# Patient Record
Sex: Male | Born: 1945
Health system: Southern US, Community
[De-identification: ages and names within clinical notes are randomized; demographics above are authoritative.]

## PROBLEM LIST (undated history)

## (undated) DIAGNOSIS — I4821 Permanent atrial fibrillation: Secondary | ICD-10-CM

## (undated) DIAGNOSIS — E785 Hyperlipidemia, unspecified: Secondary | ICD-10-CM

## (undated) DIAGNOSIS — I2584 Coronary atherosclerosis due to calcified coronary lesion: Secondary | ICD-10-CM

## (undated) DIAGNOSIS — I10 Essential (primary) hypertension: Secondary | ICD-10-CM

## (undated) DIAGNOSIS — I700 Atherosclerosis of aorta: Secondary | ICD-10-CM

## (undated) DIAGNOSIS — G40909 Epilepsy, unspecified, not intractable, without status epilepticus: Secondary | ICD-10-CM

## (undated) DIAGNOSIS — I639 Cerebral infarction, unspecified: Secondary | ICD-10-CM

## (undated) DIAGNOSIS — N183 Chronic kidney disease, stage 3 unspecified: Secondary | ICD-10-CM

## (undated) DIAGNOSIS — I2510 Atherosclerotic heart disease of native coronary artery without angina pectoris: Secondary | ICD-10-CM

## (undated) DIAGNOSIS — M1990 Unspecified osteoarthritis, unspecified site: Secondary | ICD-10-CM

## (undated) HISTORY — PX: TOTAL KNEE ARTHROPLASTY: SHX125

## (undated) HISTORY — DX: Cerebral infarction, unspecified: I63.9

## (undated) HISTORY — DX: Unspecified osteoarthritis, unspecified site: M19.90

## (undated) HISTORY — DX: Permanent atrial fibrillation: I48.21

## (undated) HISTORY — DX: Atherosclerotic heart disease of native coronary artery without angina pectoris: I25.10

## (undated) HISTORY — DX: Chronic kidney disease, stage 3 (moderate): N18.3

## (undated) HISTORY — DX: Chronic kidney disease, stage 3 unspecified: N18.30

## (undated) HISTORY — DX: Hyperlipidemia, unspecified: E78.5

## (undated) HISTORY — DX: Epilepsy, unspecified, not intractable, without status epilepticus: G40.909

## (undated) HISTORY — DX: Atherosclerosis of aorta: I70.0

## (undated) HISTORY — DX: Essential (primary) hypertension: I10

## (undated) HISTORY — DX: Coronary atherosclerosis due to calcified coronary lesion: I25.84

---

## 2000-08-06 ENCOUNTER — Other Ambulatory Visit: Admission: RE | Admit: 2000-08-06 | Discharge: 2000-08-06 | Payer: Self-pay | Admitting: General Surgery

## 2000-08-07 ENCOUNTER — Ambulatory Visit (HOSPITAL_COMMUNITY): Admission: RE | Admit: 2000-08-07 | Discharge: 2000-08-07 | Payer: Self-pay | Admitting: Internal Medicine

## 2000-08-07 ENCOUNTER — Encounter: Payer: Self-pay | Admitting: Internal Medicine

## 2000-08-28 ENCOUNTER — Other Ambulatory Visit: Admission: RE | Admit: 2000-08-28 | Discharge: 2000-08-28 | Payer: Self-pay | Admitting: General Surgery

## 2002-05-05 ENCOUNTER — Encounter: Payer: Self-pay | Admitting: Orthopaedic Surgery

## 2002-05-05 ENCOUNTER — Ambulatory Visit (HOSPITAL_COMMUNITY): Admission: RE | Admit: 2002-05-05 | Discharge: 2002-05-05 | Payer: Self-pay | Admitting: Orthopaedic Surgery

## 2002-07-10 ENCOUNTER — Ambulatory Visit (HOSPITAL_COMMUNITY): Admission: RE | Admit: 2002-07-10 | Discharge: 2002-07-10 | Payer: Self-pay | Admitting: Orthopaedic Surgery

## 2003-10-18 ENCOUNTER — Emergency Department (HOSPITAL_COMMUNITY): Admission: EM | Admit: 2003-10-18 | Discharge: 2003-10-18 | Payer: Self-pay

## 2004-04-05 ENCOUNTER — Ambulatory Visit (HOSPITAL_COMMUNITY): Admission: RE | Admit: 2004-04-05 | Discharge: 2004-04-05 | Payer: Self-pay | Admitting: Orthopaedic Surgery

## 2005-01-31 ENCOUNTER — Ambulatory Visit (HOSPITAL_COMMUNITY): Admission: RE | Admit: 2005-01-31 | Discharge: 2005-01-31 | Payer: Self-pay | Admitting: Family Medicine

## 2005-02-08 ENCOUNTER — Ambulatory Visit (HOSPITAL_COMMUNITY): Admission: RE | Admit: 2005-02-08 | Discharge: 2005-02-08 | Payer: Self-pay | Admitting: Family Medicine

## 2005-05-05 ENCOUNTER — Ambulatory Visit (HOSPITAL_COMMUNITY): Admission: RE | Admit: 2005-05-05 | Discharge: 2005-05-05 | Payer: Self-pay | Admitting: Orthopaedic Surgery

## 2005-05-18 ENCOUNTER — Encounter: Admission: RE | Admit: 2005-05-18 | Discharge: 2005-05-18 | Payer: Self-pay | Admitting: Orthopaedic Surgery

## 2005-05-29 ENCOUNTER — Encounter (HOSPITAL_COMMUNITY): Admission: RE | Admit: 2005-05-29 | Discharge: 2005-06-28 | Payer: Self-pay | Admitting: Orthopaedic Surgery

## 2005-06-09 ENCOUNTER — Encounter: Admission: RE | Admit: 2005-06-09 | Discharge: 2005-06-09 | Payer: Self-pay | Admitting: Orthopaedic Surgery

## 2006-01-01 ENCOUNTER — Ambulatory Visit: Payer: Self-pay | Admitting: Orthopedic Surgery

## 2006-01-18 ENCOUNTER — Ambulatory Visit: Payer: Self-pay | Admitting: Orthopedic Surgery

## 2006-04-02 ENCOUNTER — Ambulatory Visit: Payer: Self-pay | Admitting: Orthopedic Surgery

## 2006-07-04 ENCOUNTER — Ambulatory Visit: Payer: Self-pay | Admitting: Orthopedic Surgery

## 2006-10-31 DIAGNOSIS — E119 Type 2 diabetes mellitus without complications: Secondary | ICD-10-CM | POA: Insufficient documentation

## 2006-11-01 ENCOUNTER — Ambulatory Visit: Payer: Self-pay | Admitting: Orthopedic Surgery

## 2006-12-05 ENCOUNTER — Ambulatory Visit: Payer: Self-pay | Admitting: Orthopedic Surgery

## 2006-12-05 DIAGNOSIS — M5430 Sciatica, unspecified side: Secondary | ICD-10-CM | POA: Insufficient documentation

## 2006-12-05 DIAGNOSIS — M1710 Unilateral primary osteoarthritis, unspecified knee: Secondary | ICD-10-CM | POA: Insufficient documentation

## 2006-12-05 DIAGNOSIS — M25569 Pain in unspecified knee: Secondary | ICD-10-CM | POA: Insufficient documentation

## 2007-06-17 ENCOUNTER — Ambulatory Visit: Payer: Self-pay | Admitting: Orthopedic Surgery

## 2007-06-17 DIAGNOSIS — M25469 Effusion, unspecified knee: Secondary | ICD-10-CM | POA: Insufficient documentation

## 2007-07-22 ENCOUNTER — Ambulatory Visit: Payer: Self-pay | Admitting: Orthopedic Surgery

## 2007-07-24 ENCOUNTER — Encounter: Payer: Self-pay | Admitting: Orthopedic Surgery

## 2007-07-25 ENCOUNTER — Encounter: Payer: Self-pay | Admitting: Orthopedic Surgery

## 2007-07-29 ENCOUNTER — Encounter: Payer: Self-pay | Admitting: Orthopedic Surgery

## 2007-07-30 ENCOUNTER — Ambulatory Visit: Payer: Self-pay | Admitting: Orthopedic Surgery

## 2007-07-30 ENCOUNTER — Inpatient Hospital Stay (HOSPITAL_COMMUNITY): Admission: RE | Admit: 2007-07-30 | Discharge: 2007-08-02 | Payer: Self-pay | Admitting: Orthopedic Surgery

## 2007-07-30 ENCOUNTER — Telehealth: Payer: Self-pay | Admitting: Orthopedic Surgery

## 2007-08-02 ENCOUNTER — Encounter: Payer: Self-pay | Admitting: Orthopedic Surgery

## 2007-08-05 ENCOUNTER — Encounter: Payer: Self-pay | Admitting: Orthopedic Surgery

## 2007-08-05 ENCOUNTER — Telehealth: Payer: Self-pay | Admitting: Orthopedic Surgery

## 2007-08-06 ENCOUNTER — Encounter: Payer: Self-pay | Admitting: Orthopedic Surgery

## 2007-08-07 ENCOUNTER — Telehealth: Payer: Self-pay | Admitting: Orthopedic Surgery

## 2007-08-08 ENCOUNTER — Encounter: Payer: Self-pay | Admitting: Orthopedic Surgery

## 2007-08-12 ENCOUNTER — Encounter: Payer: Self-pay | Admitting: Orthopedic Surgery

## 2007-08-13 ENCOUNTER — Ambulatory Visit: Payer: Self-pay | Admitting: Orthopedic Surgery

## 2007-08-13 DIAGNOSIS — Z96659 Presence of unspecified artificial knee joint: Secondary | ICD-10-CM | POA: Insufficient documentation

## 2007-08-14 ENCOUNTER — Telehealth: Payer: Self-pay | Admitting: Orthopedic Surgery

## 2007-08-15 ENCOUNTER — Encounter: Payer: Self-pay | Admitting: Orthopedic Surgery

## 2007-09-12 ENCOUNTER — Ambulatory Visit: Payer: Self-pay | Admitting: Orthopedic Surgery

## 2007-10-01 ENCOUNTER — Encounter: Payer: Self-pay | Admitting: Orthopedic Surgery

## 2007-10-01 ENCOUNTER — Telehealth: Payer: Self-pay | Admitting: Orthopedic Surgery

## 2007-10-24 ENCOUNTER — Ambulatory Visit: Payer: Self-pay | Admitting: Orthopedic Surgery

## 2007-11-13 ENCOUNTER — Encounter: Payer: Self-pay | Admitting: Orthopedic Surgery

## 2007-11-15 ENCOUNTER — Telehealth: Payer: Self-pay | Admitting: Orthopedic Surgery

## 2008-01-22 ENCOUNTER — Ambulatory Visit: Payer: Self-pay | Admitting: Orthopedic Surgery

## 2008-10-14 ENCOUNTER — Ambulatory Visit: Payer: Self-pay | Admitting: Orthopedic Surgery

## 2008-10-22 ENCOUNTER — Encounter: Payer: Self-pay | Admitting: Orthopedic Surgery

## 2009-03-10 ENCOUNTER — Ambulatory Visit: Payer: Self-pay | Admitting: Orthopedic Surgery

## 2009-07-12 ENCOUNTER — Ambulatory Visit: Payer: Self-pay | Admitting: Orthopedic Surgery

## 2009-07-12 ENCOUNTER — Encounter (INDEPENDENT_AMBULATORY_CARE_PROVIDER_SITE_OTHER): Payer: Self-pay | Admitting: *Deleted

## 2009-07-12 DIAGNOSIS — M1710 Unilateral primary osteoarthritis, unspecified knee: Secondary | ICD-10-CM

## 2009-07-12 DIAGNOSIS — IMO0002 Reserved for concepts with insufficient information to code with codable children: Secondary | ICD-10-CM | POA: Insufficient documentation

## 2009-07-15 ENCOUNTER — Encounter (INDEPENDENT_AMBULATORY_CARE_PROVIDER_SITE_OTHER): Payer: Self-pay | Admitting: *Deleted

## 2009-07-20 ENCOUNTER — Encounter (INDEPENDENT_AMBULATORY_CARE_PROVIDER_SITE_OTHER): Payer: Self-pay | Admitting: *Deleted

## 2009-07-23 ENCOUNTER — Encounter: Payer: Self-pay | Admitting: Orthopedic Surgery

## 2009-07-23 ENCOUNTER — Telehealth: Payer: Self-pay | Admitting: Orthopedic Surgery

## 2009-07-23 ENCOUNTER — Encounter (INDEPENDENT_AMBULATORY_CARE_PROVIDER_SITE_OTHER): Payer: Self-pay | Admitting: *Deleted

## 2009-08-05 ENCOUNTER — Encounter: Payer: Self-pay | Admitting: Orthopedic Surgery

## 2009-08-09 ENCOUNTER — Encounter: Payer: Self-pay | Admitting: Orthopedic Surgery

## 2009-08-10 ENCOUNTER — Inpatient Hospital Stay (HOSPITAL_COMMUNITY): Admission: RE | Admit: 2009-08-10 | Discharge: 2009-08-13 | Payer: Self-pay | Admitting: Orthopedic Surgery

## 2009-08-10 ENCOUNTER — Ambulatory Visit: Payer: Self-pay | Admitting: Orthopedic Surgery

## 2009-08-11 ENCOUNTER — Encounter: Payer: Self-pay | Admitting: Orthopedic Surgery

## 2009-08-16 ENCOUNTER — Telehealth: Payer: Self-pay | Admitting: Orthopedic Surgery

## 2009-08-19 ENCOUNTER — Telehealth: Payer: Self-pay | Admitting: Orthopedic Surgery

## 2009-08-24 ENCOUNTER — Ambulatory Visit: Payer: Self-pay | Admitting: Orthopedic Surgery

## 2009-08-30 ENCOUNTER — Encounter: Payer: Self-pay | Admitting: Orthopedic Surgery

## 2009-09-01 ENCOUNTER — Ambulatory Visit: Payer: Self-pay | Admitting: Orthopedic Surgery

## 2009-09-06 ENCOUNTER — Telehealth: Payer: Self-pay | Admitting: Orthopedic Surgery

## 2009-09-20 ENCOUNTER — Ambulatory Visit: Payer: Self-pay | Admitting: Orthopedic Surgery

## 2009-09-28 ENCOUNTER — Telehealth: Payer: Self-pay | Admitting: Orthopedic Surgery

## 2009-10-05 ENCOUNTER — Encounter: Payer: Self-pay | Admitting: Orthopedic Surgery

## 2009-10-11 ENCOUNTER — Ambulatory Visit: Payer: Self-pay | Admitting: Orthopedic Surgery

## 2009-10-12 ENCOUNTER — Encounter (INDEPENDENT_AMBULATORY_CARE_PROVIDER_SITE_OTHER): Payer: Self-pay | Admitting: *Deleted

## 2009-10-13 ENCOUNTER — Encounter: Payer: Self-pay | Admitting: Orthopedic Surgery

## 2009-10-18 ENCOUNTER — Encounter: Payer: Self-pay | Admitting: Orthopedic Surgery

## 2009-11-04 ENCOUNTER — Ambulatory Visit: Payer: Self-pay | Admitting: Orthopedic Surgery

## 2009-11-26 ENCOUNTER — Encounter (INDEPENDENT_AMBULATORY_CARE_PROVIDER_SITE_OTHER): Payer: Self-pay | Admitting: *Deleted

## 2009-11-29 ENCOUNTER — Telehealth: Payer: Self-pay | Admitting: Orthopedic Surgery

## 2009-12-01 ENCOUNTER — Encounter: Payer: Self-pay | Admitting: Orthopedic Surgery

## 2010-02-03 ENCOUNTER — Ambulatory Visit
Admission: RE | Admit: 2010-02-03 | Discharge: 2010-02-03 | Payer: Self-pay | Source: Home / Self Care | Attending: Orthopedic Surgery | Admitting: Orthopedic Surgery

## 2010-02-20 ENCOUNTER — Encounter: Payer: Self-pay | Admitting: Orthopaedic Surgery

## 2010-03-01 NOTE — Miscellaneous (Signed)
Summary: voltaren gel requires ins. authorization  Clinical Lists Changes   I called insurance, case number is [id number], they will be faxing info for dr to fill out for Voltaren gel  Rx was in fax today 11/29/09.

## 2010-03-01 NOTE — Miscellaneous (Signed)
Summary: pre-cert information in-patient surgery   Clinical Lists Changes  Contact to insurer [employer] re: in-patient surgery scheduled 08/10/09, total knee arthroplasty CPT 27447, ICD9 dx 715.16, 715.96.  Spoke w/Teresa B - transferred to [facility] - reviewed; Pre-approved with Auth # [id number].  Clinicals would be needed only if in-patient for >7 days.

## 2010-03-01 NOTE — Progress Notes (Signed)
Summary: call from therapist / Home physical therapy frequency note  Phone Note Call from Patient   Summary of Call: Brendan Scott from [place] called to relay that she does home therapy for [name] on the weekends.  States saw Mr. Reesor and set his frequency up to 5X per week X2 wks, then 3X per week X2 wks, due to" knee being really tight."  If any questions or additional information, her direct ph# is [phone]. Initial call taken by: Cammie Sickle,  August 16, 2009 2:29 PM

## 2010-03-01 NOTE — Assessment & Plan Note (Signed)
Summary: 3 WK RE-CK/XRAY/LT TKA 08/10/09/BCBS/CAF   Visit Type:  post op  Referring Provider:  self  CC:  left total knee replacement.  History of Present Illness: I saw Brendan Scott in the office today for a 3 week  followup visit.  He is a 65 years old man with the complaint of:  left knee replacement  Xrays today.  DOS 08-10-09. Left total knee replacement.  Medications:  Oxycodone 5  1 every 4 hours , Robaxin 500mg . Needs refills.  He states he has some soreness and some shooting pains. he is fine during the day he has increased pain at night an occasional sharp pain run through the front of the knee  His surgery was contacted by posterior cortical fracture the tibia and tilting of his tibial tray.  He is currently about 92 of flexion with a 8 flexion contracture on the LEFT whereas his RIGHT total knee replacement has 108 of flexion and full extension  I think this cortical fracture and tilting is preventing his motion and giving him soreness and pain.  I will like to give it another month to see where he settles out before recommending any further intervention such as a long stem revision of the tibial tray with appropriate tilt.  Followup in one month no x-ray needed   Allergies: No Known Drug Allergies   Impression & Recommendations:  Problem # 1:  TOTAL KNEE FOLLOW-UP (ICD-V43.65) Assessment Improved  AP lateral and patellofemoral view LEFT knee  The tibial tray is tilted with posterior cortical breakthrough otherwise alignment is normal  Cement extravasation noted in the posterior soft tissues of the gastroc area.  Orders: Post-Op Check (69629) Knee x-ray,  3 views (52841)  Problem # 2:  OSTEOARTHRITIS, KNEE, LEFT (ICD-715.96) Assessment: Improved  His updated medication list for this problem includes:    Vicodin 5-500 Mg Tabs (Hydrocodone-acetaminophen) .Marland Kitchen... 1 by mouth q 6 prn    Diclofenac Sodium 50 Mg Tbec (Diclofenac sodium) .Marland Kitchen... 1 by mouth two  times a day    Oxycodone-acetaminophen 5-325 Mg Tabs (Oxycodone-acetaminophen) .Marland Kitchen... 1 q 4 as needed pain    Robaxin 500 Mg Tabs (Methocarbamol) .Marland Kitchen... 1 by mouth tid    Celebrex 200 Mg Caps (Celecoxib) .Marland Kitchen... 1 by mouth qd  Orders: Post-Op Check (32440) Knee x-ray,  3 views (10272)  Patient Instructions: 1)  Please schedule a follow-up appointment in 1 month. Prescriptions: CELEBREX 200 MG CAPS (CELECOXIB) 1 by mouth qd  #30 x 2   Entered and Authorized by:   Fuller Canada MD   Signed by:   Fuller Canada MD on 10/11/2009   Method used:   Print then Give to Patient   RxID:   [id number] OXYCODONE-ACETAMINOPHEN 5-325 MG TABS (OXYCODONE-ACETAMINOPHEN) 1 q 4 as needed pain  #90 x 0   Entered and Authorized by:   Fuller Canada MD   Signed by:   Fuller Canada MD on 10/11/2009   Method used:   Print then Give to Patient   RxID:   [id number]

## 2010-03-01 NOTE — Miscellaneous (Signed)
Visit Type:  Follow-up Referring Provider:  self  CC:  left knee pain .  History of Present Illness: I saw Brendan Scott in the office today for a followup visit.  He is a 65 years old man with the complaint of:  DX: Left knee pain, OA fluid.  Treatment: Aspirations injections 10/14/08 last aspiration and injection.  MEDS:  Vicodin 5 and Diclofenac, needs refills on both.  Complaints: fluid left knee.  Today, I am ready for you to do this one   This is a 65 year old male had a RIGHT total knee arthroplasty did well always knew he would have to have the LEFT one done as well.  I have aspirated and injected his knee on several occasions over the last few months.  He is also on pain medicine and anti-inflammatories.  If he wishes to have surgery on the LEFT knee now for persistent severe pain on the medial aspect of his knee.       Current Medications (verified): 1)  Diclofenac 2)  Glimepride 3)  Vicodin 5-500 Mg Tabs (Hydrocodone-Acetaminophen) .Marland Kitchen.. 1 By Mouth Q 6 Prn 4)  Diclofenac Sodium 50 Mg Tbec (Diclofenac Sodium) .Marland Kitchen.. 1 By Mouth Two Times A Day  Allergies (verified): No Known Drug Allergies  Past History:  Past Medical History: Last updated: 10/31/2006 Diabetes arthritis joint pain/knees breathing problems sometime  Past Surgical History: Last updated: 10/31/2006 knee surgery (not done by Dr. Romeo Apple ear surgery  Risk Factors: Smoking Status: never (07/22/2007)  Review of Systems Musculoskeletal:  Complains of joint pain, swelling, and stiffness.  The review of systems is negative for Constitutional, Cardiovascular, Respiratory, Gastrointestinal, Genitourinary, Neurologic, Endocrine, Psychiatric, Skin, HEENT, Immunology, and Hemoatologic.  Physical Exam  GEN : Normal appearance medium build  Patient very alert awake oriented happy pleasant  Normal sensation in his lower extremities  Normal pulse and effusion both limbs  Skin warm dry and intact  well-healed RIGHT knee incision  Lymph nodes negative both legs   gait pattern favors the LEFT leg total knee gait on the RIGHT  LEFT knee inspection reveals large joint effusion flexion of about 100  although the knee is stable he has medial joint line tenderness.  Alignment is varus strength is normal  RIGHT knee range of motion is 115 knee is stable strength is normal there is no effusion or tenderness.   The upper extremities have normal appearance, ROM, strength and stability.    Impression & Recommendations:  Problem # 1:  KNEE PAIN, LEFT (ICD-719.46) Assessment Deteriorated  His updated medication list for this problem includes:    Vicodin 5-500 Mg Tabs (Hydrocodone-acetaminophen) .Marland Kitchen... 1 by mouth q 6 prn    Diclofenac Sodium 50 Mg Tbec (Diclofenac sodium) .Marland Kitchen... 1 by mouth two times a day     Problem # 3:  OSTEOARTHRITIS, KNEE, LEFT (ICD-715.96) Assessment: Deteriorated the most recent x-ray, x-ray from about 6 months ago shows varus arthritis with near-complete loss of medial joint space.  he will have a LEFT total knee arthroplasty     Patient Instructions: 1)  Informed consent process: I have discussed the procedure with the patient. I have answered their questions. The risks of bleeding, infection, nerve and vascualr injury have been discussed. The diagnosis and reason for surgery have been explained. The patient demonstrates understanding of this discussion. Specific to this procedure risks include:  2)  Stiffness 3)  Pain 4)  Infection-can lead to further surgery and amputation 5)  blood clot and embolus 6)  DOS 08/10/09 7)  I will call you with your preop appt, [facility] is where you will go, take packet with you. 8)  Post op 1 08/24/09 9)  OOW 3 MOS

## 2010-03-01 NOTE — Letter (Signed)
Summary: [employer] disab form  [employer] disab form   Imported By: Cammie Sickle 08/06/2009 08:49:55  _____________________________________________________________________  External Attachment:    Type:   Image     Comment:   External Document

## 2010-03-01 NOTE — Letter (Signed)
Summary: [employer] disab form [facility]  [employer] dis[employer] form [facility]   Imported By: Cammie Sickle 08/11/2009 19:10:25  _____________________________________________________________________  External Attachment:    Type:   Image     Comment:   External Document

## 2010-03-01 NOTE — Progress Notes (Signed)
Summary: call from patient ?continue CPM  Phone Note Call from Patient   Caller: Patient Summary of Call: Patient called w/ questionre: CPM machine - still to continue? If so how long?  States his insurance no longer covering and self pay charge would be >$100 p/day.  His post op #1 appointment is Tues, 08/24/09.   Initial call taken by: Cammie Sickle,  August 19, 2009 9:08 AM  Follow-up for Phone Call        postop CPM unless he wants to pay for it Follow-up by: Fuller Canada MD,  August 19, 2009 9:33 AM  Additional Follow-up for Phone Call Additional follow up Details #1::        advised patient.  states he has already spoken w/[employer] and also checked w/therapist about returning CPM - they will be picking it up. Additional Follow-up by: Cammie Sickle,  August 19, 2009 12:32 PM

## 2010-03-01 NOTE — Medication Information (Signed)
Summary: Therapist, occupational for [employer] approval for Voltaren   Imported By: Jacklynn Ganong 12/01/2009 14:45:27  _____________________________________________________________________  External Attachment:    Type:   Image     Comment:   External Document

## 2010-03-01 NOTE — Letter (Signed)
Summary: Insurance authorization for [employer] authorization for [employer]   Imported By: Jacklynn Ganong 08/10/2009 15:33:38  _____________________________________________________________________  External Attachment:    Type:   Image     Comment:   External Document

## 2010-03-01 NOTE — Letter (Signed)
Summary: surgery order LT TKA  surgery order LT TKA   Imported By: Cammie Sickle 07/24/2009 13:35:58  _____________________________________________________________________  External Attachment:    Type:   Image     Comment:   External Document

## 2010-03-01 NOTE — Assessment & Plan Note (Signed)
Summary: POST OP 1/TKA LT KNEE/SURG 08/10/09/BCBS/CAF   Visit Type:  post op Referring Provider:  self  CC:  left knee.  History of Present Illness: 65 year old male postop visit #1 status post LEFT total knee replacement on July 12  Intraoperative complication of tibial perforation  Patient comes in today with range of motion and therapy 0-95 but therapist saying he is tight  Knee is slightly swollen  I was only able to get his flexion to 75  I do not see signs of infection  DOS 08-10-09. Left total knee replacement.  Medications:  Oxycodone 5  1 every 4 hours , Robaxin 500mg , Oxycodone IR 5mg . 1-2 q 4 as needed for breakthru   Allergies: No Known Drug Allergies   Impression & Recommendations:  Problem # 1:  OSTEOARTHRITIS, KNEE, LEFT (ICD-715.96)  His updated medication list for this problem includes:    Vicodin 5-500 Mg Tabs (Hydrocodone-acetaminophen) .Marland Kitchen... 1 by mouth q 6 prn    Diclofenac Sodium 50 Mg Tbec (Diclofenac sodium) .Marland Kitchen... 1 by mouth two times a day  Orders: Post-Op Check (01027)  Problem # 2:  TOTAL KNEE FOLLOW-UP (ICD-V43.65) Assessment: Comment Only  I want to see him in a week x-rays flexion is improving  Orders: Post-Op Check (25366)  Patient Instructions: 1)  Please schedule a follow-up appointment in 1 week.

## 2010-03-01 NOTE — Miscellaneous (Signed)
Summary: Brendan Scott Patient progress report  Brendan Scott Patient progress report   Imported By: Jacklynn Ganong 08/31/2009 15:10:22  _____________________________________________________________________  External Attachment:    Type:   Image     Comment:   External Document

## 2010-03-01 NOTE — Medication Information (Signed)
Summary: Prior authorization request   Prior authorization request   Imported By: Jacklynn Ganong 12/01/2009 14:47:00  _____________________________________________________________________  External Attachment:    Type:   Image     Comment:   External Document

## 2010-03-01 NOTE — Assessment & Plan Note (Signed)
Summary: 1 WK RE-CK/POST OP TKA 08/10/09/BCBS/CAF   Visit Type:  Follow-up Referring Provider:  self  CC:  post op left knee TKA.  History of Present Illness: 65 year old male postop visit 2 status post LEFT total knee replacement on July 12  Intraoperative complication of tibial perforation  Patient comes in today with range of motion and therapy 0-95 but therapist saying he is tight  DOS 08-10-09. Left total knee replacement.  Medications:  Oxycodone 5  1 every 4 hours , Robaxin 500mg , Oxycodone IR 5mg . 1-2 q 4 as needed for breakthru  Today is a one week recheck after PT, in home PT with [place].  the patient is made some improvement his flexion is now 102 he still tight.  He still has some tightness in extension but when he relaxes he has only a 3 loss of extension  He has some mild knee swelling which should improve and that will allow his motion to improve he is walking 3-400 feet.  X-ray next visit continue passive extension exercises in the seated position as well as in the prone position and exercises were explained to the patient  Allergies: No Known Drug Allergies   Impression & Recommendations:  Problem # 1:  TOTAL KNEE FOLLOW-UP (ICD-V43.65) Assessment Comment Only  Orders: Post-Op Check (01601)  Problem # 2:  OSTEOARTHRITIS, KNEE, LEFT (ICD-715.96) Assessment: Comment Only  His updated medication list for this problem includes:    Vicodin 5-500 Mg Tabs (Hydrocodone-acetaminophen) .Marland Kitchen... 1 by mouth q 6 prn    Diclofenac Sodium 50 Mg Tbec (Diclofenac sodium) .Marland Kitchen... 1 by mouth two times a day  Orders: Post-Op Check (09323)  Patient Instructions: 1)  3 weeks  2)  xrays TKA

## 2010-03-01 NOTE — Progress Notes (Signed)
Summary: wants prescription for Celebrex  Phone Note Call from Patient   Summary of Call: Brendan Scott (05/08/1945) says his left knee hurts real bad at night, like a toothache.  Asked if you will give him a prescription for Celebrex. Uses [facility] in [place] His # [phone] Initial call taken by: Jacklynn Ganong,  September 28, 2009 9:59 AM  Follow-up for Phone Call        celebrex 200 mg q h s   # 30  Follow-up by: Fuller Canada MD,  September 29, 2009 8:46 AM    New/Updated Medications: CELEBREX 200 MG CAPS (CELECOXIB) 1 by mouth qd Prescriptions: CELEBREX 200 MG CAPS (CELECOXIB) 1 by mouth qd  #30 x 2   Entered by:   Ether Griffins   Authorized by:   Fuller Canada MD   Signed by:   Ether Griffins on 09/29/2009   Method used:   Faxed to ...       [facility] [address] [facility] (retail)       [address]       [place], [place]  [address]       Ph: [phone]       Fax: [phone]   RxID:   [id number]

## 2010-03-01 NOTE — Progress Notes (Signed)
Summary: PT/INR  Phone Note Other Incoming   Summary of Call: Daryel November called about Brendan Scott (10/05/1945) PT 12.8    INR 1.3    Taking 2mg  Coumadin daily  [facility] # [phone] of [facility] at [place] [phone]  Initial call taken by: Jacklynn Ganong,  August 16, 2009 3:07 PM

## 2010-03-01 NOTE — Miscellaneous (Signed)
Summary: [facility] Plan of care  [facility] of care   Imported By: Jacklynn Ganong 10/12/2009 13:28:48  _____________________________________________________________________  External Attachment:    Type:   Image     Comment:   External Document

## 2010-03-01 NOTE — Assessment & Plan Note (Signed)
Summary: 1 M RE-CK/TKA LT/SURG 08/10/09/BCBS/CAF   Visit Type:  Follow-up Referring Provider:  self  CC:  left knee replacement.  History of Present Illness:  DOS 08-10-09.   Left total knee replacement.  Medications:  Oxycodone 5  1 every 4 hours , Robaxin 500mg .   He complains of pain in the front of his knee with an occasional sharp sensation in the front of the patella treated down to the end of the incision.  He is ready to go back to work.  Examination reveals range of motion of 3-100 , knee, stable to anterior, posterior, drawer testing collateral ligament testing.  No tenderness to palpation. He does get pain at terminal flexion in the front of the knee and there is some crepitance from scarring under the patella    Allergies: No Known Drug Allergies   Impression & Recommendations:  Problem # 1:  TOTAL KNEE FOLLOW-UP (ICD-V43.65)  Orders: Post-Op Check (16109)  Problem # 2:  KNEE PAIN, LEFT (ICD-719.46) Assessment: Deteriorated  The following medications were removed from the medication list:    Diclofenac Sodium 50 Mg Tbec (Diclofenac sodium) .Marland Kitchen... 1 by mouth two times a day    Oxycodone-acetaminophen 5-325 Mg Tabs (Oxycodone-acetaminophen) .Marland Kitchen... 1 q 4 as needed pain    Robaxin 500 Mg Tabs (Methocarbamol) .Marland Kitchen... 1 by mouth tid His updated medication list for this problem includes:    Vicodin 5-500 Mg Tabs (Hydrocodone-acetaminophen) .Marland Kitchen... 1 by mouth q 6 prn    Celebrex 200 Mg Caps (Celecoxib) .Marland Kitchen... 1 by mouth qd  Orders: Post-Op Check (60454)  Medications Added to Medication List This Visit: 1)  Voltaren 1 % Gel (Diclofenac sodium) .... 5 pak   4gms 4 times a day  Patient Instructions: 1)  Apply voltaren gel 4 times a day  2)  3 month f/u  Prescriptions: VOLTAREN 1 % GEL (DICLOFENAC SODIUM) 5 pak   4gms 4 times a day  #5 x 5   Entered and Authorized by:   Fuller Canada MD   Signed by:   Fuller Canada MD on 11/04/2009   Method used:    Handwritten   RxID:   [id number]

## 2010-03-01 NOTE — Assessment & Plan Note (Signed)
Summary: wants fluid drawn from left knee/bcbs/bsf   Visit Type:  Follow-up Referring Provider:  self  CC:  fluid left knee.  History of Present Illness: I saw Brendan Scott in the office today for a followup visit.  He is a 65 years old man with the complaint of:  DX: Left knee pain, OA fluid.  Treatment: Aspirations injections 10/14/08 last aspiration and injection.  MEDS:  Vicodin 5 and Diclofenac, needs refills on both.  Complaints: fluid left knee.  Today, scheduled for: fluid removal on left knee.  Fluid has been back for a week, no injury left knee.  ROS: Still has soreness right knee anterior, not ready for left knee surgery TKA.  LEFT knee, large joint effusion.  Aspiration and injection LEFT knee large amount of clear fluid.  Patient will like to have knee replacement later this year.     Allergies: No Known Drug Allergies   Impression & Recommendations:  Problem # 1:  KNEE PAIN, LEFT (ICD-719.46) Assessment Deteriorated  aspirate and inject left knee   200cc  Verbal consent was obtained. The knee was prepped with alcohol and ethyl chloride. 1 cc of depomedrol 40mg /cc and 4 cc of lidocaine 1% was injected. there were no complications.  His updated medication list for this problem includes:    Vicodin 5-500 Mg Tabs (Hydrocodone-acetaminophen) .Marland Kitchen... 1 by mouth q 6 prn    Diclofenac Sodium 50 Mg Tbec (Diclofenac sodium) .Marland Kitchen... 1 by mouth two times a day  Orders: Joint Aspirate / Injection, Large (20610) Depo- Medrol 40mg  (J1030)  Problem # 2:  OSTEOARTHRITIS, LOWER LEG (ICD-715.16) Assessment: Deteriorated  Medications Added to Medication List This Visit: 1)  Diclofenac Sodium 50 Mg Tbec (Diclofenac sodium) .Marland Kitchen.. 1 by mouth two times a day  Patient Instructions: 1)  Please schedule a follow-up appointment as needed. Prescriptions: DICLOFENAC SODIUM 50 MG TBEC (DICLOFENAC SODIUM) 1 by mouth two times a day  #60 x 5   Entered and Authorized by:    Fuller Canada MD   Signed by:   Fuller Canada MD on 03/10/2009   Method used:   Print then Give to Patient   RxID:   [id number] VICODIN 5-500 MG TABS (HYDROCODONE-ACETAMINOPHEN) 1 by mouth q 6 prn  #56 x 2   Entered and Authorized by:   Fuller Canada MD   Signed by:   Fuller Canada MD on 03/10/2009   Method used:   Print then Give to Patient   RxID:   [id number]

## 2010-03-01 NOTE — Letter (Signed)
Summary: Work [facility]  [address]  [place], [place] [address]   Phone: [phone]  Fax: [phone]    Today's Date: July 12, 2009  Name of Patient: Brendan Scott  The above named patient had a medical visit in our office today.  Please take this into consideration when reviewing the time away from work.    Special Instructions:   Arly.Keller ] To be off the remainder of today, returning to the normal work / school schedule on Monday,  July 19, 2009   Patient/Employee will be out of work as follows, secondary to surgery:   Start:        August 09, 2009   End/Estimated Return to work:  October 11, 2009   Sincerely,   Terrance Mass, MD

## 2010-03-01 NOTE — Letter (Signed)
Summary: Out of Work  [facility]  [address]  [place], [place] [address]   Phone: [phone]  Fax: [phone]    November 04, 2009   Employee:  HECTOR TAFT    To Whom It May Concern:   OK to return to work Oct 7 th   Sincerely,    Fuller Canada MD

## 2010-03-01 NOTE — Miscellaneous (Signed)
Summary: precert required for Celebrex  Clinical Lists Changes  value> called [phone] to get auth for Celebrex, they are faxing over some information to be filled out, the case number is [id number], ID number for pt is [id number]

## 2010-03-01 NOTE — Miscellaneous (Signed)
Summary: fxd info to [facility] and [facility] for surgery   Clinical Lists Changes

## 2010-03-01 NOTE — Progress Notes (Signed)
Summary: PATIENT Ph# change  Phone Note Other Incoming   Caller: [facility] Summary of Call: Selena Batten from [place] called (ph [phone]) - states unable to reach patient at 2 ph#'s listed.  Pulled new ph# from recent patient appt and forms, and called # & verified. **UPDATE** PATIENT PH# (Home) is [phone].  Updated in IDX system. Also notified Kim at [facility] surgery. Initial call taken by: Cammie Sickle,  July 23, 2009 11:48 AM

## 2010-03-01 NOTE — Medication Information (Signed)
Summary: [employer] Rx approval  [employer] Rx approval   Imported By: Cammie Sickle 10/22/2009 09:58:50  _____________________________________________________________________  External Attachment:    Type:   Image     Comment:   External Document

## 2010-03-01 NOTE — Assessment & Plan Note (Signed)
Summary: ? INFECTION  LEFT KNEE,SURG 08/10/09/BCBS/BSF   Visit Type:  Follow-up Referring Justyce Yeater:  self  CC:  left knee pain.  History of Present Illness: 65 year old male postop visit 2 status post LEFT total knee replacement on July 12  Intraoperative complication of tibial perforation  DOS 08-10-09. Left total knee replacement.  Medications:  Oxycodone 5  1 every 4 hours , Robaxin 500mg  ran out.   Today pt coming in complaining of lateral pain and swelling  Says he has swelling and soreness since last week, pain is lateral, no injury.  Uses ice everyday.  Basically he's been on his bicycle religiously several times during the day and afterwards she will get some lateral pain and swelling and he still has stiffness in his LEFT knee.  His knee looks fine in terms of any infection he does have some tenderness in the lateral joint line and lateral peripatellar region.  His range of motion is approximately 5-95 active and 0-95 passive.  His wound looks good there is no joint effusion  Recommend decreasing his time on the bike twice a day, use ibuprofen as he has been doing since his controlling the swelling.  He was told to come in if he is having any difficulties such as redness increased pain or increased swelling as he did this time.  Allergies: No Known Drug Allergies   Impression & Recommendations:  Problem # 1:  OSTEOARTHRITIS, KNEE, LEFT (ICD-715.96)  His updated medication list for this problem includes:    Vicodin 5-500 Mg Tabs (Hydrocodone-acetaminophen) .Marland Kitchen... 1 by mouth q 6 prn    Diclofenac Sodium 50 Mg Tbec (Diclofenac sodium) .Marland Kitchen... 1 by mouth two times a day    Oxycodone-acetaminophen 5-325 Mg Tabs (Oxycodone-acetaminophen) .Marland Kitchen... 1 q 4 as needed pain    Robaxin 500 Mg Tabs (Methocarbamol) .Marland Kitchen... 1 by mouth tid  Orders: Post-Op Check (11914)  Problem # 2:  TOTAL KNEE FOLLOW-UP (ICD-V43.65) Assessment: Comment Only  I do not see any signs of infection or  swelling or tenderness just stiffness in the knee.  This may be secondary to the tibial perforation and anterior tilt to the tibial component.  Like to give him a full course of rehabilitation to see where he winds up.  If he is not happy with his range of motion or he is having pain then a revision could be done if needed on the tibial component.  Orders: Post-Op Check [phone]  Medications Added to Medication List This Visit: 1)  Robaxin 500 Mg Tabs (Methocarbamol) .Marland Kitchen.. 1 by mouth tid  Patient Instructions: 1)  Please schedule a follow-up appointment in 3 weeks. 2)  Decrease time on the bike to 2 x a day  Prescriptions: ROBAXIN 500 MG TABS (METHOCARBAMOL) 1 by mouth tid  #90 x 0   Entered and Authorized by:   Fuller Canada MD   Signed by:   Fuller Canada MD on 09/20/2009   Method used:   Faxed to ...       [facility] [facility] (retail)       [address]       [place], [place]  [address]       Ph: [phone]       Fax: [phone]   RxID:   [id number]

## 2010-03-01 NOTE — Letter (Signed)
Summary: Work Excuse Updated  [facility]  [address]  [place], [place] [address]   Phone: [phone]  Fax: [phone]    Today's Date: July 15, 2009  Name of Patient: Brendan Scott  * UPDATED WORK STATUS NOTE *  The above named patient had a medical visit in our office on July 12, 2009.  Please take this into consideration when reviewing the time away from work/school.    Special Instructions:    [ X ] To be off secondary to surgery as follows:  Start:  August 09, 2009  End:   November 08, 2009 * or until further notice  ________________________________________________________________________   Sincerely,   Cammie Sickle

## 2010-03-01 NOTE — Assessment & Plan Note (Signed)
Summary: RE-CK LT KNEE/DISCUSS SURG/?NEW XRAY/BCBS/CAF   Visit Type:  Follow-up Referring Provider:  self  CC:  left knee pain .  History of Present Illness: I saw Brendan Scott in the office today for a followup visit.  He is a 65 years old man with the complaint of:  DX: Left knee pain, OA fluid.  Treatment: Aspirations injections 10/14/08 last aspiration and injection.  MEDS:  Vicodin 5 and Diclofenac, needs refills on both.  Complaints: fluid left knee.  Today, I am ready for you to do this one   This is a 65 year old male had a RIGHT total knee arthroplasty did well always newly would have to have the LEFT one done as well.  I have aspirated and injected his knee on several occasions over the last few months.  He is also on pain medicine and anti-inflammatories.  If he wishes to have surgery on the LEFT knee now for persistent severe pain on the medial aspect of his knee.       Current Medications (verified): 1)  Diclofenac 2)  Glimepride 3)  Vicodin 5-500 Mg Tabs (Hydrocodone-Acetaminophen) .Marland Kitchen.. 1 By Mouth Q 6 Prn 4)  Diclofenac Sodium 50 Mg Tbec (Diclofenac Sodium) .Marland Kitchen.. 1 By Mouth Two Times A Day  Allergies (verified): No Known Drug Allergies  Past History:  Past Medical History: Last updated: 10/31/2006 Diabetes arthritis joint pain/knees breathing problems sometime  Past Surgical History: Last updated: 10/31/2006 knee surgery (not done by Dr. Romeo Apple ear surgery  Risk Factors: Smoking Status: never (07/22/2007)  Review of Systems Musculoskeletal:  Complains of joint pain, swelling, and stiffness.  The review of systems is negative for Constitutional, Cardiovascular, Respiratory, Gastrointestinal, Genitourinary, Neurologic, Endocrine, Psychiatric, Skin, HEENT, Immunology, and Hemoatologic.  Physical Exam  Additional Exam:  Normal appearance medium build  Patient very alert awake oriented happy pleasant  Normal sensation in his lower  extremities  Normal pulse and effusion both limbs  Skin warm dry and intact well-healed RIGHT knee incision  Lymph nodes negative both legs   gait pattern favors the LEFT leg total knee gait on the RIGHT  LEFT knee inspection reveals large joint effusion flexion of about 100  although the knee is stable he has medial joint line tenderness.  Alignment is varus strength is normal  RIGHT knee range of motion is 115 knee is stable strength is normal there is no effusion or tenderness.     Impression & Recommendations:  Problem # 1:  KNEE PAIN, LEFT (ICD-719.46) Assessment Deteriorated  His updated medication list for this problem includes:    Vicodin 5-500 Mg Tabs (Hydrocodone-acetaminophen) .Marland Kitchen... 1 by mouth q 6 prn    Diclofenac Sodium 50 Mg Tbec (Diclofenac sodium) .Marland Kitchen... 1 by mouth two times a day  Problem # 2:  JOINT EFFUSION, KNEE (ICD-719.06) Assessment: Deteriorated  aspiration 50 cc LEFT knee joint no injection because of surgery outcome  Orders: Est. Patient Level IV (61607) Joint Aspirate / Injection, Large (20610)  Problem # 3:  OSTEOARTHRITIS, KNEE, LEFT (ICD-715.96) Assessment: Deteriorated the most recent x-ray, x-ray from about 6 months ago shows varus arthritis with near-complete loss of medial joint space. he will have a LEFT total knee arthroplasty  Orders: Est. Patient Level IV (37106)  Patient Instructions: 1)  Informed consent process: I have discussed the procedure with the patient. I have answered their questions. The risks of bleeding, infection, nerve and vascualr injury have been discussed. The diagnosis and reason for surgery have been explained. The patient demonstrates  understanding of this discussion. Specific to this procedure risks include:  2)  Stiffness 3)  Pain 4)  Infection-can lead to further surgery and amputation 5)  blood clot and embolus  6)  DOS 08/10/09 7)  I will call you with your preop appt, [facility] is  where you will go, take packet with you. 8)  Post op 1 08/24/09 9)  OOW 3 MOS

## 2010-03-01 NOTE — Miscellaneous (Signed)
Summary: Brendan Scott Patient progress report  Brendan Scott Patient progress report   Imported By: Jacklynn Ganong 09/06/2009 12:07:09  _____________________________________________________________________  External Attachment:    Type:   Image     Comment:   External Document

## 2010-03-01 NOTE — Letter (Signed)
Summary: [facility] op notes faxed  [facility] op notes faxed   Imported By: Cammie Sickle 08/16/2009 12:39:53  _____________________________________________________________________  External Attachment:    Type:   Image     Comment:   External Document

## 2010-03-01 NOTE — Medication Information (Signed)
Summary: RX Prior authorization request  RX Prior authorization request   Imported By: Jacklynn Ganong 10/13/2009 10:11:35  _____________________________________________________________________  External Attachment:    Type:   Image     Comment:   External Document

## 2010-03-01 NOTE — Letter (Signed)
Summary: BCBS Authorization notification inpatient surgery  BCBS Authorization notification inpatient surgery   Imported By: Cammie Sickle 08/10/2009 15:06:45  _____________________________________________________________________  External Attachment:    Type:   Image     Comment:   External Document

## 2010-03-01 NOTE — Progress Notes (Signed)
Summary: wants Oxycodone prescription  Phone Note Call from Patient   Summary of Call: Brendan Scott (09/26/1945) wants new prescriptin for Oxycodone His # [phone] Initial call taken by: Jacklynn Ganong,  November 29, 2009 11:22 AM  Follow-up for Phone Call        he is on hydrocodone if he needs a stronger med like that i need to see him Follow-up by: Fuller Canada MD,  November 29, 2009 11:33 AM  Additional Follow-up for Phone Call Additional follow up Details #1::        Carey said he was looking at the wrong  medicine, he will call the pharmacy for refill on Hydrocodone Additional Follow-up by: Jacklynn Ganong,  November 29, 2009 12:19 PM

## 2010-03-01 NOTE — Progress Notes (Signed)
Summary: call from patient req refill pain medication  Phone Note Call from Patient   Caller: Patient Summary of Call: Patient called requesting refill on pain medication. States 1pill  left of Oxycodone 5/ACL.  if need to change medication, his pharmacy is [facility] in [place].  Next scheduled appt 09/22/09. Initial call taken by: Cammie Sickle,  September 06, 2009 12:45 PM    New/Updated Medications: OXYCODONE-ACETAMINOPHEN 5-325 MG TABS (OXYCODONE-ACETAMINOPHEN) 1 q 4 as needed pain Prescriptions: OXYCODONE-ACETAMINOPHEN 5-325 MG TABS (OXYCODONE-ACETAMINOPHEN) 1 q 4 as needed pain  #90 x 0   Entered and Authorized by:   Fuller Canada MD   Signed by:   Fuller Canada MD on 09/07/2009   Method used:   Print then Give to Patient   RxID:   [id number]

## 2010-03-01 NOTE — Letter (Signed)
Summary: CPM correspondence /Medical Modalities  CPM correspondence /Medical Modalities   Imported By: Jacklynn Ganong 09/15/2009 08:15:01  _____________________________________________________________________  External Attachment:    Type:   Image     Comment:   External Document

## 2010-03-03 NOTE — Assessment & Plan Note (Signed)
Summary: 3 MO FOL-UP LEFT KNEE/BCBS/BSF   Visit Type:  Follow-up Referring Provider:  self  CC:  left knee replacement.  History of Present Illness: I saw Brendan Scott in the office today for a 3 month followup visit.  He is a 65 years old man with the complaint of:  left knee  DOS 08-10-09.   Left total knee replacement.  Medications:  Vicodin 5-500 Mg Tabs (Hydrocodone-acetaminophen) .Marland Kitchen... 1 by mouth q 6 as needed          Celebrex 200 Mg Caps (Celecoxib) .Marland Kitchen... 1 by mouth qd Voltaren 1 % Gel (Diclofenac sodium) .... 5 pak   4gms 4 times a day.  Complaints: Sometimeshe has a sharp pain and some pain at night. 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  He also had RIGHT knee replacement before the LEFT knee replacement  He would like his pain medication refilled  Examination reveals a range of motion of 5-105.  Knee is stable incision is healed.  He does have some pain RIGHT over the front of the incision at the inferior patella tendon.    Allergies: No Known Drug Allergies   Impression & Recommendations:  Problem # 1:  OSTEOARTHRITIS, KNEE, LEFT (ICD-715.96) Assessment Comment Only  His updated medication list for this problem includes:    Vicodin 5-500 Mg Tabs (Hydrocodone-acetaminophen) .Marland Kitchen... 1 by mouth q 6 prn    Celebrex 200 Mg Caps (Celecoxib) .Marland Kitchen... 1 by mouth qd  Orders: Est. Patient Level II ([address])  Patient Instructions: 1)  Please schedule a follow-up appointment in 6 months. 2)  xrays both TKA's Prescriptions: VICODIN 5-500 MG TABS (HYDROCODONE-ACETAMINOPHEN) 1 by mouth q 6 prn  #56 x 5   Entered and Authorized by:   Fuller Canada MD   Signed by:   Fuller Canada MD on 02/03/2010   Method used:   Print then Give to Patient   RxID:   [id number]    Orders Added: 1)  Est. Patient Level II [95621]

## 2010-04-16 LAB — CBC
HCT: 33 % — ABNORMAL LOW (ref 39.0–52.0)
MCH: 27.5 pg (ref 26.0–34.0)
RBC: 3.99 MIL/uL — ABNORMAL LOW (ref 4.22–5.81)

## 2010-04-16 LAB — PROTIME-INR
INR: 1.45 (ref 0.00–1.49)
Prothrombin Time: 17.5 seconds — ABNORMAL HIGH (ref 11.6–15.2)

## 2010-04-16 LAB — DIFFERENTIAL
Basophils Absolute: 0 10*3/uL (ref 0.0–0.1)
Basophils Relative: 0 % (ref 0–1)
Eosinophils Absolute: 0.2 10*3/uL (ref 0.0–0.7)
Eosinophils Relative: 2 % (ref 0–5)
Neutro Abs: 8.3 10*3/uL — ABNORMAL HIGH (ref 1.7–7.7)

## 2010-04-16 LAB — BASIC METABOLIC PANEL
Calcium: 9.3 mg/dL (ref 8.4–10.5)
Creatinine, Ser: 1.12 mg/dL (ref 0.4–1.5)
GFR calc non Af Amer: >60 mL/min (ref >60)
Potassium: 4.5 mEq/L (ref 3.5–5.1)
Sodium: 136 mEq/L (ref 135–145)

## 2010-04-17 LAB — CBC
HCT: 35.5 % — ABNORMAL LOW (ref 39.0–52.0)
Hemoglobin: 11.8 g/dL — ABNORMAL LOW (ref 13.0–17.0)
MCH: 27.3 pg (ref 26.0–34.0)
MCH: 27.5 pg (ref 26.0–34.0)
MCHC: 32.8 g/dL (ref 30.0–36.0)
MCHC: 32.8 g/dL (ref 30.0–36.0)
MCV: 83.3 fL (ref 78.0–100.0)
Platelets: 212 10*3/uL (ref 150–400)
RDW: 15.4 % (ref 11.5–15.5)
WBC: 12.6 10*3/uL — ABNORMAL HIGH (ref 4.0–10.5)

## 2010-04-17 LAB — DIFFERENTIAL
Basophils Absolute: 0 10*3/uL (ref 0.0–0.1)
Basophils Absolute: 0.1 10*3/uL (ref 0.0–0.1)
Basophils Relative: 1 % (ref 0–1)
Eosinophils Absolute: 0 10*3/uL (ref 0.0–0.7)
Eosinophils Absolute: 0.4 10*3/uL (ref 0.0–0.7)
Eosinophils Relative: 0 % (ref 0–5)
Monocytes Absolute: 0.8 10*3/uL (ref 0.1–1.0)
Monocytes Absolute: 1.6 10*3/uL — ABNORMAL HIGH (ref 0.1–1.0)
Monocytes Absolute: 1.9 10*3/uL — ABNORMAL HIGH (ref 0.1–1.0)
Monocytes Relative: 14 % — ABNORMAL HIGH (ref 3–12)
Monocytes Relative: 15 % — ABNORMAL HIGH (ref 3–12)
Neutro Abs: 10 10*3/uL — ABNORMAL HIGH (ref 1.7–7.7)
Neutro Abs: 5.1 10*3/uL (ref 1.7–7.7)
Neutro Abs: 8.6 10*3/uL — ABNORMAL HIGH (ref 1.7–7.7)
Neutrophils Relative %: 66 % (ref 43–77)
Neutrophils Relative %: 75 % (ref 43–77)

## 2010-04-17 LAB — CROSSMATCH: Antibody Screen: NEGATIVE

## 2010-04-17 LAB — PROTIME-INR
Prothrombin Time: 13.2 seconds (ref 11.6–15.2)
Prothrombin Time: 13.6 seconds (ref 11.6–15.2)

## 2010-04-17 LAB — BASIC METABOLIC PANEL
BUN: 8 mg/dL (ref 6–23)
CO2: 25 mEq/L (ref 19–32)
Calcium: 8.9 mg/dL (ref 8.4–10.5)
Chloride: 102 mEq/L (ref 96–112)
Chloride: 104 mEq/L (ref 96–112)
Creatinine, Ser: 1.12 mg/dL (ref 0.4–1.5)
Creatinine, Ser: 1.16 mg/dL (ref 0.4–1.5)
GFR calc Af Amer: >60 mL/min (ref >60)
GFR calc Af Amer: >60 mL/min (ref >60)
Glucose, Bld: 151 mg/dL — ABNORMAL HIGH (ref 70–99)
Potassium: 3.8 mEq/L (ref 3.5–5.1)
Sodium: 135 mEq/L (ref 135–145)
Sodium: 136 mEq/L (ref 135–145)

## 2010-04-17 LAB — GLUCOSE, CAPILLARY: Glucose-Capillary: 106 mg/dL — ABNORMAL HIGH (ref 70–99)

## 2010-04-17 LAB — SURGICAL PCR SCREEN
MRSA, PCR: NEGATIVE
Staphylococcus aureus: NEGATIVE

## 2010-04-17 LAB — APTT: aPTT: 25 seconds (ref 24–37)

## 2010-06-14 NOTE — Op Note (Signed)
NAME:  Brendan Scott, Brendan Scott NO.:  [id number]   MEDICAL RECORD NO.:  [id number]          PATIENT TYPE:  INP   LOCATION:  A336                          FACILITY:  [facility]   PHYSICIAN:  Vickki Hearing, M.D.DATE OF BIRTH:  1945-11-03   DATE OF PROCEDURE:  07/30/2007  DATE OF DISCHARGE:                               OPERATIVE REPORT   PREOPERATIVE DIAGNOSIS:  Right knee osteoarthritis.   POSTOPERATIVE DIAGNOSIS:  Right knee osteoarthritis.   PROCEDURE:  Right total knee arthroplasty.   IMPLANTS:  Smith & Nephew size 5 right tibial baseplate, 9 mm posterior  stabilized insert, size 6 posterior stabilized legion femoral component,  size 35 mm Genesis II resurfacing patellar component use DePuy SmartSet  bone cement, 40 grams x2.   OPERATIVE FINDINGS:  Severe wear medial compartment, varus alignment,  mild flexion contracture.   HISTORY OF PRESENT ILLNESS:  A 65 year old male with long history of  right knee pain status post knee arthroscopy several years ago did well  until several months ago presented with right greater than left knee  pain.  He was treated with nonoperative measures including injection,  analgesics, activity modification.  He succumbed to pain of knee  osteoarthritis.  Informed for consent was done in the office.  He  consented for total knee replacement.   DETAILS OF PROCEDURE:  This patient was identified with proper site  marking and proper identification.  He had right knee marked for  surgery.  Surgeon countersigned, and he was taken to surgery, given antibiotics,  given a gram of Ancef.  He had a spinal anesthetic.  He was  placed  supine.  Foley catheter was placed sterilely, right leg was placed in a  tourniquet and then prepped with DuraPrep, draped sterilely.  Time-out  procedure was completed. Brendan Scott was to have a right total knee  arthroplasty.  Instruments were present.  Radiographs were up.  Antibiotics were started within an  hour of skin incision.   A midline incision was made with the knee flexed.  Subcutaneous tissue  was divided.  Medial arthrotomy was performed.  Patella was everted.  Patellofemoral ligament was released.  Soft tissue release was made on  the medial side of the knee to the mid coronal plane.  The ACL, PCL were  resected.  The remnant of the medial meniscus was removed.  Lateral  meniscus was removed.   Drill was placed in the center of the femoral canal which was irrigated  and suctioned until it was clean.  Five degrees valgus 11 mm distal  femoral cut was made with an oscillating saw via distal cutting block.  Femur was sized to a size 6,  3 degrees of external rotation  corresponding to the epicondyles was set and the 2 drill holes were  made.  Size six cutting block was then placed, four cuts were made.   Tibia was then prepared with neutral cut.  We referenced 9 mm from the  lateral condyle with a 0 degree cutting block in neutral alignment AP  and lateral.   The tibia then  measured to a size 5.  We did a spacer block check.  The  gaps were balanced.  Ligaments were stable.  We then cut the box cut  with the reamer, then did a trial reduction.  Soft tissue release was  done to further balance the tight medial side.   We then cut the patella down from a 24-1/2 to a 15-1/2 for a 9-mm  patellar button.  We drilled 3 lug holes for 35 mm patella.   Trial components were placed and tibila rotation was set. The tibial  base plate was placed and the tibia was reamed and punced.   We then irrigated the knee, dried the bone, mixed the cement, cemented  the components in place, removed the excess cement, and allowed the  cement to cure.  Once the cement was cured, did another trial range of  motion.  I was happy with the extension which was at 0, the flexion  passive test was 115 degrees.   The knee was stable in flexion and in extension.   The trial component was removed.  The real  polyethylene tray was  inserted.  The knee was irrigated.  Any fragments of bone and cement  were cleared and the knee was closed.   We injected 30 mL of Marcaine with epinephrine.  We closed with #1  Bralon for the extensor mechanism and then 2-0 Monocryl for the skin and  subcu over a pain pump catheter.  We then closed with staples for the  rest of the skin.  We took a radiograph of the knee which showed the  prosthesis to be in good position.   We wrapped the leg from toes to groin and the patient was taken to  recovery room in stable condition.      Vickki Hearing, M.D.  Electronically Signed     SEH/MEDQ  D:  07/30/2007  T:  07/31/2007  Job:  [id number]

## 2010-06-14 NOTE — H&P (Signed)
NAME:  Brendan Scott, Brendan Scott                 ACCOUNT NO.:  [id number]   MEDICAL RECORD NO.:  [id number]          PATIENT TYPE:  AMB   LOCATION:  DAY                           FACILITY:  [facility]   PHYSICIAN:  Vickki Hearing, M.D.DATE OF BIRTH:  [birth year]   DATE OF ADMISSION:  DATE OF DISCHARGE:  LH                              HISTORY & PHYSICAL   CHIEF COMPLAINT:  Right knee pain.   HISTORY:  A 65 year old male with a long history of right knee pain,  status post knee arthroscopy by Dr. Hilda Lias several years ago has been  followed for several months now with right greater than left knee pain,  medial tenderness without swelling.  He complains of difficulty walking,  but climbs the steps okay.  He denies any giving-way.  He has been  treated with several nonoperative measures, including injection,  analgesics, modification of activity and has succumbed to the pain of  knee arthritis.   In the office, informed consent was given and he knows about the  complications of this procedure, including deep vein thrombosis,  pulmonary embolus, postop stiffness and pain if he does not do rehab  well.  He wishes to proceed.   PRIMARY CARE PHYSICIAN:  Dr. Regino Schultze.   MEDICATIONS:  1. Hydrocodone 7.5.  2. Levaquin now stopped.  3. Glimepiride.  4. Simvastatin.  5. Cyclobenzaprine.  6. Vospire ER.   CURRENT ALLERGIES:  NONE KNOWN.   MEDICAL PROBLEMS:  Diabetes.   SURGICAL HISTORY:  1. Ear surgery.  2. Right knee arthroscopy.   RISK FACTORS:  Social habits includes no tobacco or alcohol.   REVIEW OF SYSTEMS:  Denied all problems under all systems.   PHYSICAL EXAMINATION:  VITAL SIGNS:  Stable.  APPEARANCE:  Normal development, nutrition, grooming, hygiene, small  body frame.  CARDIOVASCULAR:  Observation and palpation were normal.  CERVICAL LYMPH:  Normal.  SKIN:  Showed no abnormalities.  NEUROLOGIC:  Shows normal coordination and reflexes, normal sensation.  PSYCHIATRIC:  Shows he is  awake, alert and oriented x3.  Mood and affect  are normal.  GAIT EXAMINATION:  Shows an antalgic gait favoring the right lower  extremity.  He has varus malalignment of both knees.  His right knee  exam reveals small flexion contracture, approximately 10 degrees with  flexion up to 110 degrees.  He has medial joint line tenderness with  negative meniscal signs and stable ligaments.  His upper extremities are  well-aligned without contracture, subluxation, atrophy or tremor.   IMPRESSION:  Osteoarthritis right knee, 715.16, worsening   PLAN:  Smith & Nephew right total knee arthroplasty.  Radiographs were  done in the office on July 22, 2007, which showed varus malalignment  with joint compromise on the medial side, severe osteoarthritis noted.  Postop follow-up 2 weeks after surgery.      Vickki Hearing, M.D.  Electronically Signed     SEH/MEDQ  D:  07/29/2007  T:  07/29/2007  Job:  [id number]

## 2010-06-14 NOTE — Discharge Summary (Signed)
Brendan Scott, Brendan Scott NO.:  [id number]   MEDICAL RECORD NO.:  [id number]          PATIENT TYPE:  INP   LOCATION:  A336                          FACILITY:  [facility]   PHYSICIAN:  Vickki Hearing, M.D.DATE OF BIRTH:  12/10/1945   DATE OF ADMISSION:  07/30/2007  DATE OF DISCHARGE:  07/03/2009LH                               DISCHARGE SUMMARY   ADMITTING DIAGNOSIS:  Osteoarthritis of the right knee.   DISCHARGE DIAGNOSIS:  Osteoarthritis of the right knee.   PROCEDURE:  Right total knee arthroplasty.   IMPLANTS USED:  Katrinka Blazing and Nephew Legion/Genesis II total knee system,  sizes; size 5 right tibial base plate 9 mm posterior stabilized insert,  size 6 posterior stabilized femur, size 35 genesis II resurfacing  patella, DePuy, Smartset bone cement.   OPERATIVE FINDINGS:  Severe wear of the medial compartment; varus  alignment mild and flexion contracture mild.   HISTORY:  A 65 year old male with a long history of right knee pain,  status post right knee arthroscopy several years ago.  Did well until  several months ago.  He had nonoperative treatment including injection  analgesics activity modification, but succumbed to pain of  osteoarthritis of the knee.  After discussing his risks, benefits, and  options he opted for total knee replacement and his radiographs and  symptoms warranted the same.   HOSPITAL COURSE:  After admission on the July 30, 2007, he had a total  knee arthroplasty of the right knee on the same day, did well.  It was  done under spinal anesthetic.  He went into our standard total knee  protocol which includes immediate CPM, Coumadin for DVT, prevention  along with foot pumps and early mobilization.  He had physical therapy  daily.  He ambulated 30 feet on the August 01, 2007, with a standby assist.  His range of motion was 7-65, active assisted.  I performed range of  motion exercises with him and was able to get full extension passively,  although it was uncomfortable for him.   He had 3 blisters on the lateral portion of the knee.  I decompressed  them and applied Xeroform and coverderm dressing.   LABORATORY STUDIES:  Creatinine 1.17, hemoglobin 9.9, and INR 1.3.   She will have home health physical therapy 3 times a week.  He will get  a PT/INR twice a week.  He will be on Coumadin for a total of 14 days.   DISCHARGE MEDICATIONS:  1. Percocet 5/325 mg q.4 p.r.n. for pain, #70.  2. Celebrex 200 mg b.i.d. as part of a pain protocol, will be on that      for 10 days.  3. Would be on Coumadin.  We will start with 3.0 mg daily for a period      of 10 more days.  4. We will continue his glimepiride 2 mg daily.  5. Fish oil 500 mg daily.  6. Albuterol inhaler and nebulizer as needed.   FOLLOWUP:  His followup is scheduled for the August 13, 2007.  His overall  condition is improved.  His condition is stable.  Disposition is to  home.      Vickki Hearing, M.D.  Electronically Signed     SEH/MEDQ  D:  08/02/2007  T:  08/02/2007  Job:  [id number]

## 2010-06-17 NOTE — Procedures (Signed)
   NAME:  Brendan Scott, Brendan Scott                           ACCOUNT NO.:  [id number]   MEDICAL RECORD NO.:  0                          PATIENT TYPE:  AMB   LOCATION:                                       FACILITY:  [facility]   PHYSICIAN:  Vida Roller, M.D.                DATE OF BIRTH:  [birth year]   DATE OF PROCEDURE:  07/08/2002  DATE OF DISCHARGE:                                EKG INTERPRETATION   TIME:  0625.   Normal sinus rhythm with a short PR interval.  There is no ventricular  preexcitation and the PR interval is 135 msec, but actually appears shorter  than that and is likely less than 120.  QRS duration and a QT interval are  normal.  The axes are all normal.  There is no ST-T wave changes concerning  for ischemia.  There is significant artifact seen which interferes with the  interpretation.                                               Vida Roller, M.D.    JH/MEDQ  D:  07/08/2002  T:  07/08/2002  Job:  [id number]

## 2010-06-17 NOTE — H&P (Signed)
NAME:  Brendan Scott, Brendan Scott                           ACCOUNT NO.:  [id number]   MEDICAL RECORD NO.:  [id number]                   PATIENT TYPE:  OUT   LOCATION:  RAD                                  FACILITY:  [facility]   PHYSICIAN:  J. Darreld Mclean, M.D.              DATE OF BIRTH:  [birth year]   DATE OF ADMISSION:  05/05/2002  DATE OF DISCHARGE:  05/05/2002                                HISTORY & PHYSICAL   CHIEF COMPLAINT:  Right knee pain.   HISTORY OF PRESENT ILLNESS:  The patient is a 65 year old male with pain and  tenderness in his right knee for some time.  I saw him in the office in  April and obtained an MRI of his knee.  The MRI was done of the right knee  and showed focal osteonecrosis versus osteochondral injury of the medial  femoral condyle with a partial ACL tear and a partial tear of the medial  collateral ligament.  He had a tear of the meniscal femoral ligament.  Note,  however, was an extensive tear of the medial meniscus in the posterior horn  with questionable tear of the popliteus tendon.  He had significant  degenerative joint disease present with focal tenderness noted of the  patella.  Surgery was recommended, however, the patient wanted to hold off  on surgery.  He was treated with Vioxx and 50 mg of Vicodin which he did  well with.  However, the knee pain has continued and he has started limping  and the knee is giving way and he wants to ahead and proceed with the  surgery.   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  The patient has had trouble with his left knee with  a meniscal injury there, but he says the right knee bothers him more than  the left.   PAST MEDICAL HISTORY:  1. Diabetes, non-insulin dependent.  2. He denies any other significant medical problems.   ALLERGIES:  He denies any allergies.   MEDICATIONS:  1. Amaryl 4 mg a day.   SOCIAL HISTORY:  He does not smoke and does not  drink.  He attended school  through the tenth grade.   Dr. Sherwood Gambler is his family doctor.   PAST SURGICAL HISTORY:  1. Ear surgery in 1990.  2. Lipoma on right hip by Dr. Malvin Johns in 2002.   FAMILY HISTORY:  His mother had Alzheimer's and his father is deceased  secondary to diabetes and other medical problems.   PHYSICAL EXAMINATION:  GENERAL:  He is alert, cooperative and oriented.  VITAL SIGNS:  The patient's vital signs are normal.  HEENT:  Negative.  The neck is supple.  LUNGS:  Clear to percussion and auscultation.  HEART:  Regular rate and rhythm without murmur heard.  ABDOMEN:  Soft and nontender without masses.  EXTREMITIES:  He has pain and tenderness in his right knee with a slight  effusion.  Neurovascular is intact.  Other extremities are within normal  limits.  CNS:  Intact.  SKIN:  Intact.   IMPRESSION:  Tear of medial meniscus of the right knee with partial medial  collateral ligament strain and degenerative joint disease.   PLAN:  Operative arthroscopy and medial meniscectomy, right knee.  The  patient understands the procedure, risks and possible complications.  Laboratories are pending.                                               Teola Bradley, M.D.    JWK/MEDQ  D:  07/02/2002  T:  07/02/2002  Job:  [id number]

## 2010-06-17 NOTE — Op Note (Signed)
NAME:  Brendan Scott, Brendan Scott                           ACCOUNT NO.:  [id number]   MEDICAL RECORD NO.:  [id number]                   PATIENT TYPE:  AMB   LOCATION:  DAY                                  FACILITY:  [facility]   PHYSICIAN:  J. Darreld Mclean, M.D.              DATE OF BIRTH:  1945-03-06   DATE OF PROCEDURE:  DATE OF DISCHARGE:                                 OPERATIVE REPORT   PREOPERATIVE DIAGNOSIS:  Tear of medial meniscus, right knee.   POSTOPERATIVE DIAGNOSIS:  Tear of medial meniscus, right knee, with anterior  cruciate ligament partial tear.   PROCEDURE:  Right knee arthroscopy, partial right medial meniscectomy.   ANESTHESIA:  General.   TOURNIQUET TIME:  15 minutes.   DRAINS:  None.   SURGEON:  J. Darreld Mclean, M.D.   ASSISTANT:  Candace Cruise, P.A.   INDICATIONS FOR PROCEDURE:  The patient is a 65 year old male with pain and  tenderness in his right knee with some giving way.  MRI shows tear of the  medial meniscus of the knee.  It has not improved with conservative  treatment.  Surgery is recommended.   OPERATIVE FINDINGS:  The suprapatellar pouch had mild synovitis and mild  DJD.  The patellofemoral joint with grade 2.  Medially, there was a tear in  the posterior horn, a large incomplete bucket handle type tear.  There was  DJD of the knee and grade 2 to 3 changes.  The ACL had a partial tear.  Laterally, there was grade 2 to 3 changes but no tear of the meniscus.   No loose bodies were present.  Pertinent pictures were taken throughout the  case.   DESCRIPTION OF PROCEDURE:  The patient was given general anesthesia while  supine on the operating room table.  A tourniquet was placed deflated on the  right upper thigh with the leg holder attached.  The patient was prepped and  draped in the usual manner.  We ascertained we were doing Mr. Coolman, doing  his right knee.  Esmarch bandage was used and wrapped circumferentially.  The tourniquet was inflated to 300  mmHg.  The Esmarch bandage was removed.  The inflow cannula was inserted medially.  Lactated Ringer's was instilled  into the knee via an infusion pump.   The arthroscope was inserted laterally and the knee systematically examined.  Please see findings above.  Pertinent pictures were taken.  Attention was  directed to the medial side.  Using the meniscal knife, a large section of  the incomplete bucket handle tear was removed with the grasping forceps.  The remaining portion was then smoothed using a meniscal shaver and a  meniscal punch.  The knee was reexamined, and no new pathology was found.  Lactated Ringer's was instilled into the knee via an infusion pump.  The  wound was reapproximated with a 3-0 nylon in an interrupted vertical  mattress manner.  The patient tolerated the procedure well.  The tourniquet  was deflated after 15 minutes.  No drains were used.  He went to recovery in  good condition after application of a sterile dressing, bulky dressing, knee  immobilizer.  A prescription for Vicodin ES was given for pain.  He will be  seen in the office in approximately 10 days to 2 weeks.  If any difficulty,  he is to contact me through the office or hospital beeper system.  Numbers  have been provided to call with any problem.                                               Teola Bradley, M.D.    JWK/MEDQ  D:  07/10/2002  T:  07/10/2002  Job:  [id number]

## 2010-10-18 ENCOUNTER — Encounter: Payer: Self-pay | Admitting: Orthopedic Surgery

## 2010-10-18 ENCOUNTER — Ambulatory Visit (INDEPENDENT_AMBULATORY_CARE_PROVIDER_SITE_OTHER): Payer: Medicare Other | Admitting: Orthopedic Surgery

## 2010-10-18 VITALS — Ht 67.0 in | Wt 213.0 lb

## 2010-10-18 DIAGNOSIS — M25569 Pain in unspecified knee: Secondary | ICD-10-CM | POA: Insufficient documentation

## 2010-10-18 DIAGNOSIS — E119 Type 2 diabetes mellitus without complications: Secondary | ICD-10-CM | POA: Insufficient documentation

## 2010-10-18 DIAGNOSIS — Z96659 Presence of unspecified artificial knee joint: Secondary | ICD-10-CM

## 2010-10-18 MED ORDER — HYDROCODONE-ACETAMINOPHEN 5-325 MG PO TABS: 1.0000 | ORAL_TABLET | Freq: Four times a day (QID) | ORAL | Status: DC | PRN

## 2010-10-18 NOTE — Progress Notes (Signed)
Brendan Scott had bilateral knee replacements. LEFT knee was most recent. About 3 weeks ago he fell onto his LEFT knee complains of some pain bilaterally, actually. He is on Voltaren cream.  Here today for reevaluation annual LEFT knee film.  Knee surgery was on August 10, 2009  Review of systems no catching, locking, or giving way. Pain is bilateral. LEFT knee pain is described as dull, 4/10, associated with some swelling. Past Medical History  Diagnosis Date  . Arthritis   . Diabetes mellitus     Physical Exam(6) GENERAL: normal development   CDV: pulses are normal   Skin: normal  Psychiatric: awake, alert and oriented  Neuro: normal sensation  MSK LEFT knee.  RIGHT knee. 1 RIGHT and LEFT knee anterior knee swelling. 2 RIGHT knee. Range of motion 0-115, LEFT knee 0-95 3 Both knees are stable, strength is normal. 4 No tenderness.  Imaging: LEFT knee x-ray stable postop prosthetic knee   Assessment: Anterior knee pain bilaterally    Plan: Continue Voltaren gel.norco x 2 weeks   Separate x-ray report AP, lateral, and patellofemoral view, LEFT knee.  There is tilt to the tibial prosthesis with posterior extrusion of cement, which is stable compared to previous films. Overall alignment in the frontal and coronal planes, otherwise, normal.  Impression stable knee prosthesis with anterior tibial tilt

## 2010-10-27 LAB — CBC
HCT: 38.7 — ABNORMAL LOW
HCT: 29.5 — ABNORMAL LOW
HCT: 33.3 — ABNORMAL LOW
HCT: 36.4 — ABNORMAL LOW
Hemoglobin: 10.9 — ABNORMAL LOW
Hemoglobin: 11.9 — ABNORMAL LOW
Hemoglobin: 9.9 — ABNORMAL LOW
MCHC: 33.6
MCHC: 33.4
MCV: 82.1
MCV: 83
Platelets: 178
RDW: 14.8
RDW: 14.8
WBC: 6.6
WBC: 10.1

## 2010-10-27 LAB — CROSSMATCH
ABO/RH(D): O POS
Antibody Screen: NEGATIVE

## 2010-10-27 LAB — BASIC METABOLIC PANEL
BUN: 9
CO2: 27
CO2: 29
CO2: 33 — ABNORMAL HIGH
Calcium: 9.4
Chloride: 108
Chloride: 101
Chloride: 102
Creatinine, Ser: 1.09
GFR calc non Af Amer: >60
GFR calc non Af Amer: >60
Glucose, Bld: 138 — ABNORMAL HIGH
Glucose, Bld: 156 — ABNORMAL HIGH
Potassium: 4
Potassium: 4
Potassium: 4.1
Potassium: 4.2
Sodium: 136
Sodium: 137
Sodium: 138

## 2010-10-27 LAB — ABO/RH: ABO/RH(D): O POS

## 2010-10-27 LAB — DIFFERENTIAL
Basophils Absolute: 0
Basophils Absolute: 0
Basophils Relative: 0
Eosinophils Absolute: 0
Eosinophils Relative: 0
Eosinophils Relative: 0
Eosinophils Relative: 0
Lymphocytes Relative: 5 — ABNORMAL LOW
Lymphocytes Relative: 8 — ABNORMAL LOW
Lymphs Abs: 0.8
Monocytes Absolute: 1.4 — ABNORMAL HIGH
Monocytes Absolute: 2 — ABNORMAL HIGH
Monocytes Absolute: 2 — ABNORMAL HIGH
Monocytes Relative: 14 — ABNORMAL HIGH

## 2010-10-27 LAB — PROTIME-INR: Prothrombin Time: 14.7

## 2011-05-10 DIAGNOSIS — E1149 Type 2 diabetes mellitus with other diabetic neurological complication: Secondary | ICD-10-CM | POA: Diagnosis not present

## 2011-05-10 DIAGNOSIS — E119 Type 2 diabetes mellitus without complications: Secondary | ICD-10-CM | POA: Diagnosis not present

## 2011-08-29 DIAGNOSIS — E1149 Type 2 diabetes mellitus with other diabetic neurological complication: Secondary | ICD-10-CM | POA: Diagnosis not present

## 2011-08-29 DIAGNOSIS — E119 Type 2 diabetes mellitus without complications: Secondary | ICD-10-CM | POA: Diagnosis not present

## 2011-10-19 ENCOUNTER — Ambulatory Visit: Payer: Medicare Other | Admitting: Orthopedic Surgery

## 2011-10-19 ENCOUNTER — Encounter: Payer: Self-pay | Admitting: Orthopedic Surgery

## 2011-10-31 DIAGNOSIS — Z79899 Other long term (current) drug therapy: Secondary | ICD-10-CM | POA: Diagnosis not present

## 2011-10-31 DIAGNOSIS — E119 Type 2 diabetes mellitus without complications: Secondary | ICD-10-CM | POA: Diagnosis not present

## 2011-10-31 DIAGNOSIS — K089 Disorder of teeth and supporting structures, unspecified: Secondary | ICD-10-CM | POA: Diagnosis not present

## 2011-11-04 DIAGNOSIS — K122 Cellulitis and abscess of mouth: Secondary | ICD-10-CM | POA: Diagnosis not present

## 2011-11-04 DIAGNOSIS — K047 Periapical abscess without sinus: Secondary | ICD-10-CM | POA: Diagnosis not present

## 2011-11-04 DIAGNOSIS — Z79899 Other long term (current) drug therapy: Secondary | ICD-10-CM | POA: Diagnosis not present

## 2011-11-04 DIAGNOSIS — E119 Type 2 diabetes mellitus without complications: Secondary | ICD-10-CM | POA: Diagnosis not present

## 2011-11-05 DIAGNOSIS — K122 Cellulitis and abscess of mouth: Secondary | ICD-10-CM | POA: Diagnosis not present

## 2011-12-13 DIAGNOSIS — E119 Type 2 diabetes mellitus without complications: Secondary | ICD-10-CM | POA: Diagnosis not present

## 2011-12-13 DIAGNOSIS — E1149 Type 2 diabetes mellitus with other diabetic neurological complication: Secondary | ICD-10-CM | POA: Diagnosis not present

## 2011-12-15 ENCOUNTER — Inpatient Hospital Stay (HOSPITAL_COMMUNITY): Payer: BC Managed Care – PPO

## 2011-12-15 ENCOUNTER — Encounter (HOSPITAL_COMMUNITY): Payer: Self-pay | Admitting: Nurse Practitioner

## 2011-12-15 ENCOUNTER — Inpatient Hospital Stay (HOSPITAL_COMMUNITY)
Admission: EM | Admit: 2011-12-15 | Discharge: 2011-12-27 | DRG: 014 | Disposition: A | Discharge status: Rehabilitation Facility | Payer: BC Managed Care – PPO | Attending: Neurology | Admitting: Neurology

## 2011-12-15 ENCOUNTER — Emergency Department (HOSPITAL_COMMUNITY): Payer: BC Managed Care – PPO

## 2011-12-15 DIAGNOSIS — R0603 Acute respiratory distress: Secondary | ICD-10-CM

## 2011-12-15 DIAGNOSIS — M129 Arthropathy, unspecified: Secondary | ICD-10-CM | POA: Diagnosis present

## 2011-12-15 DIAGNOSIS — J989 Respiratory disorder, unspecified: Secondary | ICD-10-CM

## 2011-12-15 DIAGNOSIS — E119 Type 2 diabetes mellitus without complications: Secondary | ICD-10-CM

## 2011-12-15 DIAGNOSIS — G8190 Hemiplegia, unspecified affecting unspecified side: Secondary | ICD-10-CM | POA: Diagnosis present

## 2011-12-15 DIAGNOSIS — I4891 Unspecified atrial fibrillation: Secondary | ICD-10-CM | POA: Diagnosis present

## 2011-12-15 DIAGNOSIS — E669 Obesity, unspecified: Secondary | ICD-10-CM | POA: Diagnosis present

## 2011-12-15 DIAGNOSIS — H53469 Homonymous bilateral field defects, unspecified side: Secondary | ICD-10-CM | POA: Diagnosis present

## 2011-12-15 DIAGNOSIS — R0609 Other forms of dyspnea: Secondary | ICD-10-CM | POA: Diagnosis not present

## 2011-12-15 DIAGNOSIS — I672 Cerebral atherosclerosis: Secondary | ICD-10-CM | POA: Diagnosis not present

## 2011-12-15 DIAGNOSIS — I6340 Cerebral infarction due to embolism of unspecified cerebral artery: Principal | ICD-10-CM | POA: Diagnosis present

## 2011-12-15 DIAGNOSIS — R1310 Dysphagia, unspecified: Secondary | ICD-10-CM | POA: Diagnosis present

## 2011-12-15 DIAGNOSIS — G40109 Localization-related (focal) (partial) symptomatic epilepsy and epileptic syndromes with simple partial seizures, not intractable, without status epilepticus: Secondary | ICD-10-CM | POA: Diagnosis not present

## 2011-12-15 DIAGNOSIS — D72829 Elevated white blood cell count, unspecified: Secondary | ICD-10-CM | POA: Diagnosis present

## 2011-12-15 DIAGNOSIS — B964 Proteus (mirabilis) (morganii) as the cause of diseases classified elsewhere: Secondary | ICD-10-CM | POA: Diagnosis present

## 2011-12-15 DIAGNOSIS — E785 Hyperlipidemia, unspecified: Secondary | ICD-10-CM | POA: Diagnosis present

## 2011-12-15 DIAGNOSIS — R29810 Facial weakness: Secondary | ICD-10-CM | POA: Diagnosis present

## 2011-12-15 DIAGNOSIS — N390 Urinary tract infection, site not specified: Secondary | ICD-10-CM | POA: Diagnosis not present

## 2011-12-15 DIAGNOSIS — I6330 Cerebral infarction due to thrombosis of unspecified cerebral artery: Secondary | ICD-10-CM | POA: Diagnosis not present

## 2011-12-15 DIAGNOSIS — G936 Cerebral edema: Secondary | ICD-10-CM | POA: Diagnosis present

## 2011-12-15 DIAGNOSIS — I10 Essential (primary) hypertension: Secondary | ICD-10-CM | POA: Diagnosis present

## 2011-12-15 DIAGNOSIS — R471 Dysarthria and anarthria: Secondary | ICD-10-CM | POA: Diagnosis present

## 2011-12-15 DIAGNOSIS — I6350 Cerebral infarction due to unspecified occlusion or stenosis of unspecified cerebral artery: Secondary | ICD-10-CM | POA: Diagnosis not present

## 2011-12-15 DIAGNOSIS — T17908A Unspecified foreign body in respiratory tract, part unspecified causing other injury, initial encounter: Secondary | ICD-10-CM

## 2011-12-15 DIAGNOSIS — I63419 Cerebral infarction due to embolism of unspecified middle cerebral artery: Secondary | ICD-10-CM | POA: Diagnosis present

## 2011-12-15 DIAGNOSIS — Z4682 Encounter for fitting and adjustment of non-vascular catheter: Secondary | ICD-10-CM | POA: Diagnosis not present

## 2011-12-15 DIAGNOSIS — Z87891 Personal history of nicotine dependence: Secondary | ICD-10-CM

## 2011-12-15 DIAGNOSIS — Z96659 Presence of unspecified artificial knee joint: Secondary | ICD-10-CM | POA: Diagnosis not present

## 2011-12-15 DIAGNOSIS — D329 Benign neoplasm of meninges, unspecified: Secondary | ICD-10-CM

## 2011-12-15 DIAGNOSIS — IMO0001 Reserved for inherently not codable concepts without codable children: Secondary | ICD-10-CM | POA: Diagnosis not present

## 2011-12-15 DIAGNOSIS — I639 Cerebral infarction, unspecified: Secondary | ICD-10-CM

## 2011-12-15 DIAGNOSIS — D320 Benign neoplasm of cerebral meninges: Secondary | ICD-10-CM

## 2011-12-15 LAB — CBC
HCT: 44.6 % (ref 39.0–52.0)
MCH: 26.4 pg (ref 26.0–34.0)
MCV: 81.1 fL (ref 78.0–100.0)
Platelets: 209 10*3/uL (ref 150–400)
RBC: 5.5 MIL/uL (ref 4.22–5.81)
RDW: 15.8 % — ABNORMAL HIGH (ref 11.5–15.5)

## 2011-12-15 LAB — POCT I-STAT, CHEM 8
Chloride: 104 mEq/L (ref 96–112)
Glucose, Bld: 229 mg/dL — ABNORMAL HIGH (ref 70–99)
HCT: 48 % (ref 39.0–52.0)
Hemoglobin: 16.3 g/dL (ref 13.0–17.0)
Potassium: 4 mEq/L (ref 3.5–5.1)
Sodium: 139 mEq/L (ref 135–145)

## 2011-12-15 LAB — COMPREHENSIVE METABOLIC PANEL
ALT: 36 U/L (ref 0–53)
Calcium: 9.6 mg/dL (ref 8.4–10.5)
Creatinine, Ser: 1.09 mg/dL (ref 0.50–1.35)
GFR calc Af Amer: 80 mL/min — ABNORMAL LOW (ref >90)
GFR calc non Af Amer: 69 mL/min — ABNORMAL LOW (ref >90)
Glucose, Bld: 237 mg/dL — ABNORMAL HIGH (ref 70–99)
Sodium: 138 mEq/L (ref 135–145)
Total Protein: 7.5 g/dL (ref 6.0–8.3)

## 2011-12-15 LAB — DIFFERENTIAL
Eosinophils Absolute: 0.1 10*3/uL (ref 0.0–0.7)
Eosinophils Relative: 1 % (ref 0–5)
Lymphs Abs: 1.5 10*3/uL (ref 0.7–4.0)
Monocytes Absolute: 1 10*3/uL (ref 0.1–1.0)

## 2011-12-15 LAB — PROTIME-INR: Prothrombin Time: 12.9 seconds (ref 11.6–15.2)

## 2011-12-15 LAB — TROPONIN I: Troponin I: <0.3 ng/mL (ref <0.30)

## 2011-12-15 MED ORDER — SODIUM CHLORIDE 0.9 % IV SOLN
1000.0000 mg | Freq: Two times a day (BID) | INTRAVENOUS | Status: DC
  Filled 2011-12-15 (×2): qty 10

## 2011-12-15 MED ORDER — FENTANYL CITRATE 0.05 MG/ML IJ SOLN
INTRAMUSCULAR | Status: DC | PRN
  Administered 2011-12-15: 25 ug via INTRAVENOUS

## 2011-12-15 MED ORDER — MIDAZOLAM HCL 2 MG/2ML IJ SOLN
INTRAMUSCULAR | Status: DC | PRN
  Administered 2011-12-15: 1 mg via INTRAVENOUS

## 2011-12-15 MED ORDER — LORAZEPAM 2 MG/ML IJ SOLN
INTRAMUSCULAR | Status: AC
  Filled 2011-12-15: qty 1

## 2011-12-15 MED ORDER — SODIUM CHLORIDE 0.9 % IV SOLN
1500.0000 mg | INTRAVENOUS | Status: AC
  Administered 2011-12-15: 1500 mg via INTRAVENOUS
  Filled 2011-12-15: qty 15

## 2011-12-15 MED ORDER — SODIUM CHLORIDE 0.9 % IV SOLN
INTRAVENOUS | Status: AC
  Administered 2011-12-16: 1000 mL via INTRAVENOUS

## 2011-12-15 MED ORDER — SENNOSIDES-DOCUSATE SODIUM 8.6-50 MG PO TABS
1.0000 | ORAL_TABLET | Freq: Every day | ORAL | Status: DC
  Administered 2011-12-18 – 2011-12-26 (×8): 1 via ORAL
  Filled 2011-12-15 (×12): qty 1

## 2011-12-15 MED ORDER — LORAZEPAM 2 MG/ML IJ SOLN
2.0000 mg | Freq: Once | INTRAMUSCULAR | Status: AC
  Administered 2011-12-15: 2 mg via INTRAVENOUS

## 2011-12-15 MED ORDER — IOHEXOL 300 MG/ML  SOLN
300.0000 mL | Freq: Once | INTRAMUSCULAR | Status: AC | PRN
  Administered 2011-12-15: 80 mL via INTRA_ARTERIAL

## 2011-12-15 NOTE — H&P (Signed)
Admission H&P    Chief Complaint: stroke HPI: Brendan Scott is an 66 y.o. male who was last seen normal at 14:30 go to work. Patient never showed up to work. Family then found him in driveway unresponsive and called EMS.  EMS arrived to find patient standing in driveway slightly confused, left facial droop and dysarthria. Code stroke was called and patient was brought to [facility] hospital.  Patient remained with left facial droop, right gaze preference, unable to cross midline, dysarthria and left hemianopsia. CT scan of the head without contrast showed evidence of a fairly large mass lesion involving the left frontotemporal region as well as possible increased density of the right middle cerebral artery proximally, and possibly early MCA territory infarction. Mass lesion on the left was thought to be incidental and likely a meningioma. As such, he was not considered a candidate for intravenous thrombolytic therapy with TPA. Patient was subsequently taken to Interventional Radiology for cerebral arteriogram was performed which showed no signs of occlusion of the right middle cerebral artery nor right internal carotid artery. There was mass effect involving the left middle cerebral artery with reduced diameter and straightening. Chronic permanent tumor blush was also seen. NIH stroke score was 8, including visual field defect and neglect of his left side as well as facial weakness and extremity weakness, and slurred speech. Modified Rankin score was 3. Patient was taking aspirin 81 mg per day prior to admission.  Past Medical History  Diagnosis Date  . Arthritis   . Diabetes mellitus     History reviewed. No pertinent past surgical history.  Family History  Problem Relation Age of Onset  . Arthritis    . Diabetes     Social History:  reports that he has quit smoking. He does not have any smokeless tobacco history on file. He reports that he does not drink alcohol or use illicit drugs.  Allergies: No  Known Allergies  Home Meds: No current facility-administered medications for this encounter.   Current Outpatient Prescriptions  Medication Sig Dispense Refill  . ACCU-CHEK AVIVA PLUS test strip       . ACCU-CHEK FASTCLIX LANCETS MISC       . aspirin 81 MG tablet Take 81 mg by mouth daily.        . fish oil-omega-3 fatty acids 1000 MG capsule Take 2 g by mouth daily.        . metFORMIN (GLUCOPHAGE) 500 MG tablet         ROS: History obtained from the patient  General ROS: negative for - chills, fatigue, fever, night sweats, weight gain or weight loss Psychological ROS: negative for - behavioral disorder, hallucinations, memory difficulties, mood swings or suicidal ideation Ophthalmic ROS: negative for - blurry vision, double vision, eye pain or loss of vision ENT ROS: negative for - epistaxis, nasal discharge, oral lesions, sore throat, tinnitus or vertigo Allergy and Immunology ROS: negative for - hives or itchy/watery eyes Hematological and Lymphatic ROS: negative for - bleeding problems, bruising or swollen lymph nodes Endocrine ROS: negative for - galactorrhea, hair pattern changes, polydipsia/polyuria or temperature intolerance Respiratory ROS: negative for - cough, hemoptysis, shortness of breath or wheezing Cardiovascular ROS: negative for - chest pain, dyspnea on exertion, edema or irregular heartbeat Gastrointestinal ROS: negative for - abdominal pain, diarrhea, hematemesis, nausea/vomiting or stool incontinence Genito-Urinary ROS: negative for - dysuria, hematuria, incontinence or urinary frequency/urgency Musculoskeletal ROS: negative for - joint swelling or muscular weakness Neurological ROS: as noted in HPI  Dermatological ROS: negative for rash and skin lesion changes   Physical Examination: Mental Status: Alert, oriented, thought content appropriate.  Speech dysarthric.  Able to follow simple step commands without difficulty. Cranial Nerves: II: Discs flat  bilaterally; Visual fields shows a left hemianopsia, gaze deviated to the right and cannot cross midline, pupils equal, round, reactive to light and accommodation III,IV, VI: ptosis present on the left,  V,VII: smile asymmetric with left facial droop, facial light touch sensation decreased on left V2-V3 VIII: hearing normal bilaterally IX,X: gag reflex present XI: bilateral shoulder shrug BJY:NWGNFA extension deviates to the left but he can overcome and move left and right.  Motor: Right : Upper extremity   5/5    Left:     Upper extremity   3/5 with drift and weak grip  Lower extremity   5/5     Lower extremity   4/5 Tone and bulk:normal tone throughout; no atrophy noted Sensory: Pinprick and light touch intact throughout, bilaterally Deep Tendon Reflexes: 2+ and symmetric throughout Plantars: Right: downgoing   Left: downgoing Cerebellar: normal finger-to-nose intact on the right -unable to participate on left,  normal heel-to-shin test CV: pulses palpable throughout     HEENT-  Normocephalic, no lesions, without obvious abnormality.  Normal external eye and conjunctiva.  Normal TM's bilaterally.  Normal auditory canals and external ears. Normal external nose, mucus membranes and septum.  Normal pharynx. Neck supple with no masses, nodes, nodules or enlargement. Cardiovascular - S1, S2 normal Lungs - chest clear, no wheezing, rales, normal symmetric air entry, Abdomen - soft, non-tender; bowel sounds normal; no masses,  no organomegaly and protrubrant Extremities - no edema, no clubbing and no cyanosis    Results for orders placed during the hospital encounter of 12/15/11 (from the past 48 hour(s))  CBC     Status: Abnormal   Collection Time   12/15/11  3:50 PM      Component Value Range Comment   WBC 11.8 (*) 4.0 - 10.5 K/uL    RBC 5.50  4.22 - 5.81 MIL/uL    Hemoglobin 14.5  13.0 - 17.0 g/dL    HCT 21.3  08.6 - 57.8 %    MCV 81.1  78.0 - 100.0 fL    MCH 26.4  26.0 - 34.0 pg     MCHC 32.5  30.0 - 36.0 g/dL    RDW 46.9 (*) 62.9 - 15.5 %    Platelets 209  150 - 400 K/uL   DIFFERENTIAL     Status: Abnormal   Collection Time   12/15/11  3:50 PM      Component Value Range Comment   Neutrophils Relative 79 (*) 43 - 77 %    Neutro Abs 9.2 (*) 1.7 - 7.7 K/uL    Lymphocytes Relative 12  12 - 46 %    Lymphs Abs 1.5  0.7 - 4.0 K/uL    Monocytes Relative 8  3 - 12 %    Monocytes Absolute 1.0  0.1 - 1.0 K/uL    Eosinophils Relative 1  0 - 5 %    Eosinophils Absolute 0.1  0.0 - 0.7 K/uL    Basophils Relative 0  0 - 1 %    Basophils Absolute 0.0  0.0 - 0.1 K/uL   POCT I-STAT, CHEM 8     Status: Abnormal   Collection Time   12/15/11  4:04 PM      Component Value Range Comment   Sodium 139  135 -  145 mEq/L    Potassium 4.0  3.5 - 5.1 mEq/L    Chloride 104  96 - 112 mEq/L    BUN 15  6 - 23 mg/dL    Creatinine, Ser 4.54  0.50 - 1.35 mg/dL    Glucose, Bld 098 (*) 70 - 99 mg/dL    Calcium, Ion 1.19  1.47 - 1.30 mmol/L    TCO2 24  0 - 100 mmol/L    Hemoglobin 16.3  13.0 - 17.0 g/dL    HCT 82.9  56.2 - 13.0 %    Ct Head (brain) Wo Contrast  12/15/2011  *RADIOLOGY REPORT*  Clinical Data: Left-sided facial droop.  Diabetic.  CT HEAD WITHOUT CONTRAST  Technique:  Contiguous axial images were obtained from the base of the skull through the vertex without contrast.  Comparison: None.  Findings: Right middle cerebral artery slightly dense.  There is minimal hypodensity right opercular region. This may reflect changes of early acute infarct.  Large hyperdense mass centered at the left clinoid with extension into the left middle cranial fossa, left cavernous sinus, left subfrontal region and orbital apex region having appearance suggestive of a meningioma with bony remodeling.  This can be assessed with contrast enhanced MR.   Hypodensity adjacent left frontal lobe may reflect vasogenic edema.  No intracranial hemorrhage.  No hydrocephalus.  Exophthalmos.  Prior right mastoidectomy.   Mild thickening along the peripheral aspect of the mastoidectomy site.  Partial opacification of residual right mastoid air cells.  IMPRESSION: Right middle cerebral artery slightly dense.  There is minimal hypodensity right opercular region. This may reflect changes of early acute infarct.  Large hyperdense mass centered at the left clinoid with extension into the left middle cranial fossa, left cavernous sinus, left subfrontal region and orbital apex region having appearance suggestive of a meningioma with bony remodeling.  This can be assessed with contrast enhanced MR.   Hypodensity adjacent left frontal lobe may reflect vasogenic edema.  No intracranial hemorrhage.  Prior right mastoidectomy.  Mild thickening along the peripheral aspect of the mastoidectomy site.  Partial opacification of residual right mastoid air cells.  This is a code stroke with results called to Dr. Roseanne Reno 12/15/2011 4:00 p.m.   Original Report Authenticated By: Lacy Duverney, M.D.     Assessment/Plan 1. Right MCA territory acute cerebral infarction with gaze preference to the right, left visual field defect and left hemiparesis. No evidence of right ICA or MCA thrombosis was seen on cerebral angiogram. 2. Left frontotemporal mass lesion, likely a meningioma, with mass effect demonstrated on the left middle cerebral artery in addition to frontal and temporal lobes.  Stroke risk factors: Diabetes mellitus, hyperlipidemia.  Plan: 1. Hemoglobin A1c and fasting lipid panel 2. 2-D echocardiogram 3. MRI of the brain without and with contrast 4. Physical therapy, occupational therapy and speech therapy consults 5. Antiplatelet therapy with Plavix 75 mg per day 6. Neurosurgery consultation for evaluation and recommendations regarding patient's intracranial mass lesion, when the patient is clinically stable.  Patient's evaluation and management required complex decision-making as well as time spent with family counseling. Total  critical care time was 90 minutes.  Venetia Maxon M.D. [facility] [phone]

## 2011-12-15 NOTE — ED Notes (Addendum)
EDP and Neurology at bedside.

## 2011-12-15 NOTE — ED Notes (Signed)
Pt awake and talking-  Knows his name, and  answering questions

## 2011-12-15 NOTE — ED Notes (Signed)
O2 d/c'd 

## 2011-12-15 NOTE — ED Provider Notes (Signed)
History  This chart was scribed for Glynn Octave, MD by Bennett Scrape, ED Scribe. This patient was seen in room A09C/A09C and the patient's care was started at 3:57 PM.    CSN: [id number]  Arrival date & time 12/15/11  1543   First MD Initiated Contact with Patient 12/15/11 1557      Chief Complaint  Patient presents with  . Code Stroke    Level 5 Caveat- AMS-Pt is unable to communicate  The history is provided by the patient. The history is limited by the condition of the patient. No language interpreter was used.    Brendan Scott is a 66 y.o. male brought in by ambulance, who presents to the Emergency Department for a possible CVA. Per EMS, mother-in- law found the pt outside his home around 3:44 PM (approximately 30 minutes ago). The last time he was seen normal was 2:30 PM when he left to go to work. He currently has garbled speech and is able to answer yes/no questions. He reports a HA but denies having CP and abdominal pain currently.  Family and EMS deny seizure-like activity and the family denies that the pt has a h/o seizures. He has a h/o DM and arthritis. He is a former smoker but denies alcohol use.  Past Medical History  Diagnosis Date  . Arthritis   . Diabetes mellitus     History reviewed. No pertinent past surgical history.  Family History  Problem Relation Age of Onset  . Arthritis    . Diabetes      History  Substance Use Topics  . Smoking status: Former Games developer  . Smokeless tobacco: Not on file     Comment: quit 30 years ago  . Alcohol Use: No      Review of Systems  Unable to perform ROS: Mental status change  Unable to communicate past yes/no questions  Allergies  Review of patient's allergies indicates no known allergies.  Home Medications   Current Outpatient Rx  Name  Route  Sig  Dispense  Refill  . ACCU-CHEK AVIVA PLUS VI STRP               . ACCU-CHEK FASTCLIX LANCETS MISC               . ASPIRIN 81 MG PO TABS  Oral   Take 81 mg by mouth daily.           . OMEGA-3 FATTY ACIDS 1000 MG PO CAPS   Oral   Take 2 g by mouth daily.           Marland Kitchen METFORMIN HCL 500 MG PO TABS                 Triage Vitals: BP 177/71  Pulse 83  Resp 24  SpO2 99%  Physical Exam  Nursing note and vitals reviewed. Constitutional: He is oriented to person, place, and time. He appears well-developed and well-nourished.  HENT:  Head: Normocephalic and atraumatic.  Eyes: Conjunctivae normal are normal. Pupils are equal, round, and reactive to light.       Right sided gaze preference, visual fields appear intact, gaze unable to cross the midline  Neck: Neck supple. No tracheal deviation present.  Cardiovascular: Normal rate, regular rhythm and normal heart sounds.   Pulmonary/Chest: Effort normal and breath sounds normal. No respiratory distress.  Musculoskeletal: Normal range of motion.  Neurological: He is alert and oriented to person, place, and time.  Left sided facial droop, left sided tongue deviation, left sided neglect with LUE weakness, no focal or motor deficits in the lower extremities   Skin: Skin is warm and dry.    ED Course  Procedures (including critical care time)  DIAGNOSTIC STUDIES: Oxygen Saturation is 100% on O2, normal by my interpretation.    COORDINATION OF CARE: 4:07 PM-Consult with neurology onsite in pt's room and they recommend a MRI. Will order MRI.   Labs Reviewed  CBC - Abnormal; Notable for the following:    WBC 11.8 (*)     RDW 15.8 (*)     All other components within normal limits  DIFFERENTIAL - Abnormal; Notable for the following:    Neutrophils Relative 79 (*)     Neutro Abs 9.2 (*)     All other components within normal limits  COMPREHENSIVE METABOLIC PANEL - Abnormal; Notable for the following:    Glucose, Bld 237 (*)     GFR calc non Af Amer 69 (*)     GFR calc Af Amer 80 (*)     All other components within normal limits  POCT I-STAT, CHEM 8 -  Abnormal; Notable for the following:    Glucose, Bld 229 (*)     All other components within normal limits  PROTIME-INR  APTT  TROPONIN I   Ct Head (brain) Wo Contrast  12/15/2011  *RADIOLOGY REPORT*  Clinical Data: Left-sided facial droop.  Diabetic.  CT HEAD WITHOUT CONTRAST  Technique:  Contiguous axial images were obtained from the base of the skull through the vertex without contrast.  Comparison: None.  Findings: Right middle cerebral artery slightly dense.  There is minimal hypodensity right opercular region. This may reflect changes of early acute infarct.  Large hyperdense mass centered at the left clinoid with extension into the left middle cranial fossa, left cavernous sinus, left subfrontal region and orbital apex region having appearance suggestive of a meningioma with bony remodeling.  This can be assessed with contrast enhanced MR.   Hypodensity adjacent left frontal lobe may reflect vasogenic edema.  No intracranial hemorrhage.  No hydrocephalus.  Exophthalmos.  Prior right mastoidectomy.  Mild thickening along the peripheral aspect of the mastoidectomy site.  Partial opacification of residual right mastoid air cells.  IMPRESSION: Right middle cerebral artery slightly dense.  There is minimal hypodensity right opercular region. This may reflect changes of early acute infarct.  Large hyperdense mass centered at the left clinoid with extension into the left middle cranial fossa, left cavernous sinus, left subfrontal region and orbital apex region having appearance suggestive of a meningioma with bony remodeling.  This can be assessed with contrast enhanced MR.   Hypodensity adjacent left frontal lobe may reflect vasogenic edema.  No intracranial hemorrhage.  Prior right mastoidectomy.  Mild thickening along the peripheral aspect of the mastoidectomy site.  Partial opacification of residual right mastoid air cells.  This is a code stroke with results called to Dr. Roseanne Reno 12/15/2011 4:00 p.m.    Original Report Authenticated By: Lacy Duverney, M.D.      1. CVA (cerebral infarction)   2. Meningioma       MDM  Code stroke from home last seen normal at 2:30 PM. Found to have left-sided facial droop, right gaze preference to person and weakness. Awake and alert protecting airway. Neurology team at bedside on arrival.  Called by radiology regarding CT findings.  Neurology planning to take patient to IR for angiogram and possible intervention.  Not tPA  candidate 2/2 intracranial mass.   Date: 12/15/2011  Rate: 77  Rhythm: atrial fibrillation  QRS Axis: normal  Intervals: normal  ST/T Wave abnormalities: normal  Conduction Disutrbances:none  Narrative Interpretation:   Old EKG Reviewed: none available  CRITICAL CARE Performed by: Glynn Octave   Total critical care time: 30  Critical care time was exclusive of separately billable procedures and treating other patients.  Critical care was necessary to treat or prevent imminent or life-threatening deterioration.  Critical care was time spent personally by me on the following activities: development of treatment plan with patient and/or surrogate as well as nursing, discussions with consultants, evaluation of patient's response to treatment, examination of patient, obtaining history from patient or surrogate, ordering and performing treatments and interventions, ordering and review of laboratory studies, ordering and review of radiographic studies, pulse oximetry and re-evaluation of patient's condition.    I personally performed the services described in this documentation, which was scribed in my presence. The recorded information has been reviewed and is accurate.     Glynn Octave, MD 12/15/11 1736

## 2011-12-15 NOTE — ED Notes (Signed)
Pt taken to Interventional radiology, report given at bedside.

## 2011-12-15 NOTE — ED Notes (Signed)
Bed request made

## 2011-12-15 NOTE — Code Documentation (Addendum)
Brendan Scott is a 66 yo male whose grandson saw him leave for work at 1430 in his usual state of health.  At 1500, the grandson himself left for work and found pt down in driveway, unresponsive.  9-1-1 was called. On EMS arrival pt was standing, but drooling, with L facial droop, slurred speech and LUE weakness.  BP=240/120, CBG 270s.  They activated code stroke at 1525. Stroke team arrived at 1530; Pt arrived at [facility] at 1543, cleared by EDP at 1544; to CT which showed slightly dense RMCA with suggestion of early acute infarct and also a large hyperdense mass centered at L clinoid with ext into L middle cranial fossa, L cavernous sinus, L subfrontal region suggestive of meningioma.  Pt is alert, follows commands, can speak, but very dysarthric; NIHSS is 8, with L neglect, R gaze preference and L visual field cut. His LUE drifts but does not hit bed. (see doc flowsheet).  He was deemed not to be a tPA candidate d/t prob. Meningioma.  With NIHSS=8, decision made to go to IR for angio. Wife present; she and pt updated, and agreeable with the plan. Handoff given to IR nurse.

## 2011-12-15 NOTE — ED Notes (Addendum)
Per ems pt. Last seen normal- 1430. Pt. Mother-in-law found pt outside home @ 1444. Pt had garbled speech, able to answer y/n questions. Left facial droop, left weak hand grip and dragging left leg. Pt. Oriented to self. Initial BP 208/109 then 181/95 then 190/102. Pt has hx. Of diabetes CBG 273

## 2011-12-15 NOTE — Procedures (Signed)
S/P 4 vessel cerebral arteriogram. RT CFA approach. Preliminary findings. 1. No occlusions ,stenosis dissections or intraluminal filling defects seen . 2. Venous outflow WNLs.

## 2011-12-16 ENCOUNTER — Inpatient Hospital Stay (HOSPITAL_COMMUNITY): Payer: BC Managed Care – PPO

## 2011-12-16 ENCOUNTER — Encounter (HOSPITAL_COMMUNITY): Payer: Self-pay | Admitting: Neurology

## 2011-12-16 DIAGNOSIS — IMO0001 Reserved for inherently not codable concepts without codable children: Secondary | ICD-10-CM | POA: Diagnosis not present

## 2011-12-16 DIAGNOSIS — G40109 Localization-related (focal) (partial) symptomatic epilepsy and epileptic syndromes with simple partial seizures, not intractable, without status epilepticus: Secondary | ICD-10-CM | POA: Diagnosis present

## 2011-12-16 LAB — HEMOGLOBIN A1C
Hgb A1c MFr Bld: 6.9 % — ABNORMAL HIGH (ref <5.7)
Mean Plasma Glucose: 151 mg/dL — ABNORMAL HIGH (ref <117)

## 2011-12-16 LAB — LIPID PANEL
Cholesterol: 184 mg/dL (ref 0–200)
HDL: 60 mg/dL (ref >39)
Total CHOL/HDL Ratio: 3.1 RATIO

## 2011-12-16 LAB — GLUCOSE, CAPILLARY: Glucose-Capillary: 100 mg/dL — ABNORMAL HIGH (ref 70–99)

## 2011-12-16 MED ORDER — GADOBENATE DIMEGLUMINE 529 MG/ML IV SOLN
20.0000 mL | Freq: Once | INTRAVENOUS | Status: AC | PRN
  Administered 2011-12-16: 20 mL via INTRAVENOUS

## 2011-12-16 MED ORDER — SODIUM CHLORIDE 0.9 % IV SOLN
500.0000 mg | Freq: Once | INTRAVENOUS | Status: AC
  Administered 2011-12-16: 500 mg via INTRAVENOUS
  Filled 2011-12-16: qty 10

## 2011-12-16 MED ORDER — SODIUM CHLORIDE 0.9 % IV SOLN
1000.0000 mg | Freq: Three times a day (TID) | INTRAVENOUS | Status: DC
  Administered 2011-12-16 – 2011-12-18 (×6): 1000 mg via INTRAVENOUS
  Filled 2011-12-16 (×8): qty 10

## 2011-12-16 MED ORDER — INFLUENZA VIRUS VACC SPLIT PF IM SUSP
0.5000 mL | INTRAMUSCULAR | Status: AC
  Administered 2011-12-17: 0.5 mL via INTRAMUSCULAR
  Filled 2011-12-16: qty 0.5

## 2011-12-16 MED ORDER — LORAZEPAM 2 MG/ML IJ SOLN
2.0000 mg | Freq: Once | INTRAMUSCULAR | Status: DC
  Filled 2011-12-16 (×3): qty 1

## 2011-12-16 MED ORDER — SODIUM CHLORIDE 0.9 % IV SOLN
INTRAVENOUS | Status: DC
  Administered 2011-12-16: 1000 mL via INTRAVENOUS
  Administered 2011-12-17: 75 mL/h via INTRAVENOUS
  Administered 2011-12-18: 1000 mL via INTRAVENOUS
  Administered 2011-12-19 – 2011-12-26 (×9): via INTRAVENOUS
  Administered 2011-12-27: 75 mL/h via INTRAVENOUS

## 2011-12-16 MED ORDER — ENOXAPARIN SODIUM 40 MG/0.4ML ~~LOC~~ SOLN
40.0000 mg | SUBCUTANEOUS | Status: DC
  Administered 2011-12-16 – 2011-12-27 (×12): 40 mg via SUBCUTANEOUS
  Filled 2011-12-16 (×12): qty 0.4

## 2011-12-16 MED ORDER — ASPIRIN 300 MG RE SUPP
300.0000 mg | Freq: Every day | RECTAL | Status: DC
  Administered 2011-12-16 – 2011-12-19 (×4): 300 mg via RECTAL
  Filled 2011-12-16 (×5): qty 1

## 2011-12-16 MED ORDER — SODIUM CHLORIDE 0.9 % IV SOLN
100.0000 mg | Freq: Once | INTRAVENOUS | Status: AC
  Administered 2011-12-16: 100 mg via INTRAVENOUS
  Filled 2011-12-16: qty 10

## 2011-12-16 MED ORDER — LORAZEPAM 2 MG/ML IJ SOLN
INTRAMUSCULAR | Status: AC
  Administered 2011-12-16: 2 mg
  Filled 2011-12-16: qty 1

## 2011-12-16 MED ORDER — PHENYTOIN SODIUM 50 MG/ML IJ SOLN
100.0000 mg | Freq: Three times a day (TID) | INTRAMUSCULAR | Status: DC
  Administered 2011-12-16 – 2011-12-26 (×31): 100 mg via INTRAVENOUS
  Filled 2011-12-16 (×33): qty 2

## 2011-12-16 NOTE — Progress Notes (Signed)
Stroke Team Progress Note  HISTORY  Brendan Scott is an 66 y.o. male who was last seen normal at 14:30 go to work. Patient never showed up to work. Family then found him in driveway unresponsive and called EMS. EMS arrived to find patient standing in driveway slightly confused, left facial droop and dysarthria. Code stroke was called and patient was brought to [facility] hospital. Patient remained with left facial droop, right gaze preference, unable to cross midline, dysarthria and left hemianopsia. CT scan of the head without contrast showed evidence of a fairly large mass lesion involving the left frontotemporal region as well as possible increased density of the right middle cerebral artery proximally, and possibly early MCA territory infarction. Mass lesion on the left was thought to be incidental and likely a meningioma. As such, he was not considered a candidate for intravenous thrombolytic therapy with TPA. Patient was subsequently taken to Interventional Radiology for cerebral arteriogram was performed which showed no signs of occlusion of the right middle cerebral artery nor right internal carotid artery. There was mass effect involving the left middle cerebral artery with reduced diameter and straightening. Chronic permanent tumor blush was also seen. NIH stroke score was 8, including visual field defect and neglect of his left side as well as facial weakness and extremity weakness, and slurred speech. Modified Rankin score was 3. Patient was taking aspirin 81 mg per day prior to admission.   SUBJECTIVE His wife is at the bedside. Overall he feels his condition is unchanged, but now he is having frequent left facial twitching.  OBJECTIVE Most recent Vital Signs: Temp: 98.3 F (36.8 C) (11/16 0800) Temp src: Oral (11/16 0800) BP: 144/65 mmHg (11/16 0700) Pulse Rate: 70  (11/16 0700) Respiratory Rate: 23 O2 Saturation: 100%  CBG (last 3)  Basename 12/16/11 0310  GLUCAP 100*   Intake/Output  from previous day: 11/15 0701 - 11/16 0700 In: 900 [I.V.:900] Out: 1170 [Urine:1170]  IV Fluid Intake:     . [EXPIRED] sodium chloride 1,000 mL (12/16/11 0350)   Medications    . influenza  inactive virus vaccine  0.5 mL Intramuscular Tomorrow-1000  . [COMPLETED] lacosamide (VIMPAT) IV  100 mg Intravenous Once  . levetiracetam  1,000 mg Intravenous Q12H  . [COMPLETED] levetiracetam  1,500 mg Intravenous STAT  . LORazepam      . [COMPLETED] LORazepam      . [COMPLETED] LORazepam  2 mg Intravenous Once  . LORazepam  2 mg Intravenous Once  . senna-docusate  1 tablet Oral QHS  PRN fentaNYL, [COMPLETED] iohexol, midazolam  Diet:  NPO  Activity:  Bedrest  DVT Prophylaxis:  SCD  Significant Diagnostic Studies: CBC    Component Value Date/Time   WBC 11.8* 12/15/2011 1550   RBC 5.50 12/15/2011 1550   HGB 16.3 12/15/2011 1604   HCT 48.0 12/15/2011 1604   PLT 209 12/15/2011 1550   MCV 81.1 12/15/2011 1550   MCH 26.4 12/15/2011 1550   MCHC 32.5 12/15/2011 1550   RDW 15.8* 12/15/2011 1550   LYMPHSABS 1.5 12/15/2011 1550   MONOABS 1.0 12/15/2011 1550   EOSABS 0.1 12/15/2011 1550   BASOSABS 0.0 12/15/2011 1550   CMP    Component Value Date/Time   NA 139 12/15/2011 1604   K 4.0 12/15/2011 1604   CL 104 12/15/2011 1604   CO2 25 12/15/2011 1550   GLUCOSE 229* 12/15/2011 1604   BUN 15 12/15/2011 1604   CREATININE 1.30 12/15/2011 1604   CALCIUM 9.6 12/15/2011 1550  PROT 7.5 12/15/2011 1550   ALBUMIN 4.1 12/15/2011 1550   AST 26 12/15/2011 1550   ALT 36 12/15/2011 1550   ALKPHOS 76 12/15/2011 1550   BILITOT 0.3 12/15/2011 1550   GFRNONAA 69* 12/15/2011 1550   GFRAA 80* 12/15/2011 1550   COAGS Lab Results  Component Value Date   INR 0.98 12/15/2011   INR 1.45 08/13/2009   INR 1.50* 08/12/2009   Lipid Panel    Component Value Date/Time   CHOL 184 12/16/2011 0430   TRIG 51 12/16/2011 0430   HDL 60 12/16/2011 0430   CHOLHDL 3.1 12/16/2011 0430   VLDL 10 12/16/2011  0430   LDLCALC 114* 12/16/2011 0430   HgbA1C  No results found for this basename: HGBA1C   Urine Drug Screen  No results found for this basename: labopia, cocainscrnur, labbenz, amphetmu, thcu, labbarb    Alcohol Level No results found for this basename: eth     Results for orders placed during the hospital encounter of 12/15/11 (from the past 24 hour(s))  PROTIME-INR     Status: Normal   Collection Time   12/15/11  3:50 PM      Component Value Range   Prothrombin Time 12.9  11.6 - 15.2 seconds   INR 0.98  0.00 - 1.49  APTT     Status: Normal   Collection Time   12/15/11  3:50 PM      Component Value Range   aPTT 27  24 - 37 seconds  CBC     Status: Abnormal   Collection Time   12/15/11  3:50 PM      Component Value Range   WBC 11.8 (*) 4.0 - 10.5 K/uL   RBC 5.50  4.22 - 5.81 MIL/uL   Hemoglobin 14.5  13.0 - 17.0 g/dL   HCT 16.1  09.6 - 04.5 %   MCV 81.1  78.0 - 100.0 fL   MCH 26.4  26.0 - 34.0 pg   MCHC 32.5  30.0 - 36.0 g/dL   RDW 40.9 (*) 81.1 - 91.4 %   Platelets 209  150 - 400 K/uL  DIFFERENTIAL     Status: Abnormal   Collection Time   12/15/11  3:50 PM      Component Value Range   Neutrophils Relative 79 (*) 43 - 77 %   Neutro Abs 9.2 (*) 1.7 - 7.7 K/uL   Lymphocytes Relative 12  12 - 46 %   Lymphs Abs 1.5  0.7 - 4.0 K/uL   Monocytes Relative 8  3 - 12 %   Monocytes Absolute 1.0  0.1 - 1.0 K/uL   Eosinophils Relative 1  0 - 5 %   Eosinophils Absolute 0.1  0.0 - 0.7 K/uL   Basophils Relative 0  0 - 1 %   Basophils Absolute 0.0  0.0 - 0.1 K/uL  COMPREHENSIVE METABOLIC PANEL     Status: Abnormal   Collection Time   12/15/11  3:50 PM      Component Value Range   Sodium 138  135 - 145 mEq/L   Potassium 4.2  3.5 - 5.1 mEq/L   Chloride 102  96 - 112 mEq/L   CO2 25  19 - 32 mEq/L   Glucose, Bld 237 (*) 70 - 99 mg/dL   BUN 14  6 - 23 mg/dL   Creatinine, Ser 7.82  0.50 - 1.35 mg/dL   Calcium 9.6  8.4 - 95.6 mg/dL   Total Protein 7.5  6.0 - 8.3  g/dL    Albumin 4.1  3.5 - 5.2 g/dL   AST 26  0 - 37 U/L   ALT 36  0 - 53 U/L   Alkaline Phosphatase 76  39 - 117 U/L   Total Bilirubin 0.3  0.3 - 1.2 mg/dL   GFR calc non Af Amer 69 (*) >90 mL/min   GFR calc Af Amer 80 (*) >90 mL/min  TROPONIN I     Status: Normal   Collection Time   12/15/11  3:50 PM      Component Value Range   Troponin I <0.30  <0.30 ng/mL  POCT I-STAT, CHEM 8     Status: Abnormal   Collection Time   12/15/11  4:04 PM      Component Value Range   Sodium 139  135 - 145 mEq/L   Potassium 4.0  3.5 - 5.1 mEq/L   Chloride 104  96 - 112 mEq/L   BUN 15  6 - 23 mg/dL   Creatinine, Ser 7.82  0.50 - 1.35 mg/dL   Glucose, Bld 956 (*) 70 - 99 mg/dL   Calcium, Ion 2.13  0.86 - 1.30 mmol/L   TCO2 24  0 - 100 mmol/L   Hemoglobin 16.3  13.0 - 17.0 g/dL   HCT 57.8  46.9 - 62.9 %  MRSA PCR SCREENING     Status: Normal   Collection Time   12/15/11  6:29 PM      Component Value Range   MRSA by PCR NEGATIVE  NEGATIVE  GLUCOSE, CAPILLARY     Status: Abnormal   Collection Time   12/16/11  3:10 AM      Component Value Range   Glucose-Capillary 100 (*) 70 - 99 mg/dL  LIPID PANEL     Status: Abnormal   Collection Time   12/16/11  4:30 AM      Component Value Range   Cholesterol 184  0 - 200 mg/dL   Triglycerides 51  <528 mg/dL   HDL 60  >41 mg/dL   Total CHOL/HDL Ratio 3.1     VLDL 10  0 - 40 mg/dL   LDL Cholesterol 324 (*) 0 - 99 mg/dL    Ct Head Wo Contrast  12/16/2011  *RADIOLOGY REPORT*  Clinical Data: Unable to wake up.  Stroke.  CT HEAD WITHOUT CONTRAST  Technique:  Contiguous axial images were obtained from the base of the skull through the vertex without contrast.  Comparison: 12/15/2011.  Findings: Exam is markedly motion degraded.  Findings raise possibility of progressive right hemispheric infarct. Mild local mass effect.  Limited for detection of intracranial hemorrhage.  Left anterior left temporal lobe/ clinoid/cavernous sinus region hyperdense lesions suggestive of  a meningioma.  This can be evaluated with contrast enhanced MR.  IMPRESSION: Exam is markedly motion degraded.  Findings raise possibility of progressive right hemispheric infarct.  Limited for detection of intracranial hemorrhage.  Left anterior left temporal lobe/ clinoid/cavernous sinus region hyperdense lesions suggestive of a meningioma.  This can be evaluated with contrast enhanced MR.  Critical Value/emergent results were called by telephone at the time of interpretation on 12/16/2011 at 7:20 a.m. to Tammy patients nurse. , who verbally acknowledged these results.   Original Report Authenticated By: Lacy Duverney, M.D.    Ct Head (brain) Wo Contrast  12/15/2011  *RADIOLOGY REPORT*  Clinical Data: Left-sided facial droop.  Diabetic.  CT HEAD WITHOUT CONTRAST  Technique:  Contiguous axial images were obtained from the base of the skull  through the vertex without contrast.  Comparison: None.  Findings: Right middle cerebral artery slightly dense.  There is minimal hypodensity right opercular region. This may reflect changes of early acute infarct.  Large hyperdense mass centered at the left clinoid with extension into the left middle cranial fossa, left cavernous sinus, left subfrontal region and orbital apex region having appearance suggestive of a meningioma with bony remodeling.  This can be assessed with contrast enhanced MR.   Hypodensity adjacent left frontal lobe may reflect vasogenic edema.  No intracranial hemorrhage.  No hydrocephalus.  Exophthalmos.  Prior right mastoidectomy.  Mild thickening along the peripheral aspect of the mastoidectomy site.  Partial opacification of residual right mastoid air cells.  IMPRESSION: Right middle cerebral artery slightly dense.  There is minimal hypodensity right opercular region. This may reflect changes of early acute infarct.  Large hyperdense mass centered at the left clinoid with extension into the left middle cranial fossa, left cavernous sinus, left  subfrontal region and orbital apex region having appearance suggestive of a meningioma with bony remodeling.  This can be assessed with contrast enhanced MR.   Hypodensity adjacent left frontal lobe may reflect vasogenic edema.  No intracranial hemorrhage.  Prior right mastoidectomy.  Mild thickening along the peripheral aspect of the mastoidectomy site.  Partial opacification of residual right mastoid air cells.  This is a code stroke with results called to Dr. Roseanne Reno 12/15/2011 4:00 p.m.   Original Report Authenticated By: Lacy Duverney, M.D.    Dg Chest Port 1 View  12/15/2011  *RADIOLOGY REPORT*  Clinical Data: Recent stroke  PORTABLE CHEST - 1 VIEW  Comparison: None.  Findings: Slight cardiac enlargement.  Clear lung fields.  No active infiltrates or significant effusions.  No pneumothorax. Negative osseous structures.  IMPRESSION: Mild cardiac enlargement.  No active infiltrates or failure.   Original Report Authenticated By: Davonna Belling, M.D.     CT of the brain   IMPRESSION:  Right middle cerebral artery slightly dense. There is minimal  hypodensity right opercular region. This may reflect changes of  early acute infarct.  Large hyperdense mass centered at the left clinoid with extension  into the left middle cranial fossa, left cavernous sinus, left  subfrontal region and orbital apex region having appearance  suggestive of a meningioma with bony remodeling. This can be  assessed with contrast enhanced MR. Hypodensity adjacent left  frontal lobe may reflect vasogenic edema.  No intracranial hemorrhage.  Prior right mastoidectomy. Mild thickening along the peripheral  aspect of the mastoidectomy site. Partial opacification of  residual right mastoid air cells.   CT angio  Not ordered  MRI of the brain  Pending  MRA of the brain  Not ordered-Cerebral angio done  2D Echocardiogram  Pending  Carotid Doppler  Not ordered Cerebral angio done.  CXR   IMPRESSION:  Mild cardiac  enlargement. No active infiltrates or failure.   EKG   ATRIAL FIBRILLATION, V-RATE 68- 92 ~ var'd rate, irreg atrial activity LATERAL INFARCT, OLD ~ Q>79mS, abnormal ST-T, I aVL V5-6 ABNRM R PROG, CONSIDER ASMI OR LEAD PLACEMENT ~ Q >53mS, diminished R, V2  Physical Exam    Patient is alert and cooperative at the time of examination he  Frequent twitching of the left face is noted.  Speech is dysarthric, not aphasic.  Respiratory examination is clear.  Cardiovascular examination reveals an occasional irregular heart rhythm, no obvious murmurs or rubs are noted.  No carotid bruits are noted.  Abdomen is obese,  soft and nontender, positive bowel sounds.  Extremities are without significant edema  The patient has clear left facial droop, some conjugate eye deviation to the right. The patient has a dense left homonymous visual field deficit.  The patient has a left hemiparesis, left arm greater than left leg. Right side appears to have normal strength.  No obvious ataxia seen with the extremities.  The patient could not be ambulated.  Deep tendon reflexes are symmetric.    ASSESSMENT Brendan Scott is a 66 y.o. male with a right brain stroke, secondary to cardioembolic event. On aspirin 81 mg orally every day for secondary stroke prevention.  Stroke risk factors:  atrial fibrillation and diabetes mellitus The patient has a left brain meningioma by CT, MRI is pending. The patient is in atrial fibrillation, and this is the likely source of the right brain stroke. The meningioma is likely an ancillary finding. The patient appears to have focal status epilepticus. The patient will require more aggressive antiepileptic therapy at this time. The patient may require anticoagulation in the future. The patient is currently n.p.o., speech therapy is to evaluate. The focal seizures could potentially have some impact on the ability to swallow. Stroke workup is pending. Cerebral angiogram  did not show large vessel blockages. I   Hospital day # 1  TREATMENT/PLAN  -MRI of the brain -EEG -increase keppra dose, consider adding dilantin -2D echo -ST, PT, OT eval -aspirin, lovenox -NSGY eval for left frontal and anterior temporal meningioma   Lesly Dukes

## 2011-12-16 NOTE — Progress Notes (Signed)
Physical Therapy Treatment Patient Details Name: Brendan Scott MRN: [id number] DOB: Sep 23, 1945 Today's Date: 12/16/2011 Time: 4098-1191 PT Time Calculation (min): 13 min  PT Assessment / Plan / Recommendation Comments on Treatment Session  Pt presenting with L neglect and decreased awareness as well as decreased awareness of deficits. Continue per plan, still recommending CIR. Goals added from evaluation this morning    Follow Up Recommendations  CIR;Supervision/Assistance - 24 hour     Does the patient have the potential to tolerate intense rehabilitation     Barriers to Discharge        Equipment Recommendations  Rolling walker with 5" wheels    Recommendations for Other Services Rehab consult  Frequency Min 4X/week   Plan Frequency remains appropriate;Discharge plan needs to be updated    Precautions / Restrictions Precautions Precautions: Fall Restrictions Weight Bearing Restrictions: No   Pertinent Vitals/Pain Pt with no pain complaints    Mobility  Bed Mobility Bed Mobility: Supine to Sit;Sitting - Scoot to Edge of Bed Rolling Right: 1: +1 Total assist Rolling Left: 3: Mod assist Supine to Sit: 3: Mod assist Sitting - Scoot to Edge of Bed: 3: Mod assist Details for Bed Mobility Assistance: Cues for sequencing. Pt completed first half independently then required increased cueing and assistance for completing into sitting. Assist with LLE and control of LUE secondary to ataxia. Transfers Transfers: Sit to Stand;Stand to Sit;Stand Pivot Transfers Sit to Stand: 1: +2 Total assist;With upper extremity assist;From bed Sit to Stand: Patient Percentage: 40% Stand to Sit: 1: +2 Total assist;With upper extremity assist;To chair/3-in-1 Stand to Sit: Patient Percentage: 30% Stand Pivot Transfers: 1: +2 Total assist Stand Pivot Transfers: Patient Percentage: 30% Details for Transfer Assistance: Assist x 2 for stability and initiation of movement. Assist with control of LLE as  ataxic movements. Pts leg initially stuck in the leg of bed and pt unaware, required assist of therapist to get out of bedrail. Cues for hand placement and sequencing throughout Ambulation/Gait Ambulation/Gait Assistance: Not tested (comment) Modified Rankin (Stroke Patients Only) Pre-Morbid Rankin Score: No symptoms Modified Rankin: Severe disability    Exercises     PT Diagnosis: Difficulty walking;Altered mental status  PT Problem List: Decreased strength;Decreased activity tolerance;Decreased mobility;Decreased knowledge of use of DME;Decreased safety awareness;Decreased knowledge of precautions PT Treatment Interventions: DME instruction;Gait training;Stair training;Functional mobility training;Therapeutic activities;Therapeutic exercise;Balance training;Neuromuscular re-education;Patient/family education   PT Goals Acute Rehab PT Goals PT Goal Formulation: With patient Time For Goal Achievement: 12/30/11 Potential to Achieve Goals: Fair Pt will Roll Supine to Right Side: with modified independence PT Goal: Rolling Supine to Right Side - Progress: Progressing toward goal Pt will Roll Supine to Left Side: with modified independence PT Goal: Rolling Supine to Left Side - Progress: Progressing toward goal Pt will go Supine/Side to Sit: with supervision PT Goal: Supine/Side to Sit - Progress: Progressing toward goal Pt will go Sit to Supine/Side: with supervision PT Goal: Sit to Supine/Side - Progress: Progressing toward goal Pt will go Sit to Stand: with min assist PT Goal: Sit to Stand - Progress: Goal set today Pt will go Stand to Sit: with min assist PT Goal: Stand to Sit - Progress: Goal set today Pt will Transfer Bed to Chair/Chair to Bed: with mod assist PT Transfer Goal: Bed to Chair/Chair to Bed - Progress: Goal set today Pt will Ambulate: 16 - 50 feet;with mod assist;with least restrictive assistive device PT Goal: Ambulate - Progress: Goal set today  Visit Information   Last  PT Received On: 12/16/11 Assistance Needed: +2    Subjective Data  Patient Stated Goal: to get better   Cognition  Overall Cognitive Status: Impaired Area of Impairment: Safety/judgement Arousal/Alertness: Awake/alert Orientation Level: Appears intact for tasks assessed Behavior During Session: [facility] for tasks performed Safety/Judgement: Decreased awareness of need for assistance;Decreased safety judgement for tasks assessed    Balance  Balance Balance Assessed: Yes Static Standing Balance Static Standing - Balance Support: Bilateral upper extremity supported;During functional activity Static Standing - Level of Assistance: 1: +2 Total assist Static Standing - Comment/# of Minutes: Pt unable to maintain standing for >10 seconds while hygiene completed with +2 assistance for control  End of Session PT - End of Session Equipment Utilized During Treatment: Gait belt Activity Tolerance: Patient tolerated treatment well Patient left: in chair;with call bell/phone within reach;with nursing in room;with family/visitor present Nurse Communication: Mobility status   GP     Milana Kidney 12/16/2011, 4:19 PM

## 2011-12-16 NOTE — Progress Notes (Signed)
Portable EEG completed

## 2011-12-16 NOTE — Evaluation (Signed)
Physical Therapy Evaluation Patient Details Name: Brendan Scott MRN: [id number] DOB: Jan 29, 1946 Today's Date: 12/16/2011 Time:  -     PT Assessment / Plan / Recommendation Clinical Impression  Pt admitted with l sided weakness and confusion, found to R MCA CVA. Evaluation limited secondary to pt using bedpan folling rolling. Will continue evaluation later this afternoon and attempt ambulation. Pt with good LEs although still partially with L neglect and awareness of deficits. Will update goals as necessary    PT Assessment  Patient needs continued PT services    Follow Up Recommendations  Home health PT;Supervision/Assistance - 24 hour    Does the patient have the potential to tolerate intense rehabilitation      Barriers to Discharge        Equipment Recommendations  Other (comment) (TBD)    Recommendations for Other Services     Frequency Min 4X/week    Precautions / Restrictions Precautions Precautions: Fall Restrictions Weight Bearing Restrictions: No   Pertinent Vitals/Pain No complaints of pain      Mobility  Bed Mobility Bed Mobility: Rolling Right;Rolling Left Rolling Right: 1: +1 Total assist Rolling Left: 3: Mod assist Details for Bed Mobility Assistance: Pt able to assist with rolling using RUE and trunk, although required total assist with drawsheet rolling right. Assisted RN with patient on bedpan Transfers Transfers: Not assessed Modified Rankin (Stroke Patients Only) Pre-Morbid Rankin Score: No symptoms Modified Rankin: Severe disability    Shoulder Instructions     Exercises     PT Diagnosis: Difficulty walking;Altered mental status  PT Problem List: Decreased strength;Decreased activity tolerance;Decreased mobility;Decreased knowledge of use of DME;Decreased safety awareness;Decreased knowledge of precautions PT Treatment Interventions: DME instruction;Gait training;Stair training;Functional mobility training;Therapeutic activities;Therapeutic  exercise;Balance training;Neuromuscular re-education;Patient/family education   PT Goals Acute Rehab PT Goals PT Goal Formulation: With patient Time For Goal Achievement: 12/30/11 Potential to Achieve Goals: Fair Pt will Roll Supine to Right Side: with modified independence PT Goal: Rolling Supine to Right Side - Progress: Goal set today Pt will Roll Supine to Left Side: with modified independence PT Goal: Rolling Supine to Left Side - Progress: Goal set today Pt will go Supine/Side to Sit: with supervision PT Goal: Supine/Side to Sit - Progress: Goal set today Pt will go Sit to Supine/Side: with supervision PT Goal: Sit to Supine/Side - Progress: Goal set today  Visit Information  Last PT Received On: 12/16/11 Assistance Needed: +2    Subjective Data  Patient Stated Goal: to get better   Prior Functioning  Home Living Lives With: Spouse Available Help at Discharge: Family;Available 24 hours/day Type of Home: Mobile home Home Access: Stairs to enter Entrance Stairs-Number of Steps: 4 Entrance Stairs-Rails: Right;Left;Can reach both Home Layout: One level Bathroom Shower/Tub: Walk-in shower;Door [employer] Toilet: Standard Bathroom Accessibility: Yes How Accessible: Accessible via walker Home Adaptive Equipment: Walker - rolling;Shower chair without back Prior Function Level of Independence: Independent Able to Take Stairs?: Yes Driving: Yes Vocation: Full time employment Comments: custodian Communication Communication: Expressive difficulties Dominant Hand: Right    Cognition  Overall Cognitive Status: Impaired Area of Impairment: Safety/judgement Arousal/Alertness: Awake/alert Orientation Level: Appears intact for tasks assessed Behavior During Session: WFL for tasks performed Safety/Judgement: Decreased awareness of need for assistance;Decreased safety judgement for tasks assessed    Extremity/Trunk Assessment Right Lower Extremity Assessment RLE  ROM/Strength/Tone: Within functional levels (MMT 5/5) RLE Sensation: WFL - Light Touch Left Lower Extremity Assessment LLE ROM/Strength/Tone: Within functional levels (MMT 5/5) LLE Sensation: WFL - Light  Touch   Balance    End of Session PT - End of Session Activity Tolerance: Other (comment) (limited secondary to patient using bathroom) Patient left: in bed;with call bell/phone within reach;Other (comment) (NT in room) Nurse Communication: Mobility status   Milana Kidney 12/16/2011, 1:30 PM  12/16/2011 Milana Kidney DPT PAGER: [phone] OFFICE: [phone]

## 2011-12-16 NOTE — Evaluation (Signed)
Clinical/Bedside Swallow Evaluation Patient Details  Name: Brendan Scott MRN: [id number] Date of Birth: 05-05-1945  Today's Date: 12/16/2011 Time: 1700-1730 SLP Time Calculation (min): 30 min  Past Medical History:  Past Medical History  Diagnosis Date  . Arthritis   . Diabetes mellitus   . Atrial fibrillation    Past Surgical History:  Past Surgical History  Procedure Date  . Total knee arthroplasty     Bilateral   HPI:  66 y/o  found in driveway unresponsive by family and called EMS.  Patient admitted to [facility] ED with left facial droop, right gaze preference, unable to cross midline, dysarthria and left hemianopsia, CT showed evidence of large mass lesion involving the left frontotemporal region as well as possible increased density .  Mass lesion thought to be meningioma. MRI: L Large acute non hemorrhagic right hemispheric infarct involving portions of the right frontal lobe, right temporal lobe, right opercular and sub insular region extending to the border with right parietal lobe. ? tiny infarct left temporal lobe. Patient referred for BSE per stroke protocol.   Assessment / Plan / Recommendation Clinical Impression  Moderate oral dysphagia marked by left sided facial, labial, and lingual weakness. Moderate pharyngeal dysphagia marked by delay in initiation and decreased hyoid laryngeal elevation.  Immediate throat clears noted with ice chips and thin liquids with delayed wet cough. Patient's upper partial  removed prior to evaluation due to loose fit and unable to secure .  Solids not attempted due to noted weakness and decreased LOA.  Recommend to proceed with dysphagia 1 and nectar thick liquids with full supervision with all meals due to noted left sided neglect and cognitive deficit.  ST to follow for diet tolerance and possible diet advancement. Completion of objective evaluation to be determined.      Aspiration Risk  Moderate    Diet Recommendation Dysphagia 1  (Puree);Nectar-thick liquid   Liquid Administration via: Cup Medication Administration: Crushed with puree Supervision: Full supervision/cueing for compensatory strategies;Staff feed patient Compensations: Slow rate;Small sips/bites;Clear throat intermittently Postural Changes and/or Swallow Maneuvers: Seated upright 90 degrees;Upright 30-60 min after meal    Other  Recommendations Oral Care Recommendations: Oral care QID Other Recommendations: Order thickener from pharmacy;Clarify dietary restrictions;Prohibited food (jello, ice cream, thin soups);Remove water pitcher;Have oral suction available   Follow Up Recommendations  Inpatient Rehab    Frequency and Duration min 2x/week  2 weeks       SLP Swallow Goals Patient will consume recommended diet without observed clinical signs of aspiration with: Moderate assistance Patient will utilize recommended strategies during swallow to increase swallowing safety with: Moderate assistance   Swallow Study Prior Functional Status   Lived at home     General Date of Onset: 12/15/11 HPI: 66 y/o  found in driveway unresponsive by family and called EMS.  Patient admitted [facility] ED with left facial droop, right gaze preference, unable to cross midline, dysarthria and left hemianopsia, CT showed evidence of large mass lesion involving the left frontotemporal region as well as possible increased density .  Mass lesion thought to be meningioma. MRI L Large acute non hemorrhagic right hemispheric infarct involving portions of the right frontal lobe, right temporal lobe, right opercular and sub insular region extending to the border with right parietal lobe. ? tiny infarct left temporal lobe. Patient referred  Type of Study: Bedside swallow evaluation Diet Prior to this Study: NPO Temperature Spikes Noted: No Respiratory Status: Room air History of Recent Intubation: No Behavior/Cognition: Cooperative;Confused;Distractible;Requires  cueing;Decreased  sustained attention Oral Cavity - Dentition: Dentures, top Self-Feeding Abilities: Needs assist Patient Positioning: Upright in bed Baseline Vocal Quality: Hoarse Volitional Cough: Strong Volitional Swallow: Able to elicit    Oral/Motor/Sensory Function Overall Oral Motor/Sensory Function: Impaired Labial ROM: Reduced left Labial Symmetry: Abnormal symmetry left Labial Strength: Reduced Labial Sensation: Reduced Lingual ROM: Reduced left Lingual Symmetry: Abnormal symmetry left Lingual Strength: Reduced Lingual Sensation: Reduced Facial ROM: Reduced left Facial Symmetry: Left droop Facial Strength: Reduced Facial Sensation: Reduced Velum: Within Functional Limits Mandible: Within Functional Limits   Ice Chips Ice chips: Impaired Presentation: Spoon Pharyngeal Phase Impairments: Suspected delayed Swallow;Decreased hyoid-laryngeal movement;Throat Clearing - Delayed;Throat Clearing - Immediate   Thin Liquid Thin Liquid: Impaired Presentation: Cup;Spoon Oral Phase Functional Implications: Left anterior spillage;Prolonged oral transit Pharyngeal  Phase Impairments: Suspected delayed Swallow;Decreased hyoid-laryngeal movement;Throat Clearing - Immediate;Cough - Delayed    Nectar Thick Nectar Thick Liquid: Impaired Presentation: Cup;Spoon Oral Phase Impairments: Reduced lingual movement/coordination Oral phase functional implications: Left anterior spillage;Prolonged oral transit Pharyngeal Phase Impairments: Suspected delayed Swallow;Decreased hyoid-laryngeal movement   Honey Thick Honey Thick Liquid: Not tested   Puree Puree: Impaired Presentation: Self Fed Oral Phase Impairments: Reduced lingual movement/coordination Pharyngeal Phase Impairments: Suspected delayed Swallow;Decreased hyoid-laryngeal movement Other Comments: Patient with limited ability to accept spoon. able to put puree trials on tip of tongue   Solid   GO    Solid: Not tested      Moreen Fowler MS,  CCC-SLP [phone] [facility] 12/16/2011,7:10 PM

## 2011-12-16 NOTE — Progress Notes (Signed)
*  PRELIMINARY RESULTS* Echocardiogram 2D Echocardiogram has been performed.  Brendan Scott 12/16/2011, 12:42 PM

## 2011-12-17 LAB — COMPREHENSIVE METABOLIC PANEL
ALT: 22 U/L (ref 0–53)
AST: 20 U/L (ref 0–37)
Calcium: 9.1 mg/dL (ref 8.4–10.5)
Potassium: 3.5 mEq/L (ref 3.5–5.1)
Sodium: 139 mEq/L (ref 135–145)
Total Protein: 6.7 g/dL (ref 6.0–8.3)

## 2011-12-17 LAB — CBC WITH DIFFERENTIAL/PLATELET
Basophils Absolute: 0 10*3/uL (ref 0.0–0.1)
Basophils Relative: 0 % (ref 0–1)
Eosinophils Absolute: 0.1 10*3/uL (ref 0.0–0.7)
Eosinophils Relative: 1 % (ref 0–5)
HCT: 42.9 % (ref 39.0–52.0)
Lymphocytes Relative: 10 % — ABNORMAL LOW (ref 12–46)
MCH: 26.5 pg (ref 26.0–34.0)
MCHC: 32.4 g/dL (ref 30.0–36.0)
MCV: 81.7 fL (ref 78.0–100.0)
Monocytes Absolute: 1.1 10*3/uL — ABNORMAL HIGH (ref 0.1–1.0)
Platelets: 184 10*3/uL (ref 150–400)
RDW: 15.9 % — ABNORMAL HIGH (ref 11.5–15.5)

## 2011-12-17 LAB — PHENYTOIN LEVEL, TOTAL: Phenytoin Lvl: 7 ug/mL — ABNORMAL LOW (ref 10.0–20.0)

## 2011-12-17 MED ORDER — LACOSAMIDE 50 MG PO TABS: 50.0000 mg | ORAL_TABLET | Freq: Two times a day (BID) | ORAL | Status: DC

## 2011-12-17 MED ORDER — SODIUM CHLORIDE 0.9 % IV SOLN
100.0000 mg | Freq: Two times a day (BID) | INTRAVENOUS | Status: AC
  Administered 2011-12-17: 100 mg via INTRAVENOUS
  Filled 2011-12-17: qty 10

## 2011-12-17 MED ORDER — BIOTENE DRY MOUTH MT LIQD
15.0000 mL | Freq: Two times a day (BID) | OROMUCOSAL | Status: DC
Start: 2011-12-17 — End: 2011-12-27
  Administered 2011-12-17 – 2011-12-27 (×19): 15 mL via OROMUCOSAL

## 2011-12-17 MED ORDER — LORAZEPAM 2 MG/ML IJ SOLN
1.0000 mg | Freq: Once | INTRAMUSCULAR | Status: AC
  Administered 2011-12-17: 1 mg via INTRAVENOUS
  Filled 2011-12-17: qty 1

## 2011-12-17 MED ORDER — LORAZEPAM 2 MG/ML IJ SOLN
1.0000 mg | Freq: Once | INTRAMUSCULAR | Status: AC
  Administered 2011-12-17: 1 mg via INTRAVENOUS

## 2011-12-17 MED ORDER — SODIUM CHLORIDE 0.9 % IV SOLN: 10.0000 mg/kg | INTRAVENOUS | Status: DC

## 2011-12-17 MED ORDER — LORAZEPAM 2 MG/ML IJ SOLN
2.0000 mg | Freq: Once | INTRAMUSCULAR | Status: AC
  Administered 2011-12-17: 2 mg via INTRAVENOUS

## 2011-12-17 MED ORDER — CLONAZEPAM 0.5 MG PO TABS
0.2500 mg | ORAL_TABLET | Freq: Two times a day (BID) | ORAL | Status: DC
  Administered 2011-12-17 – 2011-12-19 (×5): 0.25 mg via ORAL
  Filled 2011-12-17 (×6): qty 1

## 2011-12-17 MED ORDER — SODIUM CHLORIDE 0.9 % IV SOLN: 100.0000 mg | INTRAVENOUS | Status: DC

## 2011-12-17 MED ORDER — SODIUM CHLORIDE 0.9 % IV SOLN
100.0000 mg | INTRAVENOUS | Status: AC
  Administered 2011-12-17: 100 mg via INTRAVENOUS
  Filled 2011-12-17: qty 10

## 2011-12-17 MED ORDER — SODIUM CHLORIDE 0.9 % IV SOLN
1000.0000 mg | INTRAVENOUS | Status: AC
  Administered 2011-12-17: 1000 mg via INTRAVENOUS
  Filled 2011-12-17: qty 20

## 2011-12-17 NOTE — Progress Notes (Signed)
Stroke Team Progress Note  HISTORY  Brendan Scott is an 66 y.o. male who was last seen normal at 14:30 go to work. Patient never showed up to work. Family then found him in driveway unresponsive and called EMS. EMS arrived to find patient standing in driveway slightly confused, left facial droop and dysarthria. Code stroke was called and patient was brought to [facility] hospital. Patient remained with left facial droop, right gaze preference, unable to cross midline, dysarthria and left hemianopsia. CT scan of the head without contrast showed evidence of a fairly large mass lesion involving the left frontotemporal region as well as possible increased density of the right middle cerebral artery proximally, and possibly early MCA territory infarction. Mass lesion on the left was thought to be incidental and likely a meningioma. As such, he was not considered a candidate for intravenous thrombolytic therapy with TPA. Patient was subsequently taken to Interventional Radiology for cerebral arteriogram was performed which showed no signs of occlusion of the right middle cerebral artery nor right internal carotid artery. There was mass effect involving the left middle cerebral artery with reduced diameter and straightening. Chronic permanent tumor blush was also seen. NIH stroke score was 8, including visual field defect and neglect of his left side as well as facial weakness and extremity weakness, and slurred speech. Modified Rankin score was 3. Patient was taking aspirin 81 mg per day prior to admission.   SUBJECTIVE His wife is at the bedside. Overall he feels his condition is unchanged, but now he is having frequent left facial twitching.  OBJECTIVE Most recent Vital Signs: Temp: 100 F (37.8 C) (11/17 0800) Temp src: Oral (11/17 0800) BP: 139/80 mmHg (11/17 0800) Pulse Rate: 69  (11/17 0800) Respiratory Rate: 22 O2 Saturation: 97%  CBG (last 3)   Basename 12/16/11 0310  GLUCAP 100*   Intake/Output  from previous day: 11/16 0701 - 11/17 0700 In: 2184.8 [I.V.:1668.8; IV Piggyback:516] Out: 2095 [Urine:2095]  IV Fluid Intake:      . sodium chloride 1,000 mL (12/16/11 2109)   Medications     . aspirin  300 mg Rectal Daily  . enoxaparin (LOVENOX) injection  40 mg Subcutaneous Q24H  . [COMPLETED] fosPHENYtoin (CEREBYX) IV  1,000 mg PE Intravenous STAT  . [COMPLETED] influenza  inactive virus vaccine  0.5 mL Intramuscular Tomorrow-1000  . levetiracetam  1,000 mg Intravenous Q8H  . LORazepam  2 mg Intravenous Once  . [COMPLETED] LORazepam  2 mg Intravenous Once  . [COMPLETED] phenytoin (DILANTIN) IV  500 mg Intravenous Once  . phenytoin (DILANTIN) IV  100 mg Intravenous Q8H  . senna-docusate  1 tablet Oral QHS  . [DISCONTINUED] fosPHENYtoin (CEREBYX) IV  10 mg PE/kg Intravenous STAT  . [DISCONTINUED] lacosamide (VIMPAT) IV  100 mg Intravenous STAT  . [DISCONTINUED] lacosamide  50 mg Oral BID  PRN fentaNYL, [COMPLETED] gadobenate dimeglumine, midazolam  Diet:  Dysphagia  Activity:  Up in chair  DVT Prophylaxis: Lovenox  Significant Diagnostic Studies: CBC    Component Value Date/Time   WBC 9.5 12/17/2011 0448   RBC 5.25 12/17/2011 0448   HGB 13.9 12/17/2011 0448   HCT 42.9 12/17/2011 0448   PLT 184 12/17/2011 0448   MCV 81.7 12/17/2011 0448   MCH 26.5 12/17/2011 0448   MCHC 32.4 12/17/2011 0448   RDW 15.9* 12/17/2011 0448   LYMPHSABS 0.9 12/17/2011 0448   MONOABS 1.1* 12/17/2011 0448   EOSABS 0.1 12/17/2011 0448   BASOSABS 0.0 12/17/2011 0448   CMP  Component Value Date/Time   NA 139 12/17/2011 0448   K 3.5 12/17/2011 0448   CL 103 12/17/2011 0448   CO2 26 12/17/2011 0448   GLUCOSE 141* 12/17/2011 0448   BUN 7 12/17/2011 0448   CREATININE 1.00 12/17/2011 0448   CALCIUM 9.1 12/17/2011 0448   PROT 6.7 12/17/2011 0448   ALBUMIN 3.5 12/17/2011 0448   AST 20 12/17/2011 0448   ALT 22 12/17/2011 0448   ALKPHOS 65 12/17/2011 0448   BILITOT 0.8 12/17/2011 0448     GFRNONAA 76* 12/17/2011 0448   GFRAA 89* 12/17/2011 0448   COAGS Lab Results  Component Value Date   INR 0.98 12/15/2011   INR 1.45 08/13/2009   INR 1.50* 08/12/2009   Lipid Panel    Component Value Date/Time   CHOL 184 12/16/2011 0430   TRIG 51 12/16/2011 0430   HDL 60 12/16/2011 0430   CHOLHDL 3.1 12/16/2011 0430   VLDL 10 12/16/2011 0430   LDLCALC 114* 12/16/2011 0430   HgbA1C  Lab Results  Component Value Date   HGBA1C 6.9* 12/16/2011   Urine Drug Screen  No results found for this basename: labopia,  cocainscrnur,  labbenz,  amphetmu,  thcu,  labbarb    Alcohol Level No results found for this basename: eth     Results for orders placed during the hospital encounter of 12/15/11 (from the past 24 hour(s))  PHENYTOIN LEVEL, TOTAL     Status: Abnormal   Collection Time   12/17/11 12:04 AM      Component Value Range   Phenytoin Lvl 7.0 (*) 10.0 - 20.0 ug/mL  COMPREHENSIVE METABOLIC PANEL     Status: Abnormal   Collection Time   12/17/11  4:48 AM      Component Value Range   Sodium 139  135 - 145 mEq/L   Potassium 3.5  3.5 - 5.1 mEq/L   Chloride 103  96 - 112 mEq/L   CO2 26  19 - 32 mEq/L   Glucose, Bld 141 (*) 70 - 99 mg/dL   BUN 7  6 - 23 mg/dL   Creatinine, Ser 1.61  0.50 - 1.35 mg/dL   Calcium 9.1  8.4 - 09.6 mg/dL   Total Protein 6.7  6.0 - 8.3 g/dL   Albumin 3.5  3.5 - 5.2 g/dL   AST 20  0 - 37 U/L   ALT 22  0 - 53 U/L   Alkaline Phosphatase 65  39 - 117 U/L   Total Bilirubin 0.8  0.3 - 1.2 mg/dL   GFR calc non Af Amer 76 (*) >90 mL/min   GFR calc Af Amer 89 (*) >90 mL/min  CBC WITH DIFFERENTIAL     Status: Abnormal   Collection Time   12/17/11  4:48 AM      Component Value Range   WBC 9.5  4.0 - 10.5 K/uL   RBC 5.25  4.22 - 5.81 MIL/uL   Hemoglobin 13.9  13.0 - 17.0 g/dL   HCT 04.5  40.9 - 81.1 %   MCV 81.7  78.0 - 100.0 fL   MCH 26.5  26.0 - 34.0 pg   MCHC 32.4  30.0 - 36.0 g/dL   RDW 91.4 (*) 78.2 - 95.6 %   Platelets 184  150 - 400 K/uL    Neutrophils Relative 77  43 - 77 %   Neutro Abs 7.3  1.7 - 7.7 K/uL   Lymphocytes Relative 10 (*) 12 - 46 %   Lymphs  Abs 0.9  0.7 - 4.0 K/uL   Monocytes Relative 12  3 - 12 %   Monocytes Absolute 1.1 (*) 0.1 - 1.0 K/uL   Eosinophils Relative 1  0 - 5 %   Eosinophils Absolute 0.1  0.0 - 0.7 K/uL   Basophils Relative 0  0 - 1 %   Basophils Absolute 0.0  0.0 - 0.1 K/uL  PHENYTOIN LEVEL, TOTAL     Status: Normal   Collection Time   12/17/11  4:48 AM      Component Value Range   Phenytoin Lvl 19.1  10.0 - 20.0 ug/mL    Ct Head Wo Contrast  12/16/2011  *RADIOLOGY REPORT*  Clinical Data: Unable to wake up.  Stroke.  CT HEAD WITHOUT CONTRAST  Technique:  Contiguous axial images were obtained from the base of the skull through the vertex without contrast.  Comparison: 12/15/2011.  Findings: Exam is markedly motion degraded.  Findings raise possibility of progressive right hemispheric infarct. Mild local mass effect.  Limited for detection of intracranial hemorrhage.  Left anterior left temporal lobe/ clinoid/cavernous sinus region hyperdense lesions suggestive of a meningioma.  This can be evaluated with contrast enhanced MR.  IMPRESSION: Exam is markedly motion degraded.  Findings raise possibility of progressive right hemispheric infarct.  Limited for detection of intracranial hemorrhage.  Left anterior left temporal lobe/ clinoid/cavernous sinus region hyperdense lesions suggestive of a meningioma.  This can be evaluated with contrast enhanced MR.  Critical Value/emergent results were called by telephone at the time of interpretation on 12/16/2011 at 7:20 a.m. to Tammy patients nurse. , who verbally acknowledged these results.   Original Report Authenticated By: Lacy Duverney, M.D.    Ct Head (brain) Wo Contrast  12/15/2011  *RADIOLOGY REPORT*  Clinical Data: Left-sided facial droop.  Diabetic.  CT HEAD WITHOUT CONTRAST  Technique:  Contiguous axial images were obtained from the base of the skull  through the vertex without contrast.  Comparison: None.  Findings: Right middle cerebral artery slightly dense.  There is minimal hypodensity right opercular region. This may reflect changes of early acute infarct.  Large hyperdense mass centered at the left clinoid with extension into the left middle cranial fossa, left cavernous sinus, left subfrontal region and orbital apex region having appearance suggestive of a meningioma with bony remodeling.  This can be assessed with contrast enhanced MR.   Hypodensity adjacent left frontal lobe may reflect vasogenic edema.  No intracranial hemorrhage.  No hydrocephalus.  Exophthalmos.  Prior right mastoidectomy.  Mild thickening along the peripheral aspect of the mastoidectomy site.  Partial opacification of residual right mastoid air cells.  IMPRESSION: Right middle cerebral artery slightly dense.  There is minimal hypodensity right opercular region. This may reflect changes of early acute infarct.  Large hyperdense mass centered at the left clinoid with extension into the left middle cranial fossa, left cavernous sinus, left subfrontal region and orbital apex region having appearance suggestive of a meningioma with bony remodeling.  This can be assessed with contrast enhanced MR.   Hypodensity adjacent left frontal lobe may reflect vasogenic edema.  No intracranial hemorrhage.  Prior right mastoidectomy.  Mild thickening along the peripheral aspect of the mastoidectomy site.  Partial opacification of residual right mastoid air cells.  This is a code stroke with results called to Dr. Roseanne Reno 12/15/2011 4:00 p.m.   Original Report Authenticated By: Lacy Duverney, M.D.    Mr Laqueta Jean ZO Contrast  12/16/2011  *RADIOLOGY REPORT*  Clinical Data:  Left-sided  facial droop.  MRI BRAIN WITH AND WITHOUT CONTRAST MRA HEAD WITHOUT CONTRAST  Technique: Multiplanar, multiecho pulse sequences of the brain and surrounding structures were obtained according to standard protocol with and  without intravenous contrast.  Angiographic images of the Circle of [place] were obtained using MRA technique without intravenous contrast.  Contrast: 20 ml MultiHance.  Comparison11/15/2013 CT.  MRI HEAD  Findings: Motion degraded exam.  Large acute non hemorrhagic right hemispheric infarct (right middle cerebral artery distribution) involving portions of the right frontal lobe, right temporal lobe, right opercular and sub insular region extending to the border with the right parietal lobe.  Questionable tiny infarct left temporal lobe.  Centered at the left sphenoid wing is a 5 x 4.2 x 4.1 cm mass which has an appearance most consistent with a meningioma invading the left cavernous sinus and pituitary region with extension posterior to the left clivus, into the left orbital apex, anterior left middle cranial fossa and surrounding the left internal carotid artery. Mild vasogenic edema along the superior border involving the anterior left frontal lobe.  Prior to any intervention for this particular lesion, follow-up imaging without motion may prove helpful.  Right mastoid air cell opacification.  No obstructing lesion noted in the region of posterior-superior nasopharynx causing right sided eustachian tube dysfunction.  Major dural sinuses are patent.  IMPRESSION: Large right middle cerebral artery distribution non hemorrhagic infarct as detailed above.  Large left sphenoid wing mass consistent with meningioma with invasion as detailed above.  MRA HEAD  Findings: Circumferential narrowing (caused by a meningioma) of the supraclinoid aspect of the left internal carotid artery, the carotid terminus, the A1 segment of the left anterior cerebral artery and slightly less so the M1 segment of the left middle cerebral artery (which is elevated by the mass).  Decreased caliber/number of visualized left middle cerebral artery branch vessels may indicate flow limiting stenosis by the meningioma.  Right internal carotid  artery/carotid terminus without significant narrowing.  No significant narrowing of the M1 segment or A1 segment of the right middle cerebral artery/anterior cerebral artery respectively.  Right vertebral artery is dominant.  Moderate to marked tandem stenosis left vertebral artery.  High-grade stenosis proximal basilar artery.  Nonvisualization left PICA and both AICAs.  Moderate to marked narrowing right PICA.  Irregularity and narrowing of portions of the superior cerebral artery and posterior cerebral artery distal branches.  Minimal bulbous appearance of the basilar tip without discrete aneurysm.  IMPRESSION:  Circumferential narrowing (caused by a meningioma) of the supraclinoid aspect of the left internal carotid artery, the carotid terminus, the A1 segment of the left anterior cerebral artery and slightly less so the M1 segment of the left middle cerebral artery (which is elevated by the mass).  Decreased caliber/number of visualized left middle cerebral artery branch vessels may indicate flow limiting stenosis by the meningioma.  Right internal carotid artery/carotid terminus without significant narrowing.  No significant narrowing of the M1 segment or A1 segment of the right middle cerebral artery/anterior cerebral artery respectively.  Right vertebral artery is dominant.  Moderate to marked tandem stenosis left vertebral artery.  High-grade stenosis proximal basilar artery.  Critical Value/emergent results were called by telephone at the time of interpretation on 12/16/2011 at 6:30 p.m. to Tammy patients nurse., who verbally acknowledged these results.   Original Report Authenticated By: Lacy Duverney, M.D.    Dg Chest Port 1 View  12/15/2011  *RADIOLOGY REPORT*  Clinical Data: Recent stroke  PORTABLE CHEST - 1 VIEW  Comparison: None.  Findings: Slight cardiac enlargement.  Clear lung fields.  No active infiltrates or significant effusions.  No pneumothorax. Negative osseous structures.  IMPRESSION:  Mild cardiac enlargement.  No active infiltrates or failure.   Original Report Authenticated By: Davonna Belling, M.D.    Mr Mra Head/brain Wo Cm  12/16/2011  *RADIOLOGY REPORT*  Clinical Data:  Left-sided facial droop.  MRI BRAIN WITH AND WITHOUT CONTRAST MRA HEAD WITHOUT CONTRAST  Technique: Multiplanar, multiecho pulse sequences of the brain and surrounding structures were obtained according to standard protocol with and without intravenous contrast.  Angiographic images of the Circle of [place] were obtained using MRA technique without intravenous contrast.  Contrast: 20 ml MultiHance.  Comparison11/15/2013 CT.  MRI HEAD  Findings: Motion degraded exam.  Large acute non hemorrhagic right hemispheric infarct (right middle cerebral artery distribution) involving portions of the right frontal lobe, right temporal lobe, right opercular and sub insular region extending to the border with the right parietal lobe.  Questionable tiny infarct left temporal lobe.  Centered at the left sphenoid wing is a 5 x 4.2 x 4.1 cm mass which has an appearance most consistent with a meningioma invading the left cavernous sinus and pituitary region with extension posterior to the left clivus, into the left orbital apex, anterior left middle cranial fossa and surrounding the left internal carotid artery. Mild vasogenic edema along the superior border involving the anterior left frontal lobe.  Prior to any intervention for this particular lesion, follow-up imaging without motion may prove helpful.  Right mastoid air cell opacification.  No obstructing lesion noted in the region of posterior-superior nasopharynx causing right sided eustachian tube dysfunction.  Major dural sinuses are patent.  IMPRESSION: Large right middle cerebral artery distribution non hemorrhagic infarct as detailed above.  Large left sphenoid wing mass consistent with meningioma with invasion as detailed above.  MRA HEAD  Findings: Circumferential narrowing (caused  by a meningioma) of the supraclinoid aspect of the left internal carotid artery, the carotid terminus, the A1 segment of the left anterior cerebral artery and slightly less so the M1 segment of the left middle cerebral artery (which is elevated by the mass).  Decreased caliber/number of visualized left middle cerebral artery branch vessels may indicate flow limiting stenosis by the meningioma.  Right internal carotid artery/carotid terminus without significant narrowing.  No significant narrowing of the M1 segment or A1 segment of the right middle cerebral artery/anterior cerebral artery respectively.  Right vertebral artery is dominant.  Moderate to marked tandem stenosis left vertebral artery.  High-grade stenosis proximal basilar artery.  Nonvisualization left PICA and both AICAs.  Moderate to marked narrowing right PICA.  Irregularity and narrowing of portions of the superior cerebral artery and posterior cerebral artery distal branches.  Minimal bulbous appearance of the basilar tip without discrete aneurysm.  IMPRESSION:  Circumferential narrowing (caused by a meningioma) of the supraclinoid aspect of the left internal carotid artery, the carotid terminus, the A1 segment of the left anterior cerebral artery and slightly less so the M1 segment of the left middle cerebral artery (which is elevated by the mass).  Decreased caliber/number of visualized left middle cerebral artery branch vessels may indicate flow limiting stenosis by the meningioma.  Right internal carotid artery/carotid terminus without significant narrowing.  No significant narrowing of the M1 segment or A1 segment of the right middle cerebral artery/anterior cerebral artery respectively.  Right vertebral artery is dominant.  Moderate to marked tandem stenosis left vertebral artery.  High-grade stenosis proximal  basilar artery.  Critical Value/emergent results were called by telephone at the time of interpretation on 12/16/2011 at 6:30 p.m. to  Tammy patients nurse., who verbally acknowledged these results.   Original Report Authenticated By: Lacy Duverney, M.D.     CT of the brain   IMPRESSION:  Right middle cerebral artery slightly dense. There is minimal  hypodensity right opercular region. This may reflect changes of  early acute infarct.  Large hyperdense mass centered at the left clinoid with extension  into the left middle cranial fossa, left cavernous sinus, left  subfrontal region and orbital apex region having appearance  suggestive of a meningioma with bony remodeling. This can be  assessed with contrast enhanced MR. Hypodensity adjacent left  frontal lobe may reflect vasogenic edema.  No intracranial hemorrhage.  Prior right mastoidectomy. Mild thickening along the peripheral  aspect of the mastoidectomy site. Partial opacification of  residual right mastoid air cells.   CT angio  Not ordered  MRI of the brain    IMPRESSION:  Large right middle cerebral artery distribution non hemorrhagic  infarct as detailed above.  Large left sphenoid wing mass consistent with meningioma with  invasion as detailed above.  MRA of the brain   IMPRESSION:  Circumferential narrowing (caused by a meningioma) of the  supraclinoid aspect of the left internal carotid artery, the  carotid terminus, the A1 segment of the left anterior cerebral  artery and slightly less so the M1 segment of the left middle  cerebral artery (which is elevated by the mass).  Decreased caliber/number of visualized left middle cerebral artery  branch vessels may indicate flow limiting stenosis by the  meningioma.  Right internal carotid artery/carotid terminus without significant  narrowing. No significant narrowing of the M1 segment or A1  segment of the right middle cerebral artery/anterior cerebral  artery respectively.  Right vertebral artery is dominant. Moderate to marked tandem  stenosis left vertebral artery.  High-grade stenosis proximal  basilar artery.   2D Echocardiogram   Study Conclusions  - Left ventricle: The cavity size was normal. Systolic function was normal. The estimated ejection fraction was in the range of 55% to 60%. Wall motion was normal; there were no regional wall motion abnormalities. - Mitral valve: Mild regurgitation. - Left atrium: The atrium was moderately dilated. - Right ventricle: The cavity size was dilated. Wall thickness was normal. - Right atrium: The atrium was moderately dilated. - Pulmonary arteries: Systolic pressure was moderately increased. PA peak pressure: 52mm Hg (S). Impressions:  - No cardiac source of emboli was indentified. however the patient was in atrial fibrillation on the ECHO EKG that is a potential source. No LV thrombus noted.    Carotid Doppler  Not ordered Cerebral angio done.  CXR   IMPRESSION:  Mild cardiac enlargement. No active infiltrates or failure.   EKG   ATRIAL FIBRILLATION, V-RATE 68- 92 ~ var'd rate, irreg atrial activity LATERAL INFARCT, OLD ~ Q>72mS, abnormal ST-T, I aVL V5-6 ABNRM R PROG, CONSIDER ASMI OR LEAD PLACEMENT ~ Q >72mS, diminished R, V2  Physical Exam    Patient is sleepy, but he is cooperative at the time of examination.  Frequent twitching of the left face is noted.  Speech is dysarthric, not aphasic.  Respiratory examination is clear.  Cardiovascular examination reveals an occasional irregular heart rhythm, no obvious murmurs or rubs are noted.  No carotid bruits are noted.  Abdomen is obese, soft and nontender, positive bowel sounds.  Extremities are without significant  edema  The patient has clear left facial droop, some conjugate eye deviation to the right. The patient has a dense left homonymous visual field deficit.  The patient has a left hemiparesis, left arm greater than left leg. Right side appears to have normal strength.  No obvious ataxia seen with the extremities.  The patient could not be  ambulated.  Deep tendon reflexes are symmetric.    ASSESSMENT Brendan Scott is a 66 y.o. male with a right brain stroke, secondary to cardioembolic event. On aspirin 81 mg orally every day for secondary stroke prevention.  Stroke risk factors:  atrial fibrillation and diabetes mellitus The patient has a left brain meningioma by CT, MRI is pending. The patient is in atrial fibrillation, and this is the likely source of the right brain stroke. The meningioma is likely an ancillary finding. The patient appears to have focal status epilepticus. The patient will require more aggressive antiepileptic therapy at this time. The patient may require anticoagulation in the future. The patient is currently on a oral diet. The focal seizures could potentially have some impact on the ability to swallow. Cerebral angiogram did not show large vessel blockages. The patient is up out of bed.Sleepy following ativan and repeat dilantin dosing. Seizure last PM with left are jerking and decreased responsiveness. MRA shows left MCA disease and proximal basilar disease. EEG did not show definite epileptiform discharges.   Hospital day # 2  TREATMENT/PLAN  -Watch in ICU another day -ST, PT, OT eval -aspirin, lovenox -NSGY eval for left frontal and anterior temporal meningioma -Dilantin levels in AM, seizure precautions  Lesly Dukes

## 2011-12-17 NOTE — Evaluation (Signed)
Occupational Therapy Evaluation Patient Details Name: Brendan Scott MRN: [id number] DOB: 01-25-1946 Today's Date: 12/17/2011 Time: 4098-1191 OT Time Calculation (min): 14 min  OT Assessment / Plan / Recommendation Clinical Impression  Pt admitted with left side weakness. MRI positive for R MCA CVA. Eval limited due to decreased arousal/alertness during session (has been more alert with RN previously this morning). Will recommend CIR at this time to further progress rehab before return home.      OT Assessment  Patient needs continued OT Services    Follow Up Recommendations  CIR;Supervision/Assistance - 24 hour    Barriers to Discharge      Equipment Recommendations  3 in 1 bedside comode    Recommendations for Other Services Rehab consult  Frequency  Min 3X/week    Precautions / Restrictions Precautions Precautions: Fall Restrictions Weight Bearing Restrictions: No   Pertinent Vitals/Pain RN Tammy aware of increased facial twitch.    ADL  Grooming: Performed;Wash/dry face;+1 Total assistance Where Assessed - Grooming: Supine, head of bed up Upper Body Bathing: +1 Total assistance;Simulated Where Assessed - Upper Body Bathing: Supine, head of bed up Lower Body Bathing: Simulated;+1 Total assistance Where Assessed - Lower Body Bathing: Supine, head of bed up Upper Body Dressing: Simulated;+1 Total assistance Where Assessed - Upper Body Dressing: Supine, head of bed up Lower Body Dressing: Simulated;+1 Total assistance Where Assessed - Lower Body Dressing: Supine, head of bed up Equipment Used:  (none) Transfers/Ambulation Related to ADLs: not assessed ADL Comments: Pt with left facial twitch supine in bed and requiring max stimulation (cold washcloth and sternal rub) to open eyes.. (Pt up OOB with RN earlier this morning).  Attempted sitting EOB but pt resistant.    OT Diagnosis: Generalized weakness;Paresis;Cognitive deficits  OT Problem List: Decreased activity  tolerance;Impaired balance (sitting and/or standing);Decreased cognition;Decreased strength;Decreased knowledge of use of DME or AE;Impaired UE functional use OT Treatment Interventions: Self-care/ADL training;Neuromuscular education;DME and/or AE instruction;Therapeutic activities;Cognitive remediation/compensation;Patient/family education;Balance training   OT Goals Acute Rehab OT Goals OT Goal Formulation: Patient unable to participate in goal setting Time For Goal Achievement: 12/31/11 Potential to Achieve Goals: Good ADL Goals Pt Will Perform Grooming: with min assist;Supported;Sitting, edge of bed;Sitting at sink ADL Goal: Grooming - Progress: Goal set today Pt Will Perform Upper Body Bathing: with min assist;Sitting, chair;Sitting, edge of bed;Supported ADL Goal: Upper Body Bathing - Progress: Goal set today Pt Will Perform Lower Body Bathing: with min assist;Sitting, chair;Sitting, edge of bed;Supported ADL Goal: Lower Body Bathing - Progress: Goal set today Pt Will Transfer to Toilet: with mod assist;Ambulation;with DME;Comfort height toilet ADL Goal: Toilet Transfer - Progress: Goal set today Miscellaneous OT Goals Miscellaneous OT Goal #1: Pt will perform bed mobility with mod assist in prep for EOB ADLs. OT Goal: Miscellaneous Goal #1 - Progress: Goal set today Miscellaneous OT Goal #2: Pt will tolerate >15 of therapy in order to increase activity tolerance as precursor for ADLs. OT Goal: Miscellaneous Goal #2 - Progress: Goal set today Miscellaneous OT Goal #3: Pt will set EOB with min assist >5 min as precursor for ADLs. OT Goal: Miscellaneous Goal #3 - Progress: Goal set today  Visit Information  Last OT Received On: 12/17/11 Assistance Needed: +2 PT/OT Co-Evaluation/Treatment: Yes    Subjective Data      Prior Functioning     Home Living Lives With: Spouse Available Help at Discharge: Family;Available 24 hours/day Type of Home: Mobile home Home Access: Stairs  to enter Entrance Stairs-Number of Steps: 4 Entrance  Stairs-Rails: Right;Left;Can reach both Home Layout: One level Bathroom Shower/Tub: Walk-in shower;Door [employer] Toilet: Standard Bathroom Accessibility: Yes How Accessible: Accessible via walker Home Adaptive Equipment: Walker - rolling;Shower chair without back Prior Function Level of Independence: Independent Able to Take Stairs?: Yes Driving: Yes Vocation: Full time employment Communication Communication: Expressive difficulties Dominant Hand: Right         Vision/Perception Vision - Assessment Vision Assessment: Vision not tested Additional Comments: Unable to test due to pt unable.   Cognition  Overall Cognitive Status: Difficult to assess Area of Impairment: Safety/judgement Difficult to assess due to: Level of arousal Arousal/Alertness: Awake/alert Orientation Level: Appears intact for tasks assessed Behavior During Session: [facility] for tasks performed Safety/Judgement: Decreased awareness of need for assistance;Decreased safety judgement for tasks assessed    Extremity/Trunk Assessment Right Upper Extremity Assessment RUE ROM/Strength/Tone: Unable to fully assess;Due to impaired cognition (decreased alertness) Left Upper Extremity Assessment LUE ROM/Strength/Tone: Unable to fully assess;Due to impaired cognition (decreased alertness)     Mobility Bed Mobility Bed Mobility: Supine to Sit;Scooting to HOB Supine to Sit: 1: +2 Total assist Supine to Sit: Patient Percentage: 10% Scooting to HOB: 1: +2 Total assist Scooting to [facility]: Patient Percentage: 0% Details for Bed Mobility Assistance: Pt resisting transfer, unable to complete full sit Transfers Transfers: Not assessed     Shoulder Instructions     Exercise     Balance     End of Session OT - End of Session Activity Tolerance: Other (comment) (limited by decreased alertness) Patient left: in bed;with call bell/phone within reach;with nursing in  room Nurse Communication: Other (comment) (increased facial twitch)  GO    12/17/2011 Cipriano Mile OTR/L Pager [phone] Office [phone]  Cipriano Mile 12/17/2011, 1:11 PM

## 2011-12-17 NOTE — Progress Notes (Signed)
Was called to bedside due to seizure activity. Patient's wife awoke due to abnormal noises and called nurse who witnessed seizure activity. On my arrival, the patient continued to have clonic activity of the left arm and face. This aborted with 2mg  ativan.   Earlier [employer], he had an episode of tongue twitching and a dilantin level was ordered at that time. It was drawn around midnight and is 7, therefore I will load with an extra 10PE/kg of fosphenytoin to get this to a therapeutic level. He had received a total of 700mg  earlier. 300mg  per day may still be an adequate dose, but would monitor levels while patient is continuing to have partial seizures.   Though the EEG read from earlier did not show epileptiform activity, the event tonight was clearly epileptic.   1) 2mg  IV ativan already given with cessation of seizure activity 2) Load with 10 PE/KG fosphenytoin.  3) Continue phenytoin 100mg  TID 4) recheck level in AM, goal level near 20.   Ritta Slot, MD [facility] [phone] if 7am - 7pm  If 7pm- 7am, please page neurology on call at [phone].

## 2011-12-17 NOTE — Progress Notes (Signed)
Speech Language Pathology Dysphagia Treatment Patient Details Name: Brendan Scott MRN: [id number] DOB: 27-Sep-1945 Today's Date: 12/17/2011 Time: 1700-1716 SLP Time Calculation (min): 16 min  Assessment / Plan / Recommendation Clinical Impression  Patient seen for dysphagia treatment for diet tolerance of dysphagia 1 and nectar thick liquids following initial BSE completed on 12/16/11.  Per RN patient now with seizure activity with stimulus.  Patient administered  puree consistency x1.  Difficulty noted to initiate  anterior to posterior transit with anterior spillage on right.  Noted facial twitching on left increasing with increased LOA.  Minimal amount of Puree consistency suctioned out. Slight wet vocal quality s/p puree trial but patient able to clear throat with max verbal cues  Recommend NPO status due to fluctuating  LOA increasing risk for aspiration with exception of medication crushed administered in Puree.  ST to reassess swallow bedside to possibly resume  PO's on 12/18/11.     Diet Recommendation  Initiate / Change Diet: NPO          Swallowing Goals  SLP Swallowing Goals Swallow Study Goal #1 - Progress: Not Met Swallow Study Goal #2 - Progress: Not met  General Temperature Spikes Noted: No Respiratory Status: Room air Behavior/Cognition: Lethargic;Distractible;Requires cueing;Decreased sustained attention Patient Positioning: Upright in bed  Oral Cavity - Oral Hygiene Does patient have any of the following "at risk" factors?: Diet - patient on thickened liquids;Other - dysphagia Patient is HIGH RISK - Oral Care Protocol followed (see row info): Yes Patient is AT RISK - Oral Care Protocol followed (see row info): Yes   Dysphagia Treatment Treatment focused on: Skilled observation of diet tolerance;Facilitation of oral preparatory phase;Facilitation of oral phase;Facilitation of pharyngeal phase Treatment Methods/Modalities: Skilled observation Patient observed directly  with PO's: Yes Type of PO's observed: Dysphagia 1 (puree) Feeding: Total assist Oral Phase Signs & Symptoms: Prolonged bolus formation;Prolonged oral phase Pharyngeal Phase Signs & Symptoms: Suspected delayed swallow initiation;Wet vocal quality Type of cueing: Verbal;Tactile Amount of cueing: Maximal   GO    Moreen Fowler MS, CCC-SLP [phone] [facility] 12/17/2011, 5:39 PM

## 2011-12-17 NOTE — Procedures (Signed)
EEG NUMBER:  [id number].  REFERRING PHYSICIAN:  Dr. Ritta Slot.  INDICATION FOR STUDY:  A 66 year old man who was admitted for acute right MCA territory stroke who also has left frontal meningioma.  The patient has been having episodes of gaze preference to the right as well as lower facial twitching.  Study is being performed to rule out possible new onset focal seizure disorder.  DESCRIPTION:  This is a routine EEG recording performed during wakefulness.  Predominant background activity consisted of 10 Hz symmetrical alpha rhythm, which attenuated well with eye opening. Photic stimulation produced a symmetrical occipital driving response. Hyperventilation was not performed.  There were frequent occurrences of rhythmic type muscle artifact recorded primarily from the temporal regions with frequency of approximately 5 Hz and lasting up to 3 seconds.  There were no changes in brain activity during the episodes of apparent muscle twitching.  There were no areas of abnormal slowing.  INTERPRETATION:  This is a normal EEG recording during wakefulness.  No evidence of an epileptic disorder was demonstrated.  Rhythmic muscle activity consistent with tremor was seen frequently throughout the record, as described above.     Noel Christmas, MD    JX:BJYN D:  12/16/2011 15:47:15  T:  12/17/2011 01:04:09  Job #:  [id number]

## 2011-12-17 NOTE — Progress Notes (Signed)
Physical Therapy Treatment Patient Details Name: Brendan Scott MRN: [id number] DOB: 1945/06/07 Today's Date: 12/17/2011 Time: 6295-2841 PT Time Calculation (min): 9 min  PT Assessment / Plan / Recommendation Comments on Treatment Session  Upon entering room, pt with difficulty opening eyes and with noticeable L focal tick and gurgling at the mouth, RN aware and stated that PT is okay. Attempted bed mobiltiy with pt, pt resistant and nonresponsive therefore did not sit up completely. Will continue per plan tomorrow pending pt medical stability    Follow Up Recommendations  CIR;Supervision/Assistance - 24 hour     Does the patient have the potential to tolerate intense rehabilitation     Barriers to Discharge        Equipment Recommendations  Rolling walker with 5" wheels    Recommendations for Other Services Rehab consult  Frequency Min 4X/week   Plan Frequency remains appropriate;Discharge plan needs to be updated    Precautions / Restrictions Precautions Precautions: Fall Restrictions Weight Bearing Restrictions: No   Pertinent Vitals/Pain Pt unarousable during session.     Mobility  Bed Mobility Bed Mobility: Supine to Sit;Scooting to HOB Supine to Sit: 1: +2 Total assist Supine to Sit: Patient Percentage: 10% Scooting to HOB: 1: +2 Total assist Scooting to [facility]: Patient Percentage: 0% Details for Bed Mobility Assistance: Pt resisting transfer, unable to complete full sit      PT Goals Acute Rehab PT Goals PT Goal: Rolling Supine to Right Side - Progress: Not progressing PT Goal: Rolling Supine to Left Side - Progress: Not progressing  Visit Information  Last PT Received On: 12/17/11 Assistance Needed: +2    Subjective Data      Cognition  Overall Cognitive Status: Impaired Area of Impairment: Safety/judgement Arousal/Alertness: Awake/alert Orientation Level: Appears intact for tasks assessed Behavior During Session: [facility] for tasks  performed Safety/Judgement: Decreased awareness of need for assistance;Decreased safety judgement for tasks assessed    Balance     End of Session PT - End of Session Activity Tolerance: Treatment limited secondary to medical complications (Comment) Patient left: in bed;with call bell/phone within reach;with nursing in room Nurse Communication: Mobility status   GP     Milana Kidney 12/17/2011, 12:48 PM

## 2011-12-18 DIAGNOSIS — I6330 Cerebral infarction due to thrombosis of unspecified cerebral artery: Secondary | ICD-10-CM

## 2011-12-18 LAB — BASIC METABOLIC PANEL
BUN: 10 mg/dL (ref 6–23)
Calcium: 9.2 mg/dL (ref 8.4–10.5)
Chloride: 103 mEq/L (ref 96–112)
Creatinine, Ser: 1.08 mg/dL (ref 0.50–1.35)
GFR calc Af Amer: 81 mL/min — ABNORMAL LOW (ref >90)

## 2011-12-18 LAB — CBC
HCT: 42.6 % (ref 39.0–52.0)
MCH: 27.3 pg (ref 26.0–34.0)
MCV: 80.7 fL (ref 78.0–100.0)
RDW: 15.3 % (ref 11.5–15.5)
WBC: 10.7 10*3/uL — ABNORMAL HIGH (ref 4.0–10.5)

## 2011-12-18 MED ORDER — SODIUM CHLORIDE 0.9 % IV SOLN
200.0000 mg | Freq: Two times a day (BID) | INTRAVENOUS | Status: DC
  Administered 2011-12-18 – 2011-12-19 (×4): 200 mg via INTRAVENOUS
  Filled 2011-12-18 (×9): qty 20

## 2011-12-18 MED ORDER — SODIUM CHLORIDE 0.9 % IV SOLN
1500.0000 mg | Freq: Two times a day (BID) | INTRAVENOUS | Status: DC
  Administered 2011-12-18 – 2011-12-21 (×6): 1500 mg via INTRAVENOUS
  Filled 2011-12-18 (×8): qty 15

## 2011-12-18 MED ORDER — SODIUM CHLORIDE 0.9 % IV SOLN
100.0000 mg | Freq: Two times a day (BID) | INTRAVENOUS | Status: DC
  Filled 2011-12-18 (×3): qty 10

## 2011-12-18 MED FILL — Midazolam HCl Inj 2 MG/2ML (Base Equivalent): INTRAMUSCULAR | Qty: 2 | Status: AC

## 2011-12-18 MED FILL — Fentanyl Citrate Inj 0.05 MG/ML: INTRAMUSCULAR | Qty: 2 | Status: AC

## 2011-12-18 NOTE — Progress Notes (Signed)
Rehab Admissions Coordinator Note:  Patient was screened by Trish Mage for appropriateness for an Inpatient Acute Rehab Consult.  At this time, we are recommending Inpatient Rehab consult.  Trish Mage 12/18/2011, 8:35 AM  I can be reached at [phone].

## 2011-12-18 NOTE — Progress Notes (Signed)
Physical Therapy Treatment Patient Details Name: Brendan Scott MRN: [id number] DOB: 1945/10/26 Today's Date: 12/18/2011 Time: 6213-0865 PT Time Calculation (min): 12 min  PT Assessment / Plan / Recommendation Comments on Treatment Session  RN requested PT/OT to assist pt back to bed per pt slide down in recliner and RN needed (A).  When entering room pt was slide down in the recliner and needs assist to sit upright.  Pt continues to be very lethargic throughout session however able to assist with transfers.    Follow Up Recommendations  CIR;Supervision/Assistance - 24 hour     Does the patient have the potential to tolerate intense rehabilitation     Barriers to Discharge        Equipment Recommendations  3 in 1 bedside comode    Recommendations for Other Services Rehab consult  Frequency Min 4X/week   Plan Frequency remains appropriate;Discharge plan needs to be updated    Precautions / Restrictions Precautions Precautions: Fall   Pertinent Vitals/Pain No c/o pain    Mobility  Bed Mobility Bed Mobility: Sit to Supine Rolling Right: 1: +2 Total assist Rolling Right: Patient Percentage: 40% Rolling Left: 3: Mod assist Left Sidelying to Sit: 1: +2 Total assist Left Sidelying to Sit: Patient Percentage: 50% Sitting - Scoot to Edge of Bed: 1: +1 Total assist Sit to Supine: 1: +2 Total assist;HOB flat Sit to Supine: Patient Percentage: 30% Details for Bed Mobility Assistance: +2 (A) to slowly descend shoulders to bed and elevate LE into bed.  Pt able to activate and initiate LE into bed.  Max cues for technique.  Pt continues to keep eyes closed throughout session. Transfers Transfers: Sit to Stand;Stand to Sit;Stand Pivot Transfers Sit to Stand: 1: +2 Total assist Sit to Stand: Patient Percentage: 60% Stand to Sit: 1: +2 Total assist Stand to Sit: Patient Percentage: 50% Stand Pivot Transfers: 1: +2 Total assist Stand Pivot Transfers: Patient Percentage: 50% Details for  Transfer Assistance: +2 (A) to initiate transfer and slowly descend to recliner with max cues for hand placement.  Pt needed manual cues to advance LE during transfer to recliner.   Ambulation/Gait Ambulation/Gait Assistance: Not tested (comment) Modified Rankin (Stroke Patients Only) Pre-Morbid Rankin Score: No symptoms Modified Rankin: Severe disability    Exercises     PT Diagnosis:    PT Problem List:   PT Treatment Interventions:     PT Goals Acute Rehab PT Goals PT Goal Formulation: With patient Time For Goal Achievement: 12/30/11 Potential to Achieve Goals: Fair Pt will Roll Supine to Right Side: with modified independence PT Goal: Rolling Supine to Right Side - Progress: Not met Pt will Roll Supine to Left Side: with modified independence PT Goal: Rolling Supine to Left Side - Progress: Not met Pt will go Supine/Side to Sit: with supervision PT Goal: Supine/Side to Sit - Progress: Progressing toward goal Pt will go Sit to Supine/Side: with supervision PT Goal: Sit to Supine/Side - Progress: Progressing toward goal Pt will go Sit to Stand: with min assist PT Goal: Sit to Stand - Progress: Progressing toward goal Pt will go Stand to Sit: with min assist PT Goal: Stand to Sit - Progress: Progressing toward goal Pt will Transfer Bed to Chair/Chair to Bed: with mod assist PT Transfer Goal: Bed to Chair/Chair to Bed - Progress: Progressing toward goal  Visit Information  Last PT Received On: 12/18/11 Assistance Needed: +2    Subjective Data  Subjective:  (Pt very fatigue with little verbalization)  Cognition  Overall Cognitive Status: Difficult to assess Difficult to assess due to: Level of arousal Arousal/Alertness: Lethargic Orientation Level: Appears intact for tasks assessed (per RN pt able to recall place, time and situation) Behavior During Session: Lethargic Safety/Judgement: Decreased awareness of need for assistance Cognition - Other Comments: Pt with poor  arousal, but will respond inconsistently to one step commands (~75%).     Balance  Balance Balance Assessed: Yes Static Sitting Balance Static Sitting - Balance Support: Feet supported;Bilateral upper extremity supported Static Sitting - Level of Assistance: 5: Stand by assistance Static Sitting - Comment/# of Minutes: minguard (A) for safety with for proper midline Static Standing Balance Static Standing - Balance Support: Bilateral upper extremity supported;During functional activity Static Standing - Level of Assistance: 1: +2 Total assist Static Standing - Comment/# of Minutes: +2 (A) to maintain balance and promoted midline and upright posture.  End of Session PT - End of Session Equipment Utilized During Treatment: Gait belt Activity Tolerance:  (treatment limited due to overall fatigue) Patient left: in bed;with call bell/phone within reach;with nursing in room Nurse Communication: Mobility status   GP     Haralambos Yeatts 12/18/2011, 1:43 PM  Jake Shark, PT DPT [phone]

## 2011-12-18 NOTE — Progress Notes (Signed)
Occupational Therapy Treatment Patient Details Name: Brendan Scott MRN: [id number] DOB: March 06, 1945 Today's Date: 12/18/2011 Time: 5621-3086 OT Time Calculation (min): 36 min  OT Assessment / Plan / Recommendation Comments on Treatment Session Pt progressing with therapy- able to transfer to chair today with +2 assist. Pt with questionable left eye esophoria? but unable to complete assessment as pt unable to maintain eyes open. Will continue to follow acutely and recommend CIR for d/c.    Follow Up Recommendations  CIR;Supervision/Assistance - 24 hour    Barriers to Discharge       Equipment Recommendations  3 in 1 bedside comode    Recommendations for Other Services Rehab consult  Frequency     Plan      Precautions / Restrictions Precautions Precautions: Fall   Pertinent Vitals/Pain Pt with no c/o pain    ADL  Grooming: Wash/dry face;Minimal assistance Where Assessed - Grooming: Unsupported sitting Toilet Transfer: +2 Total assistance Toilet Transfer: Patient Percentage: 60% Toilet Transfer Method: Stand pivot Toilet Transfer Equipment:  (bed to chair) Toileting - Clothing Manipulation and Hygiene: +1 Total assistance Where Assessed - Toileting Clothing Manipulation and Hygiene: Standing Equipment Used: Gait belt Transfers/Ambulation Related to ADLs: +2total A(pt=60%) for sit to stand and stand pivot from bed to chair ADL Comments: Pt lethargic/decreased arousal but would follow one step commands inconsistently, even with eyes closed. Unable to assess vision as pt unable to maintain eyes open- appears to possibly have left eye esophoria?? Pt with strong left head tilt- requires total assist to bring to and maintain head in midline. Pt tends to keep left eye closed.     OT Diagnosis:    OT Problem List:   OT Treatment Interventions:     OT Goals ADL Goals Pt Will Perform Grooming: with min assist;Supported;Sitting, edge of bed;Sitting at sink ADL Goal: Grooming -  Progress: Progressing toward goals Pt Will Perform Upper Body Bathing: with min assist;Sitting, chair;Sitting, edge of bed;Supported Pt Will Perform Lower Body Bathing: with min assist;Sitting, chair;Sitting, edge of bed;Supported Pt Will Transfer to Toilet: with mod assist;Ambulation;with DME;Comfort height toilet Miscellaneous OT Goals Miscellaneous OT Goal #1: Pt will perform bed mobility with mod assist in prep for EOB ADLs. OT Goal: Miscellaneous Goal #2 - Progress: Progressing toward goals OT Goal: Miscellaneous Goal #3 - Progress: Progressing toward goals  Visit Information  Last OT Received On: 12/18/11 Assistance Needed: +2 PT/OT Co-Evaluation/Treatment: Yes    Subjective Data      Prior Functioning       Cognition  Difficult to assess due to: Level of arousal Arousal/Alertness: Lethargic Orientation Level: Appears intact for tasks assessed Behavior During Session:  (difficult to arouse) Safety/Judgement: Decreased awareness of need for assistance Cognition - Other Comments: Pt with poor arousal, but will respond inconsistently to one step commands (~75%).     Mobility  Shoulder Instructions Bed Mobility Bed Mobility: Left Sidelying to Sit Rolling Left: 3: Mod assist Left Sidelying to Sit: 1: +2 Total assist Left Sidelying to Sit: Patient Percentage: 50% Sitting - Scoot to Edge of Bed: 1: +1 Total assist Details for Bed Mobility Assistance: Requires step-by-step cues and physical assist for sequencing. Assist requires assist to shift weight from left to right during side to sit Transfers Sit to Stand: 1: +2 Total assist Sit to Stand: Patient Percentage: 60% Stand to Sit: 1: +2 Total assist Stand to Sit: Patient Percentage: 50%       Exercises      Balance  End of Session OT - End of Session Equipment Utilized During Treatment: Gait belt Activity Tolerance:  (limited by arousal) Patient left: in chair;with call bell/phone within reach Nurse  Communication: Mobility status  GO     Latina Frank 12/18/2011, 10:24 AM

## 2011-12-18 NOTE — Progress Notes (Signed)
SLP Cancellation Note  Patient Details Name: GOMER FRANCE MRN: [id number] DOB: 1945-08-02   Cancelled treatment:       Reason Eval/Treat Not Completed: Fatigue/lethargy limiting ability to participate. Per RN, patient lethargic this pm. Will f/u in am for further evaluation of swallow.  Ferdinand Lango MA, CCC-SLP [phone]    Evadean Sproule [name] 12/18/2011, 2:09 PM

## 2011-12-18 NOTE — Progress Notes (Addendum)
Physical Therapy Progress Note   12/18/11 1025  PT Visit Information  Last PT Received On 12/18/11  Assistance Needed +2  PT Time Calculation  PT Start Time 0930  PT Stop Time 1003  PT Time Calculation (min) 33 min  Subjective Data  Subjective (Pt very fatigue with little verbalization)  Precautions  Precautions Fall  Cognition  Overall Cognitive Status Difficult to assess  Difficult to assess due to Level of arousal  Arousal/Alertness Lethargic  Orientation Level Appears intact for tasks assessed (per RN pt able to recall place, time and situation)  Behavior During Session (difficult to arouse)  Safety/Judgement Decreased awareness of need for assistance  Cognition - Other Comments Pt with poor arousal, but will respond inconsistently to one step commands (~75%).   Bed Mobility  Bed Mobility Left Sidelying to Sit  Rolling Left 3: Mod assist  Left Sidelying to Sit 1: +2 Total assist  Left Sidelying to Sit: Patient Percentage 50%  Sitting - Scoot to Edge of Bed 1: +1 Total assist  Details for Bed Mobility Assistance Requires step-by-step cues and physical assist for sequencing. Assist requires assist to shift weight from left to right during side to sit  Transfers  Transfers Sit to Stand;Stand to Sit;Stand Pivot Transfers  Sit to Stand 1: +2 Total assist  Sit to Stand: Patient Percentage 60%  Stand to Sit 1: +2 Total assist  Stand to Sit: Patient Percentage 50%  Stand Pivot Transfers 1: +2 Total assist  Stand Pivot Transfers: Patient Percentage 60%  Details for Transfer Assistance +2 (A) to initiate transfer and slowly descend to recliner with max cues for hand placement.  Pt needed manual cues to advance LE during transfer to recliner.    Ambulation/Gait  Ambulation/Gait Assistance Not tested (comment)  Modified Rankin (Stroke Patients Only)  Pre-Morbid Rankin Score 0  Modified Rankin 5  Balance  Balance Assessed Yes  Static Sitting Balance  Static Sitting - Balance  Support Feet supported;Bilateral upper extremity supported  Static Sitting - Level of Assistance 5: Stand by assistance  Static Sitting - Comment/# of Minutes minguard (A) for safety with for proper midline  Static Standing Balance  Static Standing - Balance Support Bilateral upper extremity supported;During functional activity  Static Standing - Level of Assistance 1: +2 Total assist  Static Standing - Comment/# of Minutes +2 (A) to maintain balance and promoted midline and upright posture.  PT - End of Session  Equipment Utilized During Treatment Gait belt  Activity Tolerance (limited due to overall pt fagitue and difficulty to arouse)  Patient left in chair;with call bell/phone within reach  Nurse Communication Mobility status  PT - Assessment/Plan  Comments on Treatment Session Pt very lethargic throughout session however more alert sitting EOB and able to use wash cloth to clean face.  Pt able to sit EOB with minguard assist and able transfer to recliner.  Pt continues to keep downward gaze to right side and unable to pass midline from right to left.  However pt able to turn head to left side and recognizing left side.  Apparent visual deficits however difficult to assess due to lethargy.  Continue to recommend CIR at this time.  PT Plan Frequency remains appropriate;Discharge plan needs to be updated  PT Frequency Min 4X/week  Recommendations for Other Services Rehab consult  Follow Up Recommendations CIR;Supervision/Assistance - 24 hour  Equipment Recommended 3 in 1 bedside comode  Acute Rehab PT Goals  PT Goal Formulation With patient  Time For Goal  Achievement 12/30/11  Potential to Achieve Goals Fair  Pt will Roll Supine to Right Side with modified independence  PT Goal: Rolling Supine to Right Side - Progress Progressing toward goal  Pt will Roll Supine to Left Side with modified independence  PT Goal: Rolling Supine to Left Side - Progress Progressing toward goal  Pt will go  Supine/Side to Sit with supervision  PT Goal: Supine/Side to Sit - Progress Progressing toward goal  Pt will go Sit to Supine/Side with supervision  PT Goal: Sit to Supine/Side - Progress Progressing toward goal  Pt will go Sit to Stand with min assist  PT Goal: Sit to Stand - Progress Progressing toward goal  Pt will go Stand to Sit with min assist  PT Goal: Stand to Sit - Progress Progressing toward goal  Pt will Transfer Bed to Chair/Chair to Bed with mod assist  PT Transfer Goal: Bed to Chair/Chair to Bed - Progress Progressing toward goal  PT General Charges  $$ ACUTE PT VISIT 1 Procedure  PT Treatments  $Therapeutic Activity 8-22 mins  $Neuromuscular Re-education 8-22 mins    [place], [place] DPT [phone]

## 2011-12-18 NOTE — Progress Notes (Signed)
OT Note:  12/18/11 1300  OT Visit Information  Last OT Received On 12/18/11  Assistance Needed +2  OT Time Calculation  OT Start Time 1039  OT Stop Time 1111  OT Time Calculation (min) 32 min  Precautions  Precautions Fall  ADL  Toilet Transfer +2 Total assistance  Toilet Transfer: Patient Percentage 60%  Toilet Transfer Method Stand pivot  Toilet Transfer Equipment (chair to bed)  Equipment Used Gait belt  Transfers/Ambulation Related to ADLs +2total A(pt=60%) for sit to stand and stand pivot from bed to chair. VC and assist weight shift and advance bilateral feet.   ADL Comments Pt continues with lethargy/decreased arousal. Transferred back to bed as pt continues to slip down in the chair and is unsafe to remain there.  Cognition  Overall Cognitive Status Difficult to assess  Difficult to assess due to Level of arousal  Cognition - Other Comments Pt with poor arousal, but will respond inconsistently to one step commands (~75%).   Bed Mobility  Bed Mobility Sit to Supine  Rolling Right 1: +2 Total assist  Rolling Right: Patient Percentage 40%  Details for Bed Mobility Assistance VC for sequencing and assist to control trunk and bring bilateral LE into bed  OT - End of Session  Equipment Utilized During Treatment Gait belt  Activity Tolerance (poor arousal)  Patient left in bed;with call bell/phone within reach  Nurse Communication Mobility status  OT Assessment/Plan  Comments on Treatment Session Pt returned to bed.  ADL Goals  Pt Will Perform Grooming with min assist;Supported;Sitting, edge of bed;Sitting at sink  Pt Will Perform Upper Body Bathing with min assist;Sitting, chair;Sitting, edge of bed;Supported  Pt Will Perform Lower Body Bathing with min assist;Sitting, chair;Sitting, edge of bed;Supported  Pt Will Transfer to Toilet with mod assist;Ambulation;with DME;Comfort height toilet  ADL Goal: Toilet Transfer - Progress Progressing toward goals  Miscellaneous OT  Goals  Miscellaneous OT Goal #1 Pt will perform bed mobility with mod assist in prep for EOB ADLs.  OT Goal: Miscellaneous Goal #1 - Progress Progressing toward goals  Miscellaneous OT Goal #2 Pt will tolerate >15 of therapy in order to increase activity tolerance as precursor for ADLs.  OT Goal: Miscellaneous Goal #2 - Progress Progressing toward goals  Miscellaneous OT Goal #3 Pt will set EOB with min assist >5 min as precursor for ADLs.  OT Treatments  $Self Care/Home Management  23-37 mins

## 2011-12-18 NOTE — Consult Note (Signed)
Physical Medicine and Rehabilitation Consult Reason for Consult: CVA Referring Physician: Dr. Pearlean Brownie   HPI: Brendan Scott is a 66 y.o. right-handed male with history of atrial fibrillation on aspirin therapy prior to admission as well as diabetes mellitus. Admitted 12/15/2011 after being found in his driveway by family unresponsive. EMS arrived and noted patient to be slightly confused with left facial droop and dysarthria. MRI of the brain showed a large right middle cerebral artery distribution nonhemorrhagic infarct as well as incidental findings of large left sphenoid wing mass consistent with meningioma. MRA of the head showed high-grade stenosis proximal basilar artery. Echocardiogram with ejection fraction of 60% and no wall motion abnormalities without emboli. Cerebral angiogram showed no occlusions or stenosis. There was  reported seizure 12/16/2011 and patient has been loaded with Keppra /Dilantin as well as vimpat. EEG did not show definite epileptiform discharges. Neurology services consulted presently maintained on aspirin therapy as well as subcutaneous Lovenox for DVT prophylaxis. Patient is not a TPA candidate. Speech therapy followup for dysphagia presently advised n.p.o. Physical and occupational therapy evaluations completed an ongoing with recommendations for physical medicine rehabilitation consult to consider inpatient rehabilitation services   Review of Systems  Cardiovascular: Positive for palpitations.  Gastrointestinal: Positive for constipation.  Musculoskeletal: Positive for myalgias and joint pain.  All other systems reviewed and are negative.   Past Medical History  Diagnosis Date  . Arthritis   . Diabetes mellitus   . Atrial fibrillation    Past Surgical History  Procedure Date  . Total knee arthroplasty     Bilateral   Family History  Problem Relation Age of Onset  . Arthritis    . Diabetes     Social History:  reports that he has quit smoking. He does  not have any smokeless tobacco history on file. He reports that he does not drink alcohol or use illicit drugs. Allergies: No Known Allergies Medications Prior to Admission  Medication Sig Dispense Refill  . aspirin 81 MG tablet Take 81 mg by mouth daily.        . fish oil-omega-3 fatty acids 1000 MG capsule Take 2 g by mouth daily.        . metFORMIN (GLUCOPHAGE) 500 MG tablet Take 500 mg by mouth daily.         Home: Home Living Lives With: Spouse Available Help at Discharge: Family;Available 24 hours/day Type of Home: Mobile home Home Access: Stairs to enter Entrance Stairs-Number of Steps: 4 Entrance Stairs-Rails: Right;Left;Can reach both Home Layout: One level Bathroom Shower/Tub: Walk-in shower;Door [employer] Toilet: Standard Bathroom Accessibility: Yes How Accessible: Accessible via walker Home Adaptive Equipment: Walker - rolling;Shower chair without back  Functional History: Prior Function Able to Take Stairs?: Yes Driving: Yes Vocation: Full time employment Comments: custodian Functional Status:  Mobility: Bed Mobility Bed Mobility: Left Sidelying to Sit Rolling Right: 1: +1 Total assist Rolling Left: 3: Mod assist Left Sidelying to Sit: 1: +2 Total assist Left Sidelying to Sit: Patient Percentage: 50% Supine to Sit: 1: +2 Total assist Supine to Sit: Patient Percentage: 10% Sitting - Scoot to Edge of Bed: 1: +1 Total assist Scooting to HOB: 1: +2 Total assist Scooting to [facility]: Patient Percentage: 0% Transfers Transfers: Sit to Stand;Stand to Sit;Stand Pivot Transfers Sit to Stand: 1: +2 Total assist Sit to Stand: Patient Percentage: 60% Stand to Sit: 1: +2 Total assist Stand to Sit: Patient Percentage: 50% Stand Pivot Transfers: 1: +2 Total assist Stand Pivot Transfers: Patient Percentage: 30%  Ambulation/Gait Ambulation/Gait Assistance: Not tested (comment)    ADL: ADL Grooming: Wash/dry face;Minimal assistance Where Assessed - Grooming: Unsupported  sitting Upper Body Bathing: +1 Total assistance;Simulated Where Assessed - Upper Body Bathing: Supine, head of bed up Lower Body Bathing: Simulated;+1 Total assistance Where Assessed - Lower Body Bathing: Supine, head of bed up Upper Body Dressing: Simulated;+1 Total assistance Where Assessed - Upper Body Dressing: Supine, head of bed up Lower Body Dressing: Simulated;+1 Total assistance Where Assessed - Lower Body Dressing: Supine, head of bed up Toilet Transfer: +2 Total assistance Toilet Transfer Method: Stand pivot Toilet Transfer Equipment:  (bed to chair) Equipment Used: Gait belt Transfers/Ambulation Related to ADLs: +2total A(pt=60%) for sit to stand and stand pivot from bed to chair ADL Comments: Pt lethargic/decreased arousal but would follow one step commands inconsistently, even with eyes closed. Unable to assess vision as pt unable to maintain eyes open- appears to possibly have left eye esophoria?? Pt with strong left head tilt- requires total assist to bring to and maintain head in midline. Pt tends to keep left eye closed.   Cognition: Cognition Arousal/Alertness: Lethargic Orientation Level: Oriented X4 Cognition Overall Cognitive Status: Difficult to assess Area of Impairment: Safety/judgement Difficult to assess due to: Level of arousal Arousal/Alertness: Lethargic Orientation Level: Appears intact for tasks assessed Behavior During Session:  (difficult to arouse) Safety/Judgement: Decreased awareness of need for assistance Cognition - Other Comments: Pt with poor arousal, but will respond inconsistently to one step commands (~75%).   Blood pressure 158/77, pulse 82, temperature 97.9 F (36.6 C), temperature source Oral, resp. rate 20, height 5\' 6"  (1.676 m), weight 99.4 kg (219 lb 2.2 oz), SpO2 98.00%. Physical Exam  Vitals reviewed. HENT:  Head: Normocephalic.  Eyes:       Pupils sluggish to react to light  Neck: Neck supple. No thyromegaly present.    Cardiovascular:       Cardiac rate controlled  Pulmonary/Chest: Effort normal and breath sounds normal. No respiratory distress.  Abdominal: Soft. Bowel sounds are normal. There is no tenderness.  Neurological:       Patient was lethargic and difficult to keep awake for exam. He did speak some simple words and follow some basic one-step commands. Was able to tell me where he was and why he was here. Able to activate proximal greater than distal muscles more in a flexor pattern on the left. Minimal withdraw to pain. Speech dysarthric. Left facial droop.   Skin: Skin is warm and dry.    Results for orders placed during the hospital encounter of 12/15/11 (from the past 24 hour(s))  PHENYTOIN LEVEL, TOTAL     Status: Normal   Collection Time   12/17/11  7:46 PM      Component Value Range   Phenytoin Lvl 20.0  10.0 - 20.0 ug/mL  PHENYTOIN LEVEL, TOTAL     Status: Normal   Collection Time   12/18/11  5:04 AM      Component Value Range   Phenytoin Lvl 18.7  10.0 - 20.0 ug/mL  CBC     Status: Abnormal   Collection Time   12/18/11  5:04 AM      Component Value Range   WBC 10.7 (*) 4.0 - 10.5 K/uL   RBC 5.28  4.22 - 5.81 MIL/uL   Hemoglobin 14.4  13.0 - 17.0 g/dL   HCT 46.9  62.9 - 52.8 %   MCV 80.7  78.0 - 100.0 fL   MCH 27.3  26.0 -  34.0 pg   MCHC 33.8  30.0 - 36.0 g/dL   RDW 16.1  09.6 - 04.5 %   Platelets 188  150 - 400 K/uL  BASIC METABOLIC PANEL     Status: Abnormal   Collection Time   12/18/11  5:04 AM      Component Value Range   Sodium 138  135 - 145 mEq/L   Potassium 3.6  3.5 - 5.1 mEq/L   Chloride 103  96 - 112 mEq/L   CO2 24  19 - 32 mEq/L   Glucose, Bld 128 (*) 70 - 99 mg/dL   BUN 10  6 - 23 mg/dL   Creatinine, Ser 4.09  0.50 - 1.35 mg/dL   Calcium 9.2  8.4 - 81.1 mg/dL   GFR calc non Af Amer 70 (*) >90 mL/min   GFR calc Af Amer 81 (*) >90 mL/min   Mr Laqueta Jean Wo Contrast  12/16/2011  *RADIOLOGY REPORT*  Clinical Data:  Left-sided facial droop.  MRI BRAIN WITH  AND WITHOUT CONTRAST MRA HEAD WITHOUT CONTRAST  Technique: Multiplanar, multiecho pulse sequences of the brain and surrounding structures were obtained according to standard protocol with and without intravenous contrast.  Angiographic images of the Circle of Willis were obtained using MRA technique without intravenous contrast.  Contrast: 20 ml MultiHance.  Comparison11/15/2013 CT.  MRI HEAD  Findings: Motion degraded exam.  Large acute non hemorrhagic right hemispheric infarct (right middle cerebral artery distribution) involving portions of the right frontal lobe, right temporal lobe, right opercular and sub insular region extending to the border with the right parietal lobe.  Questionable tiny infarct left temporal lobe.  Centered at the left sphenoid wing is a 5 x 4.2 x 4.1 cm mass which has an appearance most consistent with a meningioma invading the left cavernous sinus and pituitary region with extension posterior to the left clivus, into the left orbital apex, anterior left middle cranial fossa and surrounding the left internal carotid artery. Mild vasogenic edema along the superior border involving the anterior left frontal lobe.  Prior to any intervention for this particular lesion, follow-up imaging without motion may prove helpful.  Right mastoid air cell opacification.  No obstructing lesion noted in the region of posterior-superior nasopharynx causing right sided eustachian tube dysfunction.  Major dural sinuses are patent.  IMPRESSION: Large right middle cerebral artery distribution non hemorrhagic infarct as detailed above.  Large left sphenoid wing mass consistent with meningioma with invasion as detailed above.  MRA HEAD  Findings: Circumferential narrowing (caused by a meningioma) of the supraclinoid aspect of the left internal carotid artery, the carotid terminus, the A1 segment of the left anterior cerebral artery and slightly less so the M1 segment of the left middle cerebral artery (which is  elevated by the mass).  Decreased caliber/number of visualized left middle cerebral artery branch vessels may indicate flow limiting stenosis by the meningioma.  Right internal carotid artery/carotid terminus without significant narrowing.  No significant narrowing of the M1 segment or A1 segment of the right middle cerebral artery/anterior cerebral artery respectively.  Right vertebral artery is dominant.  Moderate to marked tandem stenosis left vertebral artery.  High-grade stenosis proximal basilar artery.  Nonvisualization left PICA and both AICAs.  Moderate to marked narrowing right PICA.  Irregularity and narrowing of portions of the superior cerebral artery and posterior cerebral artery distal branches.  Minimal bulbous appearance of the basilar tip without discrete aneurysm.  IMPRESSION:  Circumferential narrowing (caused by a meningioma) of  the supraclinoid aspect of the left internal carotid artery, the carotid terminus, the A1 segment of the left anterior cerebral artery and slightly less so the M1 segment of the left middle cerebral artery (which is elevated by the mass).  Decreased caliber/number of visualized left middle cerebral artery branch vessels may indicate flow limiting stenosis by the meningioma.  Right internal carotid artery/carotid terminus without significant narrowing.  No significant narrowing of the M1 segment or A1 segment of the right middle cerebral artery/anterior cerebral artery respectively.  Right vertebral artery is dominant.  Moderate to marked tandem stenosis left vertebral artery.  High-grade stenosis proximal basilar artery.  Critical Value/emergent results were called by telephone at the time of interpretation on 12/16/2011 at 6:30 p.m. to Tammy patients nurse., who verbally acknowledged these results.   Original Report Authenticated By: Lacy Duverney, M.D.    Mr Mra Head/brain Wo Cm  12/16/2011  *RADIOLOGY REPORT*  Clinical Data:  Left-sided facial droop.  MRI BRAIN  WITH AND WITHOUT CONTRAST MRA HEAD WITHOUT CONTRAST  Technique: Multiplanar, multiecho pulse sequences of the brain and surrounding structures were obtained according to standard protocol with and without intravenous contrast.  Angiographic images of the Circle of Willis were obtained using MRA technique without intravenous contrast.  Contrast: 20 ml MultiHance.  Comparison11/15/2013 CT.  MRI HEAD  Findings: Motion degraded exam.  Large acute non hemorrhagic right hemispheric infarct (right middle cerebral artery distribution) involving portions of the right frontal lobe, right temporal lobe, right opercular and sub insular region extending to the border with the right parietal lobe.  Questionable tiny infarct left temporal lobe.  Centered at the left sphenoid wing is a 5 x 4.2 x 4.1 cm mass which has an appearance most consistent with a meningioma invading the left cavernous sinus and pituitary region with extension posterior to the left clivus, into the left orbital apex, anterior left middle cranial fossa and surrounding the left internal carotid artery. Mild vasogenic edema along the superior border involving the anterior left frontal lobe.  Prior to any intervention for this particular lesion, follow-up imaging without motion may prove helpful.  Right mastoid air cell opacification.  No obstructing lesion noted in the region of posterior-superior nasopharynx causing right sided eustachian tube dysfunction.  Major dural sinuses are patent.  IMPRESSION: Large right middle cerebral artery distribution non hemorrhagic infarct as detailed above.  Large left sphenoid wing mass consistent with meningioma with invasion as detailed above.  MRA HEAD  Findings: Circumferential narrowing (caused by a meningioma) of the supraclinoid aspect of the left internal carotid artery, the carotid terminus, the A1 segment of the left anterior cerebral artery and slightly less so the M1 segment of the left middle cerebral artery  (which is elevated by the mass).  Decreased caliber/number of visualized left middle cerebral artery branch vessels may indicate flow limiting stenosis by the meningioma.  Right internal carotid artery/carotid terminus without significant narrowing.  No significant narrowing of the M1 segment or A1 segment of the right middle cerebral artery/anterior cerebral artery respectively.  Right vertebral artery is dominant.  Moderate to marked tandem stenosis left vertebral artery.  High-grade stenosis proximal basilar artery.  Nonvisualization left PICA and both AICAs.  Moderate to marked narrowing right PICA.  Irregularity and narrowing of portions of the superior cerebral artery and posterior cerebral artery distal branches.  Minimal bulbous appearance of the basilar tip without discrete aneurysm.  IMPRESSION:  Circumferential narrowing (caused by a meningioma) of the supraclinoid aspect of the left  internal carotid artery, the carotid terminus, the A1 segment of the left anterior cerebral artery and slightly less so the M1 segment of the left middle cerebral artery (which is elevated by the mass).  Decreased caliber/number of visualized left middle cerebral artery branch vessels may indicate flow limiting stenosis by the meningioma.  Right internal carotid artery/carotid terminus without significant narrowing.  No significant narrowing of the M1 segment or A1 segment of the right middle cerebral artery/anterior cerebral artery respectively.  Right vertebral artery is dominant.  Moderate to marked tandem stenosis left vertebral artery.  High-grade stenosis proximal basilar artery.  Critical Value/emergent results were called by telephone at the time of interpretation on 12/16/2011 at 6:30 p.m. to Tammy patients nurse., who verbally acknowledged these results.   Original Report Authenticated By: Lacy Duverney, M.D.     Assessment/Plan: Diagnosis: right MCA infarct  1. Does the need for close, 24 hr/day medical  supervision in concert with the patient's rehab needs make it unreasonable for this patient to be served in a less intensive setting? Yes 2. Co-Morbidities requiring supervision/potential complications: dm, seizures, pain syndrome 3. Due to bladder management, bowel management, safety, skin/wound care, disease management, medication administration, pain management and patient education, does the patient require 24 hr/day rehab nursing? Yes 4. Does the patient require coordinated care of a physician, rehab nurse, PT (1-2 hrs/day, 5 days/week), OT (1-2 hrs/day, 5 days/week) and SLP (1-2 hrs/day, 5 days/week) to address physical and functional deficits in the context of the above medical diagnosis(es)? Yes Addressing deficits in the following areas: balance, endurance, locomotion, strength, transferring, bowel/bladder control, bathing, dressing, feeding, grooming, toileting, cognition, speech, language, swallowing and psychosocial support 5. Can the patient actively participate in an intensive therapy program of at least 3 hrs of therapy per day at least 5 days per week? Yes 6. The potential for patient to make measurable gains while on inpatient rehab is excellent 7. Anticipated functional outcomes upon discharge from inpatient rehab are supervision  with PT, supervision to min with OT, supervision  with SLP. 8. Estimated rehab length of stay to reach the above functional goals is: 2-3 weeks 9. Does the patient have adequate social supports to accommodate these discharge functional goals? Yes, potentially 10. Anticipated D/C setting: Home 11. Anticipated post D/C treatments: HH therapy 12. Overall Rehab/Functional Prognosis: excellent  RECOMMENDATIONS: This patient's condition is appropriate for continued rehabilitative care in the following setting: CIR Patient has agreed to participate in recommended program. Yes Note that insurance prior authorization may be required for reimbursement for  recommended care.  Comment: Rehab rn to follow up.   Ivory Broad, MD    12/18/2011

## 2011-12-18 NOTE — Progress Notes (Signed)
Stroke Team Progress Note  HISTORY Brendan Scott is an 66 y.o. male who was last seen normal at 14:30 on 12/14/2101 when he left for work. Patient never showed up to work. Family then found him in driveway unresponsive and called EMS. EMS arrived to find patient standing in driveway slightly confused, left facial droop and dysarthria. Code stroke was called and patient was brought to [facility] hospital. Patient remained with left facial droop, right gaze preference, unable to cross midline, dysarthria and left hemianopsia. CT scan of the head without contrast showed evidence of a fairly large mass lesion involving the left frontotemporal region as well as possible increased density of the right middle cerebral artery proximally, and possibly early MCA territory infarction. Mass lesion on the left was thought to be incidental and likely a meningioma. As such, he was not considered a candidate for intravenous thrombolytic therapy with TPA. Patient was subsequently taken to Interventional Radiology for cerebral arteriogram was performed which showed no signs of occlusion of the right middle cerebral artery nor right internal carotid artery. There was mass effect involving the left middle cerebral artery with reduced diameter and straightening. Chronic permanent tumor blush was also seen. NIH stroke score was 8, including visual field defect and neglect of his left side as well as facial weakness and extremity weakness, and slurred speech. Modified Rankin score was 3. Patient was taking aspirin 81 mg per day prior to admission. He was admitted to the neuro ICU for further evaluation and treatment.  SUBJECTIVE Continues with left facial twitching. Now on DPH, LEV and vimpat. Family at bedside and updated.   OBJECTIVE Most recent Vital Signs: Filed Vitals:   12/18/11 0700 12/18/11 0800 12/18/11 0900 12/18/11 1000  BP: 153/84 151/82 158/77   Pulse: 76 78 75 82  Temp:  97.9 F (36.6 C)    TempSrc:  Oral    Resp: 29  28 27 20   Height:      Weight:      SpO2: 99% 100% 100% 98%   CBG (last 3)   Basename 12/16/11 0310  GLUCAP 100*    IV Fluid Intake:     . sodium chloride 75 mL/hr at 12/18/11 1000    MEDICATIONS    . antiseptic oral rinse  15 mL Mouth Rinse BID  . aspirin  300 mg Rectal Daily  . clonazePAM  0.25 mg Oral BID  . enoxaparin (LOVENOX) injection  40 mg Subcutaneous Q24H  . [COMPLETED] lacosamide (VIMPAT) IV  100 mg Intravenous STAT  . [COMPLETED] lacosamide (VIMPAT) IV  100 mg Intravenous Q12H  . lacosamide (VIMPAT) IV  100 mg Intravenous Q12H  . levetiracetam  1,000 mg Intravenous Q8H  . [COMPLETED] LORazepam  1 mg Intravenous Once  . [COMPLETED] LORazepam  1 mg Intravenous Once  . LORazepam  2 mg Intravenous Once  . phenytoin (DILANTIN) IV  100 mg Intravenous Q8H  . senna-docusate  1 tablet Oral QHS   PRN:  fentaNYL, midazolam  Diet:  NPO  Activity:  Up with assistance to chair DVT Prophylaxis:  Lovenox 40 mg sq daily   CLINICALLY SIGNIFICANT STUDIES Basic Metabolic Panel:  Lab 12/18/11 1610 12/17/11 0448  NA 138 139  K 3.6 3.5  CL 103 103  CO2 24 26  GLUCOSE 128* 141*  BUN 10 7  CREATININE 1.08 1.00  CALCIUM 9.2 9.1  MG -- --  PHOS -- --   Liver Function Tests:  Lab 12/17/11 0448 12/15/11 1550  AST 20  26  ALT 22 36  ALKPHOS 65 76  BILITOT 0.8 0.3  PROT 6.7 7.5  ALBUMIN 3.5 4.1   CBC:  Lab 12/18/11 0504 12/17/11 0448 12/15/11 1550  WBC 10.7* 9.5 --  NEUTROABS -- 7.3 9.2*  HGB 14.4 13.9 --  HCT 42.6 42.9 --  MCV 80.7 81.7 --  PLT 188 184 --   Coagulation:  Lab 12/15/11 1550  LABPROT 12.9  INR 0.98   Cardiac Enzymes:  Lab 12/15/11 1550  CKTOTAL --  CKMB --  CKMBINDEX --  TROPONINI <0.30   Urinalysis: No results found for this basename: COLORURINE:2,APPERANCEUR:2,LABSPEC:2,PHURINE:2,GLUCOSEU:2,HGBUR:2,BILIRUBINUR:2,KETONESUR:2,PROTEINUR:2,UROBILINOGEN:2,NITRITE:2,LEUKOCYTESUR:2 in the last 168 hours Lipid Panel    Component Value  Date/Time   CHOL 184 12/16/2011 0430   TRIG 51 12/16/2011 0430   HDL 60 12/16/2011 0430   CHOLHDL 3.1 12/16/2011 0430   VLDL 10 12/16/2011 0430   LDLCALC 114* 12/16/2011 0430   HgbA1C  Lab Results  Component Value Date   HGBA1C 6.9* 12/16/2011    Urine Drug Screen:   No results found for this basename: labopia, cocainscrnur, labbenz, amphetmu, thcu, labbarb    Alcohol Level: No results found for this basename: ETH:2 in the last 168 hours  CT of the brain   12/16/2011  Exam is markedly motion degraded.  Findings raise possibility of progressive right hemispheric infarct.  Limited for detection of intracranial hemorrhage.  Left anterior left temporal lobe/ clinoid/cavernous sinus region hyperdense lesions suggestive of a meningioma.  This can be evaluated with contrast enhanced MR.  12/15/2011  Right middle cerebral artery slightly dense.  There is minimal hypodensity right opercular region. This may reflect changes of early acute infarct.  Large hyperdense mass centered at the left clinoid with extension into the left middle cranial fossa, left cavernous sinus, left subfrontal region and orbital apex region having appearance suggestive of a meningioma with bony remodeling.  This can be assessed with contrast enhanced MR.   Hypodensity adjacent left frontal lobe may reflect vasogenic edema.  No intracranial hemorrhage.  Prior right mastoidectomy.  Mild thickening along the peripheral aspect of the mastoidectomy site.  Partial opacification of residual right mastoid air cells.   Cerebral Angiogram 12/14/2101 S/P 4 vessel cerebral arteriogram. RT CFA approach. 1. No occlusions ,stenosis dissections or intraluminal filling defects seen . 2. Venous outflow WNLs.  MRI of the brain  12/16/2011   Large right middle cerebral artery distribution non hemorrhagic infarct as detailed above.  Large left sphenoid wing mass consistent with meningioma with invasion    MRA of the brain  12/16/2011     Circumferential narrowing (caused by a meningioma) of the supraclinoid aspect of the left internal carotid artery, the carotid terminus, the A1 segment of the left anterior cerebral artery and slightly less so the M1 segment of the left middle cerebral artery (which is elevated by the mass).  Decreased caliber/number of visualized left middle cerebral artery branch vessels may indicate flow limiting stenosis by the meningioma.  Right internal carotid artery/carotid terminus without significant narrowing.  No significant narrowing of the M1 segment or A1 segment of the right middle cerebral artery/anterior cerebral artery respectively.  Right vertebral artery is dominant.  Moderate to marked tandem stenosis left vertebral artery.  High-grade stenosis proximal basilar artery.   2D Echocardiogram  EF 55-60% with no source of embolus. Patient in atrial fibrillation   Carotid Doppler  See cerebral angio  CXR  12/15/2011   Mild cardiac enlargement.  No active infiltrates or failure.  EKG  ATRIAL FIBRILLATION, V-RATE 68- 92 ~ var'd rate, irreg atrial activity  LATERAL INFARCT, OLD ~ Q>57mS, abnormal ST-T, I aVL V5-6  ABNRM R PROG, CONSIDER ASMI OR LEAD PLACEMENT ~ Q >68mS, diminished R, V2  Therapy Recommendations PT - CIR; OT - CIR  Physical Exam   Patient is sleepy, but he is cooperative at the time of examination.  Frequent twitching of the left face is noted.  Speech is dysarthric, not aphasic.  Respiratory examination is clear.  Cardiovascular examination reveals an occasional irregular heart rhythm, no obvious murmurs or rubs are noted.  No carotid bruits are noted.  Abdomen is obese, soft and nontender, positive bowel sounds.  Extremities are without significant edema  The patient has clear left facial droop, some conjugate eye deviation to the right. The patient has a dense left homonymous visual field deficit.  The patient has a left hemiparesis, left arm greater than left leg. Right side  appears to have normal strength.  No obvious ataxia seen with the extremities.  The patient could not be ambulated.  Deep tendon reflexes are symmetric.   ASSESSMENT Mr. Brendan Scott is a 66 y.o. male presenting with confusion, left facial droop and dysarthria. Suspect right brain embolic event, though patient also has acute seizures in setting of newly identified left frontal and anterior temporal meningioma. MRI imaging pending. ? Embolic infarct vs post-ictal symptoms; leaning toward stroke given ongoing symptoms in awake and alert patient.  Work up underway. On aspirin 81 mg orally every day prior to admission. Now on aspirin 300 mg rectally every day for secondary stroke prevention.   atrial fibrillation Diabetes, HgbA1c 6.9 Hyperlipidemia, LDL 114, not on statin PTA, goal LDL < 100. On fish oil Obesity class 2, Body mass index is 35.37 kg/(m^2). Focal status epilepticus with ongoing simple partial seizures involving left side of his face, EEG without epileptiform discharges, on dilantin (level stable at 18.7 today), vimpat, and keppra Dysphagia secondary to stroke/seizure  Hospital day # 3  TREATMENT/PLAN  Continue aspirin 300 rectally every day for secondary stroke prevention. Will consider anticoagulation for new afib once pt stabilizes  Change Keppra to 1500 mg q 12  Change Vimpat to 200 mg q 12  Rehab consult as recommended by thearpy  Continue ICU monitoring as seizures remain ongoing  Repeat MRI of the brain once seizures controlled x 24 hours (new stroke vs post-ictal sx)  Neurosurgery consult for meningioma recommended by [facility] (? If called, no consult at time of this note)  Consider statin if MRI positive for stroke  Brendan Main, MSN, RN, ANVP-BC, ANP-BC, [facility] Pager: [phone] 12/18/2011 10:32 AM  [facility] - Stroke Team Brendan Schmid, MD 12/18/2011, 7:49 PM   Please refer to [email or web address] for on-call Stroke  MD

## 2011-12-19 ENCOUNTER — Inpatient Hospital Stay (HOSPITAL_COMMUNITY): Payer: BC Managed Care – PPO

## 2011-12-19 MED ORDER — SIMVASTATIN 20 MG PO TABS
20.0000 mg | ORAL_TABLET | Freq: Every day | ORAL | Status: DC
  Administered 2011-12-19 – 2011-12-27 (×9): 20 mg via ORAL
  Filled 2011-12-19 (×9): qty 1

## 2011-12-19 MED ORDER — LISINOPRIL 5 MG PO TABS
5.0000 mg | ORAL_TABLET | Freq: Every day | ORAL | Status: DC
  Administered 2011-12-19 – 2011-12-27 (×9): 5 mg via ORAL
  Filled 2011-12-19 (×9): qty 1

## 2011-12-19 MED ORDER — CLONAZEPAM 0.5 MG PO TABS
0.5000 mg | ORAL_TABLET | Freq: Two times a day (BID) | ORAL | Status: DC
  Administered 2011-12-19 – 2011-12-24 (×10): 0.5 mg via ORAL
  Filled 2011-12-19 (×10): qty 1

## 2011-12-19 MED ORDER — JEVITY 1.2 CAL PO LIQD
1000.0000 mL | ORAL | Status: DC
  Administered 2011-12-19: 1000 mL
  Filled 2011-12-19 (×2): qty 1000

## 2011-12-19 NOTE — Progress Notes (Signed)
During change of shift, pt was found to have NG tube coiled completely inside of the mouth. NG was removed at this time. Will continue to monitor pt.   Cyndie Chime [place]

## 2011-12-19 NOTE — Progress Notes (Signed)
Physical Therapy Treatment Patient Details Name: Brendan Scott MRN: [id number] DOB: 1946-01-29 Today's Date: 12/19/2011 Time: 4742-5956 PT Time Calculation (min): 25 min  PT Assessment / Plan / Recommendation Comments on Treatment Session  Pt more alert this session and ready to get OOB.  Pt able to ambulate this session with max cues.  Pt continues to need cues to turn and look to left side and occsaionally performs left head turn to find person speaking without cues.  Pt unable to track past midline to from right to left.     Follow Up Recommendations  CIR;Supervision/Assistance - 24 hour     Does the patient have the potential to tolerate intense rehabilitation     Barriers to Discharge        Equipment Recommendations  3 in 1 bedside comode    Recommendations for Other Services Rehab consult  Frequency Min 4X/week   Plan Frequency remains appropriate;Discharge plan needs to be updated    Precautions / Restrictions Precautions Precautions: Fall Restrictions Weight Bearing Restrictions: No   Pertinent Vitals/Pain No c/o pain    Mobility  Bed Mobility Bed Mobility: Sit to Supine Supine to Sit: 3: Mod assist Details for Bed Mobility Assistance: (A) to elevate trunk OOB with tactile and verbal cues to advance LE OOB. Transfers Transfers: Sit to Stand;Stand to Sit;Stand Pivot Transfers Sit to Stand: 1: +2 Total assist Sit to Stand: Patient Percentage: 60% Stand to Sit: 1: +2 Total assist Stand to Sit: Patient Percentage: 60% Stand Pivot Transfers: 1: +2 Total assist Stand Pivot Transfers: Patient Percentage: 60% Details for Transfer Assistance: +2 (A) to initiate transfer and slowly descend to recliner with max cues for hand placement.  Pt needed manual cues to advance LE during transfer to recliner.   Ambulation/Gait Ambulation/Gait Assistance: 1: +2 Total assist Ambulation/Gait: Patient Percentage: 60% Ambulation Distance (Feet): 8 Feet Assistive device: 2 person  hand held assist Ambulation/Gait Assistance Details: +2 (A) to maintain balance with max VC, tactile and manual cues for appropriate step sequence and proper weight shifts to advance LE.  Manual cues to prevent occasional left knee buckling. Gait Pattern: Step-to pattern;Decreased stride length;Shuffle;Scissoring;Narrow base of support Modified Rankin (Stroke Patients Only) Pre-Morbid Rankin Score: No symptoms Modified Rankin: Severe disability    Exercises     PT Diagnosis:    PT Problem List:   PT Treatment Interventions:     PT Goals Acute Rehab PT Goals PT Goal Formulation: With patient Time For Goal Achievement: 12/30/11 Potential to Achieve Goals: Fair Pt will go Supine/Side to Sit: with supervision PT Goal: Supine/Side to Sit - Progress: Progressing toward goal Pt will go Sit to Supine/Side: with supervision PT Goal: Sit to Supine/Side - Progress: Progressing toward goal Pt will go Sit to Stand: with min assist PT Goal: Sit to Stand - Progress: Progressing toward goal Pt will go Stand to Sit: with min assist PT Goal: Stand to Sit - Progress: Progressing toward goal Pt will Transfer Bed to Chair/Chair to Bed: with mod assist PT Transfer Goal: Bed to Chair/Chair to Bed - Progress: Progressing toward goal Pt will Ambulate: 16 - 50 feet;with mod assist;with least restrictive assistive device PT Goal: Ambulate - Progress: Progressing toward goal  Visit Information  Last PT Received On: 12/19/11 Assistance Needed: +2    Subjective Data      Cognition  Overall Cognitive Status: Impaired Area of Impairment: Attention;Safety/judgement;Problem solving Difficult to assess due to: Level of arousal Arousal/Alertness:  (more alert this session however  occasional keeps eyes closed) Orientation Level: Appears intact for tasks assessed Behavior During Session:  (Pt occasional keeps eyes closed however question if vision ) Current Attention Level: Sustained Safety/Judgement:  Decreased awareness of need for assistance Safety/Judgement - Other Comments:  (decrease awareness of left sided weakness) Problem Solving: Pt slow to process and needs extra time to complete task. Cognition - Other Comments: Pt more alert this session however intermittently closes eyes however question if vision impairment is the rearson however difficult to assess at this time.    Balance  Balance Balance Assessed: Yes Static Sitting Balance Static Sitting - Balance Support: Feet supported;Bilateral upper extremity supported Static Sitting - Level of Assistance: 5: Stand by assistance Static Standing Balance Static Standing - Balance Support: Bilateral upper extremity supported;During functional activity Static Standing - Level of Assistance: 1: +2 Total assist Static Standing - Comment/# of Minutes: +2 (A) to maintain balance and promoted midline and upright posture.  End of Session PT - End of Session Equipment Utilized During Treatment: Gait belt Activity Tolerance: Patient tolerated treatment well Patient left: in chair;with call bell/phone within reach Nurse Communication: Mobility status   GP     Vandora Jaskulski 12/19/2011, 9:34 AM

## 2011-12-19 NOTE — Progress Notes (Addendum)
Speech Language Pathology Dysphagia Treatment Patient Details Name: Brendan Scott MRN: [id number] DOB: 1945/08/21 Today's Date: 12/19/2011 Time: 0935-1000 SLP Time Calculation (min): 25 min  Assessment / Plan / Recommendation Clinical Impression  Treatment focused on differential diagnosis of swallowing abilitites to determine potential to resume a po diet. Oral motor exam complete. Patient continues to present with right greater than left facial twitching in addition to left sided facial and oral weakness which according to MD is indicative of continued seizure activity. Discussed with MD and agree with decision to hold po trials until seizure activity has ceased due to risk for decreased airway protection. SLP will continue to f/u at bedside. AFter discussion with MD, additional decision made to place NG and provide meds as well via NG until seizures are under control.   Additonally, holding SLE until seizure activity has stopped to get a clear picture of abilities.    Diet Recommendation  Continue with Current Diet: NPO    SLP Plan Goals updated   Pertinent Vitals/Pain n/a   Swallowing Goals  SLP Swallowing Goals Patient will consume recommended diet without observed clinical signs of aspiration with: Moderate assistance Swallow Study Goal #1 - Progress: Discontinued (comment) Patient will utilize recommended strategies during swallow to increase swallowing safety with: Moderate assistance Swallow Study Goal #2 - Progress: Discontinued (comment) (NPO due to seizure activity) Goal #3: Patient will present with absence of facial twitching/seizure activity in order to orally transit clinician provided po trials at bedside with min assist for use of compensatory strategies.  Swallow Study Goal #3 - Progress: Not met  General Temperature Spikes Noted: Yes Respiratory Status: Room air Behavior/Cognition: Cooperative;Pleasant mood;Lethargic (easily aroused, right > left facial  twitching) Oral Cavity - Dentition: Dentures, top Patient Positioning: Upright in chair  Oral Cavity - Oral Hygiene Brush patient's teeth BID with toothbrush (using toothpaste with fluoride): Yes Patient is HIGH RISK - Oral Care Protocol followed (see row info): Yes Patient is AT RISK - Oral Care Protocol followed (see row info): Yes   Dysphagia Treatment Treatment focused on: Facilitation of oral phase;Upgraded PO texture trials Treatment Methods/Modalities: Skilled observation;Differential diagnosis Patient observed directly with PO's: No Reason PO's not observed: Other (comment) (continued right > left facial twitching)   GO    Ferdinand Lango MA, CCC-SLP [phone]  Brendan Scott 12/19/2011, 10:05 AM

## 2011-12-19 NOTE — Progress Notes (Signed)
Stroke Team Progress Note  HISTORY Brendan Scott is an 66 y.o. male who was last seen normal at 14:30 on 12/14/2101 when he left for work. Patient never showed up to work. Family then found him in driveway unresponsive and called EMS. EMS arrived to find patient standing in driveway slightly confused, left facial droop and dysarthria. Code stroke was called and patient was brought to [facility] hospital. Patient remained with left facial droop, right gaze preference, unable to cross midline, dysarthria and left hemianopsia. CT scan of the head without contrast showed evidence of a fairly large mass lesion involving the left frontotemporal region as well as possible increased density of the right middle cerebral artery proximally, and possibly early MCA territory infarction. Mass lesion on the left was thought to be incidental and likely a meningioma. As such, he was not considered a candidate for intravenous thrombolytic therapy with TPA. Patient was subsequently taken to Interventional Radiology for cerebral arteriogram was performed which showed no signs of occlusion of the right middle cerebral artery nor right internal carotid artery. There was mass effect involving the left middle cerebral artery with reduced diameter and straightening. Chronic permanent tumor blush was also seen. NIH stroke score was 8, including visual field defect and neglect of his left side as well as facial weakness and extremity weakness, and slurred speech. Modified Rankin score was 3. Patient was taking aspirin 81 mg per day prior to admission. He was admitted to the neuro ICU for further evaluation and treatment.  SUBJECTIVE Left facial twitching continues, but slightly improved. More awake today.    OBJECTIVE Most recent Vital Signs: Filed Vitals:   12/19/11 1200 12/19/11 1300 12/19/11 1400 12/19/11 1544  BP: 159/73 174/85 164/75   Pulse: 78 80 79   Temp: 98.2 F (36.8 C)   98.7 F (37.1 C)  TempSrc: Oral   Oral  Resp: 26 23  24    Height:      Weight:      SpO2: 98% 98% 98%     IV Fluid Intake:     . sodium chloride 75 mL/hr at 12/19/11 1400   MEDICATIONS    . antiseptic oral rinse  15 mL Mouth Rinse BID  . aspirin  300 mg Rectal Daily  . clonazePAM  0.25 mg Oral BID  . enoxaparin (LOVENOX) injection  40 mg Subcutaneous Q24H  . lacosamide (VIMPAT) IV  200 mg Intravenous Q12H  . levetiracetam  1,500 mg Intravenous Q12H  . LORazepam  2 mg Intravenous Once  . phenytoin (DILANTIN) IV  100 mg Intravenous Q8H  . senna-docusate  1 tablet Oral QHS   PRN:  fentaNYL, midazolam  Diet:  NPO  Activity:  Up with assistance to chair DVT Prophylaxis:  Lovenox 40 mg sq daily   CLINICALLY SIGNIFICANT STUDIES Basic Metabolic Panel:   Lab 12/18/11 0504 12/17/11 0448  NA 138 139  K 3.6 3.5  CL 103 103  CO2 24 26  GLUCOSE 128* 141*  BUN 10 7  CREATININE 1.08 1.00  CALCIUM 9.2 9.1  MG -- --  PHOS -- --   Liver Function Tests:   Lab 12/17/11 0448 12/15/11 1550  AST 20 26  ALT 22 36  ALKPHOS 65 76  BILITOT 0.8 0.3  PROT 6.7 7.5  ALBUMIN 3.5 4.1   CBC:   Lab 12/18/11 0504 12/17/11 0448 12/15/11 1550  WBC 10.7* 9.5 --  NEUTROABS -- 7.3 9.2*  HGB 14.4 13.9 --  HCT 42.6 42.9 --  MCV 80.7 81.7 --  PLT 188 184 --   Coagulation:   Lab 12/15/11 1550  LABPROT 12.9  INR 0.98   Cardiac Enzymes:   Lab 12/15/11 1550  CKTOTAL --  CKMB --  CKMBINDEX --  TROPONINI <0.30   Urinalysis: No results found for this basename: COLORURINE:2,APPERANCEUR:2,LABSPEC:2,PHURINE:2,GLUCOSEU:2,HGBUR:2,BILIRUBINUR:2,KETONESUR:2,PROTEINUR:2,UROBILINOGEN:2,NITRITE:2,LEUKOCYTESUR:2 in the last 168 hours Lipid Panel    Component Value Date/Time   CHOL 184 12/16/2011 0430   TRIG 51 12/16/2011 0430   HDL 60 12/16/2011 0430   CHOLHDL 3.1 12/16/2011 0430   VLDL 10 12/16/2011 0430   LDLCALC 114* 12/16/2011 0430   HgbA1C  Lab Results  Component Value Date   HGBA1C 6.9* 12/16/2011    Urine Drug Screen:   No results  found for this basename: labopia,  cocainscrnur,  labbenz,  amphetmu,  thcu,  labbarb    Alcohol Level: No results found for this basename: ETH:2 in the last 168 hours  CT of the brain   12/16/2011  Exam is markedly motion degraded.  Findings raise possibility of progressive right hemispheric infarct.  Limited for detection of intracranial hemorrhage.  Left anterior left temporal lobe/ clinoid/cavernous sinus region hyperdense lesions suggestive of a meningioma.  This can be evaluated with contrast enhanced MR.  12/15/2011  Right middle cerebral artery slightly dense.  There is minimal hypodensity right opercular region. This may reflect changes of early acute infarct.  Large hyperdense mass centered at the left clinoid with extension into the left middle cranial fossa, left cavernous sinus, left subfrontal region and orbital apex region having appearance suggestive of a meningioma with bony remodeling.  This can be assessed with contrast enhanced MR.   Hypodensity adjacent left frontal lobe may reflect vasogenic edema.  No intracranial hemorrhage.  Prior right mastoidectomy.  Mild thickening along the peripheral aspect of the mastoidectomy site.  Partial opacification of residual right mastoid air cells.   Cerebral Angiogram 12/14/2101 S/P 4 vessel cerebral arteriogram. RT CFA approach. 1. No occlusions ,stenosis dissections or intraluminal filling defects seen . 2. Venous outflow WNLs.  MRI of the brain  12/16/2011   Large right middle cerebral artery distribution non hemorrhagic infarct.  Large left sphenoid wing mass consistent with meningioma with invasion    MRA of the brain  12/16/2011    Circumferential narrowing (caused by a meningioma) of the supraclinoid aspect of the left internal carotid artery, the carotid terminus, the A1 segment of the left anterior cerebral artery and slightly less so the M1 segment of the left middle cerebral artery (which is elevated by the mass).  Decreased  caliber/number of visualized left middle cerebral artery branch vessels may indicate flow limiting stenosis by the meningioma.  Right internal carotid artery/carotid terminus without significant narrowing.  No significant narrowing of the M1 segment or A1 segment of the right middle cerebral artery/anterior cerebral artery respectively.  Right vertebral artery is dominant.  Moderate to marked tandem stenosis left vertebral artery.  High-grade stenosis proximal basilar artery.   2D Echocardiogram  EF 55-60% with no source of embolus. Patient in atrial fibrillation   Carotid Doppler  See cerebral angio  CXR  12/15/2011   Mild cardiac enlargement.  No active infiltrates or failure.   EKG  ATRIAL FIBRILLATION, V-RATE 68- 92 ~ var'd rate, irreg atrial activity  LATERAL INFARCT, OLD ~ Q>25mS, abnormal ST-T, I aVL V5-6  ABNRM R PROG, CONSIDER ASMI OR LEAD PLACEMENT ~ Q >29mS, diminished R, V2  Therapy Recommendations PT - CIR; OT - CIR  Physical Exam   Patient is more awake and is cooperative at the time of examination.  Continuous twitching of the left face and left arm is noted.  Speech is dysarthric. Language is fluent and comprehension intact. Respiratory examination is clear.  Cardiovascular examination reveals an occasional irregular heart rhythm, no obvious murmurs or rubs are noted.  No carotid bruits are noted.  Abdomen is obese, soft and nontender, positive bowel sounds.  Extremities are without significant edema  The patient has clear left facial droop, some conjugate eye deviation to the right. The patient has a dense left homonymous visual field deficit.  The patient has a left hemiparesis, left arm greater than left leg. Right side appears to have normal strength.  No obvious ataxia seen with the extremities.  The patient could not be ambulated.  Deep tendon reflexes are symmetric.   ASSESSMENT Mr. Brendan Scott is a 66 y.o. male presenting with confusion, left facial droop and  dysarthria. MRI confirms a large right middle cerebral artery distribution non hemorrhagic infarct. Patient also has acute seizures in setting of newly identified left frontal and anterior temporal meningioma. Infarct likely embolic infarct given atrial fibrillation. On aspirin 81 mg orally every day prior to admission. Now on aspirin 300 mg rectally every day for secondary stroke prevention.   atrial fibrillation Diabetes, HgbA1c 6.9 Hyperlipidemia, LDL 114, not on statin PTA, goal LDL < 100. On fish oil Obesity class 2, Body mass index is 35.37 kg/(m^2). Focal status epilepticus with ongoing simple partial seizures involving left side of his face, EEG without epileptiform discharges, on dilantin (level stable at 18.7 today), vimpat, keppra, and klonopin Dysphagia secondary to stroke/seizure Hypertension  Hospital day # 4  TREATMENT/PLAN  Continue aspirin 300 rectally every day for secondary stroke prevention. Will consider anticoagulation for new afib once pt stabilizes, is able to swallow  ST following swallow  Increase klonopin to 0.5mg  bid to assist with seizure control  Repeat CT in am  Neurosurgery consult for meningioma recommended by [facility] (? If called, no consult at time of this note)  Add statin   Place NG and start tube feedings. Keep in ICU to ensure respiratory status stable after placement given ongoing seizures  Rehab candidate at time of discharge   Add BP medication (lisinopril 5)  SHARON BIBY, MSN, RN, ANVP-BC, ANP-BC, [facility] Pager: [phone] 12/19/2011 4:55 PM  [facility] - Stroke Team Joycelyn Schmid, MD 12/19/2011, 4:55 PM   Please refer to [email or web address] for on-call Stroke MD

## 2011-12-20 ENCOUNTER — Encounter (HOSPITAL_COMMUNITY): Payer: Self-pay | Admitting: Radiology

## 2011-12-20 ENCOUNTER — Inpatient Hospital Stay (HOSPITAL_COMMUNITY): Payer: BC Managed Care – PPO

## 2011-12-20 MED ORDER — JEVITY 1.2 CAL PO LIQD
1000.0000 mL | ORAL | Status: DC
  Administered 2011-12-20: 1000 mL
  Administered 2011-12-21 – 2011-12-22 (×2)
  Administered 2011-12-22 – 2011-12-25 (×4): 1000 mL
  Filled 2011-12-20 (×12): qty 1000

## 2011-12-20 MED ORDER — ASPIRIN EC 81 MG PO TBEC
81.0000 mg | DELAYED_RELEASE_TABLET | Freq: Every day | ORAL | Status: DC
  Administered 2011-12-21: 81 mg via ORAL
  Filled 2011-12-20 (×2): qty 1

## 2011-12-20 MED ORDER — LABETALOL HCL 5 MG/ML IV SOLN: 10.0000 mg | INTRAVENOUS | Status: DC | PRN

## 2011-12-20 MED ORDER — SODIUM CHLORIDE 0.9 % IV SOLN
300.0000 mg | Freq: Two times a day (BID) | INTRAVENOUS | Status: DC
  Administered 2011-12-20 – 2011-12-27 (×15): 300 mg via INTRAVENOUS
  Filled 2011-12-20 (×29): qty 30

## 2011-12-20 MED ORDER — CLOPIDOGREL BISULFATE 75 MG PO TABS
75.0000 mg | ORAL_TABLET | Freq: Every day | ORAL | Status: DC
  Administered 2011-12-20 – 2011-12-25 (×6): 75 mg via ORAL
  Filled 2011-12-20 (×8): qty 1

## 2011-12-20 NOTE — Progress Notes (Signed)
Stroke Team Progress Note  HISTORY  Brendan Scott is an 66 y.o. male who was last seen normal at 14:30 on 12/14/2101 when he left for work. Patient never showed up to work. Family then found him in driveway unresponsive and called EMS. EMS arrived to find patient standing in driveway slightly confused, left facial droop and dysarthria. Code stroke was called and patient was brought to [facility] hospital. Patient remained with left facial droop, right gaze preference, unable to cross midline, dysarthria and left hemianopsia. CT scan of the head without contrast showed evidence of a fairly large mass lesion involving the left frontotemporal region as well as possible increased density of the right middle cerebral artery proximally, and possibly early MCA territory infarction. Mass lesion on the left was thought to be incidental and likely a meningioma. As such, he was not considered a candidate for intravenous thrombolytic therapy with TPA. Patient was subsequently taken to Interventional Radiology for cerebral arteriogram was performed which showed no signs of occlusion of the right middle cerebral artery nor right internal carotid artery. There was mass effect involving the left middle cerebral artery with reduced diameter and straightening. Chronic permanent tumor blush was also seen. NIH stroke score was 8, including visual field defect and neglect of his left side as well as facial weakness and extremity weakness, and slurred speech. Modified Rankin score was 3. Patient was taking aspirin 81 mg per day prior to admission. He was admitted to the neuro ICU for further evaluation and treatment.  SUBJECTIVE  Patient lying in bed. Awake, slow to respond. No verbal complaints.    OBJECTIVE Most recent Vital Signs: Filed Vitals:   12/20/11 0500 12/20/11 0600 12/20/11 0700 12/20/11 0839  BP: 159/89 153/85 144/77   Pulse: 90 78 92   Temp:    98.5 F (36.9 C)  TempSrc:    Oral  Resp: 23 22 29    Height:        Weight: 96.3 kg (212 lb 4.9 oz)     SpO2: 97% 96% 97%     IV Fluid Intake:      . sodium chloride 75 mL/hr at 12/19/11 2342  . feeding supplement (JEVITY 1.2 CAL) 1,000 mL (12/19/11 2212)   MEDICATIONS     . antiseptic oral rinse  15 mL Mouth Rinse BID  . aspirin  300 mg Rectal Daily  . clonazePAM  0.5 mg Oral BID  . enoxaparin (LOVENOX) injection  40 mg Subcutaneous Q24H  . lacosamide (VIMPAT) IV  200 mg Intravenous Q12H  . levetiracetam  1,500 mg Intravenous Q12H  . lisinopril  5 mg Oral Daily  . LORazepam  2 mg Intravenous Once  . phenytoin (DILANTIN) IV  100 mg Intravenous Q8H  . senna-docusate  1 tablet Oral QHS  . simvastatin  20 mg Oral q1800  . [DISCONTINUED] clonazePAM  0.25 mg Oral BID   PRN:  fentaNYL, midazolam  Diet:  NPO  Activity:  Up with assistance to chair DVT Prophylaxis:  Lovenox 40 mg sq daily   CLINICALLY SIGNIFICANT STUDIES Basic Metabolic Panel:   Lab 12/18/11 0504 12/17/11 0448  NA 138 139  K 3.6 3.5  CL 103 103  CO2 24 26  GLUCOSE 128* 141*  BUN 10 7  CREATININE 1.08 1.00  CALCIUM 9.2 9.1  MG -- --  PHOS -- --   Liver Function Tests:   Lab 12/17/11 0448 12/15/11 1550  AST 20 26  ALT 22 36  ALKPHOS 65 76  BILITOT 0.8 0.3  PROT 6.7 7.5  ALBUMIN 3.5 4.1   CBC:   Lab 12/18/11 0504 12/17/11 0448 12/15/11 1550  WBC 10.7* 9.5 --  NEUTROABS -- 7.3 9.2*  HGB 14.4 13.9 --  HCT 42.6 42.9 --  MCV 80.7 81.7 --  PLT 188 184 --   Coagulation:   Lab 12/15/11 1550  LABPROT 12.9  INR 0.98   Cardiac Enzymes:   Lab 12/15/11 1550  CKTOTAL --  CKMB --  CKMBINDEX --  TROPONINI <0.30   Urinalysis: No results found for this basename: COLORURINE:2,APPERANCEUR:2,LABSPEC:2,PHURINE:2,GLUCOSEU:2,HGBUR:2,BILIRUBINUR:2,KETONESUR:2,PROTEINUR:2,UROBILINOGEN:2,NITRITE:2,LEUKOCYTESUR:2 in the last 168 hours Lipid Panel    Component Value Date/Time   CHOL 184 12/16/2011 0430   TRIG 51 12/16/2011 0430   HDL 60 12/16/2011 0430   CHOLHDL 3.1  12/16/2011 0430   VLDL 10 12/16/2011 0430   LDLCALC 114* 12/16/2011 0430   HgbA1C  Lab Results  Component Value Date   HGBA1C 6.9* 12/16/2011    Urine Drug Screen:   No results found for this basename: labopia,  cocainscrnur,  labbenz,  amphetmu,  thcu,  labbarb    Alcohol Level: No results found for this basename: ETH:2 in the last 168 hours  CT of the brain   12/16/2011  Exam is markedly motion degraded.  Findings raise possibility of progressive right hemispheric infarct.  Limited for detection of intracranial hemorrhage.  Left anterior left temporal lobe/ clinoid/cavernous sinus region hyperdense lesions suggestive of a meningioma.  This can be evaluated with contrast enhanced MR.  12/15/2011  Right middle cerebral artery slightly dense.  There is minimal hypodensity right opercular region. This may reflect changes of early acute infarct.  Large hyperdense mass centered at the left clinoid with extension into the left middle cranial fossa, left cavernous sinus, left subfrontal region and orbital apex region having appearance suggestive of a meningioma with bony remodeling.  This can be assessed with contrast enhanced MR.   Hypodensity adjacent left frontal lobe may reflect vasogenic edema.  No intracranial hemorrhage.  Prior right mastoidectomy.  Mild thickening along the peripheral aspect of the mastoidectomy site.  Partial opacification of residual right mastoid air cells.  12/20/2011  Evolving cytotoxic edema in the right frontotemporal region. Early hemorrhagic transformation right posterior frontal cortex. No significant mass effect. 3 mm right-to-left shift.  Cerebral Angiogram 12/14/2101 S/P 4 vessel cerebral arteriogram. RT CFA approach. 1. No occlusions ,stenosis dissections or intraluminal filling defects seen . 2. Venous outflow WNLs.  MRI of the brain  12/16/2011   Large right middle cerebral artery distribution non hemorrhagic infarct.  Large left sphenoid wing mass consistent  with meningioma with invasion    MRA of the brain  12/16/2011    Circumferential narrowing (caused by a meningioma) of the supraclinoid aspect of the left internal carotid artery, the carotid terminus, the A1 segment of the left anterior cerebral artery and slightly less so the M1 segment of the left middle cerebral artery (which is elevated by the mass).  Decreased caliber/number of visualized left middle cerebral artery branch vessels may indicate flow limiting stenosis by the meningioma.  Right internal carotid artery/carotid terminus without significant narrowing.  No significant narrowing of the M1 segment or A1 segment of the right middle cerebral artery/anterior cerebral artery respectively.  Right vertebral artery is dominant.  Moderate to marked tandem stenosis left vertebral artery.  High-grade stenosis proximal basilar artery.   2D Echocardiogram  EF 55-60% with no source of embolus. Patient in atrial fibrillation   Carotid Doppler  See cerebral angio  CXR  12/15/2011   Mild cardiac enlargement.  No active infiltrates or failure.   EKG  ATRIAL FIBRILLATION, V-RATE 68- 92 ~ var'd rate, irreg atrial activity  LATERAL INFARCT, OLD ~ Q>5mS, abnormal ST-T, I aVL V5-6  ABNRM R PROG, CONSIDER ASMI OR LEAD PLACEMENT ~ Q >56mS, diminished R, V2  Therapy Recommendations PT - CIR; OT - CIR  Physical Exam   Patient is more awake and is cooperative at the time of examination.  Continuous twitching of the left face and left arm is noted.  Speech is dysarthric. Language is fluent and comprehension intact. Respiratory examination is clear.  Cardiovascular examination reveals an occasional irregular heart rhythm, no obvious murmurs or rubs are noted.  No carotid bruits are noted.  Abdomen is obese, soft and nontender, positive bowel sounds.  Extremities are without significant edema  The patient has clear left facial droop, some conjugate eye deviation to the right. The patient has a dense left  homonymous visual field deficit.  The patient has a left hemiparesis 3/5, left arm greater than left leg. Right side appears to have normal strength.  No obvious ataxia seen with the extremities.  The patient could not be ambulated.  Deep tendon reflexes are symmetric.   ASSESSMENT Mr. Brendan Scott is a 66 y.o. male presenting with confusion, left facial droop and dysarthria. MRI confirms a large right middle cerebral artery distribution non hemorrhagic infarct. 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  Newly identified left frontal and anterior temporal meningioma. Infarct likely embolic infarct given atrial fibrillation. On aspirin 81 mg orally every day prior to admission. Now on aspirin 300 mg rectally every day for secondary stroke prevention.   Atrial fibrillation Diabetes, HgbA1c 6.9 Hyperlipidemia, LDL 114, not on statin PTA, goal LDL < 100. On fish oil Obesity class 2, Body mass index is 34.27 kg/(m^2). Focal status epilepticus with ongoing simple partial seizures involving left side of his face, EEG without epileptiform discharges, on dilantin, vimpat, keppra, and klonopin Dysphagia secondary to stroke/seizure Hypertension Cytotoxic edema  Hospital day # 5  TREATMENT/PLAN  Change to plavix 75mg  plus baby aspirin 81mg  daily per NG tube. Once able to take po, change to coumadin due to new afib  ST following swallow  Increase Vimpat assist with seizure control   Place NG and start tube feedings.   Rehab candidate at time of discharge   Discussed with wife and family and answered questions. This patient is critically ill and at significant risk of neurological worsening, death and care requires constant monitoring of vital signs, hemodynamics,respiratory and cardiac monitoring,review of multiple databases, neurological assessment, discussion with family, other specialists and medical  decision making of high complexity. I spent 30 minutes of neurocritical care time  in the care of  this patient.  Guy Franco, PAC,  MBA, MHA [facility] Pager: [phone] 12/20/2011 8:45 AM  Scribe for Dr. Delia Heady, Stroke Center Medical Director. He has personally reviewed chart, pertinent data, examined the patient and developed the plan of care. Pager:  [phone]

## 2011-12-20 NOTE — Progress Notes (Signed)
INITIAL ADULT NUTRITION ASSESSMENT Date: 12/20/2011   Time: 9:13 AM Reason for Assessment: Consult received to initiate and manage enteral nutrition support.  INTERVENTION: 1. Initiate Jevity 1.2 at 20 ml/hr and increase by 10 ml every 4 hours to goal rate of 70 ml/hr.  At goal rate, tube feeding regimen will provide 2016 kcal, 93 grams of protein, and 1360 ml of H2O.  2. Recommend free water: 170 ml H2O every 6 hours   DOCUMENTATION CODES Per approved criteria  -Obesity Unspecified   ASSESSMENT: Male 66 y.o.  Dx: Stroke  Hx:  Past Medical History  Diagnosis Date  . Arthritis   . Diabetes mellitus   . Atrial fibrillation    Past Surgical History  Procedure Date  . Total knee arthroplasty     Bilateral   Related Meds:  Scheduled Meds:   . antiseptic oral rinse  15 mL Mouth Rinse BID  . aspirin  300 mg Rectal Daily  . clonazePAM  0.5 mg Oral BID  . enoxaparin (LOVENOX) injection  40 mg Subcutaneous Q24H  . lacosamide (VIMPAT) IV  300 mg Intravenous Q12H  . levetiracetam  1,500 mg Intravenous Q12H  . lisinopril  5 mg Oral Daily  . LORazepam  2 mg Intravenous Once  . phenytoin (DILANTIN) IV  100 mg Intravenous Q8H  . senna-docusate  1 tablet Oral QHS  . simvastatin  20 mg Oral q1800  . [DISCONTINUED] clonazePAM  0.25 mg Oral BID  . [DISCONTINUED] lacosamide (VIMPAT) IV  200 mg Intravenous Q12H   Continuous Infusions:   . sodium chloride 75 mL/hr at 12/19/11 2342  . feeding supplement (JEVITY 1.2 CAL) 1,000 mL (12/19/11 2212)   PRN Meds:.fentaNYL, midazolam  Ht: 5\' 6"  (167.6 cm)  Wt: 212 lb 4.9 oz (96.3 kg)  Ideal Wt: 64.5 kg % Ideal Wt: 149%  Usual Wt:  Wt Readings from Last 10 Encounters:  12/20/11 212 lb 4.9 oz (96.3 kg)  10/18/10 213 lb (96.616 kg)   % Usual Wt: 100%  Body mass index is 34.27 kg/(m^2). Obesity Class I  Food/Nutrition Related Hx: Per wife pt with no recent weight changes and good appetite PTA.   Labs:  CMP     Component Value  Date/Time   NA 138 12/18/2011 0504   K 3.6 12/18/2011 0504   CL 103 12/18/2011 0504   CO2 24 12/18/2011 0504   GLUCOSE 128* 12/18/2011 0504   BUN 10 12/18/2011 0504   CREATININE 1.08 12/18/2011 0504   CALCIUM 9.2 12/18/2011 0504   PROT 6.7 12/17/2011 0448   ALBUMIN 3.5 12/17/2011 0448   AST 20 12/17/2011 0448   ALT 22 12/17/2011 0448   ALKPHOS 65 12/17/2011 0448   BILITOT 0.8 12/17/2011 0448   GFRNONAA 70* 12/18/2011 0504   GFRAA 81* 12/18/2011 0504  CBG (last 3)  No results found for this basename: GLUCAP:3 in the last 72 hours Lab Results  Component Value Date   HGBA1C 6.9* 12/16/2011   Lipid Panel     Component Value Date/Time   CHOL 184 12/16/2011 0430   TRIG 51 12/16/2011 0430   HDL 60 12/16/2011 0430   CHOLHDL 3.1 12/16/2011 0430   VLDL 10 12/16/2011 0430   LDLCALC 114* 12/16/2011 0430    Intake/Output Summary (Last 24 hours) at 12/20/11 0916 Last data filed at 12/20/11 0700  Gross per 24 hour  Intake   2260 ml  Output   1435 ml  Net    825 ml    Last  BM: 12/16/11  Diet Order: NPO  Supplements/Tube Feeding: Jevity 1.2, held with NGT out  IVF:    sodium chloride Last Rate: 75 mL/hr at 12/19/11 2342  feeding supplement (JEVITY 1.2 CAL) Last Rate: 1,000 mL (12/19/11 2212)   Pt admitted with right MCA infarct. Pt with gaze preference to the right, left visual field defect and left hemiparesis. Pt with a left frontotemporal mass lesion, likely a meningioma per MD.  Pt followed by SLP and failed swallow eval, TF protocol initiated. Per RN pt's NGT came out last night, it was replaced and TF was advanced to goal rate. This morning at about 10 am NGT could not be flushed and TF was stopped. Per RN he will recheck and replace tube if needed and resume TF.    Estimated Nutritional Needs:   Kcal:  1950-2150 Protein:  90-105 grams Fluid:  >1.9 L/day  NUTRITION DIAGNOSIS: Inadequate oral intake r/t inability to eat AEB NPO  status.  MONITORING/EVALUATION(Goals): Goal: Pt to meet >/= 90% of their estimated nutrition needs.  Monitor: TF tolerance, swallow ability  EDUCATION NEEDS: -No education needs identified at this time  Kendell Bane RD, LDN, CNSC [phone] Pager [phone] After Hours Pager  12/20/2011, 9:13 AM

## 2011-12-20 NOTE — Progress Notes (Signed)
UR completed 

## 2011-12-20 NOTE — Progress Notes (Signed)
Pt is possible for CIR once medically stable, &, if insurance approves [note that pt's State [employer] is primary].  Will follow. [phone]

## 2011-12-20 NOTE — Progress Notes (Signed)
Physical Therapy Treatment Patient Details Name: Brendan Scott MRN: [id number] DOB: [birth year] Today's Date: 12/20/2011 Time: 1308-6578 PT Time Calculation (min): 31 min  PT Assessment / Plan / Recommendation Comments on Treatment Session  Pt lethargic today with minimal verbalization throughout session. Pt with increased tone left side but also demonstrates generalized weakness of the right side with mobility.  PT will continue to follow.    Follow Up Recommendations  CIR;Supervision/Assistance - 24 hour     Does the patient have the potential to tolerate intense rehabilitation     Barriers to Discharge        Equipment Recommendations  3 in 1 bedside comode    Recommendations for Other Services Rehab consult  Frequency Min 4X/week   Plan Frequency remains appropriate;Discharge plan needs to be updated    Precautions / Restrictions Precautions Precautions: Fall Restrictions Weight Bearing Restrictions: No   Pertinent Vitals/Pain BP elevated 169/124, RN aware    Mobility  Bed Mobility Bed Mobility: Supine to Sit Supine to Sit: 1: +2 Total assist;With rails Supine to Sit: Patient Percentage: 30% Sitting - Scoot to Edge of Bed: 1: +1 Total assist Details for Bed Mobility Assistance: pt initiated supine to sit but needed +2 tot A to get legs off bed and get to side, assist needed to elevate trunk Transfers Transfers: Sit to Stand;Stand to Sit;Stand Pivot Transfers Sit to Stand: From bed;From elevated surface;1: +2 Total assist Sit to Stand: Patient Percentage: 60% Stand to Sit: To chair/3-in-1;1: +2 Total assist;With upper extremity assist Stand to Sit: Patient Percentage: 60% Stand Pivot Transfers: 1: +2 Total assist Stand Pivot Transfers: Patient Percentage: 50% Details for Transfer Assistance: pt began stepping feet without cues but then stopped wt-shifting to the left to lift right foot, max assist to wt-shift, max assist at hips to move towards recliner, did not  respond to vc's only, tactile cue given at right foot which elicited a step Ambulation/Gait Ambulation/Gait Assistance: Not tested (comment) (increased BP today) Stairs: No Wheelchair Mobility Wheelchair Mobility: No Modified Rankin (Stroke Patients Only) Pre-Morbid Rankin Score: No symptoms Modified Rankin: Severe disability    Exercises     PT Diagnosis:    PT Problem List:   PT Treatment Interventions:     PT Goals Acute Rehab PT Goals PT Goal Formulation: With patient Time For Goal Achievement: 12/30/11 Potential to Achieve Goals: Fair Pt will Roll Supine to Right Side: with modified independence PT Goal: Rolling Supine to Right Side - Progress: Progressing toward goal Pt will Roll Supine to Left Side: with modified independence PT Goal: Rolling Supine to Left Side - Progress: Progressing toward goal Pt will go Supine/Side to Sit: with supervision PT Goal: Supine/Side to Sit - Progress: Progressing toward goal Pt will go Sit to Supine/Side: with supervision PT Goal: Sit to Supine/Side - Progress: Progressing toward goal Pt will go Sit to Stand: with min assist PT Goal: Sit to Stand - Progress: Progressing toward goal Pt will go Stand to Sit: with min assist PT Goal: Stand to Sit - Progress: Progressing toward goal Pt will Transfer Bed to Chair/Chair to Bed: with mod assist PT Transfer Goal: Bed to Chair/Chair to Bed - Progress: Progressing toward goal Pt will Ambulate: 16 - 50 feet;with mod assist;with least restrictive assistive device PT Goal: Ambulate - Progress: Not progressing  Visit Information  Last PT Received On: 12/20/11 Assistance Needed: +2    Subjective Data  Subjective: pt with minimal but appropriate verbalization Patient Stated Goal: to get  better   Cognition  Overall Cognitive Status: Impaired Area of Impairment: Attention;Safety/judgement;Problem solving Difficult to assess due to: Level of arousal Arousal/Alertness: Lethargic Orientation  Level: Time;Disoriented to Behavior During Session: Lethargic Current Attention Level: Sustained Safety/Judgement - Other Comments: decreased awareness of left sided weakness Problem Solving: Pt slow to process and needs extra time to complete task. Cognition - Other Comments: responds to one step commands inconsistently    Balance  Balance Balance Assessed: Yes Static Sitting Balance Static Sitting - Balance Support: Feet supported;Bilateral upper extremity supported Static Sitting - Level of Assistance: 5: Stand by assistance Static Standing Balance Static Standing - Balance Support: Bilateral upper extremity supported;During functional activity Static Standing - Level of Assistance: 1: +2 Total assist Static Standing - Comment/# of Minutes: pt unable to maintain standing today for more than transfer, very lethargic, drooling throughout session, max verbal cues for upright posture with standing  End of Session PT - End of Session Equipment Utilized During Treatment: Gait belt Activity Tolerance: Patient limited by fatigue;Treatment limited secondary to medical complications (Comment) (elevated BP) Patient left: in chair;with call bell/phone within reach Nurse Communication: Mobility status;Other (comment) (elevated BP)   GP   Brendan Scott, PT  Acute Rehab Services  [phone]   Brendan Scott 12/20/2011, 4:28 PM

## 2011-12-20 NOTE — Progress Notes (Signed)
Speech Language Pathology Dysphagia Treatment Patient Details Name: Brendan Scott MRN: [id number] DOB: 08/20/1945 Today's Date: 12/20/2011 Time: 1430-1440 SLP Time Calculation (min): 10 min  Assessment / Plan / Recommendation Clinical Impression  Treatment focused on differential diagnosis of swallowing abilitites to determine potential to resume a po diet. Patient continues to present with facial twitching, however, improved from yesterday. Pt is currently exhibiting difficulty managing secretions effectively, with anterior oral leakage, and wet voice quality. Pt is able to follow most commands, but volitional throat clear/cough is generally weak and poorly effective. NG now replaced after pt accidentally pulled it out yesterday.  SLP to continue to follow at bedside, to assess improvement and po readiness.  Pt Brendan likely benefit from objective study prior to initiation of po intake.      Diet Recommendation  Continue with Current Diet: NPO    SLP Plan Continue with current plan of care   Pertinent Vitals/Pain None reported   Swallowing Goals  SLP Swallowing Goals Goal #3: Patient Brendan present with absence of facial twitching/seizure activity in order to orally transit clinician provided po trials at bedside with min assist for use of compensatory strategies.  Swallow Study Goal #3 - Progress: Progressing toward goal (twitching decreased today.)  General Temperature Spikes Noted: No Respiratory Status: Room air Behavior/Cognition: Cooperative;Lethargic;Decreased sustained attention;Requires cueing (Follows directions fairly well) Patient Positioning:  (Seated at EOB with PT/OT)  Oral Cavity - Oral Hygiene Does patient have any of the following "at risk" factors?: Other - dysphagia Brush patient's teeth BID with toothbrush (using toothpaste with fluoride): Yes Patient is HIGH RISK - Oral Care Protocol followed (see row info): Yes Patient is AT RISK - Oral Care Protocol followed (see  row info): Yes   Dysphagia Treatment Treatment focused on:  (skilled obs of alertness, secretion management, po readiness) Treatment Methods/Modalities: Skilled observation;Differential diagnosis Patient observed directly with PO's: No Reason PO's not observed: Lethargic;Other (comment) (Poor oropharyngeal secretion management)    Aubrionna Istre B. Murvin [facility], CCC-SLP [phone]   Leigh Aurora 12/20/2011, 2:56 PM

## 2011-12-21 ENCOUNTER — Inpatient Hospital Stay (HOSPITAL_COMMUNITY): Payer: BC Managed Care – PPO

## 2011-12-21 DIAGNOSIS — I63419 Cerebral infarction due to embolism of unspecified middle cerebral artery: Secondary | ICD-10-CM | POA: Diagnosis present

## 2011-12-21 LAB — URINALYSIS, ROUTINE W REFLEX MICROSCOPIC
Bilirubin Urine: NEGATIVE
Glucose, UA: NEGATIVE mg/dL
Hgb urine dipstick: NEGATIVE
Ketones, ur: NEGATIVE mg/dL
Protein, ur: NEGATIVE mg/dL
pH: 8 (ref 5.0–8.0)

## 2011-12-21 LAB — URINE MICROSCOPIC-ADD ON

## 2011-12-21 LAB — GLUCOSE, CAPILLARY: Glucose-Capillary: 176 mg/dL — ABNORMAL HIGH (ref 70–99)

## 2011-12-21 MED ORDER — INSULIN ASPART 100 UNIT/ML ~~LOC~~ SOLN: 0.0000 [IU] | Freq: Every day | SUBCUTANEOUS | Status: DC

## 2011-12-21 MED ORDER — ASPIRIN 81 MG PO CHEW
81.0000 mg | CHEWABLE_TABLET | Freq: Every day | ORAL | Status: DC
  Administered 2011-12-22 – 2011-12-25 (×4): 81 mg via ORAL
  Filled 2011-12-21 (×4): qty 1

## 2011-12-21 MED ORDER — INSULIN ASPART 100 UNIT/ML ~~LOC~~ SOLN: 0.0000 [IU] | Freq: Three times a day (TID) | SUBCUTANEOUS | Status: DC

## 2011-12-21 MED ORDER — SODIUM CHLORIDE 0.9 % IV SOLN
2000.0000 mg | Freq: Two times a day (BID) | INTRAVENOUS | Status: DC
  Administered 2011-12-21 – 2011-12-25 (×8): 2000 mg via INTRAVENOUS
  Filled 2011-12-21 (×11): qty 20

## 2011-12-21 NOTE — Progress Notes (Signed)
Repeat eeg completed

## 2011-12-21 NOTE — Progress Notes (Signed)
Pt arrived from 3100, oriented Pt to unit.

## 2011-12-21 NOTE — Progress Notes (Signed)
Stroke Team Progress Note  HISTORY  Brendan Scott is an 66 y.o. male who was last seen normal at 14:30 on 12/14/2101 when he left for work. Patient never showed up to work. Family then found him in driveway unresponsive and called EMS. EMS arrived to find patient standing in driveway slightly confused, left facial droop and dysarthria. Code stroke was called and patient was brought to [facility]. Patient remained with left facial droop, right gaze preference, unable to cross midline, dysarthria and left hemianopsia. CT scan of the head without contrast showed evidence of a fairly large mass lesion involving the left frontotemporal region as well as possible increased density of the right middle cerebral artery proximally, and possibly early MCA territory infarction. Mass lesion on the left was thought to be incidental and likely a meningioma. As such, he was not considered a candidate for intravenous thrombolytic therapy with TPA. Patient was subsequently taken to Interventional Radiology for cerebral arteriogram was performed which showed no signs of occlusion of the right middle cerebral artery nor right internal carotid artery. There was mass effect involving the left middle cerebral artery with reduced diameter and straightening. Chronic permanent tumor blush was also seen. NIH stroke score was 8, including visual field defect and neglect of his left side as well as facial weakness and extremity weakness, and slurred speech. Modified Rankin score was 3. Patient was taking aspirin 81 mg per day prior to admission. He was admitted to the neuro ICU for further evaluation and treatment.  SUBJECTIVE  Family at bedside. Patient unchanged. Lying in bed.  Disability paperwork filled out for patient.    OBJECTIVE Most recent Vital Signs: Filed Vitals:   12/21/11 0300 12/21/11 0400 12/21/11 0500 12/21/11 0600  BP: 138/65 128/70 125/76 139/70  Pulse: 76 82 74 73  Temp:  97.9 F (36.6 C)    TempSrc:   Axillary    Resp: 29 28 26 28   Height:      Weight:   98.5 kg (217 lb 2.5 oz)   SpO2: 96% 96% 99% 99%    IV Fluid Intake:      . sodium chloride 75 mL/hr at 12/20/11 1600  . feeding supplement (JEVITY 1.2 CAL) 1,000 mL (12/20/11 1407)  . [DISCONTINUED] feeding supplement (JEVITY 1.2 CAL) 1,000 mL (12/19/11 2212)   MEDICATIONS     . antiseptic oral rinse  15 mL Mouth Rinse BID  . aspirin EC  81 mg Oral Daily  . clonazePAM  0.5 mg Oral BID  . clopidogrel  75 mg Oral Q breakfast  . enoxaparin (LOVENOX) injection  40 mg Subcutaneous Q24H  . lacosamide (VIMPAT) IV  300 mg Intravenous Q12H  . levetiracetam  1,500 mg Intravenous Q12H  . lisinopril  5 mg Oral Daily  . LORazepam  2 mg Intravenous Once  . phenytoin (DILANTIN) IV  100 mg Intravenous Q8H  . senna-docusate  1 tablet Oral QHS  . simvastatin  20 mg Oral q1800  . [DISCONTINUED] aspirin  300 mg Rectal Daily  . [DISCONTINUED] lacosamide (VIMPAT) IV  200 mg Intravenous Q12H   PRN:  fentaNYL, labetalol, midazolam  Diet:  NPO --failed swallow study Activity:  Up with assistance to chair DVT Prophylaxis:  Lovenox 40 mg sq daily   CLINICALLY SIGNIFICANT STUDIES Basic Metabolic Panel:   Lab 12/18/11 0504 12/17/11 0448  NA 138 139  K 3.6 3.5  CL 103 103  CO2 24 26  GLUCOSE 128* 141*  BUN 10 7  CREATININE  1.08 1.00  CALCIUM 9.2 9.1  MG -- --  PHOS -- --   Liver Function Tests:   Lab 12/17/11 0448 12/15/11 1550  AST 20 26  ALT 22 36  ALKPHOS 65 76  BILITOT 0.8 0.3  PROT 6.7 7.5  ALBUMIN 3.5 4.1   CBC:   Lab 12/18/11 0504 12/17/11 0448 12/15/11 1550  WBC 10.7* 9.5 --  NEUTROABS -- 7.3 9.2*  HGB 14.4 13.9 --  HCT 42.6 42.9 --  MCV 80.7 81.7 --  PLT 188 184 --   Coagulation:   Lab 12/15/11 1550  LABPROT 12.9  INR 0.98   Cardiac Enzymes:   Lab 12/15/11 1550  CKTOTAL --  CKMB --  CKMBINDEX --  TROPONINI <0.30   Urinalysis: No results found for this basename:  COLORURINE:2,APPERANCEUR:2,LABSPEC:2,PHURINE:2,GLUCOSEU:2,HGBUR:2,BILIRUBINUR:2,KETONESUR:2,PROTEINUR:2,UROBILINOGEN:2,NITRITE:2,LEUKOCYTESUR:2 in the last 168 hours Lipid Panel    Component Value Date/Time   CHOL 184 12/16/2011 0430   TRIG 51 12/16/2011 0430   HDL 60 12/16/2011 0430   CHOLHDL 3.1 12/16/2011 0430   VLDL 10 12/16/2011 0430   LDLCALC 114* 12/16/2011 0430   HgbA1C  Lab Results  Component Value Date   HGBA1C 6.9* 12/16/2011    Urine Drug Screen:   No results found for this basename: labopia,  cocainscrnur,  labbenz,  amphetmu,  thcu,  labbarb    Alcohol Level: No results found for this basename: ETH:2 in the last 168 hours  CT of the brain   12/16/2011  Exam is markedly motion degraded.  Findings raise possibility of progressive right hemispheric infarct.  Limited for detection of intracranial hemorrhage.  Left anterior left temporal lobe/ clinoid/cavernous sinus region hyperdense lesions suggestive of a meningioma.  This can be evaluated with contrast enhanced MR.  12/15/2011  Right middle cerebral artery slightly dense.  There is minimal hypodensity right opercular region. This may reflect changes of early acute infarct.  Large hyperdense mass centered at the left clinoid with extension into the left middle cranial fossa, left cavernous sinus, left subfrontal region and orbital apex region having appearance suggestive of a meningioma with bony remodeling.  This can be assessed with contrast enhanced MR.   Hypodensity adjacent left frontal lobe may reflect vasogenic edema.  No intracranial hemorrhage.  Prior right mastoidectomy.  Mild thickening along the peripheral aspect of the mastoidectomy site.  Partial opacification of residual right mastoid air cells.  12/20/2011  Evolving cytotoxic edema in the right frontotemporal region. Early hemorrhagic transformation right posterior frontal cortex. No significant mass effect. 3 mm right-to-left shift.  Cerebral Angiogram  12/14/2101 S/P 4 vessel cerebral arteriogram. RT CFA approach. 1. No occlusions ,stenosis dissections or intraluminal filling defects seen . 2. Venous outflow WNLs.  MRI of the brain  12/16/2011   Large right middle cerebral artery distribution non hemorrhagic infarct.  Large left sphenoid wing mass consistent with meningioma with invasion    MRA of the brain  12/16/2011    Circumferential narrowing (caused by a meningioma) of the supraclinoid aspect of the left internal carotid artery, the carotid terminus, the A1 segment of the left anterior cerebral artery and slightly less so the M1 segment of the left middle cerebral artery (which is elevated by the mass).  Decreased caliber/number of visualized left middle cerebral artery branch vessels may indicate flow limiting stenosis by the meningioma.  Right internal carotid artery/carotid terminus without significant narrowing.  No significant narrowing of the M1 segment or A1 segment of the right middle cerebral artery/anterior cerebral artery respectively.  Right vertebral  artery is dominant.  Moderate to marked tandem stenosis left vertebral artery.  High-grade stenosis proximal basilar artery.   2D Echocardiogram  EF 55-60% with no source of embolus. Patient in atrial fibrillation   Carotid Doppler  See cerebral angio  EEG 11/17/2013This is a normal EEG recording during wakefulness. No evidence of an epileptic disorder was demonstrated. Rhythmic muscle activity consistent with tremor was seen frequently throughout the  record, as described above 12/21/2011-pending   CXR  12/15/2011   Mild cardiac enlargement.  No active infiltrates or failure.   EKG  ATRIAL FIBRILLATION, V-RATE 68- 92 ~ var'd rate, irreg atrial activity  LATERAL INFARCT, OLD ~ Q>45mS, abnormal ST-T, I aVL V5-6  ABNRM R PROG, CONSIDER ASMI OR LEAD PLACEMENT ~ Q >3mS, diminished R, V2  Therapy Recommendations PT - CIR; OT - CIR  Physical Exam   Patient is more awake and is  cooperative at the time of examination.  Continuous twitching of the left face and left arm is noted.  Speech is dysarthric. Language is fluent and comprehension intact. Respiratory examination is clear.  Cardiovascular examination reveals an occasional irregular heart rhythm, no obvious murmurs or rubs are noted.  No carotid bruits are noted.  Abdomen is obese, soft and nontender, positive bowel sounds.  Extremities are without significant edema  The patient has clear left facial droop, some conjugate eye deviation to the right. The patient has a dense left homonymous visual field deficit.  The patient has a left hemiparesis 3/5, left arm greater than left leg. Right side appears to have normal strength.  No obvious ataxia seen with the extremities.  The patient could not be ambulated.  Deep tendon reflexes are symmetric.   ASSESSMENT Mr. IBRAHEM VOLKMAN is a 66 y.o. male presenting with confusion, left facial droop and dysarthria. MRI confirms a large right middle cerebral artery distribution non hemorrhagic infarct. 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  Newly identified left frontal and anterior temporal meningioma. Infarct likely embolic infarct given atrial fibrillation. On aspirin 81 mg orally every day prior to admission. Now on aspirin 300 mg rectally every day for secondary stroke prevention.   Atrial fibrillation Diabetes, HgbA1c 6.9 Hyperlipidemia, LDL 114, not on statin PTA, goal LDL < 70 for diabetics. On fish oil PTA. Obesity class 2, Body mass index is 35.05 kg/(m^2). Focal status epilepticus with ongoing simple partial seizures involving left side of his face, EEG without epileptiform discharges, on dilantin, vimpat, keppra, and klonopin Dysphagia secondary to stroke/seizure Hypertension Cytotoxic edema EEG  Hospital day # 6  TREATMENT/PLAN  Change to plavix 75mg  plus baby  aspirin 81mg  daily per NG tube. Once able to take po, change to coumadin due to new afib  Place NG and start tube feedings. Failed swallow study.  HGB A1C goal < 6.5  Hyperlipidemia, zocor started.  EEG-pending  Urinalysis, cloudy urine.  Rehab candidate at time of discharge   Transfer out of unit  Discussed with wife and family and answered questions.   This patient is critically ill and at significant risk of neurological worsening, death and care requires constant monitoring of vital signs, hemodynamics,respiratory and cardiac monitoring,review of multiple databases, neurological assessment, discussion with family, other specialists and medical decision making of high complexity. I spent 30 minutes of neurocritical care time  in the care of  this patient.   Guy Franco, PAC,  MBA, MHA [facility]  Pager: [phone] 12/21/2011 8:38 AM  Dr. Delia Heady, Stroke Center Medical Director has personally reviewed chart, pertinent data, examined the patient and developed the plan of care. Pager:  [phone]

## 2011-12-21 NOTE — Progress Notes (Signed)
SLP Cancellation Note  Patient Details Name: Brendan Scott MRN: [id number] DOB: Aug 30, 1945   Cancelled treatment:       Reason Eval/Treat Not Completed: Fatigue/lethargy limiting ability to participate (RN aware. )  Ferdinand Lango MA, CCC-SLP [phone]  Ferdinand Lango [name] 12/21/2011, 2:43 PM

## 2011-12-21 NOTE — Progress Notes (Signed)
Occupational Therapy Treatment Patient Details Name: TRAVER MECKES MRN: [id number] DOB: [birth year] Today's Date: 12/21/2011 Time: 7829-5621 OT Time Calculation (min): 53 min  OT Assessment / Plan / Recommendation Comments on Treatment Session Pt progressing with therapy but continues to be limited by decreased arousal. Pt continues to exhibit visual deficits, but unable to assess secondary to pt's arousal    Follow Up Recommendations  CIR;Supervision/Assistance - 24 hour    Barriers to Discharge       Equipment Recommendations  3 in 1 bedside comode    Recommendations for Other Services Rehab consult  Frequency     Plan Discharge plan remains appropriate    Precautions / Restrictions Precautions Precautions: Fall Restrictions Weight Bearing Restrictions: No   Pertinent Vitals/Pain Pt with no indications of pain    ADL  Grooming: Wash/dry face;Moderate assistance Where Assessed - Grooming: Supported sitting Upper Body Dressing: Maximal assistance Where Assessed - Upper Body Dressing: Supported sitting Lower Body Dressing: +1 Total assistance (don socks) Where Assessed - Lower Body Dressing: Supported sitting Toilet Transfer: +2 Total assistance Toilet Transfer: Patient Percentage: 60% Statistician Method: Surveyor, minerals:  (bed to chair) Toileting - Clothing Manipulation and Hygiene: +1 Total assistance Where Assessed - Toileting Clothing Manipulation and Hygiene: Standing Equipment Used: Gait belt Transfers/Ambulation Related to ADLs: +2total A(pt=60%) for sit to stand and stand pivot from bed to chair. VC and assist weight shift and advance bilateral feet. Pt able to move feet slightly more independently today    OT Diagnosis:    OT Problem List:   OT Treatment Interventions:     OT Goals ADL Goals Pt Will Perform Grooming: with min assist;Supported;Sitting, edge of bed;Sitting at sink ADL Goal: Grooming - Progress: Progressing toward  goals Pt Will Perform Upper Body Bathing: with min assist;Sitting, chair;Sitting, edge of bed;Supported Pt Will Perform Lower Body Bathing: with min assist;Sitting, chair;Sitting, edge of bed;Supported Pt Will Transfer to Toilet: with mod assist;Ambulation;with DME;Comfort height toilet ADL Goal: Toilet Transfer - Progress: Progressing toward goals Miscellaneous OT Goals Miscellaneous OT Goal #1: Pt will perform bed mobility with mod assist in prep for EOB ADLs. OT Goal: Miscellaneous Goal #1 - Progress: Progressing toward goals Miscellaneous OT Goal #2: Pt will tolerate >15 of therapy in order to increase activity tolerance as precursor for ADLs. OT Goal: Miscellaneous Goal #2 - Progress: Progressing toward goals Miscellaneous OT Goal #3: Pt will set EOB with min assist >5 min as precursor for ADLs. OT Goal: Miscellaneous Goal #3 - Progress: Met  Visit Information  Last OT Received On: 12/21/11 Assistance Needed: +2 PT/OT Co-Evaluation/Treatment: Yes    Subjective Data      Prior Functioning       Cognition  Overall Cognitive Status: Impaired Area of Impairment: Attention;Safety/judgement;Problem solving Difficult to assess due to: Level of arousal Arousal/Alertness: Lethargic Orientation Level: Time;Disoriented to Behavior During Session: Lethargic Current Attention Level: Sustained Safety/Judgement - Other Comments: decreased awareness of left sided weakness Problem Solving: Pt slow to process and needs extra time to complete task. Cognition - Other Comments: responds to one step commands inconsistently    Mobility  Shoulder Instructions Bed Mobility Bed Mobility: Supine to Sit Supine to Sit: 1: +2 Total assist;With rails Supine to Sit: Patient Percentage: 30% Sitting - Scoot to Edge of Bed: 1: +1 Total assist Details for Bed Mobility Assistance: pt initiated supine to sit but needed +2 tot A to get legs off bed and get to side, assist needed to  elevate trunk         Exercises      Balance     End of Session OT - End of Session Equipment Utilized During Treatment: Gait belt Activity Tolerance:  (limited by decreased alertness) Patient left: with call bell/phone within reach;in chair Nurse Communication: Mobility status  GO     Ruhama Lehew 12/21/2011, 7:56 AM

## 2011-12-21 NOTE — Progress Notes (Signed)
Neurology MD notified of seizure activity in arm and face. No new orders at this time.

## 2011-12-22 ENCOUNTER — Inpatient Hospital Stay (HOSPITAL_COMMUNITY): Payer: BC Managed Care – PPO

## 2011-12-22 LAB — GLUCOSE, CAPILLARY
Glucose-Capillary: 205 mg/dL — ABNORMAL HIGH (ref 70–99)
Glucose-Capillary: 162 mg/dL — ABNORMAL HIGH (ref 70–99)

## 2011-12-22 MED ORDER — INSULIN ASPART 100 UNIT/ML ~~LOC~~ SOLN
0.0000 [IU] | SUBCUTANEOUS | Status: DC
  Administered 2011-12-22: 2 [IU] via SUBCUTANEOUS
  Administered 2011-12-22: 3 [IU] via SUBCUTANEOUS
  Administered 2011-12-22: 2 [IU] via SUBCUTANEOUS
  Administered 2011-12-22: 3 [IU] via SUBCUTANEOUS
  Administered 2011-12-22 (×2): 2 [IU] via SUBCUTANEOUS
  Administered 2011-12-23: 3 [IU] via SUBCUTANEOUS
  Administered 2011-12-23 (×2): 2 [IU] via SUBCUTANEOUS
  Administered 2011-12-23: 3 [IU] via SUBCUTANEOUS
  Administered 2011-12-23: 2 [IU] via SUBCUTANEOUS
  Administered 2011-12-24: 5 [IU] via SUBCUTANEOUS
  Administered 2011-12-24 (×4): 3 [IU] via SUBCUTANEOUS
  Administered 2011-12-25: 1 [IU] via SUBCUTANEOUS
  Administered 2011-12-25 (×4): 3 [IU] via SUBCUTANEOUS
  Administered 2011-12-25: 2 [IU] via SUBCUTANEOUS
  Administered 2011-12-26: 1 [IU] via SUBCUTANEOUS
  Administered 2011-12-26: 3 [IU] via SUBCUTANEOUS
  Administered 2011-12-26 (×2): 1 [IU] via SUBCUTANEOUS

## 2011-12-22 NOTE — Progress Notes (Signed)
Physical Therapy Treatment Patient Details Name: Brendan Scott MRN: [id number] DOB: 01-Jun-1945 Today's Date: 12/22/2011 Time: 9147-8295 PT Time Calculation (min): 16 min  PT Assessment / Plan / Recommendation Comments on Treatment Session  Pt with increase lethargy today with minimal verbalization.  Pt unable to improve mobility today and needs increase assistance with transfers.  Pt difficulty to keep eyes open and not assisting with transfers.  Limited session due to portable x-ray present to see pt.    Follow Up Recommendations  CIR;Supervision/Assistance - 24 hour     Does the patient have the potential to tolerate intense rehabilitation     Barriers to Discharge        Equipment Recommendations  3 in 1 bedside comode    Recommendations for Other Services    Frequency Min 4X/week   Plan Frequency remains appropriate;Discharge plan needs to be updated    Precautions / Restrictions Precautions Precautions: Fall Restrictions Weight Bearing Restrictions: No   Pertinent Vitals/Pain Pt unable to rate    Mobility  Bed Mobility Bed Mobility: Supine to Sit Supine to Sit: 2: Max assist Sitting - Scoot to Edge of Bed: 2: Max assist Details for Bed Mobility Assistance: (A) to elevate trunk OOB.  Pt able to activate LE OOB with manual cues. Transfers Transfers: Sit to Stand;Stand to Sit;Stand Pivot Transfers Sit to Stand: 1: +2 Total assist;From bed Sit to Stand: Patient Percentage: 20% Stand to Sit: 1: +2 Total assist;To chair/3-in-1 Stand to Sit: Patient Percentage: 20% Stand Pivot Transfers: 1: +2 Total assist Stand Pivot Transfers: Patient Percentage: 10% Details for Transfer Assistance: Pt needed increase assistance with transfer and unable to stand upright.  Very limited due to overall fatigue and ability to keep eyes open.  +2 (A) advance hips to recliner and manual cues to advance LE. Ambulation/Gait Ambulation/Gait Assistance: Not tested (comment) Modified Rankin  (Stroke Patients Only) Pre-Morbid Rankin Score: No symptoms Modified Rankin: Severe disability    Exercises     PT Diagnosis:    PT Problem List:   PT Treatment Interventions:     PT Goals Acute Rehab PT Goals PT Goal Formulation: With patient Time For Goal Achievement: 12/30/11 Potential to Achieve Goals: Fair Pt will Roll Supine to Right Side: with modified independence PT Goal: Rolling Supine to Right Side - Progress: Not met Pt will Roll Supine to Left Side: with modified independence PT Goal: Rolling Supine to Left Side - Progress: Not met Pt will go Supine/Side to Sit: with supervision PT Goal: Supine/Side to Sit - Progress: Not met Pt will go Sit to Supine/Side: with supervision PT Goal: Sit to Supine/Side - Progress: Not met Pt will go Sit to Stand: with min assist PT Goal: Sit to Stand - Progress: Not met Pt will go Stand to Sit: with min assist PT Goal: Stand to Sit - Progress: Not met Pt will Transfer Bed to Chair/Chair to Bed: with mod assist PT Transfer Goal: Bed to Chair/Chair to Bed - Progress: Not met  Visit Information  Last PT Received On: 12/22/11 Assistance Needed: +2 PT/OT Co-Evaluation/Treatment: Yes    Subjective Data      Cognition  Overall Cognitive Status: Impaired Area of Impairment: Attention;Safety/judgement;Problem solving Difficult to assess due to: Level of arousal Arousal/Alertness: Lethargic Behavior During Session: Lethargic Current Attention Level: Sustained Problem Solving: Pt slow to process and needs extra time to complete task. Cognition - Other Comments: responds to one step commands inconsistently    Balance  Balance Balance Assessed: Yes  Static Sitting Balance Static Sitting - Balance Support: Feet supported;Bilateral upper extremity supported Static Sitting - Level of Assistance: 4: Min assist;5: Stand by assistance Static Sitting - Comment/# of Minutes: initial min (A) to maintain balance and able to progress to  minguard. Static Standing Balance Static Standing - Balance Support: Bilateral upper extremity supported;During functional activity Static Standing - Level of Assistance: 1: +2 Total assist (10%) Static Standing - Comment/# of Minutes: Attempted to stand x2 however pt unable to stand completely upright with max cues.  End of Session PT - End of Session Equipment Utilized During Treatment: Gait belt Activity Tolerance: Patient limited by fatigue;Treatment limited secondary to medical complications (Comment) Patient left: in chair;with call bell/phone within reach Nurse Communication: Mobility status;Other (comment)   GP     Brendan Scott 12/22/2011, 1:03 PM

## 2011-12-22 NOTE — Progress Notes (Signed)
Speech Language Pathology Dysphagia Treatment Patient Details Name: Brendan Scott MRN: [id number] DOB: [birth year] Today's Date: 12/22/2011 Time: 3086-5784 SLP Time Calculation (min): 16 min  Assessment / Plan / Recommendation Clinical Impression  Pt continues to present with very slight facial twitching right after SLP arrival, then calming after 15 seconds. Pt was awake but lethargic during todays session, follows all commands. After oral care ice chips given to pt resulting in immedate spillage from left side of mouth. SLP provided teaspoon of puree to posterior right part of oral cavity with much improved results, no oral residual and suprisingly strong swallow response. After 2-3 boluses wet vocal quality resumed, suspect left pharyngeal weakness with residual. Given fluctuating alertness and overt signs of significant oral and oropharyngeal dysphagia, pt would benefit from continued alternate nutrition until alertness is consistently improved. SLP will f/u tomorrow for any change in status.     Diet Recommendation  Continue with Current Diet: NPO    SLP Plan Continue with current plan of care   Pertinent Vitals/Pain NA   Swallowing Goals  SLP Swallowing Goals Goal #3: Patient will present with absence of facial twitching/seizure activity in order to orally transit clinician provided po trials at bedside with min assist for use of compensatory strategies.  Swallow Study Goal #3 - Progress: Progressing toward goal Goal #4: Pt will sustain alertness for approx. 5 minutes with minimal verbal cues to determine readiness for PO consumption.  Swallow Study Goal #4 - Progress: Progressing toward goal  General Temperature Spikes Noted: Yes Respiratory Status: Room air Behavior/Cognition: Cooperative;Lethargic;Decreased sustained attention;Requires cueing Oral Cavity - Dentition: Dentures, top Patient Positioning: Upright in bed  Oral Cavity - Oral Hygiene Does patient have any of the  following "at risk" factors?: Other - dysphagia Patient is HIGH RISK - Oral Care Protocol followed (see row info): Yes Patient is AT RISK - Oral Care Protocol followed (see row info): Yes   Dysphagia Treatment Treatment focused on: Upgraded PO texture trials;Patient/family/caregiver education;Facilitation of oral phase Family/Caregiver Educated: wife Treatment Methods/Modalities: Skilled observation;Differential diagnosis Patient observed directly with PO's: Yes Type of PO's observed: Dysphagia 1 (puree);Ice chips Feeding: Total assist Liquids provided via: Teaspoon Oral Phase Signs & Symptoms: Prolonged bolus formation;Prolonged oral phase Pharyngeal Phase Signs & Symptoms: Suspected delayed swallow initiation;Wet vocal quality;Multiple swallows;Delayed cough Type of cueing: Verbal;Tactile   GO    Harlon Ditty, MA CCC-SLP [phone]  Claudine Mouton 12/22/2011, 10:47 AM

## 2011-12-22 NOTE — Progress Notes (Signed)
Occupational Therapy Treatment Patient Details Name: AZAVION BOUILLON MRN: [id number] DOB: 02/26/1945 Today's Date: 12/22/2011 Time: 4098-1191 OT Time Calculation (min): 23 min  OT Assessment / Plan / Recommendation Comments on Treatment Session Pt more alert this session- however, required increased assist for transfers.    Follow Up Recommendations  CIR;Supervision/Assistance - 24 hour    Barriers to Discharge       Equipment Recommendations  3 in 1 bedside comode    Recommendations for Other Services Rehab consult  Frequency     Plan      Precautions / Restrictions Precautions Precautions: Fall Precaution Comments: NG tube- keep mitts on when not working with therapy as pt attempts to pull out Restrictions Weight Bearing Restrictions: No   Pertinent Vitals/Pain Pt with no c/o or indications of pain    ADL  Upper Body Bathing: Maximal assistance Where Assessed - Upper Body Bathing: Supine, head of bed up Lower Body Bathing: +1 Total assistance Where Assessed - Lower Body Bathing: Rolling right and/or left (Max assist required to roll ) Toilet Transfer: +2 Total assistance Toilet Transfer: Patient Percentage: 10% Toilet Transfer Method: Stand pivot Toilet Transfer Equipment:  (bed to chair) Equipment Used: Gait belt Transfers/Ambulation Related to ADLs: +2total A(pt=20%) stand pivot from bed to chair. Pt decreased participation with ambulation/transfers this session- unclear as to why ADL Comments: Pt continues with decreased arousal. Pt able to maintain eyes open for longer this session- however, less participatory for transfers    OT Diagnosis:    OT Problem List:   OT Treatment Interventions:     OT Goals ADL Goals Pt Will Perform Grooming: with min assist;Supported;Sitting, edge of bed;Sitting at sink Pt Will Perform Upper Body Bathing: with min assist;Sitting, chair;Sitting, edge of bed;Supported ADL Goal: Upper Body Bathing - Progress: Progressing toward  goals Pt Will Perform Lower Body Bathing: with min assist;Sitting, chair;Sitting, edge of bed;Supported ADL Goal: Lower Body Bathing - Progress: Progressing toward goals Pt Will Transfer to Toilet: with mod assist;Ambulation;with DME;Comfort height toilet ADL Goal: Toilet Transfer - Progress: Not progressing Miscellaneous OT Goals Miscellaneous OT Goal #1: Pt will perform bed mobility with mod assist in prep for EOB ADLs. OT Goal: Miscellaneous Goal #1 - Progress: Progressing toward goals Miscellaneous OT Goal #2: Pt will tolerate >15 of therapy in order to increase activity tolerance as precursor for ADLs. OT Goal: Miscellaneous Goal #2 - Progress: Progressing toward goals Miscellaneous OT Goal #3: Pt will set EOB with min assist >5 min as precursor for ADLs. OT Goal: Miscellaneous Goal #3 - Progress: Progressing toward goals  Visit Information  Last OT Received On: 12/22/11 Assistance Needed: +3 or more PT/OT Co-Evaluation/Treatment: Yes    Subjective Data      Prior Functioning       Cognition  Overall Cognitive Status: Impaired Area of Impairment: Attention;Safety/judgement;Problem solving Difficult to assess due to: Level of arousal Arousal/Alertness: Lethargic Behavior During Session: Lethargic Current Attention Level: Sustained Problem Solving: Pt slow to process and needs extra time to complete task. Cognition - Other Comments: responds to one step commands inconsistently    Mobility  Shoulder Instructions Bed Mobility Bed Mobility: Supine to Sit Supine to Sit: 2: Max assist Sitting - Scoot to Edge of Bed: 2: Max assist Details for Bed Mobility Assistance: (A) to elevate trunk OOB.  Pt able to activate LE OOB with manual cues. Transfers Sit to Stand: 1: +2 Total assist;From bed Sit to Stand: Patient Percentage: 20% Stand to Sit: 1: +2 Total  assist;To chair/3-in-1 Stand to Sit: Patient Percentage: 20% Details for Transfer Assistance: Pt needed increase assistance  with transfer and unable to stand upright.  Very limited due to overall fatigue and ability to keep eyes open.  +2 (A) advance hips to recliner and manual cues to advance LE.       Exercises      Balance Balance Balance Assessed: Yes Static Sitting Balance Static Sitting - Balance Support: Feet supported;Bilateral upper extremity supported Static Sitting - Level of Assistance: 4: Min assist;5: Stand by assistance Static Sitting - Comment/# of Minutes: initial min (A) to maintain balance and able to progress to minguard. Static Standing Balance Static Standing - Balance Support: Bilateral upper extremity supported;During functional activity Static Standing - Level of Assistance: 1: +2 Total assist (10%) Static Standing - Comment/# of Minutes: Attempted to stand x2 however pt unable to stand completely upright with max cues.   End of Session OT - End of Session Equipment Utilized During Treatment: Gait belt Activity Tolerance: Patient limited by fatigue Patient left: with call bell/phone within reach;in chair;with nursing in room Nurse Communication: Mobility status  GO     Marrianne Sica 12/22/2011, 3:26 PM

## 2011-12-22 NOTE — Progress Notes (Signed)
Tube Feeding stopped/paused, RN with difficulty flushing line, MD in room, ABD xray order for placement check.

## 2011-12-22 NOTE — Procedures (Signed)
EEG NUMBER:  REFERRING PHYSICIAN:  Marney Doctor, MD.  HISTORY:  A 66 year old male with twitching of the left arm and face.  MEDICATIONS:  Klonopin, Plavix, Sublimaze, Vimpat, Keppra, Dilantin, Zocor.  CONDITIONS OF RECORDING:  This is a 16-channel EEG carried out with the patient in the lethargic state.  DESCRIPTION:  The background activity consists of a low-voltage, symmetrical, fairly well-organized 7 Hz theta activity seen from the parieto-occipital and posterotemporal regions.  Low-voltage, fast activity, poorly organized was seen anteriorly at times, superimposed on more posterior rhythms.  A mixture of theta and alpha was seen from these central and temporal regions.  Intermittently during the tracing is noted further slowing of the background rhythm with underlying polymorphic delta rhythm that is not sustained.  Stage II sleep was not obtained.  Hyperventilation was not performed.  Intermittent photic stimulation failed to elicit any change in the tracing.  IMPRESSION:  This is an EEG that is characterized by slow activity. This likely represents drowse.  No focal slowing is noted.  No epileptiform activity is noted.          ______________________________ Thana Farr, MD    [id number] D:  12/21/2011 18:21:56  T:  12/22/2011 12:41:05  Job #:  [id number]

## 2011-12-22 NOTE — Progress Notes (Signed)
Stroke Team Progress Note  HISTORY  Brendan Scott is an 66 y.o. male who was last seen normal at 14:30 on 12/14/2101 when he left for work. Patient never showed up to work. Family then found him in driveway unresponsive and called EMS. EMS arrived to find patient standing in driveway slightly confused, left facial droop and dysarthria. Code stroke was called and patient was brought to [facility] hospital. Patient remained with left facial droop, right gaze preference, unable to cross midline, dysarthria and left hemianopsia. CT scan of the head without contrast showed evidence of a fairly large mass lesion involving the left frontotemporal region as well as possible increased density of the right middle cerebral artery proximally, and possibly early MCA territory infarction. Mass lesion on the left was thought to be incidental and likely a meningioma. As such, he was not considered a candidate for intravenous thrombolytic therapy with TPA. Patient was subsequently taken to Interventional Radiology for cerebral arteriogram was performed which showed no signs of occlusion of the right middle cerebral artery nor right internal carotid artery. There was mass effect involving the left middle cerebral artery with reduced diameter and straightening. Chronic permanent tumor blush was also seen. NIH stroke score was 8, including visual field defect and neglect of his left side as well as facial weakness and extremity weakness, and slurred speech. Modified Rankin score was 3. Patient was taking aspirin 81 mg per day prior to admission. He was admitted to the neuro ICU for further evaluation and treatment.  SUBJECTIVE Therapists and nurses at the bedside. NG in place but not working. 6 cc residual per RN who is requesting KUB to further assess.left face twitching has stopped but intermittent left tongue and left arm twitchings persist  OBJECTIVE Most recent Vital Signs: Filed Vitals:   12/22/11 0418 12/22/11 0713 12/22/11  0730 12/22/11 0820  BP: 155/76 142/73    Pulse: 90 103  79  Temp: 99.3 F (37.4 C)  98.7 F (37.1 C)   TempSrc: Oral  Oral   Resp:  26  30  Height:      Weight:      SpO2: 98% 99%  100%    IV Fluid Intake:      . sodium chloride 75 mL/hr at 12/22/11 0835  . feeding supplement (JEVITY 1.2 CAL) 70 mL/hr at 12/22/11 0543   MEDICATIONS     . antiseptic oral rinse  15 mL Mouth Rinse BID  . aspirin  81 mg Oral Daily  . clonazePAM  0.5 mg Oral BID  . clopidogrel  75 mg Oral Q breakfast  . enoxaparin (LOVENOX) injection  40 mg Subcutaneous Q24H  . insulin aspart  0-9 Units Subcutaneous Q4H  . lacosamide (VIMPAT) IV  300 mg Intravenous Q12H  . levetiracetam  2,000 mg Intravenous Q12H  . lisinopril  5 mg Oral Daily  . LORazepam  2 mg Intravenous Once  . phenytoin (DILANTIN) IV  100 mg Intravenous Q8H  . senna-docusate  1 tablet Oral QHS  . simvastatin  20 mg Oral q1800  . [DISCONTINUED] aspirin EC  81 mg Oral Daily  . [DISCONTINUED] insulin aspart  0-5 Units Subcutaneous QHS  . [DISCONTINUED] insulin aspart  0-9 Units Subcutaneous TID WC  . [DISCONTINUED] levetiracetam  1,500 mg Intravenous Q12H   PRN:  fentaNYL, labetalol, midazolam  Diet:  NPO  Activity:  Up with assistance to chair DVT Prophylaxis:  Lovenox 40 mg sq daily   CLINICALLY SIGNIFICANT STUDIES Basic Metabolic Panel:  Lab 12/18/11 0504 12/17/11 0448  NA 138 139  K 3.6 3.5  CL 103 103  CO2 24 26  GLUCOSE 128* 141*  BUN 10 7  CREATININE 1.08 1.00  CALCIUM 9.2 9.1  MG -- --  PHOS -- --   Liver Function Tests:   Lab 12/17/11 0448 12/15/11 1550  AST 20 26  ALT 22 36  ALKPHOS 65 76  BILITOT 0.8 0.3  PROT 6.7 7.5  ALBUMIN 3.5 4.1   CBC:   Lab 12/18/11 0504 12/17/11 0448 12/15/11 1550  WBC 10.7* 9.5 --  NEUTROABS -- 7.3 9.2*  HGB 14.4 13.9 --  HCT 42.6 42.9 --  MCV 80.7 81.7 --  PLT 188 184 --   Coagulation:   Lab 12/15/11 1550  LABPROT 12.9  INR 0.98   Cardiac Enzymes:   Lab  12/15/11 1550  CKTOTAL --  CKMB --  CKMBINDEX --  TROPONINI <0.30   Urinalysis:   Lab 12/21/11 1028  COLORURINE YELLOW  LABSPEC 1.014  PHURINE 8.0  GLUCOSEU NEGATIVE  HGBUR NEGATIVE  BILIRUBINUR NEGATIVE  KETONESUR NEGATIVE  PROTEINUR NEGATIVE  UROBILINOGEN 1.0  NITRITE NEGATIVE  LEUKOCYTESUR NEGATIVE   Lipid Panel    Component Value Date/Time   CHOL 184 12/16/2011 0430   TRIG 51 12/16/2011 0430   HDL 60 12/16/2011 0430   CHOLHDL 3.1 12/16/2011 0430   VLDL 10 12/16/2011 0430   LDLCALC 114* 12/16/2011 0430   HgbA1C  Lab Results  Component Value Date   HGBA1C 6.9* 12/16/2011    Urine Drug Screen:   No results found for this basename: labopia,  cocainscrnur,  labbenz,  amphetmu,  thcu,  labbarb    Alcohol Level: No results found for this basename: ETH:2 in the last 168 hours  CT of the brain   12/16/2011  Exam is markedly motion degraded.  Findings raise possibility of progressive right hemispheric infarct.  Limited for detection of intracranial hemorrhage.  Left anterior left temporal lobe/ clinoid/cavernous sinus region hyperdense lesions suggestive of a meningioma.  This can be evaluated with contrast enhanced MR.  12/15/2011  Right middle cerebral artery slightly dense.  There is minimal hypodensity right opercular region. This may reflect changes of early acute infarct.  Large hyperdense mass centered at the left clinoid with extension into the left middle cranial fossa, left cavernous sinus, left subfrontal region and orbital apex region having appearance suggestive of a meningioma with bony remodeling.  This can be assessed with contrast enhanced MR.   Hypodensity adjacent left frontal lobe may reflect vasogenic edema.  No intracranial hemorrhage.  Prior right mastoidectomy.  Mild thickening along the peripheral aspect of the mastoidectomy site.  Partial opacification of residual right mastoid air cells.  12/20/2011  Evolving cytotoxic edema in the right  frontotemporal region. Early hemorrhagic transformation right posterior frontal cortex. No significant mass effect. 3 mm right-to-left shift.  Cerebral Angiogram 12/14/2101 S/P 4 vessel cerebral arteriogram. RT CFA approach. 1. No occlusions ,stenosis dissections or intraluminal filling defects seen . 2. Venous outflow WNLs.  MRI of the brain  12/16/2011   Large right middle cerebral artery distribution non hemorrhagic infarct.  Large left sphenoid wing mass consistent with meningioma with invasion    MRA of the brain  12/16/2011    Circumferential narrowing (caused by a meningioma) of the supraclinoid aspect of the left internal carotid artery, the carotid terminus, the A1 segment of the left anterior cerebral artery and slightly less so the M1 segment of the left middle  cerebral artery (which is elevated by the mass).  Decreased caliber/number of visualized left middle cerebral artery branch vessels may indicate flow limiting stenosis by the meningioma.  Right internal carotid artery/carotid terminus without significant narrowing.  No significant narrowing of the M1 segment or A1 segment of the right middle cerebral artery/anterior cerebral artery respectively.  Right vertebral artery is dominant.  Moderate to marked tandem stenosis left vertebral artery.  High-grade stenosis proximal basilar artery.   2D Echocardiogram  EF 55-60% with no source of embolus. Patient in atrial fibrillation   Carotid Doppler  See cerebral angio  EEG 11/17/2013This is a normal EEG recording during wakefulness. No evidence of an epileptic disorder was demonstrated. Rhythmic muscle activity consistent with tremor was seen frequently throughout the  record, as described above 12/21/2011-pending   CXR  12/15/2011   Mild cardiac enlargement.  No active infiltrates or failure.   EKG  ATRIAL FIBRILLATION, V-RATE 68- 92 ~ var'd rate, irreg atrial activity  LATERAL INFARCT, OLD ~ Q>81mS, abnormal ST-T, I aVL V5-6  ABNRM R  PROG, CONSIDER ASMI OR LEAD PLACEMENT ~ Q >48mS, diminished R, V2  Therapy Recommendations PT - CIR; OT - CIR  Physical Exam   Patient is more awake and is cooperative at the time of examination.  Minor twitching of the left tongue and left arm is noted.  Speech is dysarthric. Language is fluent and comprehension intact. Respiratory examination is clear.  Cardiovascular examination reveals an occasional irregular heart rhythm, no obvious murmurs or rubs are noted.  No carotid bruits are noted.  Abdomen is obese, soft and nontender, positive bowel sounds.  Extremities are without significant edema  The patient has clear left facial droop, some conjugate eye deviation to the right. The patient has a dense left homonymous visual field deficit.  The patient has a left hemiparesis 3/5, left arm greater than left leg. Right side appears to have normal strength.  No obvious ataxia seen with the extremities.  The patient could not be ambulated.  Deep tendon reflexes are symmetric.   ASSESSMENT Mr. Brendan Scott is a 66 y.o. male presenting with confusion, left facial droop and dysarthria. MRI confirms a large right middle cerebral artery distribution non hemorrhagic infarct. 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  Newly identified left frontal and anterior temporal meningioma. Infarct likely embolic infarct given atrial fibrillation. On aspirin 81 mg orally every day prior to admission. Now on aspirin 300 mg rectally every day for secondary stroke prevention.   Atrial fibrillation Diabetes, HgbA1c 6.9 Hyperlipidemia, LDL 114, not on statin PTA, goal LDL < 70 for diabetics. On fish oil PTA. Now on zocor Obesity class 2, Body mass index is 35.05 kg/(m^2). Focal status epilepticus with ongoing simple partial seizures involving left side of his face, EEG without epileptiform discharges, on dilantin, vimpat,  keppra, and klonopin Dysphagia secondary to stroke/seizure Hypertension Cytotoxic edema EEG Urinalysis normal 11/21  Hospital day # 7  TREATMENT/PLAN  Continue plavix 75mg  plus baby aspirin 81mg  daily per NG tube. Once able to take po, change to coumadin due to new afib  Failed swallow, check NG with abd xray as not working this am. Resume tube feedings.  EEG-pending  Rehab candidate at time of discharge   Annie Main, MSN, RN, ANVP-BC, ANP-BC, [facility] Pager: [phone] 12/22/2011 11:14 AM

## 2011-12-22 NOTE — Evaluation (Signed)
Speech Language Pathology Evaluation Patient Details Name: Brendan Scott MRN: [id number] DOB: 11/26/1945 Today's Date: 12/22/2011 Time: 4098-1191 SLP Time Calculation (min): 39 min  Problem List:  Patient Active Problem List  Diagnosis  . DIABETES  . OSTEOARTHRITIS, LOWER LEG  . OSTEOARTHRITIS, KNEE, LEFT  . JOINT EFFUSION, KNEE  . Pain in Joint, Lower Leg  . SCIATICA, RIGHT  . TOTAL KNEE FOLLOW-UP  . Pain, knee  . S/P total knee replacement  . Diabetes mellitus  . Focal motor seizure  . Embolic stroke involving middle cerebral artery   Past Medical History:  Past Medical History  Diagnosis Date  . Arthritis   . Diabetes mellitus   . Atrial fibrillation    Past Surgical History:  Past Surgical History  Procedure Date  . Total knee arthroplasty     Bilateral   HPI:  66 y/o  found in driveway unresponsive by family and called EMS.  Patient admitted to [facility] ED with left facial droop, right gaze preference, unable to cross midline, dysarthria and left hemianopsia, CT showed evidence of large mass lesion involving the left frontotemporal region as well as possible increased density .  Mass lesion thought to be meningioma. MRI L Large acute non hemorrhagic right hemispheric infarct involving portions of the right frontal lobe, right temporal lobe, right opercular and sub insular region extending to the border with right parietal lobe. Pt has developed cytotoxic edema. Has had constant partial seizure activity.    Assessment / Plan / Recommendation Clinical Impression  Pt presents with moderate cognitive deficits including sustained attention, memory, emergent awareness and basic functional and verbal problem solving. Pts arousal is impacted by sedating seizure medications. He requires max cues to sustain attention to verbal and fucntional tasks but does demonstrate ability to follow multistep commands and understand complex language. Pt has left visual neglect but will look past  midline with max verbal and visual cues. Mild dysarthria due to left oral weakness. Pts cognition likely to improve as arousal improves. Pt will benefit from continued SLP therapy to faciliate pts increased participation in ADLs and safety awareness. Pt would benefit from CIR at d/c.     SLP Assessment  Patient needs continued Speech Lanaguage Pathology Services    Follow Up Recommendations  Inpatient Rehab    Frequency and Duration min 2x/week  2 weeks   Pertinent Vitals/Pain NA   SLP Goals  SLP Goals Potential to Achieve Goals: Good SLP Goal #1: Pt will sustain attention to basic functional task for 1 minute with mac contextual cues.  SLP Goal #2: Pt will direct attention to left visual field as needed with max contextual cues during basic functional task.  SLP Goal #3: Pt will complete basic functional problem solving with mod contextual cues.   SLP Evaluation Prior Functioning  Cognitive/Linguistic Baseline: Within functional limits Type of Home: Mobile home Lives With: Spouse Available Help at Discharge: Family;Available 24 hours/day Vocation: Full time employment   Cognition  Overall Cognitive Status: Impaired Arousal/Alertness: Lethargic Orientation Level: Oriented to person;Oriented to place;Oriented to situation Attention: Focused;Sustained Focused Attention: Appears intact Sustained Attention: Impaired Sustained Attention Impairment: Verbal basic;Functional basic Memory:  (NT) Awareness: Impaired Awareness Impairment: Emergent impairment Problem Solving: Impaired Problem Solving Impairment: Verbal basic;Functional basic Executive Function: Reasoning;Self Monitoring Reasoning: Impaired Reasoning Impairment: Functional basic;Verbal basic Self Monitoring: Impaired Self Monitoring Impairment: Functional basic Safety/Judgment: Impaired    Comprehension  Auditory Comprehension Overall Auditory Comprehension: Appears within functional limits for tasks  assessed Yes/No Questions: Within  Functional Limits Commands: Impaired One Step Basic Commands: 75-100% accurate Two Step Basic Commands: 75-100% accurate Multistep Basic Commands: 75-100% accurate Complex Commands: 50-74% accurate    Expression Verbal Expression Overall Verbal Expression: Impaired Initiation: No impairment Automatic Speech: Name;Social Response;Counting Level of Generative/Spontaneous Verbalization: Word Repetition: No impairment Naming: No impairment Pragmatics: Impairment Impairments: Eye contact Interfering Components: Attention;Speech intelligibility   Oral / Motor Oral Motor/Sensory Function Overall Oral Motor/Sensory Function: Impaired Labial ROM: Reduced left Labial Symmetry: Abnormal symmetry left Labial Strength: Reduced Labial Sensation: Reduced Lingual ROM: Reduced left Lingual Symmetry: Abnormal symmetry left Lingual Strength: Reduced Lingual Sensation: Reduced Facial ROM: Reduced left Facial Symmetry: Left droop Facial Strength: Reduced Facial Sensation: Reduced Velum: Within Functional Limits Mandible: Within Functional Limits Motor Speech Overall Motor Speech: Impaired Respiration: Impaired Level of Impairment: Phrase Phonation: Wet Resonance: Within functional limits Articulation: Impaired Level of Impairment: Word Intelligibility: Intelligibility reduced Word: 50-74% accurate Phrase: 50-74% accurate Sentence: Not tested Conversation: Not tested Motor Planning: Witnin functional limits   GO     Brendan Scott, Brendan Scott 12/22/2011, 10:36 AM

## 2011-12-22 NOTE — Progress Notes (Signed)
New NGT placed, R nare, pt tol well. KUB verified placement, TF restarted @70cc /hr current goal, spoke with family at [facility] regarding updates

## 2011-12-23 LAB — GLUCOSE, CAPILLARY

## 2011-12-23 MED ORDER — SODIUM CHLORIDE 0.9 % IJ SOLN
INTRAMUSCULAR | Status: AC
  Administered 2011-12-24: 10 mL
  Filled 2011-12-23: qty 10

## 2011-12-23 NOTE — Progress Notes (Signed)
Stroke Team Progress Note  HISTORY  Brendan Scott is an 66 y.o. male who was last seen normal at 14:30 on 12/14/2101 when he left for work. Patient never showed up to work. Family then found him in driveway unresponsive and called EMS. EMS arrived to find patient standing in driveway slightly confused, left facial droop and dysarthria. Code stroke was called and patient was brought to [facility] hospital. Patient remained with left facial droop, right gaze preference, unable to cross midline, dysarthria and left hemianopsia. CT scan of the head without contrast showed evidence of a fairly large mass lesion involving the left frontotemporal region as well as possible increased density of the right middle cerebral artery proximally, and possibly early MCA territory infarction. Mass lesion on the left was thought to be incidental and likely a meningioma. As such, he was not considered a candidate for intravenous thrombolytic therapy with TPA. Patient was subsequently taken to Interventional Radiology for cerebral arteriogram was performed which showed no signs of occlusion of the right middle cerebral artery nor right internal carotid artery. There was mass effect involving the left middle cerebral artery with reduced diameter and straightening. Chronic permanent tumor blush was also seen. NIH stroke score was 8, including visual field defect and neglect of his left side as well as facial weakness and extremity weakness, and slurred speech. Modified Rankin score was 3. Patient was taking aspirin 81 mg per day prior to admission. He was admitted to the neuro ICU for further evaluation and treatment.  SUBJECTIVE Patient comfortable. NGT now positioned and working. Face twitching has reduced, but is present.   OBJECTIVE Most recent Vital Signs: Filed Vitals:   12/23/11 0400 12/23/11 0445 12/23/11 0700 12/23/11 0801  BP: 151/86   159/81  Pulse: 71   72  Temp: 98.6 F (37 C)  98.5 F (36.9 C)   TempSrc: Oral  Oral    Resp: 30   29  Height:      Weight:  101.7 kg (224 lb 3.3 oz)    SpO2: 100%   98%    IV Fluid Intake:      . sodium chloride 75 mL/hr at 12/23/11 0800  . feeding supplement (JEVITY 1.2 CAL) 1,000 mL (12/22/11 1831)   MEDICATIONS     . antiseptic oral rinse  15 mL Mouth Rinse BID  . aspirin  81 mg Oral Daily  . clonazePAM  0.5 mg Oral BID  . clopidogrel  75 mg Oral Q breakfast  . enoxaparin (LOVENOX) injection  40 mg Subcutaneous Q24H  . insulin aspart  0-9 Units Subcutaneous Q4H  . lacosamide (VIMPAT) IV  300 mg Intravenous Q12H  . levetiracetam  2,000 mg Intravenous Q12H  . lisinopril  5 mg Oral Daily  . LORazepam  2 mg Intravenous Once  . phenytoin (DILANTIN) IV  100 mg Intravenous Q8H  . senna-docusate  1 tablet Oral QHS  . simvastatin  20 mg Oral q1800   PRN:  fentaNYL, labetalol, midazolam  Diet:  NPO  Activity:  Up with assistance to chair DVT Prophylaxis:  Lovenox 40 mg sq daily   CLINICALLY SIGNIFICANT STUDIES Basic Metabolic Panel:   Lab 12/18/11 0504 12/17/11 0448  NA 138 139  K 3.6 3.5  CL 103 103  CO2 24 26  GLUCOSE 128* 141*  BUN 10 7  CREATININE 1.08 1.00  CALCIUM 9.2 9.1  MG -- --  PHOS -- --   Liver Function Tests:   Lab 12/17/11 0448  AST  20  ALT 22  ALKPHOS 65  BILITOT 0.8  PROT 6.7  ALBUMIN 3.5   CBC:   Lab 12/18/11 0504 12/17/11 0448  WBC 10.7* 9.5  NEUTROABS -- 7.3  HGB 14.4 13.9  HCT 42.6 42.9  MCV 80.7 81.7  PLT 188 184   Coagulation:  No results found for this basename: LABPROT:4,INR:4 in the last 168 hours Cardiac Enzymes:  No results found for this basename: CKTOTAL:3,CKMB:3,CKMBINDEX:3,TROPONINI:3 in the last 168 hours Urinalysis:   Lab 12/21/11 1028  COLORURINE YELLOW  LABSPEC 1.014  PHURINE 8.0  GLUCOSEU NEGATIVE  HGBUR NEGATIVE  BILIRUBINUR NEGATIVE  KETONESUR NEGATIVE  PROTEINUR NEGATIVE  UROBILINOGEN 1.0  NITRITE NEGATIVE  LEUKOCYTESUR NEGATIVE   Lipid Panel    Component Value Date/Time   CHOL  184 12/16/2011 0430   TRIG 51 12/16/2011 0430   HDL 60 12/16/2011 0430   CHOLHDL 3.1 12/16/2011 0430   VLDL 10 12/16/2011 0430   LDLCALC 114* 12/16/2011 0430   HgbA1C  Lab Results  Component Value Date   HGBA1C 6.9* 12/16/2011    Urine Drug Screen:   No results found for this basename: labopia,  cocainscrnur,  labbenz,  amphetmu,  thcu,  labbarb    Alcohol Level: No results found for this basename: ETH:2 in the last 168 hours  CT of the brain   12/16/2011  Exam is markedly motion degraded.  Findings raise possibility of progressive right hemispheric infarct.  Limited for detection of intracranial hemorrhage.  Left anterior left temporal lobe/ clinoid/cavernous sinus region hyperdense lesions suggestive of a meningioma.  This can be evaluated with contrast enhanced MR.  12/15/2011  Right middle cerebral artery slightly dense.  There is minimal hypodensity right opercular region. This may reflect changes of early acute infarct.  Large hyperdense mass centered at the left clinoid with extension into the left middle cranial fossa, left cavernous sinus, left subfrontal region and orbital apex region having appearance suggestive of a meningioma with bony remodeling.  This can be assessed with contrast enhanced MR.   Hypodensity adjacent left frontal lobe may reflect vasogenic edema.  No intracranial hemorrhage.  Prior right mastoidectomy.  Mild thickening along the peripheral aspect of the mastoidectomy site.  Partial opacification of residual right mastoid air cells.  12/20/2011  Evolving cytotoxic edema in the right frontotemporal region. Early hemorrhagic transformation right posterior frontal cortex. No significant mass effect. 3 mm right-to-left shift.  Cerebral Angiogram 12/14/2101 S/P 4 vessel cerebral arteriogram. RT CFA approach. 1. No occlusions ,stenosis dissections or intraluminal filling defects seen . 2. Venous outflow WNLs.  MRI of the brain  12/16/2011   Large right middle cerebral  artery distribution non hemorrhagic infarct.  Large left sphenoid wing mass consistent with meningioma with invasion    MRA of the brain  12/16/2011    Circumferential narrowing (caused by a meningioma) of the supraclinoid aspect of the left internal carotid artery, the carotid terminus, the A1 segment of the left anterior cerebral artery and slightly less so the M1 segment of the left middle cerebral artery (which is elevated by the mass).  Decreased caliber/number of visualized left middle cerebral artery branch vessels may indicate flow limiting stenosis by the meningioma.  Right internal carotid artery/carotid terminus without significant narrowing.  No significant narrowing of the M1 segment or A1 segment of the right middle cerebral artery/anterior cerebral artery respectively.  Right vertebral artery is dominant.  Moderate to marked tandem stenosis left vertebral artery.  High-grade stenosis proximal basilar artery.  2D Echocardiogram  EF 55-60% with no source of embolus. Patient in atrial fibrillation   Carotid Doppler  See cerebral angio  EEG 11/17/2013This is a normal EEG recording during wakefulness. No evidence of an epileptic disorder was demonstrated. Rhythmic muscle activity consistent with tremor was seen frequently throughout the  record, as described above 12/21/2011-pending   CXR  12/15/2011   Mild cardiac enlargement.  No active infiltrates or failure.   EKG  ATRIAL FIBRILLATION, V-RATE 68- 92 ~ var'd rate, irreg atrial activity  LATERAL INFARCT, OLD ~ Q>79mS, abnormal ST-T, I aVL V5-6  ABNRM R PROG, CONSIDER ASMI OR LEAD PLACEMENT ~ Q >45mS, diminished R, V2  Therapy Recommendations PT - CIR; OT - CIR  Physical Exam   Patient is more awake and is cooperative at the time of examination.  Minor twitching of the face. No arm twitching Speech is dysarthric. Language is fluent and comprehension intact. NAMES PEN AS "PHONE". Respiratory examination SHOWS COARSE BREATH SOUNDS  AND RHONCHI WITH INT COUGHING. Cardiovascular examination reveals an irregular heart rhythm, no murmurs.  No carotid bruits are noted.  The patient has clear left LOWER facial droop, some conjugate eye deviation to the right. The patient has a dense left homonymous visual field deficit.  The patient has a left hemiparesis 3/5, left arm greater than left leg. Right side appears to have normal strength.  No obvious ataxia seen with the extremities.  The patient could not be ambulated.  Deep tendon reflexes are symmetric.   ASSESSMENT Brendan Scott is a 66 y.o. male presenting with confusion, left facial droop and dysarthria. MRI confirms a large right middle cerebral artery distribution non hemorrhagic infarct. 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  Newly identified left frontal and anterior temporal meningioma. Infarct likely embolic infarct given atrial fibrillation. On aspirin 81 mg orally every day prior to admission. Now on aspirin 300 mg rectally every day for secondary stroke prevention.   Atrial fibrillation Diabetes, HgbA1c 6.9 Hyperlipidemia, LDL 114, not on statin PTA, goal LDL < 70 for diabetics. On fish oil PTA. Now on zocor Obesity class 2, Body mass index is 36.19 kg/(m^2). Focal status epilepticus with ongoing simple partial seizures involving left side of his face, EEG without epileptiform discharges, on dilantin, vimpat, keppra, and klonopin Dysphagia secondary to stroke/seizure Hypertension Cytotoxic edema EEG Urinalysis normal 11/21  Hospital day # 8  TREATMENT/PLAN  Continue plavix 75mg  plus baby aspirin 81mg  daily per NG tube. Once able to take po, change to coumadin due to new afib CONTINUE dilantin, vimpat, keppra, and klonopin for seizure control; may taper clopnazepam to improve wakefulness if sz stop  Continue tube feedings  Rehab candidate at time of discharge     [facility] - Stroke Team Joycelyn Schmid, MD 12/23/2011, 10:40 AM   Please refer to [email or web address] for on-call Stroke MD

## 2011-12-24 ENCOUNTER — Inpatient Hospital Stay (HOSPITAL_COMMUNITY): Payer: BC Managed Care – PPO

## 2011-12-24 DIAGNOSIS — I6350 Cerebral infarction due to unspecified occlusion or stenosis of unspecified cerebral artery: Secondary | ICD-10-CM

## 2011-12-24 DIAGNOSIS — T17908A Unspecified foreign body in respiratory tract, part unspecified causing other injury, initial encounter: Secondary | ICD-10-CM

## 2011-12-24 LAB — CBC WITH DIFFERENTIAL/PLATELET
Basophils Absolute: 0 10*3/uL (ref 0.0–0.1)
Basophils Relative: 0 % (ref 0–1)
Eosinophils Absolute: 0.3 10*3/uL (ref 0.0–0.7)
MCH: 26.7 pg (ref 26.0–34.0)
MCHC: 33.3 g/dL (ref 30.0–36.0)
Neutrophils Relative %: 78 % — ABNORMAL HIGH (ref 43–77)
Platelets: 252 10*3/uL (ref 150–400)
RBC: 4.98 MIL/uL (ref 4.22–5.81)

## 2011-12-24 LAB — BASIC METABOLIC PANEL
GFR calc Af Amer: >90 mL/min (ref >90)
GFR calc non Af Amer: 85 mL/min — ABNORMAL LOW (ref >90)
Potassium: 3.9 mEq/L (ref 3.5–5.1)
Sodium: 132 mEq/L — ABNORMAL LOW (ref 135–145)

## 2011-12-24 LAB — GLUCOSE, CAPILLARY
Glucose-Capillary: 176 mg/dL — ABNORMAL HIGH (ref 70–99)
Glucose-Capillary: 195 mg/dL — ABNORMAL HIGH (ref 70–99)
Glucose-Capillary: 197 mg/dL — ABNORMAL HIGH (ref 70–99)
Glucose-Capillary: 205 mg/dL — ABNORMAL HIGH (ref 70–99)
Glucose-Capillary: 214 mg/dL — ABNORMAL HIGH (ref 70–99)
Glucose-Capillary: 228 mg/dL — ABNORMAL HIGH (ref 70–99)

## 2011-12-24 LAB — URINALYSIS, ROUTINE W REFLEX MICROSCOPIC
Nitrite: NEGATIVE
Specific Gravity, Urine: 1.016 (ref 1.005–1.030)
Urobilinogen, UA: 1 mg/dL (ref 0.0–1.0)

## 2011-12-24 LAB — PHENYTOIN LEVEL, TOTAL: Phenytoin Lvl: 8.8 ug/mL — ABNORMAL LOW (ref 10.0–20.0)

## 2011-12-24 MED ORDER — CLONAZEPAM 0.5 MG PO TABS
0.5000 mg | ORAL_TABLET | Freq: Every day | ORAL | Status: DC
  Filled 2011-12-24: qty 1

## 2011-12-24 MED ORDER — GUAIFENESIN 100 MG/5ML PO SOLN
600.0000 mg | Freq: Four times a day (QID) | ORAL | Status: DC
  Administered 2011-12-24 – 2011-12-25 (×3): 600 mg via ORAL
  Filled 2011-12-24 (×8): qty 118

## 2011-12-24 MED ORDER — GUAIFENESIN ER 600 MG PO TB12
1200.0000 mg | ORAL_TABLET | Freq: Two times a day (BID) | ORAL | Status: DC
  Administered 2011-12-24: 1200 mg via ORAL
  Filled 2011-12-24 (×2): qty 2

## 2011-12-24 NOTE — Progress Notes (Signed)
Stroke Team Progress Note  HISTORY  Brendan Scott is an 66 y.o. male who was last seen normal at 14:30 on 12/14/2101 when he left for work. Patient never showed up to work. Family then found him in driveway unresponsive and called EMS. EMS arrived to find patient standing in driveway slightly confused, left facial droop and dysarthria. Code stroke was called and patient was brought to [facility] hospital. Patient remained with left facial droop, right gaze preference, unable to cross midline, dysarthria and left hemianopsia. CT scan of the head without contrast showed evidence of a fairly large mass lesion involving the left frontotemporal region as well as possible increased density of the right middle cerebral artery proximally, and possibly early MCA territory infarction. Mass lesion on the left was thought to be incidental and likely a meningioma. As such, he was not considered a candidate for intravenous thrombolytic therapy with TPA. Patient was subsequently taken to Interventional Radiology for cerebral arteriogram was performed which showed no signs of occlusion of the right middle cerebral artery nor right internal carotid artery. There was mass effect involving the left middle cerebral artery with reduced diameter and straightening. Chronic permanent tumor blush was also seen. NIH stroke score was 8, including visual field defect and neglect of his left side as well as facial weakness and extremity weakness, and slurred speech. Modified Rankin score was 3. Patient was taking aspirin 81 mg per day prior to admission. He was admitted to the neuro ICU for further evaluation and treatment.  SUBJECTIVE Fever last night (100.8). Still with cough and rhonchi. Cloudy urine noted. More sleepy today. Seizures have stopped since yesterday AM.  OBJECTIVE Most recent Vital Signs: Filed Vitals:   12/23/11 2357 12/24/11 0402 12/24/11 0700 12/24/11 0735  BP: 157/69 154/74  155/77  Pulse: 90 89  82  Temp: 100.8 F  (38.2 C) 98.1 F (36.7 C) 98.3 F (36.8 C)   TempSrc: Oral Oral Oral   Resp: 33 28    Height:      Weight:  98.6 kg (217 lb 6 oz)    SpO2: 99% 97%  99%    IV Fluid Intake:      . sodium chloride 75 mL/hr at 12/24/11 0900  . feeding supplement (JEVITY 1.2 CAL) 1,000 mL (12/24/11 0538)   MEDICATIONS     . antiseptic oral rinse  15 mL Mouth Rinse BID  . aspirin  81 mg Oral Daily  . clonazePAM  0.5 mg Oral Daily  . clopidogrel  75 mg Oral Q breakfast  . enoxaparin (LOVENOX) injection  40 mg Subcutaneous Q24H  . insulin aspart  0-9 Units Subcutaneous Q4H  . lacosamide (VIMPAT) IV  300 mg Intravenous Q12H  . levetiracetam  2,000 mg Intravenous Q12H  . lisinopril  5 mg Oral Daily  . LORazepam  2 mg Intravenous Once  . phenytoin (DILANTIN) IV  100 mg Intravenous Q8H  . senna-docusate  1 tablet Oral QHS  . simvastatin  20 mg Oral q1800  . [COMPLETED] sodium chloride      . [DISCONTINUED] clonazePAM  0.5 mg Oral BID   PRN:  fentaNYL, labetalol, midazolam  Diet:  Tube feeding Activity:  Up with assistance to chair DVT Prophylaxis:  Lovenox 40 mg sq daily   CLINICALLY SIGNIFICANT STUDIES Basic Metabolic Panel:   Lab 12/18/11 0504  NA 138  K 3.6  CL 103  CO2 24  GLUCOSE 128*  BUN 10  CREATININE 1.08  CALCIUM 9.2  MG --  PHOS --   Liver Function Tests:  No results found for this basename: AST:2,ALT:2,ALKPHOS:2,BILITOT:2,PROT:2,ALBUMIN:2 in the last 168 hours CBC:   Lab 12/18/11 0504  WBC 10.7*  NEUTROABS --  HGB 14.4  HCT 42.6  MCV 80.7  PLT 188   Coagulation:  No results found for this basename: LABPROT:4,INR:4 in the last 168 hours Cardiac Enzymes:  No results found for this basename: CKTOTAL:3,CKMB:3,CKMBINDEX:3,TROPONINI:3 in the last 168 hours Urinalysis:   Lab 12/21/11 1028  COLORURINE YELLOW  LABSPEC 1.014  PHURINE 8.0  GLUCOSEU NEGATIVE  HGBUR NEGATIVE  BILIRUBINUR NEGATIVE  KETONESUR NEGATIVE  PROTEINUR NEGATIVE  UROBILINOGEN 1.0  NITRITE  NEGATIVE  LEUKOCYTESUR NEGATIVE   Lipid Panel    Component Value Date/Time   CHOL 184 12/16/2011 0430   TRIG 51 12/16/2011 0430   HDL 60 12/16/2011 0430   CHOLHDL 3.1 12/16/2011 0430   VLDL 10 12/16/2011 0430   LDLCALC 114* 12/16/2011 0430   HgbA1C  Lab Results  Component Value Date   HGBA1C 6.9* 12/16/2011    Urine Drug Screen:   No results found for this basename: labopia,  cocainscrnur,  labbenz,  amphetmu,  thcu,  labbarb    Alcohol Level: No results found for this basename: ETH:2 in the last 168 hours  CT of the brain   12/16/2011  Exam is markedly motion degraded.  Findings raise possibility of progressive right hemispheric infarct.  Limited for detection of intracranial hemorrhage.  Left anterior left temporal lobe/ clinoid/cavernous sinus region hyperdense lesions suggestive of a meningioma.  This can be evaluated with contrast enhanced MR.  12/15/2011  Right middle cerebral artery slightly dense.  There is minimal hypodensity right opercular region. This may reflect changes of early acute infarct.  Large hyperdense mass centered at the left clinoid with extension into the left middle cranial fossa, left cavernous sinus, left subfrontal region and orbital apex region having appearance suggestive of a meningioma with bony remodeling.  This can be assessed with contrast enhanced MR.   Hypodensity adjacent left frontal lobe may reflect vasogenic edema.  No intracranial hemorrhage.  Prior right mastoidectomy.  Mild thickening along the peripheral aspect of the mastoidectomy site.  Partial opacification of residual right mastoid air cells.  12/20/2011  Evolving cytotoxic edema in the right frontotemporal region. Early hemorrhagic transformation right posterior frontal cortex. No significant mass effect. 3 mm right-to-left shift.  Cerebral Angiogram 12/14/2101 S/P 4 vessel cerebral arteriogram. RT CFA approach. 1. No occlusions ,stenosis dissections or intraluminal filling defects seen  . 2. Venous outflow WNLs.  MRI of the brain  12/16/2011   Large right middle cerebral artery distribution non hemorrhagic infarct.  Large left sphenoid wing mass consistent with meningioma with invasion    MRA of the brain  12/16/2011    Circumferential narrowing (caused by a meningioma) of the supraclinoid aspect of the left internal carotid artery, the carotid terminus, the A1 segment of the left anterior cerebral artery and slightly less so the M1 segment of the left middle cerebral artery (which is elevated by the mass).  Decreased caliber/number of visualized left middle cerebral artery branch vessels may indicate flow limiting stenosis by the meningioma.  Right internal carotid artery/carotid terminus without significant narrowing.  No significant narrowing of the M1 segment or A1 segment of the right middle cerebral artery/anterior cerebral artery respectively.  Right vertebral artery is dominant.  Moderate to marked tandem stenosis left vertebral artery.  High-grade stenosis proximal basilar artery.   2D Echocardiogram  EF 55-60% with  no source of embolus. Patient in atrial fibrillation   Carotid Doppler  See cerebral angio  EEG 11/17/2013This is a normal EEG recording during wakefulness. No evidence of an epileptic disorder was demonstrated. Rhythmic muscle activity consistent with tremor was seen frequently throughout the  record, as described above 12/21/2011-pending   CXR  12/15/2011   Mild cardiac enlargement.  No active infiltrates or failure.   EKG  ATRIAL FIBRILLATION, V-RATE 68- 92 ~ var'd rate, irreg atrial activity  LATERAL INFARCT, OLD ~ Q>86mS, abnormal ST-T, I aVL V5-6  ABNRM R PROG, CONSIDER ASMI OR LEAD PLACEMENT ~ Q >84mS, diminished R, V2  Therapy Recommendations PT - CIR; OT - CIR  GENERAL EXAM: SLEEPING. DOES NOT FULLY WAKE UP TO STIM. NO FACIAL OR ARM TWITCHING.  CARDIOVASCULAR: Regular rate and rhythm, no murmurs, no carotid bruits  PULMONARY: COARSE UPPER  AIRWAY BREATH SOUNDS, RHONCHI, INTERMITTENT COUGHING  NEUROLOGIC: MENTAL STATUS: SLEEPING. BRIEFLY OPENS EYES TO STIM. CRANIAL NERVE: Pupils equal and reactive to light, DECR LEFT LOWER FACIAL STRENGTH MOTOR: RUE/RLE SPONT. LUE/LLE WITHDRAWAL TO STIM.   ASSESSMENT Mr. Brendan Scott is a 66 y.o. male presenting with confusion, left facial droop and dysarthria. MRI confirms a large right middle cerebral artery distribution non hemorrhagic infarct. 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  Newly identified left frontal and anterior temporal meningioma. Infarct likely embolic infarct given atrial fibrillation. On aspirin 81 mg orally every day prior to admission. Now on aspirin 300 mg rectally every day for secondary stroke prevention.   Atrial fibrillation Diabetes, HgbA1c 6.9 Hyperlipidemia, LDL 114, not on statin PTA, goal LDL < 70 for diabetics. On fish oil PTA. Now on zocor Obesity class 2, Body mass index is 35.09 kg/(m^2). Focal status epilepticus with ongoing simple partial seizures involving left side of his face, EEG without epileptiform discharges, on dilantin, vimpat, keppra, and klonopin Dysphagia secondary to stroke/seizure Hypertension Cytotoxic edema EEG Urinalysis normal 11/21  Hospital day # 9  TREATMENT/PLAN  Cough, fever, cloudy urine --> check CBC, BMP, UA, CXR, resp therapy  Continue plavix 75mg  plus baby aspirin 81mg  daily per NG tube. Once able to take po, change to coumadin due to new afib  CONTINUE dilantin, vimpat, keppra, and klonopin for seizure control; will reduce clopnazepam to daily dosing to improve wakefulness   Continue tube feedings  Rehab candidate at time of discharge   [facility] - Stroke Team Joycelyn Schmid, MD 12/24/2011, 11:24 AM   Please refer to [email or web address] for on-call Stroke MD

## 2011-12-24 NOTE — Consult Note (Signed)
PULMONARY  / CRITICAL CARE MEDICINE  Name: Brendan Scott MRN: [id number] DOB: [birth year]    LOS: 9  REFERRING PROVIDER:  Dr. Danae Orleans  CHIEF COMPLAINT:  Increased Secretions  BRIEF PATIENT DESCRIPTION: 66 y/o AAM with PMH of arthritis, DM, Afib who was admitted to [facility] 11/15 as a CODE STROKE.   CT scan of the head without contrast showed evidence of a fairly large mass lesion involving the left frontotemporal region as well as possible increased density of the right middle cerebral artery proximally, and possibly early MCA territory infarction.  MRI confirmed large R MCA infarct.  On 11/24 patient was noted to have increased secretions and PCCM consulted.     LINES / TUBES:   CULTURES: 11/24 Sputum>>>  ANTIBIOTICS:   SIGNIFICANT EVENTS:  11/15 - Admit with R MCA CVA  LEVEL OF CARE:  SDU PRIMARY SERVICE:  Stroke CONSULTANTS:  PCCM CODE STATUS:  Full Code DIET:  TF DVT Px:  Lovenox GI Px:   HISTORY OF PRESENT ILLNESS: 66 y/o AAM with PMH of arthritis, DM, Afib who was admitted to [facility] 11/15 as a CODE STROKE.   CT scan of the head without contrast showed evidence of a fairly large mass lesion involving the left frontotemporal region as well as possible increased density of the right middle cerebral artery proximally, and possibly early MCA territory infarction.  MRI confirmed large R MCA infarct.  On 11/24 patient was noted to have increased secretions and concern for respiratory insufficiency - PCCM consulted.  Noted fever over last 24 hours (tmax 101), no change in mental status.     PAST MEDICAL HISTORY :  Past Medical History  Diagnosis Date  . Arthritis   . Diabetes mellitus   . Atrial fibrillation    Past Surgical History  Procedure Date  . Total knee arthroplasty     Bilateral   Prior to Admission medications   Not on File   No Known Allergies  FAMILY HISTORY:  Family History  Problem Relation Age of Onset  . Arthritis    . Diabetes     SOCIAL HISTORY:  reports that he has quit smoking. He does not have any smokeless tobacco history on file. He reports that he does not drink alcohol or use illicit drugs.  REVIEW OF SYSTEMS:   Unable to complete with patient due to CVA - information obtained from family and nursing staff.   INTERVAL HISTORY:  Resting up right in bed.  No distress.    VITAL SIGNS: Temp:  [98.1 F (36.7 C)-101 F (38.3 C)] 99 F (37.2 C) (11/24 1300) Pulse Rate:  [82-101] 82  (11/24 0735) Resp:  [27-33] 28  (11/24 0402) BP: (151-157)/(64-84) 155/77 mmHg (11/24 0735) SpO2:  [97 %-99 %] 99 % (11/24 0735) Weight:  [217 lb 6 oz (98.6 kg)] 217 lb 6 oz (98.6 kg) (11/24 0402)  PHYSICAL EXAMINATION: General:  Chronically ill in NAD Neuro:  Opens eyes to name, spontaneous movement of R arm HEENT:  Mm pink/moist, NGT in place, no JVD Cardiovascular:  s1s2 IRIR Lungs:  resp's even/non-labored, lungs bilaterally coarse Abdomen:  Obese/soft, bsx4 active Musculoskeletal:  No acute deformity Skin:  intact   Lab 12/24/11 1126 12/18/11 0504  NA 132* 138  K 3.9 3.6  CL 97 103  CO2 27 24  BUN 14 10  CREATININE 0.95 1.08  GLUCOSE 243* 128*    Lab 12/24/11 1126 12/18/11 0504  HGB 13.3 14.4  HCT 40.0 42.6  WBC 13.6*  10.7*  PLT 252 188   Dg Abd Portable 1v  12/22/2011  *RADIOLOGY REPORT*  Clinical Data: Nasogastric tube placement.  PORTABLE ABDOMEN - 1 VIEW  Comparison: Earlier today at 1141 hours.  Findings: 1708 hours.  Nasogastric terminates at the proximal stomach.  Otherwise motion degraded exam, with decrease in gas filled small bowel loops.  No gross free intraperitoneal air.  IMPRESSION: Nasogastric tube terminating at the proximal stomach.   Original Report Authenticated By: Jeronimo Greaves, M.D.     ASSESSMENT / PLAN:  Acute Respiratory Failure  Assessment: acute respiratory failure in setting of neurological injury / CVA.  Patient with increased secretions requiring NTS per nursing.  He appears stable at this time  but is at high risk for aspiration / respiratory compromise if does not receive aggressive pulmonary hygiene and aspiration precautions.   Plan: -aspiration precautions -mucinex -vibra vest for 24 hours -NTS as needed -mobilize & upright positioning -now cxr, cbc, bmp -sputum culture  Pulmonary and Critical Care Medicine [facility] Pager: [phone]  12/24/2011, 1:29 PM  Attending:  I have seen and examined the patient with nurse practitioner/resident and agree with the note above.   We really need to work on pulmonary toilette here.  I have discussed this with nursing and RT.  I don't see pneumonia on his CXR, so no indication for antibiotics right now.  Yolonda Kida PCCM Pager: [phone] Cell: [phone] If no response, call [phone]

## 2011-12-24 NOTE — Progress Notes (Signed)
Respiratory Consult ordered. Pt is 66yo male adx on 11/15 with code stroke. Last CXR was done on admission. Family said pt had smoke but "quit many, many years ago".  It was somewhat difficult to decipher BS as rhonchorous due to pt moaning and coughing. No wheezing was heard. Possible fine crackles at right base. Lab work and CXR had been ordered and sputum culture. Patient had also been febrile in past 24 hours. RN had been getting copious secretions with yankauer. NTS x2 produced small amount of white, pink tinged thin secretions in specimen trap. Specimen sent to lab. With patient's strong cough and successful mobilization of secretions, oral sxn with yankauer proved to be more effective for patient than NTS. O2 saturation levels on room air are 97-99%.

## 2011-12-25 ENCOUNTER — Inpatient Hospital Stay (HOSPITAL_COMMUNITY): Payer: BC Managed Care – PPO

## 2011-12-25 DIAGNOSIS — I6330 Cerebral infarction due to thrombosis of unspecified cerebral artery: Secondary | ICD-10-CM

## 2011-12-25 DIAGNOSIS — R0609 Other forms of dyspnea: Secondary | ICD-10-CM

## 2011-12-25 DIAGNOSIS — J989 Respiratory disorder, unspecified: Secondary | ICD-10-CM

## 2011-12-25 DIAGNOSIS — R0989 Other specified symptoms and signs involving the circulatory and respiratory systems: Secondary | ICD-10-CM

## 2011-12-25 DIAGNOSIS — R0603 Acute respiratory distress: Secondary | ICD-10-CM

## 2011-12-25 LAB — GLUCOSE, CAPILLARY
Glucose-Capillary: 140 mg/dL — ABNORMAL HIGH (ref 70–99)
Glucose-Capillary: 201 mg/dL — ABNORMAL HIGH (ref 70–99)
Glucose-Capillary: 235 mg/dL — ABNORMAL HIGH (ref 70–99)
Glucose-Capillary: 247 mg/dL — ABNORMAL HIGH (ref 70–99)
Glucose-Capillary: 249 mg/dL — ABNORMAL HIGH (ref 70–99)

## 2011-12-25 MED ORDER — WARFARIN - PHARMACIST DOSING INPATIENT: Freq: Every day | Status: DC

## 2011-12-25 MED ORDER — CLONAZEPAM 0.5 MG PO TABS: 0.5000 mg | ORAL_TABLET | Freq: Every day | ORAL | Status: DC

## 2011-12-25 MED ORDER — SODIUM CHLORIDE 0.9 % IV SOLN
2500.0000 mg | Freq: Two times a day (BID) | INTRAVENOUS | Status: DC
  Administered 2011-12-25 – 2011-12-26 (×2): 2500 mg via INTRAVENOUS
  Filled 2011-12-25 (×4): qty 25

## 2011-12-25 MED ORDER — WARFARIN VIDEO: Freq: Once | Status: DC

## 2011-12-25 MED ORDER — COUMADIN BOOK
Freq: Once | Status: AC
  Administered 2011-12-25: 17:00:00
  Filled 2011-12-25: qty 1

## 2011-12-25 MED ORDER — CHLORPROMAZINE HCL 25 MG/ML IJ SOLN: 12.5000 mg | Freq: Once | INTRAMUSCULAR | Status: DC

## 2011-12-25 MED ORDER — CLONAZEPAM 0.5 MG PO TABS: 0.5000 mg | ORAL_TABLET | Freq: Two times a day (BID) | ORAL | Status: DC

## 2011-12-25 MED ORDER — DEXTROSE 5 % IV SOLN
1.0000 g | INTRAVENOUS | Status: DC
  Administered 2011-12-25 – 2011-12-27 (×3): 1 g via INTRAVENOUS
  Filled 2011-12-25 (×3): qty 10

## 2011-12-25 MED ORDER — RESOURCE THICKENUP CLEAR PO POWD
ORAL | Status: DC | PRN
  Filled 2011-12-25: qty 125

## 2011-12-25 MED ORDER — WARFARIN SODIUM 7.5 MG PO TABS
7.5000 mg | ORAL_TABLET | Freq: Once | ORAL | Status: AC
  Administered 2011-12-25: 7.5 mg via ORAL
  Filled 2011-12-25: qty 1

## 2011-12-25 MED ORDER — LORAZEPAM 2 MG/ML IJ SOLN
1.0000 mg | Freq: Once | INTRAMUSCULAR | Status: AC
  Administered 2011-12-25: 1 mg via INTRAVENOUS

## 2011-12-25 MED ORDER — ENSURE PUDDING PO PUDG
1.0000 | Freq: Three times a day (TID) | ORAL | Status: DC
  Administered 2011-12-25 – 2011-12-27 (×6): 1 via ORAL

## 2011-12-25 NOTE — Procedures (Signed)
Objective Swallowing Evaluation: Modified Barium Swallowing Study  Patient Details  Name: Brendan Scott MRN: [id number] Date of Birth: [birth year]  Today's Date: 12/25/2011 Time: 1010-1038 SLP Time Calculation (min): 28 min  Past Medical History:  Past Medical History  Diagnosis Date  . Arthritis   . Diabetes mellitus   . Atrial fibrillation    Past Surgical History:  Past Surgical History  Procedure Date  . Total knee arthroplasty     Bilateral   HPI:  66 y/o  found in driveway unresponsive by family and called EMS.  Patient admitted to [facility] ED with left facial droop, right gaze preference, unable to cross midline, dysarthria and left hemianopsia, CT showed evidence of large mass lesion involving the left frontotemporal region as well as possible increased density .  Mass lesion thought to be meningioma. MRI L Large acute non hemorrhagic right hemispheric infarct involving portions of the right frontal lobe, right temporal lobe, right opercular and sub insular region extending to the border with right parietal lobe. Pt has developed cytotoxic edema. Has had constant partial seizure activity. Pt has had some difficutly managing secretions with NG tube in place. Alertness has been impacted by meds.       Assessment / Plan / Recommendation Clinical Impression  Dysphagia Diagnosis: Mild oral phase dysphagia;Moderate pharyngeal phase dysphagia Clinical impression: Brendan Scott presents with a mild oral dysphagia with left buccal weakness though oral residuals are very mild and managed with a second swallow. Sensorimotor deficits result in a moderate pharyngeal phase dysphagia due to mild weakness throughout the hyolaryngeal complex with partial seizues noted throughout study (quivering of pharyngeal tissues and epiglottis). Thin liquids are penetrated before the swallow due a slight delay in airway protection with intermittent sensation. Thickened liquids lead to mild residuals that mix with standing  secretions in the pyriforms that are then silently aspirated post swallow. With pudding thick/puree the texture leads to a very cohesive bolus that the pt transited without significant residual. There was no aspiration or penetration with this texture. SLP is hopeful that management of pharyngeal secretions will improve with removal of NG and with usage of the swallow mechanism with PO (during study pt was clearing secretions much more effectively, hard strong cough and multiple swallows). Recommend pt initiate a puree only/pudding thick diet with no real liquids at this time. SLP will reassess daily for readiness to initate liquid consistency.     Treatment Recommendation  Therapy as outlined in treatment plan below    Diet Recommendation Dysphagia 1 (Puree);Pudding-thick liquid   Medication Administration: Crushed with puree Supervision: Full supervision/cueing for compensatory strategies;Staff feed patient;Trained caregiver to feed patient Compensations: Slow rate;Small sips/bites;Multiple dry swallows after each bite/sip;Hard cough after swallow Postural Changes and/or Swallow Maneuvers: Seated upright 90 degrees;Upright 30-60 min after meal    Other  Recommendations Oral Care Recommendations: Oral care QID Other Recommendations: Order thickener from pharmacy   Follow Up Recommendations  Inpatient Rehab    Frequency and Duration min 2x/week  2 weeks   Pertinent Vitals/Pain NA    SLP Swallow Goals Patient will consume recommended diet without observed clinical signs of aspiration with: Moderate assistance Patient will utilize recommended strategies during swallow to increase swallowing safety with: Moderate assistance   General HPI: 66 y/o  found in driveway unresponsive by family and called EMS.  Patient admitted to [facility] ED with left facial droop, right gaze preference, unable to cross midline, dysarthria and left hemianopsia, CT showed evidence of large mass lesion involving  the left  frontotemporal region as well as possible increased density .  Mass lesion thought to be meningioma. MRI L Large acute non hemorrhagic right hemispheric infarct involving portions of the right frontal lobe, right temporal lobe, right opercular and sub insular region extending to the border with right parietal lobe. Pt has developed cytotoxic edema. Has had constant partial seizure activity. Pt has had some difficutly managing secretions with NG tube in place. Alertness has been impacted by meds.   Type of Study: Modified Barium Swallowing Study Reason for Referral: Objectively evaluate swallowing function Diet Prior to this Study: NPO Temperature Spikes Noted: No Respiratory Status: Supplemental O2 delivered via (comment) History of Recent Intubation: No Behavior/Cognition: Cooperative;Alert;Requires cueing;Decreased sustained attention Oral Cavity - Dentition: Dentures, top Oral Motor / Sensory Function: Impaired - see Bedside swallow eval Self-Feeding Abilities: Needs assist Patient Positioning: Upright in chair Baseline Vocal Quality: Clear Volitional Cough: Strong Volitional Swallow: Able to elicit Anatomy: Within functional limits Pharyngeal Secretions:  (Not directly visualized but observed to mix with barium)    Reason for Referral Objectively evaluate swallowing function   Oral Phase Oral Preparation/Oral Phase Oral Phase: Impaired Oral Phase - Comment Oral Phase - Comment: No pooling of residuals in left buccal cavity despite weakness. Trace oral residuals transited with second swallow.    Pharyngeal Phase Pharyngeal Phase Pharyngeal Phase: Impaired Pharyngeal - Honey Pharyngeal - Honey Teaspoon: Reduced pharyngeal peristalsis;Reduced epiglottic inversion;Reduced anterior laryngeal mobility;Reduced laryngeal elevation;Reduced tongue base retraction;Penetration/Aspiration after swallow;Pharyngeal residue - pyriform sinuses;Pharyngeal residue - valleculae Penetration/Aspiration  details (honey teaspoon): Material enters airway, CONTACTS cords and not ejected out Pharyngeal - Nectar Pharyngeal - Nectar Teaspoon: Reduced pharyngeal peristalsis;Reduced epiglottic inversion;Reduced anterior laryngeal mobility;Reduced laryngeal elevation;Reduced airway/laryngeal closure;Reduced tongue base retraction;Penetration/Aspiration after swallow;Trace aspiration;Pharyngeal residue - valleculae;Pharyngeal residue - pyriform sinuses Penetration/Aspiration details (nectar teaspoon): Material enters airway, passes BELOW cords without attempt by patient to eject out (silent aspiration);Material enters airway, CONTACTS cords and not ejected out;Material does not enter airway Pharyngeal - Nectar Cup: Reduced pharyngeal peristalsis;Reduced epiglottic inversion;Reduced anterior laryngeal mobility;Reduced laryngeal elevation;Reduced airway/laryngeal closure;Reduced tongue base retraction;Penetration/Aspiration after swallow;Trace aspiration;Pharyngeal residue - valleculae;Pharyngeal residue - pyriform sinuses Penetration/Aspiration details (nectar cup): Material does not enter airway;Material enters airway, CONTACTS cords and not ejected out;Material enters airway, passes BELOW cords without attempt by patient to eject out (silent aspiration) Pharyngeal - Thin Pharyngeal - Thin Cup: Premature spillage to pyriform sinuses;Reduced pharyngeal peristalsis;Reduced anterior laryngeal mobility;Reduced epiglottic inversion;Reduced laryngeal elevation;Reduced airway/laryngeal closure;Reduced tongue base retraction;Penetration/Aspiration before swallow;Trace aspiration Penetration/Aspiration details (thin cup): Material enters airway, passes BELOW cords without attempt by patient to eject out (silent aspiration);Material enters airway, remains ABOVE vocal cords then ejected out Pharyngeal - Thin Straw: Premature spillage to pyriform sinuses;Reduced pharyngeal peristalsis;Reduced anterior laryngeal mobility;Reduced  epiglottic inversion;Reduced laryngeal elevation;Reduced airway/laryngeal closure;Reduced tongue base retraction;Penetration/Aspiration before swallow;Trace aspiration Penetration/Aspiration details (thin straw): Material enters airway, passes BELOW cords without attempt by patient to eject out (silent aspiration) Pharyngeal - Solids Pharyngeal - Puree: Reduced pharyngeal peristalsis;Reduced epiglottic inversion;Reduced anterior laryngeal mobility;Reduced laryngeal elevation;Reduced airway/laryngeal closure;Reduced tongue base retraction Pharyngeal - Mechanical Soft: Reduced pharyngeal peristalsis;Reduced epiglottic inversion;Reduced anterior laryngeal mobility;Reduced laryngeal elevation;Reduced airway/laryngeal closure;Reduced tongue base retraction Pharyngeal - Pill: Reduced pharyngeal peristalsis;Reduced epiglottic inversion;Reduced anterior laryngeal mobility;Reduced laryngeal elevation;Reduced airway/laryngeal closure;Reduced tongue base retraction  Cervical Esophageal Phase    GO    Cervical Esophageal Phase Cervical Esophageal Phase:  (Unable to complete esophageal sweep due to pts size. )        Harlon Ditty, MA CCC-SLP [phone]  Cleven Jansma, Riley Nearing 12/25/2011, 12:01 PM

## 2011-12-25 NOTE — Progress Notes (Signed)
Pt continues w/ complex medical issues.  Not ready for CIR.  Will follow. [phone]

## 2011-12-25 NOTE — Progress Notes (Signed)
OT Cancellation Note  Patient Details Name: Brendan Scott MRN: [id number] DOB: 1945-10-17   Cancelled Treatment:    Reason Eval/Treat Not Completed: Medical issues which prohibited therapy: pt currently having seizures- will hold OT today. Thank you. Glendale Chard, OTR/L Pager: [phone] 12/25/2011    Fahed Morten 12/25/2011, 3:37 PM

## 2011-12-25 NOTE — Progress Notes (Signed)
PT Cancellation Note  Patient Details Name: Brendan Scott MRN: [id number] DOB: [birth year]   Cancelled Treatment:    Reason Eval/Treat Not Completed: Medical issues which prohibited therapy (current seizure activities).  Pt currently having seizures with hold PT today.  Thanks!!   Eufemia Prindle 12/25/2011, 1:57 PM Jake Shark, PT DPT [phone]

## 2011-12-25 NOTE — Progress Notes (Signed)
ANTICOAGULATION CONSULT NOTE - Initial Consult  Pharmacy Consult for Coumadin Indication: atrial fibrillation  No Known Allergies  Patient Measurements: Height: 5\' 6"  (167.6 cm) Weight: 216 lb 0.8 oz (98 kg) IBW/kg (Calculated) : 63.8   Vital Signs: Temp: 98.6 F (37 C) (11/25 1200) Temp src: Oral (11/25 1200) BP: 170/90 mmHg (11/25 1330) Pulse Rate: 100  (11/25 1330)  Labs:  Basename 12/24/11 1126  HGB 13.3  HCT 40.0  PLT 252  APTT --  LABPROT --  INR --  HEPARINUNFRC --  CREATININE 0.95  CKTOTAL --  CKMB --  TROPONINI --    Estimated Creatinine Clearance: 83.8 ml/min (by C-G formula based on Cr of 0.95).   Medical History: Past Medical History  Diagnosis Date  . Arthritis   . Diabetes mellitus   . Atrial fibrillation    Assessment: 66 yom with a history of afib to start coumadin per pharmacy. CBC wnl and platelets good. No bleeding noted. Warfarin score = 7. Baseline INR 0.98.   Goal of Therapy:  INR 2-3 Monitor platelets by anticoagulation protocol: Yes   Plan:  Coumadin 7.5mg  po x 1 dose tonight Daily PT/INR Coumadin book and video  Thank you,  Brett Fairy, PharmD 12/25/2011 3:05 PM

## 2011-12-25 NOTE — Consult Note (Signed)
PULMONARY  / CRITICAL CARE MEDICINE  Name: TRAVER FAZZINI MRN: [id number] DOB: Mar 05, 1945    LOS: 10  REFERRING PROVIDER:  Dr. Danae Orleans  CHIEF COMPLAINT:  Increased Secretions  BRIEF PATIENT DESCRIPTION: 66 y/o AAM with PMH of arthritis, DM, Afib who was admitted to [facility] 11/15 as a CODE STROKE.   CT scan of the head without contrast showed evidence of a fairly large mass lesion involving the left frontotemporal region as well as possible increased density of the right middle cerebral artery proximally, and possibly early MCA territory infarction.  MRI confirmed large R MCA infarct.  On 11/24 patient was noted to have increased secretions and PCCM consulted.     INTERVAL HISTORY:  No distress. No new complaints. Strong cough    VITAL SIGNS: Temp:  [98.4 F (36.9 C)-99 F (37.2 C)] 98.4 F (36.9 C) (11/25 0800) Pulse Rate:  [73-101] 94  (11/25 0800) Resp:  [27-34] 27  (11/25 0800) BP: (133-163)/(63-82) 162/82 mmHg (11/25 0800) SpO2:  [95 %-99 %] 99 % (11/25 0800) Weight:  [98 kg (216 lb 0.8 oz)] 98 kg (216 lb 0.8 oz) (11/25 0417)  PHYSICAL EXAMINATION: General:  NAD on RA Neuro:  Calm, + F/C, diffusely weak HEENT:  Mm pink/moist, no JVD Cardiovascular:  s1s2 IRIR Lungs:  Mild bilateral rhonchi Abdomen:  Obese/soft, bsx4 active Musculoskeletal:  No acute deformity Skin:  intact   Lab 12/24/11 1126  NA 132*  K 3.9  CL 97  CO2 27  BUN 14  CREATININE 0.95  GLUCOSE 243*    Lab 12/24/11 1126  HGB 13.3  HCT 40.0  WBC 13.6*  PLT 252   Dg Chest Port 1 View  12/24/2011  *RADIOLOGY REPORT*  Clinical Data: Possible pneumonia, diabetes, antral fibrillation  PORTABLE CHEST - 1 VIEW  Comparison: 12/15/2011  Findings: Monitor leads overlie the chest.  NG tube courses below the hemidiaphragms with the tip not visualized.  Slight rotation to the left accounting for prominent hilar contours.  Mild cardiomegaly without CHF, pneumonia, effusion, collapse, consolidation, or pneumothorax.   IMPRESSION: Cardiomegaly without CHF or pneumonia   Original Report Authenticated By: Judie Petit. Shick, M.D.     ASSESSMENT / PLAN:  Acute Respiratory Failure, resolved Marginal airway hygiene - appears improved No compelling evidence of PNA  Cont current Rx/mgmt PCCM will sign off. Please call if we can be of further assistance    Billy Fischer, MD ; [facility] [phone].  After 5:30 PM or weekends, call [phone]

## 2011-12-25 NOTE — Progress Notes (Signed)
Stroke Team Progress Note  HISTORY Brendan Scott is an 66 y.o. male who was last seen normal at 14:30 on 12/14/2101 when he left for work. Patient never showed up to work. Family then found him in driveway unresponsive and called EMS. EMS arrived to find patient standing in driveway slightly confused, left facial droop and dysarthria. Code stroke was called and patient was brought to [facility] hospital. Patient remained with left facial droop, right gaze preference, unable to cross midline, dysarthria and left hemianopsia. CT scan of the head without contrast showed evidence of a fairly large mass lesion involving the left frontotemporal region as well as possible increased density of the right middle cerebral artery proximally, and possibly early MCA territory infarction. Mass lesion on the left was thought to be incidental and likely a meningioma. As such, he was not considered a candidate for intravenous thrombolytic therapy with TPA. Patient was subsequently taken to Interventional Radiology for cerebral arteriogram was performed which showed no signs of occlusion of the right middle cerebral artery nor right internal carotid artery. There was mass effect involving the left middle cerebral artery with reduced diameter and straightening. Chronic permanent tumor blush was also seen. NIH stroke score was 8, including visual field defect and neglect of his left side as well as facial weakness and extremity weakness, and slurred speech. Modified Rankin score was 3. Patient was taking aspirin 81 mg per day prior to admission. He was admitted to the neuro ICU for further evaluation and treatment.  SUBJECTIVE Wife at bedside. seizures remained stopped. Passed swallow eval this am.  OBJECTIVE Most recent Vital Signs: Filed Vitals:   12/24/11 1957 12/24/11 2325 12/25/11 0417 12/25/11 0800  BP: 133/63 151/72 163/81 162/82  Pulse: 73 97 97 94  Temp: 98.7 F (37.1 C) 98.6 F (37 C) 98.5 F (36.9 C) 98.4 F (36.9 C)   TempSrc: Oral Axillary Oral Oral  Resp:  29 34 27  Height:      Weight:   98 kg (216 lb 0.8 oz)   SpO2: 95% 98% 97% 99%    IV Fluid Intake:      . sodium chloride 75 mL/hr at 12/25/11 1135  . feeding supplement (JEVITY 1.2 CAL) 1,000 mL (12/25/11 0057)   MEDICATIONS     . antiseptic oral rinse  15 mL Mouth Rinse BID  . aspirin  81 mg Oral Daily  . clonazePAM  0.5 mg Oral Daily  . clopidogrel  75 mg Oral Q breakfast  . enoxaparin (LOVENOX) injection  40 mg Subcutaneous Q24H  . insulin aspart  0-9 Units Subcutaneous Q4H  . lacosamide (VIMPAT) IV  300 mg Intravenous Q12H  . levetiracetam  2,000 mg Intravenous Q12H  . lisinopril  5 mg Oral Daily  . LORazepam  2 mg Intravenous Once  . phenytoin (DILANTIN) IV  100 mg Intravenous Q8H  . senna-docusate  1 tablet Oral QHS  . simvastatin  20 mg Oral q1800  . [DISCONTINUED] guaiFENesin  1,200 mg Oral BID  . [DISCONTINUED] guaiFENesin  600 mg Oral Q6H   PRN:  fentaNYL, labetalol, midazolam, RESOURCE THICKENUP CLEAR  Diet:  Dysphagia 1 pudding thick liquids Activity:  Up with assistance to chair DVT Prophylaxis:  Lovenox 40 mg sq daily   CLINICALLY SIGNIFICANT STUDIES Basic Metabolic Panel:   Lab 12/24/11 1126  NA 132*  K 3.9  CL 97  CO2 27  GLUCOSE 243*  BUN 14  CREATININE 0.95  CALCIUM 9.5  MG --  PHOS --  Liver Function Tests:   Lab 12/24/11 2018  AST --  ALT --  ALKPHOS --  BILITOT --  PROT --  ALBUMIN 2.5*   CBC:   Lab 12/24/11 1126  WBC 13.6*  NEUTROABS 10.6*  HGB 13.3  HCT 40.0  MCV 80.3  PLT 252   Coagulation:  No results found for this basename: LABPROT:4,INR:4 in the last 168 hours Cardiac Enzymes:  No results found for this basename: CKTOTAL:3,CKMB:3,CKMBINDEX:3,TROPONINI:3 in the last 168 hours Urinalysis:   Lab 12/24/11 1200 12/21/11 1028  COLORURINE YELLOW YELLOW  LABSPEC 1.016 1.014  PHURINE 8.5* 8.0  GLUCOSEU 100* NEGATIVE  HGBUR NEGATIVE NEGATIVE  BILIRUBINUR NEGATIVE NEGATIVE   KETONESUR NEGATIVE NEGATIVE  PROTEINUR NEGATIVE NEGATIVE  UROBILINOGEN 1.0 1.0  NITRITE NEGATIVE NEGATIVE  LEUKOCYTESUR NEGATIVE NEGATIVE   Urine Culture 12/24/2011 >100k proteus mirabilis  Lipid Panel    Component Value Date/Time   CHOL 184 12/16/2011 0430   TRIG 51 12/16/2011 0430   HDL 60 12/16/2011 0430   CHOLHDL 3.1 12/16/2011 0430   VLDL 10 12/16/2011 0430   LDLCALC 114* 12/16/2011 0430   HgbA1C  Lab Results  Component Value Date   HGBA1C 6.9* 12/16/2011    Urine Drug Screen:   No results found for this basename: labopia,  cocainscrnur,  labbenz,  amphetmu,  thcu,  labbarb    Alcohol Level: No results found for this basename: ETH:2 in the last 168 hours  CT of the brain   12/20/2011  Evolving cytotoxic edema in the right frontotemporal region. Early hemorrhagic transformation right posterior frontal cortex. No significant mass effect. 3 mm right-to-left shift. 12/16/2011  Exam is markedly motion degraded.  Findings raise possibility of progressive right hemispheric infarct.  Limited for detection of intracranial hemorrhage.  Left anterior left temporal lobe/ clinoid/cavernous sinus region hyperdense lesions suggestive of a meningioma.  This can be evaluated with contrast enhanced MR.  12/15/2011  Right middle cerebral artery slightly dense.  There is minimal hypodensity right opercular region. This may reflect changes of early acute infarct.  Large hyperdense mass centered at the left clinoid with extension into the left middle cranial fossa, left cavernous sinus, left subfrontal region and orbital apex region having appearance suggestive of a meningioma with bony remodeling.  This can be assessed with contrast enhanced MR.   Hypodensity adjacent left frontal lobe may reflect vasogenic edema.  No intracranial hemorrhage.  Prior right mastoidectomy.  Mild thickening along the peripheral aspect of the mastoidectomy site.  Partial opacification of residual right mastoid air  cells.   Cerebral Angiogram 12/14/2101 S/P 4 vessel cerebral arteriogram. RT CFA approach. 1. No occlusions ,stenosis dissections or intraluminal filling defects seen . 2. Venous outflow WNLs.  MRI of the brain  12/16/2011   Large right middle cerebral artery distribution non hemorrhagic infarct.  Large left sphenoid wing mass consistent with meningioma with invasion    MRA of the brain  12/16/2011    Circumferential narrowing (caused by a meningioma) of the supraclinoid aspect of the left internal carotid artery, the carotid terminus, the A1 segment of the left anterior cerebral artery and slightly less so the M1 segment of the left middle cerebral artery (which is elevated by the mass).  Decreased caliber/number of visualized left middle cerebral artery branch vessels may indicate flow limiting stenosis by the meningioma.  Right internal carotid artery/carotid terminus without significant narrowing.  No significant narrowing of the M1 segment or A1 segment of the right middle cerebral artery/anterior cerebral artery respectively.  Right vertebral artery is dominant.  Moderate to marked tandem stenosis left vertebral artery.  High-grade stenosis proximal basilar artery.   2D Echocardiogram  EF 55-60% with no source of embolus. Patient in atrial fibrillation   Carotid Doppler  See cerebral angio  EEG 11/17/2013This is a normal EEG recording during wakefulness. No evidence of an epileptic disorder was demonstrated. Rhythmic muscle activity consistent with tremor was seen frequently throughout the  record, as described above 12/21/2011-This is an EEG that is characterized by slow activity. This likely represents drowse. No focal slowing is noted. No epileptiform activity is noted.  CXR   12/24/2011 Cardiomegaly without CHF or pneumonia 12/15/2011   Mild cardiac enlargement.  No active infiltrates or failure.   EKG  ATRIAL FIBRILLATION, V-RATE 68- 92 ~ var'd rate, irreg atrial activity  LATERAL  INFARCT, OLD ~ Q>80mS, abnormal ST-T, I aVL V5-6  ABNRM R PROG, CONSIDER ASMI OR LEAD PLACEMENT ~ Q >47mS, diminished R, V2  Therapy Recommendations PT - CIR; OT - CIR  GENERAL EXAM: SLEEPING. DOES NOT FULLY WAKE UP TO STIM. NO FACIAL OR ARM TWITCHING. CARDIOVASCULAR: Regular rate and rhythm, no murmurs, no carotid bruits PULMONARY: COARSE UPPER AIRWAY BREATH SOUNDS, RHONCHI, INTERMITTENT COUGHING NEUROLOGIC: MENTAL STATUS: SLEEPING. BRIEFLY OPENS EYES TO STIM. CRANIAL NERVE: Pupils equal and reactive to light, DECR LEFT LOWER FACIAL STRENGTH MOTOR: RUE/RLE SPONT. LUE/LLE WITHDRAWAL TO STIM.   ASSESSMENT Brendan Scott is a 66 y.o. male presenting with confusion, left facial droop and dysarthria. MRI confirms a large right middle cerebral artery distribution non hemorrhagic infarct. 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  Newly identified left frontal and anterior temporal meningioma. Infarct likely embolic infarct given atrial fibrillation. On aspirin 81 mg orally every day prior to admission. Now on aspirin 81 mg and Plavix 75 mg every day for secondary stroke prevention.   Atrial fibrillation Diabetes, HgbA1c 6.9 Hyperlipidemia, LDL 114, not on statin PTA, goal LDL < 70 for diabetics. On fish oil PTA. Now on zocor Obesity class 2, Body mass index is 34.87 kg/(m^2). Focal status epilepticus with ongoing simple partial seizures involving left side of his face, EEG without epileptiform discharges, on dilantin, vimpat, keppra, and klonopin Dysphagia secondary to stroke/seizure, passed swallow eval this am Hypertension Cytotoxic edema Leukocytosis, increasing. CXR without infections. Urinalysis normal 11/21, Urine Culture >100k proteus mirabilis. ? Aspiration as well New hiccups  Hospital day # 10  TREATMENT/PLAN  Monitor WBC  Change to coumadin as now able to take diet  CONTINUE  dilantin, vimpat, keppra, and klonopin for seizure control  Thorazine trial for hiccups  D1 pudding thick liquid diet  Transfer to the floor  Change IV meds to po  Rocephin for UTI  Rehab candidate at time of discharge   Annie Main, MSN, RN, ANVP-BC, ANP-BC, GNP-BC [facility] Pager: [phone] 12/25/2011 12:22 PM  I have personally examined this patient, reviewed pertinent data, developed plan of care and discussed with his wife and answered questions.  Delia Heady, MD Medical Director [facility] Pager: [phone] 12/25/2011 3:39 PM

## 2011-12-25 NOTE — Progress Notes (Signed)
Patient with recurrent seizures, resuming this afternoon, involves the face and arm. Per RN, pt now with decreased responsiveness, lethargic. New hiccups noted this am are now stopped.   Will discontinue thorazine. Given ativan 1 mg IV now for seizures. Increase keppra to 2500mg  bid.   Hold changing IV medications to po at this time.  Discussed with Dr. Pearlean Brownie.  Annie Main, MSN, RN, ANVP-BC, ANP-BC, [facility] Pager: [phone] 12/25/2011 2:38 PM

## 2011-12-25 NOTE — Progress Notes (Signed)
Inpatient Diabetes Program Recommendations  AACE/ADA: New Consensus Statement on Inpatient Glycemic Control (2013)  Target Ranges:  Prepandial:   less than 140 mg/dL      Peak postprandial:   less than 180 mg/dL (1-2 hours)      Critically ill patients:  140 - 180 mg/dL  Results for JAKHARI, SPACE (MRN [id number]) as of 12/25/2011 13:35  Ref. Range 12/24/2011 19:50 12/25/2011 00:09 12/25/2011 03:56 12/25/2011 08:12 12/25/2011 11:32  Glucose-Capillary Latest Range: 70-99 mg/dL 409 (H) 811 (H) 914 (H) 249 (H) 247 (H)    Inpatient Diabetes Program Recommendations Insulin - Meal Coverage: add Novolog 4 units Q 4 hours to cover carbs in tube feeding Will follow. Thank you  Piedad Climes RN,BSN,CDE Inpatient Diabetes Coordinator [phone] (team pager)

## 2011-12-25 NOTE — Progress Notes (Signed)
Nutrition Follow-up  Intervention:  Ensure Pudding PO QID to maximize oral intake, each supplement provides 170 kcal and 4 grams of protein.   Assessment:   SLP following for dysphagia.  Diet just advanced to Dysphagia 1 without liquids this morning.  Noted patient with recurrent seizures, decreased responsiveness this afternoon.  TF was discontinued today.  Enteral feeding tube was removed for swallow evaluation earlier today and is not going to be replaced.  RN reports that patient did not eat lunch due to the recurrence of his seizures and the requirement for Ativan.  RN is hopeful that patient will be alert enough to eat supper tonight.  Diet Order:  Dysphagia 1, no liquids  Meds: Scheduled Meds:   . antiseptic oral rinse  15 mL Mouth Rinse BID  . cefTRIAXone (ROCEPHIN)  IV  1 g Intravenous Q24H  . clonazePAM  0.5 mg Oral Q12H  . enoxaparin (LOVENOX) injection  40 mg Subcutaneous Q24H  . insulin aspart  0-9 Units Subcutaneous Q4H  . lacosamide (VIMPAT) IV  300 mg Intravenous Q12H  . levetiracetam  2,500 mg Intravenous Q12H  . lisinopril  5 mg Oral Daily  . [COMPLETED] LORazepam  1 mg Intravenous Once  . LORazepam  2 mg Intravenous Once  . phenytoin (DILANTIN) IV  100 mg Intravenous Q8H  . senna-docusate  1 tablet Oral QHS  . simvastatin  20 mg Oral q1800  . [DISCONTINUED] aspirin  81 mg Oral Daily  . [DISCONTINUED] chlorproMAZINE (THORAZINE) IV  12.5 mg Intravenous Once  . [DISCONTINUED] clonazePAM  0.5 mg Oral Daily  . [DISCONTINUED] clonazePAM  0.5 mg Oral QHS  . [DISCONTINUED] clopidogrel  75 mg Oral Q breakfast  . [DISCONTINUED] guaiFENesin  1,200 mg Oral BID  . [DISCONTINUED] guaiFENesin  600 mg Oral Q6H  . [DISCONTINUED] levetiracetam  2,000 mg Intravenous Q12H   Continuous Infusions:   . sodium chloride 75 mL/hr at 12/25/11 1400  . [DISCONTINUED] feeding supplement (JEVITY 1.2 CAL) 1,000 mL (12/25/11 0057)   PRN Meds:.fentaNYL, labetalol, midazolam, RESOURCE  THICKENUP CLEAR   CMP     Component Value Date/Time   NA 132* 12/24/2011 1126   K 3.9 12/24/2011 1126   CL 97 12/24/2011 1126   CO2 27 12/24/2011 1126   GLUCOSE 243* 12/24/2011 1126   BUN 14 12/24/2011 1126   CREATININE 0.95 12/24/2011 1126   CALCIUM 9.5 12/24/2011 1126   PROT 6.7 12/17/2011 0448   ALBUMIN 2.5* 12/24/2011 2018   AST 20 12/17/2011 0448   ALT 22 12/17/2011 0448   ALKPHOS 65 12/17/2011 0448   BILITOT 0.8 12/17/2011 0448   GFRNONAA 85* 12/24/2011 1126   GFRAA >90 12/24/2011 1126    CBG (last 3)   Basename 12/25/11 1132 12/25/11 0812 12/25/11 0356  GLUCAP 247* 249* 220*     Intake/Output Summary (Last 24 hours) at 12/25/11 1503 Last data filed at 12/25/11 1400  Gross per 24 hour  Intake   3117 ml  Output   2951 ml  Net    166 ml    Weight Status:  98 kg up slightly from 96.3 kg on 11/20  Estimated needs:  1950-2150 kcals, 90-105 gm protein, >1.9 L fluid daily  Nutrition Dx:  Inadequate oral intake now r/t dysphagia AEB poor intake of Dysphagia 1, no liquids diet.  Goal:  Pt to meet >/= 90% of their estimated nutrition needs, met.  Monitor:  PO intake, TF tolerance, labs, weight trend.   Joaquin Courts, RD, LDN, CNSC Pager#  [phone] After Hours Pager# [phone]

## 2011-12-26 LAB — GLUCOSE, CAPILLARY
Glucose-Capillary: 139 mg/dL — ABNORMAL HIGH (ref 70–99)
Glucose-Capillary: 155 mg/dL — ABNORMAL HIGH (ref 70–99)
Glucose-Capillary: 180 mg/dL — ABNORMAL HIGH (ref 70–99)

## 2011-12-26 LAB — PROTIME-INR
INR: 1.16 (ref 0.00–1.49)
Prothrombin Time: 14.6 seconds (ref 11.6–15.2)

## 2011-12-26 LAB — URINE CULTURE

## 2011-12-26 MED ORDER — WARFARIN SODIUM 7.5 MG PO TABS
7.5000 mg | ORAL_TABLET | Freq: Once | ORAL | Status: AC
  Administered 2011-12-26: 7.5 mg via ORAL
  Filled 2011-12-26: qty 1

## 2011-12-26 MED ORDER — INSULIN ASPART 100 UNIT/ML ~~LOC~~ SOLN
0.0000 [IU] | Freq: Three times a day (TID) | SUBCUTANEOUS | Status: DC
  Administered 2011-12-26: 2 [IU] via SUBCUTANEOUS
  Administered 2011-12-27: 5 [IU] via SUBCUTANEOUS

## 2011-12-26 MED ORDER — CLONAZEPAM 0.5 MG PO TABS: 0.5000 mg | ORAL_TABLET | Freq: Every day | ORAL | Status: DC

## 2011-12-26 MED ORDER — PHENYTOIN 50 MG PO CHEW
100.0000 mg | CHEWABLE_TABLET | Freq: Three times a day (TID) | ORAL | Status: DC
  Administered 2011-12-26 – 2011-12-27 (×3): 100 mg via ORAL
  Filled 2011-12-26 (×4): qty 2

## 2011-12-26 MED ORDER — LEVETIRACETAM 750 MG PO TABS
2500.0000 mg | ORAL_TABLET | Freq: Two times a day (BID) | ORAL | Status: DC
  Administered 2011-12-26 – 2011-12-27 (×2): 2500 mg via ORAL
  Filled 2011-12-26 (×3): qty 1

## 2011-12-26 NOTE — Progress Notes (Signed)
Physical Therapy Treatment Patient Details Name: Brendan Scott MRN: [id number] DOB: [birth year] Today's Date: 12/26/2011 Time: 7829-5621 PT Time Calculation (min): 39 min  PT Assessment / Plan / Recommendation Comments on Treatment Session  Pt able to transfer with less assistance and more alert this session.  Pt continues to have noticeable tone in left LE in extension.  Pt able to take a few steps during transfer however continues to need constant cues.  Pt easily distracted with sustained attention.    Follow Up Recommendations  CIR;Supervision/Assistance - 24 hour     Does the patient have the potential to tolerate intense rehabilitation     Barriers to Discharge        Equipment Recommendations  3 in 1 bedside comode    Recommendations for Other Services Rehab consult  Frequency Min 4X/week   Plan Frequency remains appropriate;Discharge plan needs to be updated    Precautions / Restrictions Precautions Precautions: Fall (Simultaneous filing. User may not have seen previous data.) Restrictions Weight Bearing Restrictions: No (Simultaneous filing. User may not have seen previous data.)   Pertinent Vitals/Pain No c/o pain    Mobility  Bed Mobility Bed Mobility: Supine to Sit Supine to Sit: 4: Min assist Sitting - Scoot to Edge of Bed: 2: Max assist Details for Bed Mobility Assistance: (A) to elevate trunk OOB.  Pt able to activate LE OOB with manual cues.   (A) to scoot hips EOB with cues for technique Transfers Transfers: Sit to Stand;Stand to Sit;Stand Pivot Transfers Sit to Stand: 1: +2 Total assist;From bed;From chair/3-in-1 Sit to Stand: Patient Percentage: 40% Stand to Sit: 1: +2 Total assist;To bed;To chair/3-in-1 Stand to Sit: Patient Percentage: 40% Stand Pivot Transfers: 1: +2 Total assist Stand Pivot Transfers: Patient Percentage: 60% Details for Transfer Assistance: +2 (A) to initiate transfer with cues for hand placement.  Pt continues to keep Left LE in  extension with noticeable tone.  Performed sit<-> stand x 3 during peri care each time pt needed manual cues to slide left LE back during transfer due to tone.  Pt needed +2 (A) to maintain upright posture with manual cues to advance LE especially left LE during transfer.  Ambulation/Gait Ambulation/Gait Assistance: Not tested (comment) Modified Rankin (Stroke Patients Only) Pre-Morbid Rankin Score: No symptoms Modified Rankin: Severe disability    Exercises     PT Diagnosis:    PT Problem List:   PT Treatment Interventions:     PT Goals Acute Rehab PT Goals PT Goal Formulation: With patient Time For Goal Achievement: 12/30/11 Potential to Achieve Goals: Fair Pt will Roll Supine to Right Side: with modified independence PT Goal: Rolling Supine to Right Side - Progress: Progressing toward goal Pt will Roll Supine to Left Side: with modified independence PT Goal: Rolling Supine to Left Side - Progress: Progressing toward goal Pt will go Supine/Side to Sit: with supervision PT Goal: Supine/Side to Sit - Progress: Progressing toward goal Pt will go Sit to Supine/Side: with supervision PT Goal: Sit to Supine/Side - Progress: Progressing toward goal Pt will go Sit to Stand: with min assist PT Goal: Sit to Stand - Progress: Progressing toward goal Pt will go Stand to Sit: with min assist PT Goal: Stand to Sit - Progress: Progressing toward goal Pt will Transfer Bed to Chair/Chair to Bed: with mod assist PT Transfer Goal: Bed to Chair/Chair to Bed - Progress: Progressing toward goal  Visit Information  Last PT Received On: 12/26/11 Assistance Needed: +2 (Simultaneous filing. User  may not have seen previous data.)    Subjective Data  Subjective: "Window" (Whats on the wall?) Patient Stated Goal: unable to set   Cognition  Overall Cognitive Status: Impaired Area of Impairment: Attention;Safety/judgement;Problem solving Arousal/Alertness: Awake/alert Current Attention Level:  Sustained (~10 seconds) Safety/Judgement: Decreased awareness of safety precautions;Decreased safety judgement for tasks assessed Safety/Judgement - Other Comments: decreased awareness of left sided weakness Problem Solving: Pt slow to process and needs extra time to complete task. Cognition - Other Comments: Pt easily distracted and needs cues for redirection to task.      Balance  Balance Balance Assessed: Yes Static Standing Balance Static Standing - Balance Support: Bilateral upper extremity supported;During functional activity Static Standing - Level of Assistance: 1: +2 Total assist Static Standing - Comment/# of Minutes: ~ 5 minutes x 2 with total (A) +2 to maintain upright posture and midline to prevent right lean.    End of Session PT - End of Session Equipment Utilized During Treatment: Gait belt Activity Tolerance: Patient tolerated treatment well Patient left: in chair;with call bell/phone within reach Nurse Communication: Mobility status;Other (comment)   GP     Jye Fariss 12/26/2011, 2:50 PM Jake Shark, PT DPT [phone]

## 2011-12-26 NOTE — Progress Notes (Signed)
Stroke Team Progress Note  HISTORY Brendan Scott is an 66 y.o. male who was last seen normal at 14:30 on 12/14/2101 when he left for work. Patient never showed up to work. Family then found him in driveway unresponsive and called EMS. EMS arrived to find patient standing in driveway slightly confused, left facial droop and dysarthria. Code stroke was called and patient was brought to [facility] hospital. Patient remained with left facial droop, right gaze preference, unable to cross midline, dysarthria and left hemianopsia. CT scan of the head without contrast showed evidence of a fairly large mass lesion involving the left frontotemporal region as well as possible increased density of the right middle cerebral artery proximally, and possibly early MCA territory infarction. Mass lesion on the left was thought to be incidental and likely a meningioma. As such, he was not considered a candidate for intravenous thrombolytic therapy with TPA. Patient was subsequently taken to Interventional Radiology for cerebral arteriogram was performed which showed no signs of occlusion of the right middle cerebral artery nor right internal carotid artery. There was mass effect involving the left middle cerebral artery with reduced diameter and straightening. Chronic permanent tumor blush was also seen. NIH stroke score was 8, including visual field defect and neglect of his left side as well as facial weakness and extremity weakness, and slurred speech. Modified Rankin score was 3. Patient was taking aspirin 81 mg per day prior to admission. He was admitted to the neuro ICU for further evaluation and treatment.  SUBJECTIVE Wife and 2 brothers at bedside. Had recurrent seizures yesterday afternoon. They have now resolved. Per wife, only lasted 6 mins.  OBJECTIVE Most recent Vital Signs: Filed Vitals:   12/25/11 2300 12/26/11 0300 12/26/11 0600 12/26/11 0800  BP: 109/71 96/68  152/71  Pulse: 70 93  88  Temp: 98.3 F (36.8 C)  98.2 F (36.8 C)  98.2 F (36.8 C)  TempSrc: Oral Oral  Oral  Resp: 21 20  24   Height:      Weight:   95.5 kg (210 lb 8.6 oz)   SpO2: 100% 99%  98%    IV Fluid Intake:      . sodium chloride 75 mL/hr at 12/26/11 0800  . [DISCONTINUED] feeding supplement (JEVITY 1.2 CAL) 1,000 mL (12/25/11 0057)   MEDICATIONS     . antiseptic oral rinse  15 mL Mouth Rinse BID  . cefTRIAXone (ROCEPHIN)  IV  1 g Intravenous Q24H  . clonazePAM  0.5 mg Oral Q12H  . [COMPLETED] coumadin book   Does not apply Once  . enoxaparin (LOVENOX) injection  40 mg Subcutaneous Q24H  . feeding supplement  1 Container Oral TID PC & HS  . insulin aspart  0-9 Units Subcutaneous Q4H  . lacosamide (VIMPAT) IV  300 mg Intravenous Q12H  . levetiracetam  2,500 mg Intravenous Q12H  . lisinopril  5 mg Oral Daily  . [COMPLETED] LORazepam  1 mg Intravenous Once  . LORazepam  2 mg Intravenous Once  . phenytoin (DILANTIN) IV  100 mg Intravenous Q8H  . senna-docusate  1 tablet Oral QHS  . simvastatin  20 mg Oral q1800  . [COMPLETED] warfarin  7.5 mg Oral ONCE-1800  . warfarin   Does not apply Once  . Warfarin - Pharmacist Dosing Inpatient   Does not apply q1800  . [DISCONTINUED] aspirin  81 mg Oral Daily  . [DISCONTINUED] chlorproMAZINE (THORAZINE) IV  12.5 mg Intravenous Once  . [DISCONTINUED] clonazePAM  0.5 mg Oral  Daily  . [DISCONTINUED] clonazePAM  0.5 mg Oral QHS  . [DISCONTINUED] clopidogrel  75 mg Oral Q breakfast  . [DISCONTINUED] guaiFENesin  600 mg Oral Q6H  . [DISCONTINUED] levetiracetam  2,000 mg Intravenous Q12H   PRN:  fentaNYL, labetalol, midazolam, RESOURCE THICKENUP CLEAR  Diet:  Dysphagia 1 pudding thick liquids Activity:  Up with assistance to chair DVT Prophylaxis:  Lovenox 40 mg sq daily   CLINICALLY SIGNIFICANT STUDIES Basic Metabolic Panel:   Lab 12/24/11 1126  NA 132*  K 3.9  CL 97  CO2 27  GLUCOSE 243*  BUN 14  CREATININE 0.95  CALCIUM 9.5  MG --  PHOS --   Liver Function  Tests:   Lab 12/24/11 2018  AST --  ALT --  ALKPHOS --  BILITOT --  PROT --  ALBUMIN 2.5*   CBC:   Lab 12/24/11 1126  WBC 13.6*  NEUTROABS 10.6*  HGB 13.3  HCT 40.0  MCV 80.3  PLT 252   Coagulation:   Lab 12/26/11 0453  LABPROT 14.6  INR 1.16   Cardiac Enzymes:  No results found for this basename: CKTOTAL:3,CKMB:3,CKMBINDEX:3,TROPONINI:3 in the last 168 hours Urinalysis:   Lab 12/24/11 1200 12/21/11 1028  COLORURINE YELLOW YELLOW  LABSPEC 1.016 1.014  PHURINE 8.5* 8.0  GLUCOSEU 100* NEGATIVE  HGBUR NEGATIVE NEGATIVE  BILIRUBINUR NEGATIVE NEGATIVE  KETONESUR NEGATIVE NEGATIVE  PROTEINUR NEGATIVE NEGATIVE  UROBILINOGEN 1.0 1.0  NITRITE NEGATIVE NEGATIVE  LEUKOCYTESUR NEGATIVE NEGATIVE   Urine Culture 12/24/2011 >100k proteus mirabilis  Lipid Panel    Component Value Date/Time   CHOL 184 12/16/2011 0430   TRIG 51 12/16/2011 0430   HDL 60 12/16/2011 0430   CHOLHDL 3.1 12/16/2011 0430   VLDL 10 12/16/2011 0430   LDLCALC 114* 12/16/2011 0430   HgbA1C  Lab Results  Component Value Date   HGBA1C 6.9* 12/16/2011    Urine Drug Screen:   No results found for this basename: labopia,  cocainscrnur,  labbenz,  amphetmu,  thcu,  labbarb    Alcohol Level: No results found for this basename: ETH:2 in the last 168 hours  CT of the brain   12/20/2011  Evolving cytotoxic edema in the right frontotemporal region. Early hemorrhagic transformation right posterior frontal cortex. No significant mass effect. 3 mm right-to-left shift. 12/16/2011  Exam is markedly motion degraded.  Findings raise possibility of progressive right hemispheric infarct.  Limited for detection of intracranial hemorrhage.  Left anterior left temporal lobe/ clinoid/cavernous sinus region hyperdense lesions suggestive of a meningioma.  This can be evaluated with contrast enhanced MR.  12/15/2011  Right middle cerebral artery slightly dense.  There is minimal hypodensity right opercular region. This  may reflect changes of early acute infarct.  Large hyperdense mass centered at the left clinoid with extension into the left middle cranial fossa, left cavernous sinus, left subfrontal region and orbital apex region having appearance suggestive of a meningioma with bony remodeling.  This can be assessed with contrast enhanced MR.   Hypodensity adjacent left frontal lobe may reflect vasogenic edema.  No intracranial hemorrhage.  Prior right mastoidectomy.  Mild thickening along the peripheral aspect of the mastoidectomy site.  Partial opacification of residual right mastoid air cells.   Cerebral Angiogram 12/14/2101 S/P 4 vessel cerebral arteriogram. RT CFA approach. 1. No occlusions ,stenosis dissections or intraluminal filling defects seen . 2. Venous outflow WNLs.  MRI of the brain  12/16/2011   Large right middle cerebral artery distribution non hemorrhagic infarct.  Large left  sphenoid wing mass consistent with meningioma with invasion    MRA of the brain  12/16/2011    Circumferential narrowing (caused by a meningioma) of the supraclinoid aspect of the left internal carotid artery, the carotid terminus, the A1 segment of the left anterior cerebral artery and slightly less so the M1 segment of the left middle cerebral artery (which is elevated by the mass).  Decreased caliber/number of visualized left middle cerebral artery branch vessels may indicate flow limiting stenosis by the meningioma.  Right internal carotid artery/carotid terminus without significant narrowing.  No significant narrowing of the M1 segment or A1 segment of the right middle cerebral artery/anterior cerebral artery respectively.  Right vertebral artery is dominant.  Moderate to marked tandem stenosis left vertebral artery.  High-grade stenosis proximal basilar artery.   2D Echocardiogram  EF 55-60% with no source of embolus. Patient in atrial fibrillation   Carotid Doppler  See cerebral angio  EEG 11/17/2013This is a normal EEG  recording during wakefulness. No evidence of an epileptic disorder was demonstrated. Rhythmic muscle activity consistent with tremor was seen frequently throughout the  record, as described above 12/21/2011-This is an EEG that is characterized by slow activity. This likely represents drowse. No focal slowing is noted. No epileptiform activity is noted.  CXR   12/24/2011 Cardiomegaly without CHF or pneumonia 12/15/2011   Mild cardiac enlargement.  No active infiltrates or failure.   EKG  ATRIAL FIBRILLATION, V-RATE 68- 92 ~ var'd rate, irreg atrial activity  LATERAL INFARCT, OLD ~ Q>38mS, abnormal ST-T, I aVL V5-6  ABNRM R PROG, CONSIDER ASMI OR LEAD PLACEMENT ~ Q >52mS, diminished R, V2  Therapy Recommendations PT - CIR; OT - CIR  GENERAL EXAM: SLEEPING. DOES NOT FULLY WAKE UP TO STIM. NO FACIAL OR ARM TWITCHING. CARDIOVASCULAR: Regular rate and rhythm, no murmurs, no carotid bruits PULMONARY: COARSE UPPER AIRWAY BREATH SOUNDS, RHONCHI, INTERMITTENT COUGHING NEUROLOGIC: MENTAL STATUS: SLEEPING. BRIEFLY OPENS EYES TO STIM. CRANIAL NERVE: Pupils equal and reactive to light, DECR LEFT LOWER FACIAL STRENGTH MOTOR: RUE/RLE SPONT. LUE/LLE WITHDRAWAL TO STIM.   ASSESSMENT Brendan Scott is a 66 y.o. male presenting with confusion, left facial droop and dysarthria. MRI confirms a large right middle cerebral artery distribution non hemorrhagic infarct. 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  Newly identified left frontal and anterior temporal meningioma. Infarct likely embolic infarct given atrial fibrillation. On aspirin 81 mg orally every day prior to admission. Now on warfarin for secondary stroke prevention.   Atrial fibrillation Diabetes, HgbA1c 6.9 Hyperlipidemia, LDL 114, not on statin PTA, goal LDL < 70 for diabetics. On fish oil PTA. Now on zocor Obesity class 2, Body mass index is 33.98  kg/(m^2). Focal status epilepticus with ongoing simple partial seizures involving left side of his face, EEG without epileptiform discharges, on dilantin, vimpat, keppra, and klonopin. seizures stopped 12/23/11 am. Resumes 12/25/2011 x 6 min. Dysphagia secondary to stroke/seizure, passed swallow eval this am Hypertension Cytotoxic edema Leukocytosis, increasing. CXR without infections. Urinalysis normal 11/21, Urine Culture >100k proteus mirabilis. ? Aspiration as well. Rocephin D2/7 New hiccups, resolved without intervention  Hospital day # 11  TREATMENT/PLAN  Monitor WBC  Continue coumadin   CONTINUE dilantin, vimpat, keppra, and klonopin for seizure control  Klonopin reduced Sunday d/t lethargy. Will continue at hs only d/t sedating properties as long as seizures ceased  Transfer to the floor  Change IV meds to po  Rehab candidate at time of discharge   Consider resuming metformin  Annie Main, MSN, RN, ANVP-BC, ANP-BC, [facility] Pager: [phone] 12/26/2011 9:55 AM  I have personally examined this patient, reviewed pertinent data, developed plan of care and discussed with his wife and answered questions.  Delia Heady, MD Medical Director [facility] Pager: [phone] 12/26/2011 9:55 AM

## 2011-12-26 NOTE — Progress Notes (Signed)
Speech Language Pathology Dysphagia Treatment Patient Details Name: Brendan Scott MRN: [id number] DOB: 04/18/1945 Today's Date: 12/26/2011 Time: 6213-0865 SLP Time Calculation (min): 27 min  Assessment / Plan / Recommendation Clinical Impression  Facial twitching again present. SLP provided treatment for diet tolerance/upgrade as well as cognition. Moderate verbal/tactile cues for utilization of compenstaory swallow strategies, sustained arousal. Pt with frequent hard cough with nectar thick liqudis, tolerates pudding thick/puree well. Alertness has continued to wax/wane. Management of secretions appears much improved. Moderate verbal cues to redirect attention to left visual field during functional task. Max verbal/visual cues to utilize environmental cues for orientation. Recommend pt continue current diet until alertness consistently improved and signs of aspiration with nectar thick liquids are minimal.     Diet Recommendation  Continue with Current Diet: Dysphagia 1 (puree);Pudding-thick liquid    SLP Plan Continue with current plan of care   Pertinent Vitals/Pain NA   Swallowing Goals  SLP Swallowing Goals Patient will consume recommended diet without observed clinical signs of aspiration with: Moderate assistance Swallow Study Goal #1 - Progress: Progressing toward goal Patient will utilize recommended strategies during swallow to increase swallowing safety with: Moderate assistance Swallow Study Goal #2 - Progress: Progressing toward goal Goal #3: Pt will sustain attention to consumption of upgraded trials of liquid textures with minimal sings of aspiration and min verbal cues.  Swallow Study Goal #3 - Progress: Progressing toward goal  General Temperature Spikes Noted: No Patient Positioning: Upright in bed  Oral Cavity - Oral Hygiene Does patient have any of the following "at risk" factors?: Other - dysphagia;Diet - patient on thickened liquids;Oxygen therapy - cannula, mask,  simple oxygen devices Patient is AT RISK - Oral Care Protocol followed (see row info): Yes   Dysphagia Treatment Treatment focused on: Upgraded PO texture trials;Skilled observation of diet tolerance;Patient/family/caregiver education;Utilization of compensatory strategies Family/Caregiver Educated: wife Treatment Methods/Modalities: Skilled observation;Differential diagnosis Patient observed directly with PO's: Yes Type of PO's observed: Dysphagia 1 (puree);Nectar-thick liquids Feeding: Able to feed self Liquids provided via: Cup Pharyngeal Phase Signs & Symptoms: Immediate cough Type of cueing: Verbal;Tactile Amount of cueing: Moderate   GO     [name], Riley Nearing 12/26/2011, 1:33 PM

## 2011-12-26 NOTE — Progress Notes (Signed)
Report called to Tami, receiving RN on 4N.  Pt transferred to 4N via wheelchair with all belongings.  Pt's family accompanied to new room.   Roselie Awkward, RN

## 2011-12-26 NOTE — Progress Notes (Signed)
ANTICOAGULATION CONSULT NOTE - Follow Up Consult  Pharmacy Consult for Coumadin Indication: atrial fibrillation  No Known Allergies  Patient Measurements: Height: 5\' 6"  (167.6 cm) Weight: 210 lb 8.6 oz (95.5 kg) IBW/kg (Calculated) : 63.8   Vital Signs: Temp: 98.2 F (36.8 C) (11/26 0800) Temp src: Oral (11/26 0800) BP: 152/71 mmHg (11/26 0800) Pulse Rate: 88  (11/26 0800)  Labs:  Basename 12/26/11 0453 12/24/11 1126  HGB -- 13.3  HCT -- 40.0  PLT -- 252  APTT -- --  LABPROT 14.6 --  INR 1.16 --  HEPARINUNFRC -- --  CREATININE -- 0.95  CKTOTAL -- --  CKMB -- --  TROPONINI -- --    Estimated Creatinine Clearance: 82.8 ml/min (by C-G formula based on Cr of 0.95).  Assessment: Brendan Scott is a 22 yom with a history of afib admitted with stroke, presenting outside of window for TPA. Patient has a history of seizures and has had episodes in hospital as well. Recent corrected dilantin level therapeutic at 14.7 (11/24).  Coumadin per pharmacy started yesterday and patient was able to take a 7.5mg  tablet. Noted TF are off and patient is on dys 1 diet. No bleeding noted. INR 1.16.   Goal of Therapy:  INR 2-3 Monitor platelets by anticoagulation protocol: Yes   Plan:  Coumadin 7.5mg  po x 1 dose tonight at 1800 F/u PT/INR in the AM  Thank you,  Brett Fairy, PharmD, BCPS 12/26/2011 10:05 AM

## 2011-12-26 NOTE — Progress Notes (Signed)
Patient received to room; transported via [facility].  Family present at side.  Patient able to transfer to bed with one standby assist of nurse.  Patient is pleasant and appropriate.  Discussed our safety plan with him and our use of bed alarm.  Patient verbalized understanding.

## 2011-12-26 NOTE — Progress Notes (Signed)
Utilization review completed.  

## 2011-12-26 NOTE — Care Management Note (Signed)
    Page 1 of 1   12/26/2011     3:03:22 PM   CARE MANAGEMENT NOTE 12/26/2011  Patient:  Brendan Scott, Brendan Scott   Account Number:  [id number]  Date Initiated:  12/20/2011  Documentation initiated by:  Carlyle Lipa  Subjective/Objective Assessment:   Stroke with incidental finding of large tumor--await neurosurgery consult     Action/Plan:   CIR evaluating pt for admit   Anticipated DC Date:  12/23/2011   Anticipated DC Plan:  IP REHAB FACILITY      DC Planning Services  CM consult      Choice offered to / List presented to:             Status of service:  In process, will continue to follow Medicare Important Message given?   (If response is "NO", the following Medicare IM given date fields will be blank) Date Medicare IM given:   Date Additional Medicare IM given:    Discharge Disposition:    Per UR Regulation:  Reviewed for med. necessity/level of care/duration of stay  If discussed at Long Length of Stay Meetings, dates discussed:   12/26/2011    Comments:  12/26/11- 1500- Donn Pierini RN bSN [phone] Pt with CVA and cont. Sz. activity, passed MBS on 12/25/11 and started on dys.I diet. per MD notes will try to start transitioning IV Sz meds to po. Tx to acute floor from SDU. spoke with Thurston Hole from [facility] on 12/25/11- they are continuing to follow pt for possible admission to CIR when pt stable medically.

## 2011-12-26 NOTE — Progress Notes (Signed)
Occupational Therapy Treatment Patient Details Name: Brendan Scott MRN: [id number] DOB: [birth year] Today's Date: 12/26/2011 Time: 4098-1191 OT Time Calculation (min): 40 min  OT Assessment / Plan / Recommendation Comments on Treatment Session Pt much more alert and participatory this session.    Follow Up Recommendations  CIR;Supervision/Assistance - 24 hour    Barriers to Discharge       Equipment Recommendations  3 in 1 bedside comode    Recommendations for Other Services Rehab consult  Frequency     Plan Discharge plan remains appropriate    Precautions / Restrictions Precautions Precautions: Fall (Simultaneous filing. User may not have seen previous data.) Restrictions Weight Bearing Restrictions: No (Simultaneous filing. User may not have seen previous data.)   Pertinent Vitals/Pain Pt denies any pain. DOE noted with bed mobility, although pt denied any trouble breathing.    ADL  Grooming: Wash/dry hands;Minimal assistance Where Assessed - Grooming: Supported sitting Toilet Transfer: +2 Total assistance Toilet Transfer: Patient Percentage: 60% Statistician Method: Surveyor, minerals: Materials engineer and Hygiene: +2 Total assistance (+3 needed to perform hygiene, +2 for sit to stand) Toileting - Architect and Hygiene: Patient Percentage:  (pt only able to assist with standing) Where Assessed - Toileting Clothing Manipulation and Hygiene: Sit to stand from 3-in-1 or toilet Equipment Used: Gait belt Transfers/Ambulation Related to ADLs: +2total A-pt=40% sit to stand and 60% stand pivot transfer from bed to bedside to chair- pt more participatory today. Able to advance feet sideways with assist to move RLE.  ADL Comments: More alert this session. Able to perform dynamic reaching activities EOB x with Min A to cross midline with LUE and to return to midline    OT Diagnosis:    OT Problem List:   OT  Treatment Interventions:     OT Goals ADL Goals Pt Will Perform Grooming: with min assist;Supported;Sitting, edge of bed;Sitting at sink ADL Goal: Grooming - Progress: Progressing toward goals Pt Will Perform Upper Body Bathing: with min assist;Sitting, chair;Sitting, edge of bed;Supported ADL Goal: Upper Body Bathing - Progress: Progressing toward goals Pt Will Perform Lower Body Bathing: with min assist;Sitting, chair;Sitting, edge of bed;Supported Pt Will Transfer to Toilet: with mod assist;Ambulation;with DME;Comfort height toilet ADL Goal: Toilet Transfer - Progress: Progressing toward goals Miscellaneous OT Goals Miscellaneous OT Goal #1: Pt will perform bed mobility with mod assist in prep for EOB ADLs. OT Goal: Miscellaneous Goal #1 - Progress: Progressing toward goals Miscellaneous OT Goal #2: Pt will tolerate >15 of therapy in order to increase activity tolerance as precursor for ADLs. OT Goal: Miscellaneous Goal #2 - Progress: Progressing toward goals Miscellaneous OT Goal #3: Pt will set EOB with min assist >5 min as precursor for ADLs. OT Goal: Miscellaneous Goal #3 - Progress: Progressing toward goals  Visit Information  Last OT Received On: 12/26/11 Assistance Needed: +2 (Simultaneous filing. User may not have seen previous data.)    Subjective Data      Prior Functioning       Cognition  Overall Cognitive Status: Impaired Area of Impairment: Attention;Safety/judgement;Problem solving Arousal/Alertness: Awake/alert Current Attention Level: Sustained (~10 seconds) Safety/Judgement: Decreased awareness of safety precautions;Decreased safety judgement for tasks assessed Safety/Judgement - Other Comments: decreased awareness of left sided weakness Problem Solving: Pt slow to process and needs extra time to complete task. Cognition - Other Comments: Pt easily distracted and needs cues for redirection to task.      Mobility  Shoulder Instructions  Bed Mobility Bed  Mobility: Supine to Sit Supine to Sit: 4: Min assist Sitting - Scoot to Edge of Bed: 2: Max assist Details for Bed Mobility Assistance: (A) to elevate trunk OOB.  Pt able to activate LE OOB with manual cues.   (A) to scoot hips EOB with cues for technique Transfers Sit to Stand: 1: +2 Total assist;From bed;From chair/3-in-1 Sit to Stand: Patient Percentage: 40% Stand to Sit: 1: +2 Total assist;To bed;To chair/3-in-1 Stand to Sit: Patient Percentage: 40% Details for Transfer Assistance: +2 (A) to initiate transfer with cues for hand placement.  Pt continues to keep Left LE in extension with noticeable tone.  Performed sit<-> stand x 3 during peri care each time pt needed manual cues to slide left LE back during transfer due to tone.  Pt needed +2 (A) to maintain upright posture with manual cues to advance LE especially left LE during transfer.        Exercises   Performed bil shoulder flexion x 5 with assisted needed for LUE; bil elbow flexion x 5   Balance Balance Balance Assessed: Yes Static Standing Balance Static Standing - Balance Support: Bilateral upper extremity supported;During functional activity Static Standing - Level of Assistance: 1: +2 Total assist Static Standing - Comment/# of Minutes: ~ 5 minutes x 2 with total (A) +2 to maintain upright posture and midline to prevent right lean.     End of Session OT - End of Session Equipment Utilized During Treatment: Gait belt Activity Tolerance: Patient tolerated treatment well  GO     Brendan Scott 12/26/2011, 2:56 PM

## 2011-12-27 ENCOUNTER — Inpatient Hospital Stay (HOSPITAL_COMMUNITY)
Admission: RE | Admit: 2011-12-27 | Discharge: 2012-01-23 | DRG: 462 | Disposition: A | Discharge status: Home Health | Payer: BC Managed Care – PPO | Source: Ambulatory Visit | Attending: Physical Medicine & Rehabilitation | Admitting: Physical Medicine & Rehabilitation

## 2011-12-27 DIAGNOSIS — I6350 Cerebral infarction due to unspecified occlusion or stenosis of unspecified cerebral artery: Secondary | ICD-10-CM | POA: Diagnosis not present

## 2011-12-27 DIAGNOSIS — Z79899 Other long term (current) drug therapy: Secondary | ICD-10-CM

## 2011-12-27 DIAGNOSIS — Z833 Family history of diabetes mellitus: Secondary | ICD-10-CM

## 2011-12-27 DIAGNOSIS — R471 Dysarthria and anarthria: Secondary | ICD-10-CM | POA: Diagnosis not present

## 2011-12-27 DIAGNOSIS — G8190 Hemiplegia, unspecified affecting unspecified side: Secondary | ICD-10-CM | POA: Diagnosis not present

## 2011-12-27 DIAGNOSIS — M20099 Other deformity of finger(s), unspecified finger(s): Secondary | ICD-10-CM | POA: Diagnosis not present

## 2011-12-27 DIAGNOSIS — K5900 Constipation, unspecified: Secondary | ICD-10-CM

## 2011-12-27 DIAGNOSIS — Z87891 Personal history of nicotine dependence: Secondary | ICD-10-CM | POA: Diagnosis not present

## 2011-12-27 DIAGNOSIS — I6330 Cerebral infarction due to thrombosis of unspecified cerebral artery: Secondary | ICD-10-CM | POA: Diagnosis not present

## 2011-12-27 DIAGNOSIS — E119 Type 2 diabetes mellitus without complications: Secondary | ICD-10-CM | POA: Diagnosis not present

## 2011-12-27 DIAGNOSIS — Z7901 Long term (current) use of anticoagulants: Secondary | ICD-10-CM

## 2011-12-27 DIAGNOSIS — Z96659 Presence of unspecified artificial knee joint: Secondary | ICD-10-CM

## 2011-12-27 DIAGNOSIS — I10 Essential (primary) hypertension: Secondary | ICD-10-CM | POA: Diagnosis not present

## 2011-12-27 DIAGNOSIS — R1310 Dysphagia, unspecified: Secondary | ICD-10-CM

## 2011-12-27 DIAGNOSIS — I4891 Unspecified atrial fibrillation: Secondary | ICD-10-CM

## 2011-12-27 DIAGNOSIS — I639 Cerebral infarction, unspecified: Secondary | ICD-10-CM

## 2011-12-27 DIAGNOSIS — I6340 Cerebral infarction due to embolism of unspecified cerebral artery: Secondary | ICD-10-CM

## 2011-12-27 DIAGNOSIS — Z5189 Encounter for other specified aftercare: Secondary | ICD-10-CM | POA: Diagnosis not present

## 2011-12-27 DIAGNOSIS — N390 Urinary tract infection, site not specified: Secondary | ICD-10-CM | POA: Diagnosis not present

## 2011-12-27 DIAGNOSIS — T17908A Unspecified foreign body in respiratory tract, part unspecified causing other injury, initial encounter: Secondary | ICD-10-CM

## 2011-12-27 DIAGNOSIS — G40802 Other epilepsy, not intractable, without status epilepticus: Secondary | ICD-10-CM

## 2011-12-27 DIAGNOSIS — B964 Proteus (mirabilis) (morganii) as the cause of diseases classified elsewhere: Secondary | ICD-10-CM | POA: Diagnosis not present

## 2011-12-27 DIAGNOSIS — Z8261 Family history of arthritis: Secondary | ICD-10-CM | POA: Diagnosis not present

## 2011-12-27 DIAGNOSIS — G8110 Spastic hemiplegia affecting unspecified side: Secondary | ICD-10-CM | POA: Diagnosis not present

## 2011-12-27 LAB — PROTIME-INR
INR: 1.77 — ABNORMAL HIGH (ref 0.00–1.49)
Prothrombin Time: 20 seconds — ABNORMAL HIGH (ref 11.6–15.2)

## 2011-12-27 MED ORDER — WARFARIN SODIUM 7.5 MG PO TABS
7.5000 mg | ORAL_TABLET | Freq: Once | ORAL | Status: DC
  Administered 2011-12-27: 7.5 mg via ORAL
  Filled 2011-12-27: qty 1

## 2011-12-27 MED ORDER — METFORMIN HCL 500 MG PO TABS
500.0000 mg | ORAL_TABLET | Freq: Every day | ORAL | Status: DC
  Administered 2011-12-27: 500 mg via ORAL
  Filled 2011-12-27 (×2): qty 1

## 2011-12-27 MED ORDER — LACOSAMIDE 50 MG PO TABS: 300.0000 mg | ORAL_TABLET | Freq: Two times a day (BID) | ORAL | Status: DC

## 2011-12-27 MED ORDER — SIMVASTATIN 20 MG PO TABS
20.0000 mg | ORAL_TABLET | Freq: Every day | ORAL | Status: DC
  Administered 2011-12-28 – 2012-01-22 (×26): 20 mg via ORAL
  Filled 2011-12-27 (×27): qty 1

## 2011-12-27 MED ORDER — ENOXAPARIN SODIUM 40 MG/0.4ML ~~LOC~~ SOLN
40.0000 mg | SUBCUTANEOUS | Status: DC
  Administered 2011-12-28: 40 mg via SUBCUTANEOUS
  Filled 2011-12-27: qty 0.4

## 2011-12-27 MED ORDER — LISINOPRIL 5 MG PO TABS
5.0000 mg | ORAL_TABLET | Freq: Every day | ORAL | Status: DC
  Administered 2011-12-28 – 2012-01-23 (×27): 5 mg via ORAL
  Filled 2011-12-27 (×31): qty 1

## 2011-12-27 MED ORDER — LACOSAMIDE 200 MG PO TABS
300.0000 mg | ORAL_TABLET | Freq: Two times a day (BID) | ORAL | Status: DC
  Administered 2011-12-28 – 2012-01-03 (×14): 300 mg via ORAL
  Filled 2011-12-27: qty 2
  Filled 2011-12-27 (×2): qty 1
  Filled 2011-12-27: qty 6
  Filled 2011-12-27 (×6): qty 2
  Filled 2011-12-27: qty 1
  Filled 2011-12-27: qty 2
  Filled 2011-12-27: qty 6
  Filled 2011-12-27: qty 2
  Filled 2011-12-27: qty 1
  Filled 2011-12-27: qty 2
  Filled 2011-12-27: qty 1

## 2011-12-27 MED ORDER — ACETAMINOPHEN 325 MG PO TABS: 325.0000 mg | ORAL_TABLET | ORAL | Status: DC | PRN

## 2011-12-27 MED ORDER — WARFARIN VIDEO: Freq: Once | Status: DC

## 2011-12-27 MED ORDER — INSULIN ASPART 100 UNIT/ML ~~LOC~~ SOLN
0.0000 [IU] | Freq: Three times a day (TID) | SUBCUTANEOUS | Status: DC
  Administered 2011-12-28: 3 [IU] via SUBCUTANEOUS
  Administered 2011-12-28: 2 [IU] via SUBCUTANEOUS
  Administered 2011-12-29: 1 [IU] via SUBCUTANEOUS
  Administered 2011-12-29 – 2011-12-30 (×2): 2 [IU] via SUBCUTANEOUS
  Administered 2011-12-30: 1 [IU] via SUBCUTANEOUS
  Administered 2011-12-31 (×3): 2 [IU] via SUBCUTANEOUS
  Administered 2012-01-01: 1 [IU] via SUBCUTANEOUS
  Administered 2012-01-01: 2 [IU] via SUBCUTANEOUS
  Administered 2012-01-02 (×2): 1 [IU] via SUBCUTANEOUS
  Administered 2012-01-02: 2 [IU] via SUBCUTANEOUS
  Administered 2012-01-03 – 2012-01-04 (×5): 1 [IU] via SUBCUTANEOUS
  Administered 2012-01-06: 2 [IU] via SUBCUTANEOUS
  Administered 2012-01-07: 1 [IU] via SUBCUTANEOUS
  Administered 2012-01-07 – 2012-01-08 (×2): 2 [IU] via SUBCUTANEOUS
  Administered 2012-01-08: 1 [IU] via SUBCUTANEOUS
  Administered 2012-01-08: 3 [IU] via SUBCUTANEOUS
  Administered 2012-01-09 – 2012-01-10 (×3): 1 [IU] via SUBCUTANEOUS
  Administered 2012-01-10 – 2012-01-11 (×2): 2 [IU] via SUBCUTANEOUS
  Administered 2012-01-11 – 2012-01-12 (×3): 1 [IU] via SUBCUTANEOUS
  Administered 2012-01-12 – 2012-01-14 (×3): 2 [IU] via SUBCUTANEOUS
  Administered 2012-01-14 – 2012-01-15 (×2): 1 [IU] via SUBCUTANEOUS
  Administered 2012-01-15: 2 [IU] via SUBCUTANEOUS
  Administered 2012-01-16: 1 [IU] via SUBCUTANEOUS
  Administered 2012-01-16 – 2012-01-17 (×2): 2 [IU] via SUBCUTANEOUS
  Administered 2012-01-17: 1 [IU] via SUBCUTANEOUS
  Administered 2012-01-18 (×2): 2 [IU] via SUBCUTANEOUS
  Administered 2012-01-19 (×2): 1 [IU] via SUBCUTANEOUS
  Administered 2012-01-19: 2 [IU] via SUBCUTANEOUS
  Administered 2012-01-20: 1 [IU] via SUBCUTANEOUS
  Administered 2012-01-20: 3 [IU] via SUBCUTANEOUS
  Administered 2012-01-21 – 2012-01-22 (×3): 2 [IU] via SUBCUTANEOUS
  Administered 2012-01-22: 1 [IU] via SUBCUTANEOUS
  Administered 2012-01-23: 2 [IU] via SUBCUTANEOUS

## 2011-12-27 MED ORDER — RESOURCE THICKENUP CLEAR PO POWD
ORAL | Status: DC | PRN
  Filled 2011-12-27: qty 125

## 2011-12-27 MED ORDER — ONDANSETRON HCL 4 MG/2ML IJ SOLN: 4.0000 mg | Freq: Four times a day (QID) | INTRAMUSCULAR | Status: DC | PRN

## 2011-12-27 MED ORDER — CLONAZEPAM 0.5 MG PO TABS
0.5000 mg | ORAL_TABLET | Freq: Every day | ORAL | Status: DC
  Administered 2011-12-28 – 2012-01-22 (×26): 0.5 mg via ORAL
  Filled 2011-12-27 (×4): qty 1
  Filled 2011-12-27: qty 2
  Filled 2011-12-27 (×23): qty 1

## 2011-12-27 MED ORDER — LEVETIRACETAM 750 MG PO TABS
2500.0000 mg | ORAL_TABLET | Freq: Two times a day (BID) | ORAL | Status: DC
  Administered 2011-12-27 – 2012-01-23 (×53): 2500 mg via ORAL
  Filled 2011-12-27 (×58): qty 1

## 2011-12-27 MED ORDER — ENSURE PUDDING PO PUDG
1.0000 | Freq: Three times a day (TID) | ORAL | Status: DC
  Administered 2011-12-27 – 2012-01-08 (×41): 1 via ORAL

## 2011-12-27 MED ORDER — POLYETHYLENE GLYCOL 3350 17 G PO PACK
17.0000 g | PACK | Freq: Every day | ORAL | Status: DC | PRN
  Administered 2012-01-10: 17 g via ORAL
  Filled 2011-12-27 (×2): qty 1

## 2011-12-27 MED ORDER — PHENYTOIN 50 MG PO CHEW
100.0000 mg | CHEWABLE_TABLET | Freq: Three times a day (TID) | ORAL | Status: DC
  Administered 2011-12-28 – 2012-01-03 (×21): 100 mg via ORAL
  Filled 2011-12-27 (×25): qty 2

## 2011-12-27 MED ORDER — WARFARIN - PHARMACIST DOSING INPATIENT
Freq: Every day | Status: DC
  Administered 2012-01-14 – 2012-01-19 (×2)

## 2011-12-27 MED ORDER — WARFARIN SODIUM 7.5 MG PO TABS: 7.5000 mg | ORAL_TABLET | Freq: Once | ORAL | Status: DC

## 2011-12-27 MED ORDER — ONDANSETRON HCL 4 MG PO TABS: 4.0000 mg | ORAL_TABLET | Freq: Four times a day (QID) | ORAL | Status: DC | PRN

## 2011-12-27 MED ORDER — DEXTROSE 5 % IV SOLN
1.0000 g | INTRAVENOUS | Status: DC
  Administered 2011-12-28: 1 g via INTRAVENOUS
  Filled 2011-12-27 (×2): qty 10

## 2011-12-27 MED ORDER — SORBITOL 70 % SOLN: 30.0000 mL | Freq: Every day | Status: DC | PRN

## 2011-12-27 MED ORDER — SENNOSIDES-DOCUSATE SODIUM 8.6-50 MG PO TABS
1.0000 | ORAL_TABLET | Freq: Every day | ORAL | Status: DC
  Administered 2011-12-28 – 2012-01-22 (×26): 1 via ORAL
  Filled 2011-12-27: qty 1
  Filled 2011-12-27: qty 2
  Filled 2011-12-27 (×25): qty 1

## 2011-12-27 NOTE — Progress Notes (Signed)
Physical Therapy Treatment Note   12/27/11 0920  PT Visit Information  Last PT Received On 12/27/11  Assistance Needed +2 (2nd person for chair and lines)  PT Time Calculation  PT Start Time 0920  PT Stop Time 0944  PT Time Calculation (min) 24 min  Subjective Data  Subjective pt received supine in bed agreeable to PT  Precautions  Precautions Fall  Restrictions  Weight Bearing Restrictions No  Cognition  Overall Cognitive Status Appears within functional limits for tasks assessed/performed  Area of Impairment Problem solving  Problem Solving increased response time  Bed Mobility  Bed Mobility Supine to Sit  Supine to Sit 4: Min assist  Details for Bed Mobility Assistance pt with improved initiation, A to come to full upright sitting  Transfers  Transfers Sit to Stand;Stand to Sit  Sit to Stand 4: Min assist;From bed  Stand to Sit 4: Min assist;To chair/3-in-1  Details for Transfer Assistance v/c's for hand placement  Ambulation/Gait  Ambulation/Gait Assistance 3: Mod assist  Ambulation/Gait: Patient Percentage 70%  Ambulation Distance (Feet) 80 Feet  Assistive device 1 person hand held assist (1 person for chair follow and lines)  Ambulation/Gait Assistance Details pt unable to use   Gait Pattern Step-to pattern;Decreased stride length;Shuffle;Scissoring;Narrow base of support  Gait velocity slow  General Gait Details v/c's for sequencing. patient unable to use RW due to inability to grip with L hand despite bicep/tricep strength.   Modified Rankin (Stroke Patients Only)  Pre-Morbid Rankin Score 0  Modified Rankin 5  Exercises  Exercises Other exercises (L UE dynamic reaching x 10, L LAQ x 10)  PT - End of Session  Equipment Utilized During Treatment Gait belt  Activity Tolerance Patient tolerated treatment well  Patient left in chair;with call bell/phone within reach  Nurse Communication Mobility status  PT - Assessment/Plan  Comments on Treatment Session Patient  con't to demonstrate improvement with transfers and ambultion. Patient with improved L UE strength but remains to have 0/5 L grip strength. patient to con't to benefit from inpatient rehab to achieve maximal functional independence for safe transition home.  PT Plan Frequency remains appropriate;Discharge plan needs to be updated  PT Frequency Min 4X/week  Follow Up Recommendations CIR;Supervision/Assistance - 24 hour  Acute Rehab PT Goals  PT Goal: Supine/Side to Sit - Progress Progressing toward goal  PT Goal: Sit to Stand - Progress Progressing toward goal  PT Goal: Stand to Sit - Progress Progressing toward goal  PT Goal: Ambulate - Progress Progressing toward goal  PT General Charges  $$ ACUTE PT VISIT 1 Procedure  PT Treatments  $Gait Training 23-37 mins    Pain: pt denies  Lewis Shock, PT, DPT Pager #: [phone] Office #: [phone]

## 2011-12-27 NOTE — PMR Pre-admission (Signed)
PMR Admission Coordinator Pre-Admission Assessment  Patient: Brendan Scott is an 66 y.o., male MRN: [id number] DOB: [birth year] Height: 5\' 6"  (167.6 cm) Weight: 95.5 kg (210 lb 8.6 oz)              Insurance Information HMO:      PPO: yes       PCP:       IPA:       80/20:       OTHER:   PRIMARY: State BCBS      Policy#: [id number]      Subscriber: pt CM Name: Elnita Maxwell      Phone#: [phone]     Fax#: [phone] [update by 01/04/12-after conference] Pre-Cert#: [id number]      Employer: [employer] Benefits:  Phone #: [phone]     Name: Gwyneth Revels. Date: 07-31-11     Deduct: $350.00 met      Out of Pocket Max: $1605.00 [met $749.07]      Life Max: none CIR: $233.00 copay then 80/20%      SNF: 80/20% 100 day limit Outpatient: 100% after deduct & OP met      Co-Pay: none  Home Health: 80%      Co-Pay: 20% DME: 80%     Co-Pay: 20% Providers: precert # [id number] SECONDARY: Medicare      Policy#: [id number]      Subscriber: pt CM Name:      Phone#:       Fax#:   Pre-Cert#:        Employer:  Benefits:  Phone #:       Name: Armed forces technical officer. Date: 10/01/10     Deduct: $1184.00      Out of Pocket Max: none      Life Max: none CIR: 100%      SNF: 1-20 100% 21-100 $148.00/day Outpatient: 80%     Co-Pay: 20% Home Health: 100%      Co-Pay: 20% equipment used DME: 80%     Co-Pay: 20%        Emergency Contact Information Contact Information    Name Relation Home Work [place] [phone]     Current Medical History  Patient Admitting Diagnosis: right MCA infarct   History of Present Illness:  66 y.o. right-handed male with history of atrial fibrillation on aspirin therapy prior to admission as well as diabetes mellitus. Admitted 12/15/2011 after being found in his driveway by family unresponsive. EMS arrived and noted patient to be slightly confused with left facial droop and dysarthria. MRI of the brain showed a large right middle  cerebral artery distribution nonhemorrhagic infarct as well as incidental findings of large left sphenoid wing mass consistent with meningioma. MRA of the head showed high-grade stenosis proximal basilar artery. Echocardiogram with ejection fraction of 60% and no wall motion abnormalities without emboli. Cerebral angiogram showed no occlusions or stenosis. There was reported seizure 12/16/2011 and patient has been loaded with Keppra /Dilantin as well as vimpat. EEG did not show definite epileptiform discharges. Neurology services consulted presently maintained on aspirin therapy as well as subcutaneous Lovenox for DVT prophylaxis. Patient is not a TPA candidate. Speech therapy followup for dysphagia presently advised n.p.o. Physical and occupational therapy evaluations completed and ongoing.    Total: 9     Past Medical History  Past Medical History  Diagnosis Date  . Arthritis   . Diabetes mellitus   . Atrial fibrillation     Family  History  family history includes Arthritis in an unspecified family member and Diabetes in an unspecified family member.  Prior Rehab/Hospitalizations: no   Current Medications  Current facility-administered medications:0.9 %  sodium chloride infusion, , Intravenous, Continuous, York Spaniel, MD, Last Rate: 75 mL/hr at 12/27/11 1015, 75 mL/hr at 12/27/11 1015;  antiseptic oral rinse (BIOTENE) solution 15 mL, 15 mL, Mouth Rinse, BID, Micki Riley, MD, 15 mL at 12/27/11 0800;  cefTRIAXone (ROCEPHIN) 1 g in dextrose 5 % 50 mL IVPB, 1 g, Intravenous, Q24H, Layne Benton, NP, 1 g at 12/26/11 1533 clonazePAM (KLONOPIN) tablet 0.5 mg, 0.5 mg, Oral, QHS, Layne Benton, NP;  enoxaparin (LOVENOX) injection 40 mg, 40 mg, Subcutaneous, Q24H, York Spaniel, MD, 40 mg at 12/27/11 1016;  feeding supplement (ENSURE) pudding 1 Container, 1 Container, Oral, TID PC & HS, Hettie Holstein, RD, 1 Container at 12/27/11 0900;  insulin aspart (novoLOG) injection 0-9 Units,  0-9 Units, Subcutaneous, TID WC, Layne Benton, NP, 5 Units at 12/27/11 1258 labetalol (NORMODYNE,TRANDATE) injection 10 mg, 10 mg, Intravenous, Q10 min PRN, Cathlyn Parsons, PA-C;  lacosamide (VIMPAT) tablet 300 mg, 300 mg, Oral, BID, Layne Benton, NP;  levETIRAcetam (KEPPRA) tablet 2,500 mg, 2,500 mg, Oral, Q12H, Layne Benton, NP, 2,500 mg at 12/27/11 1015;  lisinopril (PRINIVIL,ZESTRIL) tablet 5 mg, 5 mg, Oral, Daily, Layne Benton, NP, 5 mg at 12/27/11 1016 metFORMIN (GLUCOPHAGE) tablet 500 mg, 500 mg, Oral, Q breakfast, Layne Benton, NP;  midazolam (VERSED) injection, , Intravenous, PRN, Oneal Grout, MD, 1 mg at 12/15/11 1645;  phenytoin (DILANTIN) tablet 100 mg, 100 mg, Oral, TID, Layne Benton, NP, 100 mg at 12/27/11 1015;  RESOURCE THICKENUP CLEAR, , Oral, PRN, Layne Benton, NP;  senna-docusate (Senokot-S) tablet 1 tablet, 1 tablet, Oral, QHS, Noel Christmas, 1 tablet at 12/26/11 2253 simvastatin (ZOCOR) tablet 20 mg, 20 mg, Oral, q1800, Layne Benton, NP, 20 mg at 12/26/11 1733;  [COMPLETED] warfarin (COUMADIN) tablet 7.5 mg, 7.5 mg, Oral, ONCE-1800, Brett Fairy, PHARMD, 7.5 mg at 12/26/11 1733;  warfarin (COUMADIN) tablet 7.5 mg, 7.5 mg, Oral, ONCE-1800, Micki Riley, MD;  warfarin (COUMADIN) video, , Does not apply, Once, Brett Fairy, PHARMD Warfarin - Pharmacist Dosing Inpatient, , Does not apply, q1800, Brett Fairy, PHARMD;  [DISCONTINUED] lacosamide (VIMPAT) 300 mg in sodium chloride 0.9 % 25 mL IVPB, 300 mg, Intravenous, Q12H, Cathlyn Parsons, PA-C, 300 mg at 12/27/11 1100  Patients Current Diet: Dysphagia  Precautions / Restrictions Precautions Precautions: Fall Precaution Comments: D1  Pudding thick liquids Weight Bearing Restrictions: No   Prior Activity Level  Worked full time Journalist, newspaper / Equipment Home Assistive Devices/Equipment: None Home Adaptive Equipment: Walker - rolling;Shower chair without back  Prior Functional Level Prior Function Level of  Independence: Independent Able to Take Stairs?: Yes Driving: Yes Vocation: Full time employment Comments: custodian  Current Functional Level Cognition  Arousal/Alertness: Awake/alert Overall Cognitive Status: Impaired Overall Cognitive Status: Appears within functional limits for tasks assessed/performed Difficult to assess due to: Level of arousal Current Attention Level: Sustained (~10 seconds) Orientation Level: Oriented X4 Safety/Judgement: Decreased awareness of safety precautions;Decreased safety judgement for tasks assessed Safety/Judgement - Other Comments: decreased awareness of left sided weakness Cognition - Other Comments: Pt easily distracted and needs cues for redirection to task.   Attention: Focused;Sustained Focused Attention: Appears intact Sustained Attention: Impaired Sustained Attention Impairment: Verbal basic;Functional basic Memory:  (NT) Awareness: Impaired Awareness Impairment: Emergent impairment  Problem Solving: Impaired Problem Solving Impairment: Verbal basic;Functional basic Executive Function: Reasoning;Self Monitoring Reasoning: Impaired Reasoning Impairment: Functional basic;Verbal basic Self Monitoring: Impaired Self Monitoring Impairment: Functional basic Safety/Judgment: Impaired    Extremity Assessment (includes Sensation/Coordination)  RUE ROM/Strength/Tone: Unable to fully assess;Due to impaired cognition (decreased alertness)  RLE ROM/Strength/Tone: Within functional levels (MMT 5/5) RLE Sensation: WFL - Light Touch    ADLs  Grooming: Wash/dry hands;Minimal assistance Where Assessed - Grooming: Supported sitting Upper Body Bathing: Maximal assistance Where Assessed - Upper Body Bathing: Supine, head of bed up Lower Body Bathing: +1 Total assistance Where Assessed - Lower Body Bathing: Rolling right and/or left (Max assist required to roll ) Upper Body Dressing: Maximal assistance Where Assessed - Upper Body Dressing: Supported  sitting Lower Body Dressing: +1 Total assistance (don socks) Where Assessed - Lower Body Dressing: Supported sitting Toilet Transfer: +2 Total assistance Toilet Transfer: Patient Percentage: 60% Statistician Method: Surveyor, minerals: Materials engineer and Hygiene: +2 Total assistance (+3 needed to perform hygiene, +2 for sit to stand) Toileting - Architect and Hygiene: Patient Percentage:  (pt only able to assist with standing) Where Assessed - Toileting Clothing Manipulation and Hygiene: Sit to stand from 3-in-1 or toilet Equipment Used: Gait belt Transfers/Ambulation Related to ADLs: +2total A-pt=40% sit to stand and 60% stand pivot transfer from bed to bedside to chair- pt more participatory today. Able to advance feet sideways with assist to move RLE.  ADL Comments: More alert this session. Able to perform dynamic reaching activities EOB x with Min A to cross midline with LUE and to return to midline    Mobility  Bed Mobility: Supine to Sit Rolling Right: 1: +2 Total assist Rolling Right: Patient Percentage: 40% Rolling Left: 3: Mod assist Left Sidelying to Sit: 1: +2 Total assist Left Sidelying to Sit: Patient Percentage: 50% Supine to Sit: 4: Min assist Supine to Sit: Patient Percentage: 30% Sitting - Scoot to Edge of Bed: 2: Max assist Sit to Supine: 1: +2 Total assist;HOB flat Sit to Supine: Patient Percentage: 30% Scooting to HOB: 1: +2 Total assist Scooting to [facility]: Patient Percentage: 0%    Transfers  Transfers: Sit to Stand;Stand to Sit Sit to Stand: 4: Min assist;From bed Sit to Stand: Patient Percentage: 40% Stand to Sit: 4: Min assist;To chair/3-in-1 Stand to Sit: Patient Percentage: 40% Stand Pivot Transfers: 1: +2 Total assist Stand Pivot Transfers: Patient Percentage: 60%    Ambulation / Gait / Stairs / Psychologist, prison and probation services  Ambulation/Gait Ambulation/Gait Assistance: 3: Mod  assist Ambulation/Gait: Patient Percentage: 70% Ambulation Distance (Feet): 80 Feet Assistive device: 1 person hand held assist (1 person for chair follow and lines) Ambulation/Gait Assistance Details: pt unable to use  Gait Pattern: Step-to pattern;Decreased stride length;Shuffle;Scissoring;Narrow base of support Gait velocity: slow General Gait Details: v/c's for sequencing. patient unable to use RW due to inability to grip with L hand despite bicep/tricep strength.  Stairs: No Corporate treasurer: No    Posture / Games developer Sitting - Balance Support: Feet supported;Bilateral upper extremity supported Static Sitting - Level of Assistance: 4: Min assist;5: Stand by assistance Static Sitting - Comment/# of Minutes: initial min (A) to maintain balance and able to progress to minguard. Static Standing Balance Static Standing - Balance Support: Bilateral upper extremity supported;During functional activity Static Standing - Level of Assistance: 1: +2 Total assist Static Standing - Comment/# of Minutes: ~ 5 minutes x 2 with  total (A) +2 to maintain upright posture and midline to prevent right lean.      Special needs/care consideration BiPAP/CPAP- no CPM -no Continuous Drip IV-  No, but, currently on IV Rocephin. Dialysis-no        Days  Life Vest-no Oxygen-no Special Bed-no Trach Size-no Wound Vac (area)-no      Location  Skin-dry                          Location  Bowel mgmt: LBM noted 12/26/11 Bladder mgmt:  [facility] cath Diabetic mgmt   SSI & po meds     Previous Home Environment Living Arrangements: Spouse/significant other;Children Lives With: Spouse Available Help at Discharge: Family;Available 24 hours/day Type of Home: Mobile home Home Layout: One level Home Access: Stairs to enter Entrance Stairs-Rails: Right;Left;Can reach both Entrance Stairs-Number of Steps: 4 Bathroom Shower/Tub: Walk-in shower;Door [employer] Toilet:  Standard Bathroom Accessibility: Yes How Accessible: Accessible via walker Home Care Services: No  Discharge Living Setting Plans for Discharge Living Setting: Patient's home Do you have any problems obtaining your medications?: No  Social/Family/Support Systems Patient Roles: Spouse Anticipated Caregiver: wife & other family Anticipated Caregiver's Contact Information:  cell [phone]  Ability/Limitations of Caregiver: S-Min A Caregiver Availability: 24/7 Discharge Plan Discussed with Primary Caregiver: Yes Is Caregiver In Agreement with Plan?: Yes Does Caregiver/Family have Issues with Lodging/Transportation while Pt is in Rehab?: No    Goals/Additional Needs Patient/Family Goal for Rehab: Mod I-S Expected length of stay: 2-3 weeks Pt/Family Agrees to Admission and willing to participate: Yes Program Orientation Provided & Reviewed with Pt/Caregiver Including Roles  & Responsibilities: Yes   Decrease burden of Care through IP rehab admission:  n/a   Possible need for SNF placement upon discharge:  Wife wants d/c home   Patient Condition:    Pt evaluated initially by Dr. Riley Kill on 12/18/11.  Pt has now progressed in therapies & medically & has been evaluated today 12/27/11 as ready for CIR. Preadmission Screen Completed By:  Brock Ra, 12/27/2011 3:53 PM ______________________________________________________________________   Discussed status with Dr. Riley Kill on 12/27/11 at 3:52pm and received telephone approval for admission today.  Admission Coordinator:  Brock Ra, time 3:52pm/Date 12/27/11

## 2011-12-27 NOTE — Progress Notes (Signed)
ANTICOAGULATION CONSULT NOTE - Follow Up Consult  Pharmacy Consult for Coumadin Indication: atrial fibrillation  No Known Allergies  Patient Measurements: Height: 5\' 6"  (167.6 cm) Weight: 210 lb 8.6 oz (95.5 kg) IBW/kg (Calculated) : 63.8   Vital Signs: Temp: 97.6 F (36.4 C) (11/27 1015) Temp src: Oral (11/27 1015) BP: 133/95 mmHg (11/27 1016) Pulse Rate: 78  (11/27 1015)  Labs:  Basename 12/27/11 0600 12/26/11 0453  HGB -- --  HCT -- --  PLT -- --  APTT -- --  LABPROT 20.0* 14.6  INR 1.77* 1.16  HEPARINUNFRC -- --  CREATININE -- --  CKTOTAL -- --  CKMB -- --  TROPONINI -- --    Estimated Creatinine Clearance: 82.8 ml/min (by C-G formula based on Cr of 0.95).  Assessment: Brendan Scott is a 66 y/o male patient with a history of afib admitted with stroke, presenting outside of window for TPA. Patient has a history of seizures and has had episodes in hospital as well. Recent corrected dilantin level at 14.7 (11/24).  Coumadin per pharmacy started 11.25 atient was able to take a 7.5mg  tablet x 2 doses. INR subtherapeutic large increase, noted drug interaction with dilantin. Will increase INR initially then decrease INR (inducer). No bleeding noted.  Goal of Therapy:  INR 2-3 Monitor platelets by anticoagulation protocol: Yes   Plan:  Repeat coumadin 7.5mg  today and f/u daily protime.  Verlene Mayer, PharmD, [place] Pager [phone] 12/27/2011 12:37 PM

## 2011-12-27 NOTE — Progress Notes (Signed)
Note pt much improved.  Will fax info to pt's insurance requesting auth for CIR.  Will f/up.  [phone]

## 2011-12-27 NOTE — Discharge Summary (Signed)
Stroke Discharge Summary  Patient ID: Brendan Scott   MRN: [id number]      DOB: 08-19-1945  Date of Admission: 12/15/2011 Date of Discharge: 12/27/2011  Attending Physician:  Darcella Cheshire, MD, Stroke MD  Consulting Physician(s):  Max Fickle, MD pulmonary/intensive care, Faith Rogue, MD (Physical Medicine & Rehabtilitation)  Patient's PCP:  Sheila Oats, MD  Discharge Diagnoses:  Principal Problem:  *Embolic stroke involving middle cerebral artery - large right middle cerebral artery distribution non hemorrhagic infarct, embolic secondary to atrial fibrillation. 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     Dysphagia secondary to stroke/seizure  Hypertension   Cytotoxic edema secondary to stroke   Urinary Tract Infection  Hiccups, resolved    Past Medical History  Diagnosis Date  . Arthritis   . Diabetes mellitus   . Atrial fibrillation    Past Surgical History  Procedure Date  . Total knee arthroplasty     Bilateral    Medications to be continued on Rehab    . antiseptic oral rinse  15 mL Mouth Rinse BID  . cefTRIAXone (ROCEPHIN)  IV  1 g Intravenous Q24H  . clonazePAM  0.5 mg Oral QHS  . enoxaparin (LOVENOX) injection  40 mg Subcutaneous Q24H  . feeding supplement  1 Container Oral TID PC & HS  . insulin aspart  0-9 Units Subcutaneous TID WC  . lacosamide  300 mg Oral BID  . levETIRAcetam  2,500 mg Oral Q12H  . lisinopril  5 mg Oral Daily  . phenytoin  100 mg Oral TID  . senna-docusate  1 tablet Oral QHS  . simvastatin  20 mg Oral q1800  . [COMPLETED] warfarin  7.5 mg Oral ONCE-1800  . warfarin  7.5 mg Oral ONCE-1800  . warfarin   Does not apply Once  . Warfarin - Pharmacist Dosing Inpatient   Does not  apply q1800  . [DISCONTINUED] clonazePAM  0.5 mg Oral Q12H  . [DISCONTINUED] insulin aspart  0-9 Units Subcutaneous Q4H  . [DISCONTINUED] lacosamide (VIMPAT) IV  300 mg Intravenous Q12H  . [DISCONTINUED] levetiracetam  2,500 mg Intravenous Q12H  . [DISCONTINUED] LORazepam  2 mg Intravenous Once  . [DISCONTINUED] phenytoin (DILANTIN) IV  100 mg Intravenous Q8H    LABORATORY STUDIES CBC    Component Value Date/Time   WBC 13.6* 12/24/2011 1126   RBC 4.98 12/24/2011 1126   HGB 13.3 12/24/2011 1126   HCT 40.0 12/24/2011 1126   PLT 252 12/24/2011 1126   MCV 80.3 12/24/2011 1126   MCH 26.7 12/24/2011 1126   MCHC 33.3 12/24/2011 1126   RDW 15.0 12/24/2011 1126   LYMPHSABS 1.1 12/24/2011 1126   MONOABS 1.6* 12/24/2011 1126   EOSABS 0.3 12/24/2011 1126   BASOSABS 0.0 12/24/2011 1126   CMP    Component Value Date/Time   NA 132* 12/24/2011 1126   K 3.9 12/24/2011 1126   CL 97 12/24/2011 1126   CO2 27 12/24/2011 1126   GLUCOSE 243* 12/24/2011 1126   BUN 14 12/24/2011 1126   CREATININE 0.95 12/24/2011 1126   CALCIUM 9.5 12/24/2011 1126   PROT 6.7 12/17/2011 0448   ALBUMIN 2.5* 12/24/2011 2018   AST 20 12/17/2011 0448   ALT 22 12/17/2011 0448   ALKPHOS 65 12/17/2011 0448  BILITOT 0.8 12/17/2011 0448   GFRNONAA 85* 12/24/2011 1126   GFRAA >90 12/24/2011 1126   COAGS Lab Results  Component Value Date   INR 1.77* 12/27/2011   INR 1.16 12/26/2011   INR 0.98 12/15/2011   Lipid Panel    Component Value Date/Time   CHOL 184 12/16/2011 0430   TRIG 51 12/16/2011 0430   HDL 60 12/16/2011 0430   CHOLHDL 3.1 12/16/2011 0430   VLDL 10 12/16/2011 0430   LDLCALC 114* 12/16/2011 0430   HgbA1C  Lab Results  Component Value Date   HGBA1C 6.9* 12/16/2011   Cardiac Panel (last 3 results) No results found for this basename: CKTOTAL:3,CKMB:3,TROPONINI:3,RELINDX:3 in the last 72 hours Urinalysis    Component Value Date/Time   COLORURINE YELLOW 12/24/2011 1200   APPEARANCEUR  CLOUDY* 12/24/2011 1200   LABSPEC 1.016 12/24/2011 1200   PHURINE 8.5* 12/24/2011 1200   GLUCOSEU 100* 12/24/2011 1200   HGBUR NEGATIVE 12/24/2011 1200   BILIRUBINUR NEGATIVE 12/24/2011 1200   KETONESUR NEGATIVE 12/24/2011 1200   PROTEINUR NEGATIVE 12/24/2011 1200   UROBILINOGEN 1.0 12/24/2011 1200   NITRITE NEGATIVE 12/24/2011 1200   LEUKOCYTESUR NEGATIVE 12/24/2011 1200   Urine Drug Screen  No results found for this basename: labopia, cocainscrnur, labbenz, amphetmu, thcu, labbarb    Alcohol Level No results found for this basename: eth    SIGNIFICANT DIAGNOSTIC STUDIES CT of the brain  12/20/2011 Evolving cytotoxic edema in the right frontotemporal region. Early hemorrhagic transformation right posterior frontal cortex. No significant mass effect. 3 mm right-to-left shift.  12/16/2011 Exam is markedly motion degraded. Findings raise possibility of progressive right hemispheric infarct. Limited for detection of intracranial hemorrhage. Left anterior left temporal lobe/ clinoid/cavernous sinus region hyperdense lesions suggestive of a meningioma. This can be evaluated with contrast enhanced MR.  12/15/2011 Right middle cerebral artery slightly dense. There is minimal hypodensity right opercular region. This may reflect changes of early acute infarct. Large hyperdense mass centered at the left clinoid with extension into the left middle cranial fossa, left cavernous sinus, left subfrontal region and orbital apex region having appearance suggestive of a meningioma with bony remodeling. This can be assessed with contrast enhanced MR. Hypodensity adjacent left frontal lobe may reflect vasogenic edema. No intracranial hemorrhage. Prior right mastoidectomy. Mild thickening along the peripheral aspect of the mastoidectomy site. Partial opacification of residual right mastoid air cells.  Cerebral Angiogram 12/14/2101 S/P 4 vessel cerebral arteriogram. RT CFA approach. 1. No occlusions ,stenosis  dissections or intraluminal filling defects seen . 2. Venous outflow WNLs.  MRI of the brain 12/16/2011 Large right middle cerebral artery distribution non hemorrhagic infarct. Large left sphenoid wing mass consistent with meningioma with invasion  MRA of the brain 12/16/2011 Circumferential narrowing (caused by a meningioma) of the supraclinoid aspect of the left internal carotid artery, the carotid terminus, the A1 segment of the left anterior cerebral artery and slightly less so the M1 segment of the left middle cerebral artery (which is elevated by the mass). Decreased caliber/number of visualized left middle cerebral artery branch vessels may indicate flow limiting stenosis by the meningioma. Right internal carotid artery/carotid terminus without significant narrowing. No significant narrowing of the M1 segment or A1 segment of the right middle cerebral artery/anterior cerebral artery respectively. Right vertebral artery is dominant. Moderate to marked tandem stenosis left vertebral artery. High-grade stenosis proximal basilar artery.  2D Echocardiogram EF 55-60% with no source of embolus. Patient in atrial fibrillation  Carotid Doppler See cerebral angio  EEG  11/17/2013This  is a normal EEG recording during wakefulness. No evidence of an epileptic disorder was demonstrated. Rhythmic muscle activity consistent with tremor was seen frequently throughout the  record, as described above  12/21/2011-This is an EEG that is characterized by slow activity. This likely represents drowse. No focal slowing is noted. No epileptiform activity is noted.  CXR  12/24/2011 Cardiomegaly without CHF or pneumonia  12/15/2011 Mild cardiac enlargement. No active infiltrates or failure.  EKG ATRIAL FIBRILLATION, V-RATE 68- 92 ~ var'd rate, irreg atrial activity  LATERAL INFARCT, OLD ~ Q>47mS, abnormal ST-T, I aVL V5-6  ABNRM R PROG, CONSIDER ASMI OR LEAD PLACEMENT ~ Q >36mS, diminished R, V2  History of Present  Illness   Brendan Scott is an 66 y.o. male who was last seen normal at 14:30 on 12/14/2101 when he left for work. Patient never showed up to work. Family then found him in driveway unresponsive and called EMS. EMS arrived to find patient standing in driveway slightly confused, left facial droop and dysarthria. Code stroke was called and patient was brought to [facility] hospital. Patient remained with left facial droop, right gaze preference, unable to cross midline, dysarthria and left hemianopsia. CT scan of the head without contrast showed evidence of a fairly large mass lesion involving the left frontotemporal region as well as possible increased density of the right middle cerebral artery proximally, and possibly early MCA territory infarction. Mass lesion, vascular in nature,  on the left was thought to be incidental and likely a meningioma. As such, he was not considered a candidate for intravenous thrombolytic therapy with TPA. Patient was subsequently taken to Interventional Radiology for cerebral arteriogram was performed which showed no signs of occlusion of the right middle cerebral artery nor right internal carotid artery. There was mass effect involving the left middle cerebral artery with reduced diameter and straightening. Chronic permanent tumor blush was also seen. NIH stroke score was 8, including visual field defect and neglect of his left side as well as facial weakness and extremity weakness, and slurred speech. Modified Rankin score was 3. Patient was taking aspirin 81 mg per day prior to admission. He was admitted to the neuro ICU for further evaluation and treatment.  Hospital Course  MRI confirmed a large right middle cerebral artery distribution non hemorrhagic infarct. Infarct likely embolic infarct given atrial fibrillation. Cytotoxic edema was present. On aspirin 81 mg orally every day prior to admission. initially placed on aspirin and plavix. Once able to swallow, waslaced on warfarin for  secondary stroke prevention. Heart rate was controlled.  The newly identified left frontal and anterior temporal meningioma is an incidental finding.     Patient developed new onset acute partial eizures with epilepsia partialis continua, refractory to treatment in hospital. Ongoing seizures along with post-ictal state and  being on 3 separate anticonvulsants in good doses led to lethargy and slow initial recovery. Episodes were epileptic in nature, though EEG without epileptiform discharges, on dilantin, vimpat, keppra, and klonopin.  Patient with vascular risk factors of Diabetes, controlled with HgbA1c 6.9, hyperlipidemia, with LDL 114, on fish oil PTA but not on statin, started on zocor in hospital. Goal LDL < 70 for diabetics. Obesity class 2.  Hypertension, controlled.  In hospital patient developed leukocytosis, that was found to be secondary to a urinary tract infections with >100k proteus mirabilis. Was started on Rocephin x 7 days.  Patient with resultant left hemiparesis, dysphagia. He has temporary hiccups that resolved without intervention. Physical therapy, occupational therapy and  speech therapy evaluated patient. All agreed inpatient rehab is needed. Patient's wife and family members is/are supportive and can provide care at discharge. CIR bed is available today and patient will be transferred there.  Discharge Exam  Blood pressure 133/95, pulse 78, temperature 97.6 F (36.4 C), temperature source Oral, resp. rate 18, height 5\' 6"  (1.676 m), weight 95.5 kg (210 lb 8.6 oz), SpO2 99.00%. SLEEPING. DOES NOT FULLY WAKE UP TO STIM. NO FACIAL OR ARM TWITCHING.  CARDIOVASCULAR:  Regular rate and rhythm, no murmurs, no carotid bruits  PULMONARY:  COARSE UPPER AIRWAY BREATH SOUNDS, RHONCHI, INTERMITTENT COUGHING  NEUROLOGIC:  MENTAL STATUS: SLEEPING. BRIEFLY OPENS EYES TO STIM.  CRANIAL NERVE: Pupils equal and reactive to light, DECR LEFT LOWER FACIAL STRENGTH  MOTOR: RUE/RLE SPONT.  LUE/LLE WITHDRAWAL TO STIM.   Discharge Diet  Dysphagia 1 pudding thick liquids  Discharge Plan  Disposition:  Transfer to [facility] for ongoing PT, OT and ST  warfarin for secondary stroke prevention.  Ongoing risk factor control by Primary Care Physician. Risk factor recommendations:  Hypertension target range 130-140/70-80 Lipid range - LDL < 100 and checked every 6 months, fasting Diabetes - HgB A1C <7   Follow-up DEFAULT,PROVIDER, MD in 1 month.  Follow-up with Dr. Delia Heady in 2 months.  Greater than 30 minutes were spent preparing discharge.  Signed Annie Main, AVNP, ANP-BC, [facility] Nurse Practitioner 12/27/2011, 1:48 PM I have personally examined this patient, reviewed pertinent data, developed plan of care and agree with above Delia Heady, MD Medical Director [facility] Pager: [phone] 01/01/2012 1:31 PM

## 2011-12-27 NOTE — Progress Notes (Signed)
Speech Language Pathology Dysphagia Treatment Patient Details Name: Brendan Scott MRN: [id number] DOB: Sep 11, 1945 Today's Date: 12/27/2011 Time: 1400-1410 SLP Time Calculation (min): 10 min  Assessment / Plan / Recommendation Clinical Impression  RN paged SLP due to family request.  Family reported to RN that pt. is "supposed to go to rehab; can he have some water?"  Pt. upright in recliner without family present at this time.  Intermittent wet vocal quality present prior to po's consumed.  SLP thickened grape juice to pudding thickness.  Pt. able to hold cup in left hand with moderate tactile assist and independently use right hand to spoon thickend juice to oral cavity.  Hw required total verbal and tactile cues to perform swallow strategies (2nd swallow and intermittent cough).  Intermittent s/s aspiration present such as wet vocal quality and delayed throat clear.  CXR 11/24 reported cardiomegaly with out pna and pt. is afebrile.  SLP did not attempt liquids thinner than pudding consistency due to clinical observations during session.  ST will continue to follow for facilitation with safest diet and upgrade when appropriate.     Diet Recommendation  Continue with Current Diet: Dysphagia 1 (puree);Pudding-thick liquid    SLP Plan Continue with current plan of care       Swallowing Goals  SLP Swallowing Goals Patient will consume recommended diet without observed clinical signs of aspiration with: Moderate assistance Swallow Study Goal #1 - Progress: Progressing toward goal Patient will utilize recommended strategies during swallow to increase swallowing safety with: Moderate assistance Swallow Study Goal #2 - Progress: Progressing toward goal Goal #3: Pt will sustain attention to consumption of upgraded trials of liquid textures with minimal sings of aspiration and min verbal cues.   General Temperature Spikes Noted: No Respiratory Status: Supplemental O2 delivered via  (comment) Behavior/Cognition: Alert;Cooperative;Pleasant mood;Requires cueing Oral Cavity - Dentition: Dentures, top Patient Positioning: Upright in chair  Oral Cavity - Oral Hygiene Does patient have any of the following "at risk" factors?: Other - dysphagia Brush patient's teeth BID with toothbrush (using toothpaste with fluoride): Yes Patient is HIGH RISK - Oral Care Protocol followed (see row info): Yes   Dysphagia Treatment Treatment focused on: Skilled observation of diet tolerance Treatment Methods/Modalities: Skilled observation Patient observed directly with PO's: Yes Type of PO's observed: Pudding-thick liquids Feeding: Needs assist;Able to feed self Liquids provided via: Teaspoon Pharyngeal Phase Signs & Symptoms: Wet vocal quality;Delayed throat clear Type of cueing: Verbal;Tactile;Visual Amount of cueing: Total        Scott Coons [place].Ed [employer] [phone]  12/27/2011

## 2011-12-27 NOTE — Progress Notes (Signed)
Pt admitted to room 4034, Pt arousable, sleepy, pt able to answer simple questions, pt denies pain, grandaughter at bedside, wife will be in tomorrow to sign admit papers, B/P 180/90 after transfer manually will report to oncoming nurse to recheck, Pt oriented to room and unit, call bell in reach

## 2011-12-27 NOTE — Progress Notes (Signed)
Stroke Team Progress Note  HISTORY Brendan Scott is an 66 y.o. male who was last seen normal at 14:30 on 12/14/2101 when he left for work. Patient never showed up to work. Family then found him in driveway unresponsive and called EMS. EMS arrived to find patient standing in driveway slightly confused, left facial droop and dysarthria. Code stroke was called and patient was brought to [facility] hospital. Patient remained with left facial droop, right gaze preference, unable to cross midline, dysarthria and left hemianopsia. CT scan of the head without contrast showed evidence of a fairly large mass lesion involving the left frontotemporal region as well as possible increased density of the right middle cerebral artery proximally, and possibly early MCA territory infarction. Mass lesion on the left was thought to be incidental and likely a meningioma. As such, he was not considered a candidate for intravenous thrombolytic therapy with TPA. Patient was subsequently taken to Interventional Radiology for cerebral arteriogram was performed which showed no signs of occlusion of the right middle cerebral artery nor right internal carotid artery. There was mass effect involving the left middle cerebral artery with reduced diameter and straightening. Chronic permanent tumor blush was also seen. NIH stroke score was 8, including visual field defect and neglect of his left side as well as facial weakness and extremity weakness, and slurred speech. Modified Rankin score was 3. Patient was taking aspirin 81 mg per day prior to admission. He was admitted to the neuro ICU for further evaluation and treatment.  SUBJECTIVE Wife at bedside. They are ready for rehab.  OBJECTIVE Most recent Vital Signs: Filed Vitals:   12/27/11 0214 12/27/11 0530 12/27/11 1015 12/27/11 1016  BP: 142/77 138/74 133/95 133/95  Pulse: 98 100 78   Temp: 98.3 F (36.8 C) 98.1 F (36.7 C) 97.6 F (36.4 C)   TempSrc: Oral Oral Oral   Resp: 17 16 18      Height:      Weight:      SpO2: 100% 100% 99%     IV Fluid Intake:      . sodium chloride 75 mL/hr (12/27/11 1015)   MEDICATIONS     . antiseptic oral rinse  15 mL Mouth Rinse BID  . cefTRIAXone (ROCEPHIN)  IV  1 g Intravenous Q24H  . clonazePAM  0.5 mg Oral QHS  . enoxaparin (LOVENOX) injection  40 mg Subcutaneous Q24H  . feeding supplement  1 Container Oral TID PC & HS  . insulin aspart  0-9 Units Subcutaneous TID WC  . lacosamide (VIMPAT) IV  300 mg Intravenous Q12H  . levETIRAcetam  2,500 mg Oral Q12H  . lisinopril  5 mg Oral Daily  . phenytoin  100 mg Oral TID  . senna-docusate  1 tablet Oral QHS  . simvastatin  20 mg Oral q1800  . [COMPLETED] warfarin  7.5 mg Oral ONCE-1800  . warfarin   Does not apply Once  . Warfarin - Pharmacist Dosing Inpatient   Does not apply q1800  . [DISCONTINUED] clonazePAM  0.5 mg Oral Q12H  . [DISCONTINUED] insulin aspart  0-9 Units Subcutaneous Q4H  . [DISCONTINUED] levetiracetam  2,500 mg Intravenous Q12H  . [DISCONTINUED] LORazepam  2 mg Intravenous Once  . [DISCONTINUED] phenytoin (DILANTIN) IV  100 mg Intravenous Q8H   PRN:  labetalol, midazolam, RESOURCE THICKENUP CLEAR, [DISCONTINUED] fentaNYL  Diet:  Dysphagia 1 pudding thick liquids Activity:  Up with assistance to chair DVT Prophylaxis:  Lovenox 40 mg sq daily   CLINICALLY SIGNIFICANT STUDIES  Basic Metabolic Panel:   Lab 12/24/11 1126  NA 132*  K 3.9  CL 97  CO2 27  GLUCOSE 243*  BUN 14  CREATININE 0.95  CALCIUM 9.5  MG --  PHOS --   Liver Function Tests:   Lab 12/24/11 2018  AST --  ALT --  ALKPHOS --  BILITOT --  PROT --  ALBUMIN 2.5*   CBC:   Lab 12/24/11 1126  WBC 13.6*  NEUTROABS 10.6*  HGB 13.3  HCT 40.0  MCV 80.3  PLT 252   Coagulation:   Lab 12/27/11 0600 12/26/11 0453  LABPROT 20.0* 14.6  INR 1.77* 1.16   Cardiac Enzymes:  No results found for this basename: CKTOTAL:3,CKMB:3,CKMBINDEX:3,TROPONINI:3 in the last 168 hours Urinalysis:    Lab 12/24/11 1200 12/21/11 1028  COLORURINE YELLOW YELLOW  LABSPEC 1.016 1.014  PHURINE 8.5* 8.0  GLUCOSEU 100* NEGATIVE  HGBUR NEGATIVE NEGATIVE  BILIRUBINUR NEGATIVE NEGATIVE  KETONESUR NEGATIVE NEGATIVE  PROTEINUR NEGATIVE NEGATIVE  UROBILINOGEN 1.0 1.0  NITRITE NEGATIVE NEGATIVE  LEUKOCYTESUR NEGATIVE NEGATIVE   Urine Culture 12/24/2011 >100k proteus mirabilis  Lipid Panel    Component Value Date/Time   CHOL 184 12/16/2011 0430   TRIG 51 12/16/2011 0430   HDL 60 12/16/2011 0430   CHOLHDL 3.1 12/16/2011 0430   VLDL 10 12/16/2011 0430   LDLCALC 114* 12/16/2011 0430   HgbA1C  Lab Results  Component Value Date   HGBA1C 6.9* 12/16/2011    Urine Drug Screen:   No results found for this basename: labopia,  cocainscrnur,  labbenz,  amphetmu,  thcu,  labbarb    Alcohol Level: No results found for this basename: ETH:2 in the last 168 hours  CT of the brain   12/20/2011  Evolving cytotoxic edema in the right frontotemporal region. Early hemorrhagic transformation right posterior frontal cortex. No significant mass effect. 3 mm right-to-left shift. 12/16/2011  Exam is markedly motion degraded.  Findings raise possibility of progressive right hemispheric infarct.  Limited for detection of intracranial hemorrhage.  Left anterior left temporal lobe/ clinoid/cavernous sinus region hyperdense lesions suggestive of a meningioma.  This can be evaluated with contrast enhanced MR.  12/15/2011  Right middle cerebral artery slightly dense.  There is minimal hypodensity right opercular region. This may reflect changes of early acute infarct.  Large hyperdense mass centered at the left clinoid with extension into the left middle cranial fossa, left cavernous sinus, left subfrontal region and orbital apex region having appearance suggestive of a meningioma with bony remodeling.  This can be assessed with contrast enhanced MR.   Hypodensity adjacent left frontal lobe may reflect vasogenic edema.   No intracranial hemorrhage.  Prior right mastoidectomy.  Mild thickening along the peripheral aspect of the mastoidectomy site.  Partial opacification of residual right mastoid air cells.   Cerebral Angiogram 12/14/2101 S/P 4 vessel cerebral arteriogram. RT CFA approach. 1. No occlusions ,stenosis dissections or intraluminal filling defects seen . 2. Venous outflow WNLs.  MRI of the brain  12/16/2011   Large right middle cerebral artery distribution non hemorrhagic infarct.  Large left sphenoid wing mass consistent with meningioma with invasion    MRA of the brain  12/16/2011    Circumferential narrowing (caused by a meningioma) of the supraclinoid aspect of the left internal carotid artery, the carotid terminus, the A1 segment of the left anterior cerebral artery and slightly less so the M1 segment of the left middle cerebral artery (which is elevated by the mass).  Decreased caliber/number of visualized  left middle cerebral artery branch vessels may indicate flow limiting stenosis by the meningioma.  Right internal carotid artery/carotid terminus without significant narrowing.  No significant narrowing of the M1 segment or A1 segment of the right middle cerebral artery/anterior cerebral artery respectively.  Right vertebral artery is dominant.  Moderate to marked tandem stenosis left vertebral artery.  High-grade stenosis proximal basilar artery.   2D Echocardiogram  EF 55-60% with no source of embolus. Patient in atrial fibrillation   Carotid Doppler  See cerebral angio  EEG 11/17/2013This is a normal EEG recording during wakefulness. No evidence of an epileptic disorder was demonstrated. Rhythmic muscle activity consistent with tremor was seen frequently throughout the  record, as described above 12/21/2011-This is an EEG that is characterized by slow activity. This likely represents drowse. No focal slowing is noted. No epileptiform activity is noted.  CXR   12/24/2011 Cardiomegaly without CHF  or pneumonia 12/15/2011   Mild cardiac enlargement.  No active infiltrates or failure.   EKG  ATRIAL FIBRILLATION, V-RATE 68- 92 ~ var'd rate, irreg atrial activity  LATERAL INFARCT, OLD ~ Q>57mS, abnormal ST-T, I aVL V5-6  ABNRM R PROG, CONSIDER ASMI OR LEAD PLACEMENT ~ Q >56mS, diminished R, V2  Therapy Recommendations PT - CIR; OT - CIR  GENERAL EXAM: SLEEPING. DOES NOT FULLY WAKE UP TO STIM. NO FACIAL OR ARM TWITCHING. CARDIOVASCULAR: Regular rate and rhythm, no murmurs, no carotid bruits PULMONARY: COARSE UPPER AIRWAY BREATH SOUNDS, RHONCHI, INTERMITTENT COUGHING NEUROLOGIC: MENTAL STATUS: SLEEPING. BRIEFLY OPENS EYES TO STIM. CRANIAL NERVE: Pupils equal and reactive to light, DECR LEFT LOWER FACIAL STRENGTH MOTOR: RUE/RLE SPONT. LUE/LLE WITHDRAWAL TO STIM.   ASSESSMENT Mr. FREDERICO VENEMAN is a 66 y.o. male presenting with confusion, left facial droop and dysarthria. MRI confirms a large right middle cerebral artery distribution non hemorrhagic infarct. 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  Newly identified left frontal and anterior temporal meningioma. Infarct likely embolic infarct given atrial fibrillation. On aspirin 81 mg orally every day prior to admission. Now on warfarin for secondary stroke prevention.   Atrial fibrillation Diabetes, HgbA1c 6.9 Hyperlipidemia, LDL 114, not on statin PTA, goal LDL < 70 for diabetics. On fish oil PTA. Now on zocor Obesity class 2, Body mass index is 33.98 kg/(m^2). Focal status epilepticus with ongoing simple partial seizures involving left side of his face, EEG without epileptiform discharges, on dilantin, vimpat, keppra, and klonopin. seizures stopped 12/23/11 am. Resumes 12/25/2011 x 6 min. Dysphagia secondary to stroke/seizure, passed swallow eval this am Hypertension Cytotoxic edema Leukocytosis, increasing. CXR without infections. Urinalysis  normal 11/21, Urine Culture >100k proteus mirabilis. ? Aspiration as well. Rocephin D2/7 New hiccups, resolved without intervention  Hospital day # 12  TREATMENT/PLAN  Continue coumadin   CONTINUE dilantin, vimpat, keppra, and klonopin for seizure control  Klonopin reduced Sunday d/t lethargy. Will continue at hs only d/t sedating properties as long as seizures ceased  Change vimpat IV to po  Medically ready for discharge to rehab  Resume metformin  Annie Main, MSN, RN, ANVP-BC, ANP-BC, GNP-BC [facility] Pager: [phone] 12/27/2011 10:18 AM  I have personally examined this patient, reviewed pertinent data, developed plan of care and discussed with his wife and answered questions.  Delia Heady, MD Medical Director [facility] Pager: [phone] 12/27/2011 10:18 AM

## 2011-12-28 DIAGNOSIS — I639 Cerebral infarction, unspecified: Secondary | ICD-10-CM

## 2011-12-28 DIAGNOSIS — I6330 Cerebral infarction due to thrombosis of unspecified cerebral artery: Secondary | ICD-10-CM

## 2011-12-28 LAB — PROTIME-INR
INR: 3.07 — ABNORMAL HIGH (ref 0.00–1.49)
Prothrombin Time: 30.1 seconds — ABNORMAL HIGH (ref 11.6–15.2)

## 2011-12-28 LAB — CULTURE, RESPIRATORY W GRAM STAIN

## 2011-12-28 LAB — GLUCOSE, CAPILLARY: Glucose-Capillary: 154 mg/dL — ABNORMAL HIGH (ref 70–99)

## 2011-12-28 MED ORDER — METFORMIN HCL 500 MG PO TABS
500.0000 mg | ORAL_TABLET | Freq: Every day | ORAL | Status: DC
  Administered 2011-12-28 – 2012-01-23 (×27): 500 mg via ORAL
  Filled 2011-12-28 (×30): qty 1

## 2011-12-28 NOTE — Progress Notes (Signed)
ANTICOAGULATION CONSULT NOTE - Follow Up Consult  Pharmacy Consult for Coumadin Indication: atrial fibrillation  No Known Allergies  Patient Measurements:   Ht: 66", Wt: 95.5 Kg  Vital Signs: Temp: 98.5 F (36.9 C) (11/28 0645) Temp src: Oral (11/28 0645) BP: 146/89 mmHg (11/28 0645) Pulse Rate: 68  (11/28 0645)  Labs:  Basename 12/28/11 0649 12/27/11 0600 12/26/11 0453  HGB -- -- --  HCT -- -- --  PLT -- -- --  APTT -- -- --  LABPROT 30.1* 20.0* 14.6  INR 3.07* 1.77* 1.16  HEPARINUNFRC -- -- --  CREATININE -- -- --  CKTOTAL -- -- --  CKMB -- -- --  TROPONINI -- -- --    The CrCl is unknown because both a height and weight (above a minimum accepted value) are required for this calculation.   Assessment: Mr. Rosendale is a 66 y/o male patient with a history of afib who was on ASA 81 mg PTA admitted with stroke, presenting outside of window for TPA.  Patient with suspected meningioma on CT scan. Coumadin per pharmacy started 11/25 and has received 3 doses of 7.5mg  to date. INR now supratherapeutic following abrupt increase, noted drug interaction with dilantin (may increase INR). RN reports no bleeding noted, LMWH dose was given this AM.  Mr. Bisaillon also had seizures this admit, which were initially persistent on 3-drug combo, but is now controlled on keppra/lacosamide and phenytoin, also continues on scheduled clonazepam. Recent corrected dilantin level at 14.7 (11/24). Noted drug interaction with warfarin (increased levels).  Goal of Therapy:  INR 2-3 Monitor platelets by anticoagulation protocol: Yes   Plan:  - No Coumadin today - Will f/up daily INR - Will d/c Lovenox for now. Would only restart if INR drops below 2 - Will monitor phenytoin along with you, consider ordering level on Monday and weekly.  Thanks, Arnesia Vincelette K. Allena Katz, PharmD, BCPS.  Clinical Pharmacist Pager [phone]. 12/28/2011 11:04 AM

## 2011-12-28 NOTE — Progress Notes (Signed)
Pt became agitated, getting out of bed yelling wifes name, Betty(wife) called and talked to pt, Kathie Rhodes will be coming to see pt tonight

## 2011-12-28 NOTE — H&P (Signed)
Physical Medicine and Rehabilitation Admission H&P      Chief Complaint   Patient presents with   .  Code Stroke    : HPI: Brendan Scott is a 66 y.o. right-handed male with history of atrial fibrillation on aspirin therapy prior to admission as well as diabetes mellitus. Admitted 12/15/2011 after being found in his driveway by family unresponsive. EMS arrived and noted patient to be slightly confused with left facial droop and dysarthria. MRI of the brain showed a large right middle cerebral artery distribution nonhemorrhagic infarct as well as incidental findings of large left sphenoid wing mass consistent with meningioma. He was not considered a candidate for intravenous thrombolytic therapy with TPA. MRA of the head showed high-grade stenosis proximal basilar artery. Echocardiogram with ejection fraction of 60% and no wall motion abnormalities without emboli. Cerebral angiogram showed no occlusions or stenosis. There was reported seizure 12/16/2011 and patient has been loaded with Keppra /Dilantin as well as vimpat. EEG did not show definite epileptiform discharges. Throughout his hospital course there as been recurrent seizures and has since resolved as of 12/23/2011 Neurology services consulted presently maintained on aspirin therapy as well as subcutaneous Lovenox for DVT prophylaxis. On 11/25 the EKG consistent with atrial fibrillation with cardiac enzymes negative. Coumadin was initiated 12/25/2011 and aspirin was discontinued. He would remain on Lovenox for bridging until INR greater than 2.00.  Speech therapy followup for dysphagia and diet has slowly been advanced to a dysphagia 1 pudding thick liquid diet with latest swallow study 12/25/2011. He continued with IV fluids for hydration secondary to pudding thick liquids. Urine culture 12/24/2011 greater than 100,000 Proteus and placed on intravenous Rocephin 12/25/2011 x7 days. Physical and occupational therapy evaluations completed an ongoing  with recommendations for physical medicine rehabilitation consult to consider inpatient rehabilitation services   Review of Systems   Cardiovascular: Positive for palpitations.   Gastrointestinal: Positive for constipation.   Musculoskeletal: Positive for myalgias and joint pain.   All other systems reviewed and are negative      Past Medical History   Diagnosis  Date   .  Arthritis     .  Diabetes mellitus     .  Atrial fibrillation      Past Surgical History   Procedure  Date   .  Total knee arthroplasty         Bilateral    Family History   Problem  Relation  Age of Onset   .  Arthritis       .  Diabetes        Social History: reports that he has quit smoking. He does not have any smokeless tobacco history on file. He reports that he does not drink alcohol or use illicit drugs. Allergies: No Known Allergies Medications Prior to Admission   Medication  Sig  Dispense  Refill   .  [DISCONTINUED] aspirin 81 MG tablet  Take 81 mg by mouth daily.           .  [DISCONTINUED] fish oil-omega-3 fatty acids 1000 MG capsule  Take 2 g by mouth daily.           .  [DISCONTINUED] metFORMIN (GLUCOPHAGE) 500 MG tablet  Take 500 mg by mouth daily.             Home: Home Living Lives With: Spouse Available Help at Discharge: Family;Available 24 hours/day Type of Home: Mobile home Home Access: Stairs to enter Entrance Stairs-Number of Steps: 4 Entrance  Stairs-Rails: Right;Left;Can reach both Home Layout: One level Bathroom Shower/Tub: Walk-in shower;Door [employer] Toilet: Standard Bathroom Accessibility: Yes How Accessible: Accessible via walker Home Adaptive Equipment: Walker - rolling;Shower chair without back    Functional History: Prior Function Able to Take Stairs?: Yes Driving: Yes Vocation: Full time employment Comments: custodian   Functional Status:   Mobility: Bed Mobility Bed Mobility: Supine to Sit Rolling Right: 1: +2 Total assist Rolling Right: Patient  Percentage: 40% Rolling Left: 3: Mod assist Left Sidelying to Sit: 1: +2 Total assist Left Sidelying to Sit: Patient Percentage: 50% Supine to Sit: 4: Min assist Supine to Sit: Patient Percentage: 30% Sitting - Scoot to Edge of Bed: 2: Max assist Sit to Supine: 1: +2 Total assist;HOB flat Sit to Supine: Patient Percentage: 30% Scooting to HOB: 1: +2 Total assist Scooting to [facility]: Patient Percentage: 0% Transfers Transfers: Sit to Stand;Stand to Sit;Stand Pivot Transfers Sit to Stand: 1: +2 Total assist;From bed;From chair/3-in-1 Sit to Stand: Patient Percentage: 40% Stand to Sit: 1: +2 Total assist;To bed;To chair/3-in-1 Stand to Sit: Patient Percentage: 40% Stand Pivot Transfers: 1: +2 Total assist Stand Pivot Transfers: Patient Percentage: 60% Ambulation/Gait Ambulation/Gait Assistance: Not tested (comment) Ambulation/Gait: Patient Percentage: 60% Ambulation Distance (Feet): 8 Feet Assistive device: 2 person hand held assist Ambulation/Gait Assistance Details: +2 (A) to maintain balance with max VC, tactile and manual cues for appropriate step sequence and proper weight shifts to advance LE.  Manual cues to prevent occasional left knee buckling. Gait Pattern: Step-to pattern;Decreased stride length;Shuffle;Scissoring;Narrow base of support Stairs: No Wheelchair Mobility Wheelchair Mobility: No   ADL: ADL Grooming: Wash/dry hands;Minimal assistance Where Assessed - Grooming: Supported sitting Upper Body Bathing: Maximal assistance Where Assessed - Upper Body Bathing: Supine, head of bed up Lower Body Bathing: +1 Total assistance Where Assessed - Lower Body Bathing: Rolling right and/or left (Max assist required to roll ) Upper Body Dressing: Maximal assistance Where Assessed - Upper Body Dressing: Supported sitting Lower Body Dressing: +1 Total assistance (don socks) Where Assessed - Lower Body Dressing: Supported sitting Toilet Transfer: +2 Total assistance Toilet  Transfer Method: Stand pivot Acupuncturist: Bedside commode Equipment Used: Gait belt Transfers/Ambulation Related to ADLs: +2total A-pt=40% sit to stand and 60% stand pivot transfer from bed to bedside to chair- pt more participatory today. Able to advance feet sideways with assist to move RLE.   ADL Comments: More alert this session. Able to perform dynamic reaching activities EOB x with Min A to cross midline with LUE and to return to midline   Cognition: Cognition Overall Cognitive Status: Impaired Arousal/Alertness: Awake/alert Orientation Level: Oriented X4 Attention: Focused;Sustained Focused Attention: Appears intact Sustained Attention: Impaired Sustained Attention Impairment: Verbal basic;Functional basic Memory:  (NT) Awareness: Impaired Awareness Impairment: Emergent impairment Problem Solving: Impaired Problem Solving Impairment: Verbal basic;Functional basic Executive Function: Reasoning;Self Monitoring Reasoning: Impaired Reasoning Impairment: Functional basic;Verbal basic Self Monitoring: Impaired Self Monitoring Impairment: Functional basic Safety/Judgment: Impaired Cognition Overall Cognitive Status: Impaired Area of Impairment: Attention;Safety/judgement;Problem solving Difficult to assess due to: Level of arousal Arousal/Alertness: Awake/alert Orientation Level: Time;Disoriented to Behavior During Session: Lethargic Current Attention Level: Sustained (~10 seconds) Safety/Judgement: Decreased awareness of safety precautions;Decreased safety judgement for tasks assessed Safety/Judgement - Other Comments: decreased awareness of left sided weakness Problem Solving: Pt slow to process and needs extra time to complete task. Cognition - Other Comments: Pt easily distracted and needs cues for redirection to task.      Blood pressure 133/95, pulse 78, temperature 97.6 F (  36.4 C), temperature source Oral, resp. rate 18, height 5\' 6"  (1.676 m),  weight 95.5 kg (210 lb 8.6 oz), SpO2 99.00%. Physical Exam  Vitals reviewed.   HENT:  White coating over tongue Head: Normocephalic.   Eyes:  Pupils   react to light   Neck: Neck supple. No thyromegaly present.   Cardiovascular:  Cardiac rate controlled , no murmur, regular Pulmonary/Chest: Effort normal and breath sounds normal. No respiratory distress.   Abdominal: Soft. Bowel sounds are normal. There is no tenderness.  Neurological:  Patient is fairly alert. A little restless and impulsive. His speech is dysarthric.left central 7 and 12.  Left inattention noted. He has 1/5 strength at deltoid, bicep, tricep, trace/absent at HI and wrist. LLE is grossly 2 to 2+ proximally at hip, knee and 0-1 at the ankle. Senses pain and light touch on the left side. dtr's are 2+ on the left     Results for orders placed during the hospital encounter of 12/15/11 (from the past 48 hour(s))   GLUCOSE, CAPILLARY     Status: Abnormal     Collection Time     12/25/11  4:25 PM       Component  Value  Range  Comment     Glucose-Capillary  140 (*)  70 - 99 mg/dL     GLUCOSE, CAPILLARY     Status: Abnormal     Collection Time     12/25/11  8:08 PM       Component  Value  Range  Comment     Glucose-Capillary  180 (*)  70 - 99 mg/dL       Comment 1  Notify RN          Comment 2  Documented in Chart        GLUCOSE, CAPILLARY     Status: Abnormal     Collection Time     12/26/11 12:20 AM       Component  Value  Range  Comment     Glucose-Capillary  139 (*)  70 - 99 mg/dL       Comment 1  Documented in Chart          Comment 2  Notify RN        GLUCOSE, CAPILLARY     Status: Abnormal     Collection Time     12/26/11  3:53 AM       Component  Value  Range  Comment     Glucose-Capillary  139 (*)  70 - 99 mg/dL       Comment 1  Notify RN          Comment 2  Documented in Chart        PROTIME-INR     Status: Normal     Collection Time     12/26/11  4:53 AM       Component  Value  Range  Comment      Prothrombin Time  14.6   11.6 - 15.2 seconds       INR  1.16   0.00 - 1.49     GLUCOSE, CAPILLARY     Status: Abnormal     Collection Time     12/26/11  8:12 AM       Component  Value  Range  Comment     Glucose-Capillary  145 (*)  70 - 99 mg/dL       Comment 1  Documented in Chart  Comment 2  Notify RN        GLUCOSE, CAPILLARY     Status: Abnormal     Collection Time     12/26/11 11:34 AM       Component  Value  Range  Comment     Glucose-Capillary  226 (*)  70 - 99 mg/dL       Comment 1  Documented in Chart          Comment 2  Notify RN        GLUCOSE, CAPILLARY     Status: Abnormal     Collection Time     12/26/11  5:15 PM       Component  Value  Range  Comment     Glucose-Capillary  168 (*)  70 - 99 mg/dL     GLUCOSE, CAPILLARY     Status: Abnormal     Collection Time     12/26/11 10:31 PM       Component  Value  Range  Comment     Glucose-Capillary  155 (*)  70 - 99 mg/dL     PROTIME-INR     Status: Abnormal     Collection Time     12/27/11  6:00 AM       Component  Value  Range  Comment     Prothrombin Time  20.0 (*)  11.6 - 15.2 seconds       INR  1.77 (*)  0.00 - 1.49      No results found.   Post Admission Physician Evaluation: Functional deficits secondary  to embolic right MCA infarct Patient is admitted to receive collaborative, interdisciplinary care between the physiatrist, rehab nursing staff, and therapy team. Patient's level of medical complexity and substantial therapy needs in context of that medical necessity cannot be provided at a lesser intensity of care such as a SNF. Patient has experienced substantial functional loss from his/her baseline which was documented above under the "Functional History" and "Functional Status" headings.  Judging by the patient's diagnosis, physical exam, and functional history, the patient has potential for functional progress which will result in measurable gains while on inpatient rehab.  These gains will be of  substantial and practical use upon discharge  in facilitating mobility and self-care at the household level. Physiatrist will provide 24 hour management of medical needs as well as oversight of the therapy plan/treatment and provide guidance as appropriate regarding the interaction of the two. 24 hour rehab nursing will assist with bladder management, bowel management, safety, skin/wound care, disease management, medication administration, pain management and patient education  and help integrate therapy concepts, techniques,education, etc. PT will assess and treat for:  Lower extremity strength, range of motion, stamina, balance, functional mobility, safety, adaptive techniques and equipment, NMR, visuospatial awareness, education.  Goals are: min assist. OT will assess and treat for: ADL's, functional mobility, safety, upper extremity strength, adaptive techniques and equipment, NMR, visuo-spatial awareness, education.   Goals are: min to mod assist. SLP will assess and treat for: cognition, swallowing, communication.  Goals are: supervision. Case Management and Social Worker will assess and treat for psychological issues and discharge planning. Team conference will be held weekly to assess progress toward goals and to determine barriers to discharge. Patient will receive at least 3 hours of therapy per day at least 5 days per week. ELOS: 3 weeks      Prognosis:  excellent     Medical Problem List and Plan: 1. large  right middle sugar artery distribution nonhemorrhagic infarct 2. DVT Prophylaxis/Anticoagulation: Chronic Coumadin therapy with INR 1.77 12/27/2011. Continue Lovenox 40 mg daily Until INR 2.00. Monitor platelet counts any signs of bleeding 3. Neuropsych: This patient is not capable of making decisions on his/her own behalf. 4. Recurrent seizures. EEG was negative. Continue Klonopin 0.5 mg each bedtime, vimpat 300mg  bid,keppra 2500mg  Q12hour, Dilantin 100 mg 3 times a day. Monitor  closely for a seizure activity 5. Hypertension/atrial fibrillation of new onset. Lisinopril 5 mg daily. Cardiac rate is controlled. There's been no reports of chest pain or shortness of breath 6. Proteus UTI. Intravenous Rocephin 1 g daily x7 days initiated 12/25/2011 7. Dysphagia. Dysphagia 1 pudding thick liquids. Continue intravenous fluids for hydration 8. Non-insulin-dependent diabetes mellitus: Hemoglobin A1c 6.9.. Patient currently with sliding scale insulin. Patient was taking Glucophage 500 mg daily prior to admission. Sugars controlled suboptimally at present.  -resume gluchopage at 500mg  qday    Ivory Broad, MD

## 2011-12-29 ENCOUNTER — Inpatient Hospital Stay (HOSPITAL_COMMUNITY): Payer: BC Managed Care – PPO | Admitting: Occupational Therapy

## 2011-12-29 ENCOUNTER — Inpatient Hospital Stay (HOSPITAL_COMMUNITY): Payer: BC Managed Care – PPO | Admitting: Speech Pathology

## 2011-12-29 ENCOUNTER — Inpatient Hospital Stay (HOSPITAL_COMMUNITY): Payer: BC Managed Care – PPO | Admitting: Physical Therapy

## 2011-12-29 DIAGNOSIS — I6340 Cerebral infarction due to embolism of unspecified cerebral artery: Secondary | ICD-10-CM

## 2011-12-29 DIAGNOSIS — G8110 Spastic hemiplegia affecting unspecified side: Secondary | ICD-10-CM

## 2011-12-29 LAB — CBC WITH DIFFERENTIAL/PLATELET
Basophils Relative: 0 % (ref 0–1)
HCT: 40.4 % (ref 39.0–52.0)
Hemoglobin: 12.6 g/dL — ABNORMAL LOW (ref 13.0–17.0)
Lymphs Abs: 1.1 10*3/uL (ref 0.7–4.0)
MCH: 25.4 pg — ABNORMAL LOW (ref 26.0–34.0)
MCHC: 31.2 g/dL (ref 30.0–36.0)
Monocytes Absolute: 1 10*3/uL (ref 0.1–1.0)
Monocytes Relative: 13 % — ABNORMAL HIGH (ref 3–12)
Neutro Abs: 5.4 10*3/uL (ref 1.7–7.7)
Neutrophils Relative %: 71 % (ref 43–77)
RBC: 4.96 MIL/uL (ref 4.22–5.81)

## 2011-12-29 LAB — COMPREHENSIVE METABOLIC PANEL
Albumin: 2.7 g/dL — ABNORMAL LOW (ref 3.5–5.2)
Alkaline Phosphatase: 107 U/L (ref 39–117)
BUN: 14 mg/dL (ref 6–23)
CO2: 26 mEq/L (ref 19–32)
Chloride: 103 mEq/L (ref 96–112)
Creatinine, Ser: 1.09 mg/dL (ref 0.50–1.35)
GFR calc Af Amer: 80 mL/min — ABNORMAL LOW (ref >90)
GFR calc non Af Amer: 69 mL/min — ABNORMAL LOW (ref >90)
Glucose, Bld: 151 mg/dL — ABNORMAL HIGH (ref 70–99)
Potassium: 3.7 mEq/L (ref 3.5–5.1)
Total Bilirubin: 0.2 mg/dL — ABNORMAL LOW (ref 0.3–1.2)

## 2011-12-29 LAB — PROTIME-INR
INR: 2.52 — ABNORMAL HIGH (ref 0.00–1.49)
Prothrombin Time: 26 seconds — ABNORMAL HIGH (ref 11.6–15.2)

## 2011-12-29 LAB — GLUCOSE, CAPILLARY
Glucose-Capillary: 165 mg/dL — ABNORMAL HIGH (ref 70–99)
Glucose-Capillary: 116 mg/dL — ABNORMAL HIGH (ref 70–99)
Glucose-Capillary: 189 mg/dL — ABNORMAL HIGH (ref 70–99)

## 2011-12-29 MED ORDER — SODIUM CHLORIDE 0.45 % IV SOLN
INTRAVENOUS | Status: DC
  Administered 2011-12-29 – 2012-01-07 (×11): via INTRAVENOUS

## 2011-12-29 MED ORDER — WARFARIN SODIUM 5 MG PO TABS
5.0000 mg | ORAL_TABLET | Freq: Once | ORAL | Status: AC
  Administered 2011-12-29: 5 mg via ORAL
  Filled 2011-12-29: qty 1

## 2011-12-29 MED ORDER — CEPHALEXIN 250 MG PO CAPS
250.0000 mg | ORAL_CAPSULE | Freq: Three times a day (TID) | ORAL | Status: AC
  Administered 2011-12-29 – 2012-01-02 (×15): 250 mg via ORAL
  Filled 2011-12-29 (×18): qty 1

## 2011-12-29 NOTE — Evaluation (Signed)
Speech Language Pathology Assessment and Plan  Patient Details  Name: DENT PLANTZ MRN: [id number] Date of Birth: [birth year]  SLP Diagnosis: Dysarthria;Cognitive Impairments;Dysphagia  Rehab Potential: Good ELOS: 3-4 weeks   Today's Date: 12/29/2011 Time: 1400-1500 Time Calculation (min): 60 min  Skilled Therapeutic Intervention: Administered cognitive-linguistic evaluation and BSE. Please see below for details. Full BSE was not complete due to incontinent episode that required immediate attention.   Problem List:  Patient Active Problem List  Diagnosis  . DIABETES  . OSTEOARTHRITIS, LOWER LEG  . OSTEOARTHRITIS, KNEE, LEFT  . JOINT EFFUSION, KNEE  . Pain in Joint, Lower Leg  . SCIATICA, RIGHT  . TOTAL KNEE FOLLOW-UP  . Pain, knee  . S/P total knee replacement  . Diabetes mellitus  . Focal motor seizure  . Embolic stroke involving middle cerebral artery  . Aspiration into respiratory tract  . Respiratory distress  . CVA (cerebral infarction)   Past Medical History:  Past Medical History  Diagnosis Date  . Arthritis   . Diabetes mellitus   . Atrial fibrillation    Past Surgical History:  Past Surgical History  Procedure Date  . Total knee arthroplasty     Bilateral    Assessment / Plan / Recommendation Clinical Impression  Patient is a 66 y.o. right-handed male with history of atrial fibrillation on aspirin therapy prior to admission as well as diabetes mellitus. Admitted 12/15/2011 after being found in his driveway by family unresponsive. EMS arrived and noted patient to be slightly confused with left facial droop and dysarthria. MRI of the brain showed a large right middle cerebral artery distribution nonhemorrhagic infarct as well as incidental findings of large left sphenoid wing mass consistent with meningioma. He was not considered a candidate for intravenous thrombolytic therapy with TPA. MRA of the head showed high-grade stenosis proximal basilar artery.  Echocardiogram with ejection fraction of 60% and no wall motion abnormalities without emboli. Cerebral angiogram showed no occlusions or stenosis. There was reported seizure 12/16/2011 and patient has been loaded with Keppra /Dilantin as well as vimpat. EEG did not show definite epileptiform discharges. Throughout his hospital course there as been recurrent seizures and has since resolved as of 12/23/2011 Neurology services consulted presently maintained on aspirin therapy as well as subcutaneous Lovenox for DVT prophylaxis. On 11/25 the EKG consistent with atrial fibrillation with cardiac enzymes negative. Coumadin was initiated 12/25/2011 and aspirin was discontinued. Patient transferred to CIR on 12/27/2011. Pt presents with severe cognitive deficits characterized by decreased sustained attention, working memory, intellectual awareness, safety awareness and basic functional and verbal problem solving. Pt has left visual inattention and demonstrates perseverative behavior with functional conversation and functional motor tasks. Pt demonstrates mild dysarthria due to left oral weakness and demonstrates a moderate oral-pharyngeal dysphagia and requires max cueing for utilization of swallowing compensatory strategies. Recommend pt continue with current diet of Dys. 1 textures and pudding thick liquids. Pt would benefit from skilled SLP intervention to maximize cognitive function, functional communication and swallowing function with least restrictive diet to maximize functional independence. Anticipate pt will need 24 hour supervision and f/u home health therapy.     SLP Assessment  Patient will need skilled Speech Lanaguage Pathology Services during CIR admission    Recommendations  Follow up Recommendations: Home Health SLP Equipment Recommended: None recommended by SLP    SLP Frequency 1-2 X/day, 30-60 minutes   SLP Treatment/Interventions Cognitive remediation/compensation;Cueing  hierarchy;Dysphagia/aspiration precaution training;Functional tasks;Internal/external aids;Environmental controls;Patient/family education;Therapeutic Activities;Oral motor exercises;Speech/Language facilitation    Pain  Pain Assessment Pain Assessment: No/denies pain Prior Functioning Type of Home: Mobile home Lives With: Spouse Available Help at Discharge: Family;Available PRN/intermittently (wife works, unknown if someone can be with patient daily) Vocation: Full time employment  Short Term Goals: Week 1: SLP Short Term Goal 1 (Week 1): Pt will self-monitor and correct left anterior spillage of secretions with Mod verbal and question cues.  SLP Short Term Goal 2 (Week 1): Pt will utilize swallowing compensatory strategies of multiple swallows and small bites with current diet textures with Mod A verbal cues.  SLP Short Term Goal 3 (Week 1): Pt will demonstrate sustained attention to a functional task for 5 minutes with Mod A verbal and question cues for redirection SLP Short Term Goal 4 (Week 1): Pt will attend to left enviornment/body with Mod A tactile and verbal cues.  SLP Short Term Goal 5 (Week 1): Pt will maintain topic of conversation for ~3 turns with Max A verbal and question cues for redirection.  SLP Short Term Goal 6 (Week 1): Pt will identify 1 physical and 1 cognitive deficit with Mod A semantic cues.   See FIM for current functional status Refer to Care Plan for Long Term Goals  Recommendations for other services: None  Discharge Criteria: Patient will be discharged from SLP if patient refuses treatment 3 consecutive times without medical reason, if treatment goals not met, if there is a change in medical status, if patient makes no progress towards goals or if patient is discharged from hospital.  The above assessment, treatment plan, treatment alternatives and goals were discussed and mutually agreed upon: by patient  Mitchel Delduca 12/29/2011, 3:36 PM

## 2011-12-29 NOTE — Evaluation (Signed)
Occupational Therapy Assessment and Plan  Patient Details  Name: Brendan Scott MRN: [id number] Date of Birth: 01-18-1946  OT Diagnosis: abnormal posture, apraxia, cognitive deficits and hemiplegia affecting non-dominant side Rehab Potential: Rehab Potential: Good ELOS: 3-4 days   Today's Date: 12/29/2011 Time: 0930-1030 Time Calculation (min): 60 min  1:1 Pt seen for initial evaluation and ADL retraining from w/c at sink level. Pt needed 2 person assist with transfers and sit to partial stand at sink to wash bottom and pull up pants. Pt needed total assist with max cues due to severe apraxia.    Problem List:  Patient Active Problem List  Diagnosis  . DIABETES  . OSTEOARTHRITIS, LOWER LEG  . OSTEOARTHRITIS, KNEE, LEFT  . JOINT EFFUSION, KNEE  . Pain in Joint, Lower Leg  . SCIATICA, RIGHT  . TOTAL KNEE FOLLOW-UP  . Pain, knee  . S/P total knee replacement  . Diabetes mellitus  . Focal motor seizure  . Embolic stroke involving middle cerebral artery  . Aspiration into respiratory tract  . Respiratory distress  . CVA (cerebral infarction)    Past Medical History:  Past Medical History  Diagnosis Date  . Arthritis   . Diabetes mellitus   . Atrial fibrillation    Past Surgical History:  Past Surgical History  Procedure Date  . Total knee arthroplasty     Bilateral    Assessment & Plan Clinical Impression:  66 y.o. right-handed male with history of atrial fibrillation on aspirin therapy prior to admission as well as diabetes mellitus. Admitted 12/15/2011 after being found in his driveway by family unresponsive. EMS arrived and noted patient to be slightly confused with left facial droop and dysarthria. MRI of the brain showed a large right middle cerebral artery distribution nonhemorrhagic infarct as well as incidental findings of large left sphenoid wing mass consistent with meningioma. He was not considered a candidate for intravenous thrombolytic therapy with TPA. MRA of  the head showed high-grade stenosis proximal basilar artery. Echocardiogram with ejection fraction of 60% and no wall motion abnormalities without emboli. Cerebral angiogram showed no occlusions or stenosis. There was reported seizure 12/16/2011 and patient has been loaded with Keppra /Dilantin as well as vimpat. EEG did not show definite epileptiform discharges. Throughout his hospital course there as been recurrent seizures and has since resolved as of 12/23/2011 Neurology services consulted presently maintained on aspirin therapy as well as subcutaneous Lovenox for DVT prophylaxis. On 11/25 the EKG consistent with atrial fibrillation with cardiac enzymes negative. Coumadin was initiated 12/25/2011 and aspirin was discontinued. He would remain on Lovenox for bridging until INR greater than 2.00. Speech therapy followup for dysphagia and diet has slowly been advanced to a dysphagia 1 pudding thick liquid diet with latest swallow study 12/25/2011. He continued with IV fluids for hydration secondary to pudding thick liquids. Patient transferred to CIR on 12/27/2011 .    Patient currently requires total with basic self-care skills secondary to muscle weakness, abnormal tone and motor apraxia, decreased attention to left, decreased initiation, decreased awareness, decreased problem solving and delayed processing and decreased sitting balance, decreased standing balance and decreased postural control.  Prior to hospitalization, patient was fully independent with all ADLs, driving, and working.  Patient will benefit from skilled intervention to increase independence with basic self-care skills prior to discharge home with care partner.  Anticipate patient will require 24 hour supervision and follow up home health.  OT - End of Session Activity Tolerance: Tolerates < 10 min activity, no  significant change in vital signs Endurance Deficit: Yes Endurance Deficit Description: pt became lethargic after completing  basic ADLs OT Assessment Rehab Potential: Good Barriers to Discharge: None OT Plan OT Frequency: 1-2 X/day, 60-90 minutes;5 out of 7 days Estimated Length of Stay: 3-4 days OT Treatment/Interventions: Balance/vestibular training;Cognitive remediation/compensation;Discharge planning;DME/adaptive equipment instruction;Functional mobility training;Patient/family education;Neuromuscular re-education;Self Care/advanced ADL retraining;Therapeutic Activities;UE/LE Strength taining/ROM;Therapeutic Exercise;UE/LE Coordination activities;Visual/perceptual remediation/compensation OT Recommendation Follow Up Recommendations: Home health OT Equipment Details: to be determined  OT Evaluation Precautions/Restrictions  Precautions Precautions: Fall Restrictions Weight Bearing Restrictions: No   Pain Pain Assessment Pain Assessment: No/denies pain Home Living/Prior Functioning Home Living Lives With: Spouse Available Help at Discharge: Other (Comment) (pt stated that his wife works, but has other help) Type of Home: Mobile home Entrance Stairs-Number of Steps: 4 Home Layout: One level Bathroom Shower/Tub: Walk-in shower;Door Home Adaptive Equipment: Walker - rolling;Shower chair without back;Bedside commode/3-in-1 Prior Function Level of Independence: Independent with basic ADLs;Independent with homemaking with ambulation Able to Take Stairs?: Yes Driving: Yes Vocation: Full time employment Vocation Requirements: janitorial work ADL  Total assist with majority of ADLs Vision/Perception  Vision - History Baseline Vision: Wears glasses only for reading Patient Visual Report: No change from baseline Vision - Assessment Vision Assessment: Vision tested Tracking/Visual Pursuits: Decreased smoothness of horizontal tracking;Decreased smoothness of vertical tracking Saccades: Overshoots Visual Fields: Other (comment);Impaired - to be further tested in functional context (pt having difficulty  with instructions) Perception Perception: Impaired Inattention/Neglect: Does not attend to left side of body Praxis Praxis: Impaired Praxis Impairment Details: Motor planning Praxis-Other Comments: severely impaired with motor movements with transfers, dressing skills  Cognition Overall Cognitive Status: Impaired Arousal/Alertness: Lethargic Orientation Level: Oriented to person;Oriented to place;Oriented to situation Attention: Sustained Sustained Attention: Impaired Sustained Attention Impairment: Verbal basic;Functional basic Sensation Sensation Additional Comments: unable to fully assess at eval due to lethargy Coordination Gross Motor Movements are Fluid and Coordinated: No Fine Motor Movements are Fluid and Coordinated: No Coordination and Movement Description: severe apraxia and LUE/LLE hypertone Motor  Motor Motor: Abnormal tone;Motor perseverations;Motor apraxia;Motor impersistence Mobility  Total assist x 2 squat pivot transfers    Trunk/Postural Assessment  Cervical Assessment Cervical Assessment: Within Functional Limits Thoracic Assessment Thoracic Assessment: Within Functional Limits Lumbar Assessment Lumbar Assessment: Within Functional Limits Postural Control Postural Control: Deficits on evaluation Trunk Control: pt leans back and needs max assist to flex his trunk due to hypertone and apraxia  Balance Static Sitting Balance Static Sitting - Level of Assistance: 4: Min assist Dynamic Sitting Balance Dynamic Sitting - Level of Assistance: 2: Max assist Static Standing Balance Static Standing - Level of Assistance: 1: +2 Total assist Dynamic Standing Balance Dynamic Standing - Level of Assistance: Not tested (comment) Extremity/Trunk Assessment RUE Assessment RUE Assessment: Within Functional Limits LUE Assessment LUE Assessment: Exceptions to WFL LUE Tone LUE Tone: Hypertonic;Moderate Hypertonic Details: pt able to reach arm overhead, but can not  extend elbow or fingers actively due to strong flexor tone  See FIM for current functional status Refer to Care Plan for Long Term Goals  Recommendations for other services: None  Discharge Criteria: Patient will be discharged from OT if patient refuses treatment 3 consecutive times without medical reason, if treatment goals not met, if there is a change in medical status, if patient makes no progress towards goals or if patient is discharged from hospital.  The above assessment, treatment plan, treatment alternatives and goals were discussed and mutually agreed upon: No family available/patient unable  [facility] 12/29/2011,  11:00 AM

## 2011-12-29 NOTE — Plan of Care (Addendum)
Overall Plan of Care [facility]) Patient Details Name: Brendan Scott MRN: [id number] DOB: [birth year]  Diagnosis:   Rehabilitation for CVA Primary Diagnosis:    CVA (cerebral infarction) Co-morbidities: Seizure disorder, Left neglect,knee OA,DM  Functional Problem List  Patient demonstrates impairments in the following areas: Balance, Bladder, Cognition, Endurance, Motor, Perception, Safety and Sensory   Basic ADL's: eating, grooming, bathing, dressing and toileting Advanced ADL's: not applicable at this time  Transfers:  bed mobility, bed to chair, toilet, tub/shower and car Locomotion:  ambulation and stairs  Additional Impairments:  Functional use of upper extremity, Swallowing, Communication  expression and Social Cognition   social interaction, problem solving, memory, attention and awareness  Anticipated Outcomes Item Anticipated Outcome  Eating/Swallowing  Min A  Basic self-care  Min assist  Tolieting  Mod assist  Bowel/Bladder  Min assist  Transfers  supervision  Locomotion  Supervision-min A  Communication  Supervision  Cognition  Min A  Pain  Less than or equal to 3  Safety/Judgment  Min A  Other     Therapy Plan: PT Frequency: 1-2 X/day, 60-90 minutes;5 out of 7 days OT Frequency: 1-2 X/day, 60-90 minutes;5 out of 7 days SLP Frequency: 1-2 X/day, 30-60 minutes   Team Interventions: Item RN PT OT SLP SW TR Other  Self Care/Advanced ADL Retraining   x      Neuromuscular Re-Education  x x      Therapeutic Activities  x x x  x   UE/LE Strength Training/ROM  x x   x   UE/LE Coordination Activities  x x   x   Visual/Perceptual Remediation/Compensation  x x   x   DME/Adaptive Equipment Instruction  x x   x   Therapeutic Exercise  x x      Balance/Vestibular Training  x x   x   Patient/Family Education x x x x  x   Cognitive Remediation/Compensation  x x x  x   Functional Mobility Training  x x   x   Ambulation/Gait Training  x       Chemical engineer Reintegration      x   Dysphagia/Aspiration Precaution Training    x     Speech/Language Facilitation    x     Bladder Management x        Bowel Management         Disease Management/Prevention         Pain Management         Medication Management x        Skin Care/Wound Management         Splinting/Orthotics  x       Discharge Planning  x x x  x   Psychosocial Support x   x  x                      Team Discharge Planning: Destination:  Home Projected Follow-up:  PT, OT, SLP and Home Health Projected Equipment Needs:  Need for w/c TBD as patient progresses Patient/family involved in discharge planning:  No family available  MD ELOS: 3 wks Medical Rehab Prognosis:  Good Assessment: 66 yo male with R MCA infarct now requiring 24/7 Rehab RN/MD, CIR level PT/OT/SLP

## 2011-12-29 NOTE — Evaluation (Signed)
Physical Therapy Assessment and Plan  Patient Details  Name: Brendan Scott MRN: [id number] Date of Birth: Apr 28, 1945  PT Diagnosis: Abnormality of gait, Cognitive deficits, Difficulty walking, Hemiplegia non-dominant and Muscle weakness Rehab Potential: Good ELOS: 3-4 weeks   Today's Date: 12/29/2011 Time: 9629-5284 Time Calculation (min): 60 min  Problem List:  Patient Active Problem List  Diagnosis  . DIABETES  . OSTEOARTHRITIS, LOWER LEG  . OSTEOARTHRITIS, KNEE, LEFT  . JOINT EFFUSION, KNEE  . Pain in Joint, Lower Leg  . SCIATICA, RIGHT  . TOTAL KNEE FOLLOW-UP  . Pain, knee  . S/P total knee replacement  . Diabetes mellitus  . Focal motor seizure  . Embolic stroke involving middle cerebral artery  . Aspiration into respiratory tract  . Respiratory distress  . CVA (cerebral infarction)    Past Medical History:  Past Medical History  Diagnosis Date  . Arthritis   . Diabetes mellitus   . Atrial fibrillation    Past Surgical History:  Past Surgical History  Procedure Date  . Total knee arthroplasty     Bilateral    Assessment & Plan Clinical Impression: Patient is a 66 y.o. right-handed male with history of atrial fibrillation on aspirin therapy prior to admission as well as diabetes mellitus. Admitted 12/15/2011 after being found in his driveway by family unresponsive. EMS arrived and noted patient to be slightly confused with left facial droop and dysarthria. MRI of the brain showed a large right middle cerebral artery distribution nonhemorrhagic infarct as well as incidental findings of large left sphenoid wing mass consistent with meningioma. He was not considered a candidate for intravenous thrombolytic therapy with TPA. MRA of the head showed high-grade stenosis proximal basilar artery. Echocardiogram with ejection fraction of 60% and no wall motion abnormalities without emboli. Cerebral angiogram showed no occlusions or stenosis. There was reported seizure  12/16/2011 and patient has been loaded with Keppra /Dilantin as well as vimpat. EEG did not show definite epileptiform discharges. Throughout his hospital course there as been recurrent seizures and has since resolved as of 12/23/2011 Neurology services consulted presently maintained on aspirin therapy as well as subcutaneous Lovenox for DVT prophylaxis. On 11/25 the EKG consistent with atrial fibrillation with cardiac enzymes negative. Coumadin was initiated 12/25/2011 and aspirin was discontinued.  Patient transferred to CIR on 12/27/2011 .   Patient currently requires max with mobility secondary to muscle weakness and muscle joint tightness, decreased cardiorespiratoy endurance, impaired timing and sequencing, abnormal tone, motor apraxia, decreased coordination and decreased motor planning, impaired attention to L visual field, decreased attention to left, decreased initiation, decreased attention, decreased awareness, decreased problem solving, decreased safety awareness, delayed processing and motor perseveration and decreased sitting balance, decreased standing balance, decreased postural control, hemiplegia and decreased balance strategies.  Prior to hospitalization, patient was independent with mobility and lived with Spouse in a Mobile home home.  Home access is 4Stairs to enter.  Patient will benefit from skilled PT intervention to maximize safe functional mobility, minimize fall risk and decrease caregiver burden for planned discharge home and recommending 24/7 supervision and assistance.  Anticipate patient will benefit from follow up HH at discharge.  PT - End of Session Activity Tolerance: Decreased this session;Tolerates 10 - 20 min activity with multiple rests Endurance Deficit: Yes Endurance Deficit Description: became lethargic and decreased ability to follow cues at end of session PT Assessment Rehab Potential: Good Barriers to Discharge: Decreased caregiver support PT Plan PT  Frequency: 1-2 X/day, 60-90 minutes;5 out of 7  days Estimated Length of Stay: 3-4 weeks PT Treatment/Interventions: Ambulation/gait training;Balance/vestibular training;Cognitive remediation/compensation;Discharge planning;DME/adaptive equipment instruction;Functional mobility training;Neuromuscular re-education;Pain management;Patient/family education;Splinting/orthotics;Stair training;Therapeutic Activities;Therapeutic Exercise;UE/LE Strength taining/ROM;UE/LE Coordination activities;Visual/perceptual remediation/compensation PT Recommendation Follow Up Recommendations: Home health PT;24 hour supervision/assistance Equipment Details: Patient has RW; w/c need TBD  PT Evaluation Precautions/Restrictions Precautions Precautions: Fall Precaution Comments: Very limited L knee flexion ROM Restrictions Weight Bearing Restrictions: No General @[id number] [phone]) Vital Signs Therapy Vitals Pulse Rate: 101  BP: 159/89 mmHg Patient Position, if appropriate: Sitting Oxygen Therapy SpO2: 97 % O2 Device: None (Room air) Pulse Oximetry Type: Intermittent Pain Pain Assessment Pain Assessment: No/denies pain Home Living/Prior Functioning Home Living Lives With: Spouse Available Help at Discharge: Family;Available PRN/intermittently (wife works, unknown if someone can be with patient daily) Type of Home: Mobile home Home Access: Stairs to enter [employer] of Steps: 4 Entrance Stairs-Rails: Can reach both;Left;Right Home Layout: One level Bathroom Shower/Tub: Walk-in shower;Door Home Adaptive Equipment: Walker - rolling Prior Function Level of Independence: Independent with gait;Independent with transfers Able to Take Stairs?: Yes Driving: Yes Vocation: Full time employment 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 Vision/Perception  Vision - History Baseline Vision: Wears glasses only for reading Patient Visual Report: No change from baseline Vision -  Assessment Vision Assessment: Vision tested Tracking/Visual Pursuits: Decreased smoothness of horizontal tracking;Decreased smoothness of vertical tracking Saccades: Designer, fashion/clothing Fields: Impaired - to be further tested in functional context Additional Comments: Appears to have increased difficulty scanning and attending to objects in L visual field but difficult to assess secondary to apraxia Perception Perception: Impaired Inattention/Neglect: Does not attend to left visual field;Does not attend to left side of body Praxis Praxis: Impaired Praxis Impairment Details: Motor planning;Initiation;Perseveration Praxis-Other Comments: Patient presents with delayed process and initiation when cued; impaired motor planning during functional mobility and severe perseveration during functional mobility tasks  Cognition Overall Cognitive Status: Impaired Arousal/Alertness: Lethargic Orientation Level: Oriented to person;Oriented to place;Oriented to situation Attention: Sustained Sustained Attention: Impaired Sustained Attention Impairment: Functional basic Awareness: Impaired Awareness Impairment: Intellectual impairment Problem Solving: Impaired Problem Solving Impairment: Functional basic Executive Function: Sequencing;Initiating;Self Monitoring Sequencing: Impaired Sequencing Impairment: Functional basic;Functional complex Initiating: Impaired Initiating Impairment: Functional basic Self Monitoring: Impaired Self Monitoring Impairment: Functional basic Behaviors: Perseveration Safety/Judgment: Impaired Sensation Sensation Light Touch: Appears Intact Stereognosis: Not tested Hot/Cold: Not tested Additional Comments: unable to fully assess; at times patient's LUE would fall off wheelchair and patient would not correct hand position; possibly from inattention Coordination Gross Motor Movements are Fluid and Coordinated: No Fine Motor Movements are Fluid and Coordinated:  No Coordination and Movement Description: severe apraxia and LUE/LLE hypertone Motor  Motor Motor: Hemiplegia;Motor apraxia;Motor perseverations;Abnormal tone Motor - Skilled Clinical Observations: L hemiplegia, ? tone in LLE vs. premorbid decreased ROM knee and ankle, motor apraxia and perseveration during functional mobility  Mobility Bed Mobility Bed Mobility: Sit to Supine;Supine to Sit Supine to Sit: 4: Min assist Sit to Supine: 4: Min assist Sit to Supine - Details (indicate cue type and reason): Sit <> supine on flat mat with verbal cues for initiation and min A to bring LLE fully onto mat and for repositioning once in supine, verbal cues to initiate supine > sit and min A to stabilize once sitting edge of mat. Transfers Stand Pivot Transfers: 2: Max Actuary Details (indicate cue type and reason): Max A to stand from w/c or mat with lifting assistance and assistance to bring COG over BOS secondary to limited L knee ROM with verbal and tactile  cues for initiation and sequencing and extra time needed for patient to process cues Locomotion  Ambulation Ambulation/Gait Assistance: 3: Mod assist Ambulation Distance (Feet): 50 Feet (x 2) Assistive device: 1 person hand held assist Ambulation/Gait Assistance Details: Patient initially requiring verbal cues for stepping sequence; mod HHA during gait secondary to tendency to lean, LOB to L. Gait Gait Pattern: Decreased step length - left;Decreased stance time - left;Decreased hip/knee flexion - left;Decreased weight shift to left;Step-to pattern;Lateral trunk lean to left;Lateral trunk lean to right;Narrow base of support Stairs / Additional Locomotion Stairs: Yes Stairs Assistance: 3: Mod assist Stairs Assistance Details (indicate cue type and reason): Required cues to attend to LUE positionon on rail and for initiation and safety; when patient's LUE slipped off of rail patient unable to attend or self correct LUE position  and required max A and total verbal cues to stop and for safety.  Alternating pattern to ascend, step to pattern to descend.   Stair Management Technique: Two rails;Alternating pattern;Forwards;Step to pattern Number of Stairs: 5  Height of Stairs: 6  Wheelchair Mobility Wheelchair Mobility: No Distance: 150  Trunk/Postural Assessment  Cervical Assessment Cervical Assessment: Within Functional Limits Thoracic Assessment Thoracic Assessment: Within Functional Limits Lumbar Assessment Lumbar Assessment: Within Functional Limits Postural Control Postural Control: Deficits on evaluation Trunk Control: In sitting and standing presents with L lateral lean with decreased ability to self monitor or correct  Balance Static Sitting Balance Static Sitting - Balance Support: Feet supported;Right upper extremity supported Static Sitting - Level of Assistance: 5: Stand by assistance Dynamic Sitting Balance Dynamic Sitting - Balance Support: Bilateral upper extremity supported;Feet supported Dynamic Sitting - Level of Assistance: 3: Mod assist Static Standing Balance Static Standing - Balance Support: Right upper extremity supported Static Standing - Level of Assistance: 3: Mod assist Dynamic Standing Balance Dynamic Standing - Balance Support: Right upper extremity supported Dynamic Standing - Level of Assistance: 3: Mod assist Extremity Assessment  RUE Assessment RUE Assessment: Within Functional Limits LUE Assessment LUE Assessment: Exceptions to WFL LUE Tone LUE Tone: Hypertonic;Moderate Hypertonic Details: pt able to reach arm overhead, but can not extend elbow or fingers actively due to strong flexor tone RLE Assessment RLE Assessment: Within Functional Limits RLE Strength RLE Overall Strength: Within Functional Limits for tasks assessed LLE Assessment LLE Assessment: Exceptions to [facility] LLE Strength LLE Overall Strength: Deficits;Due to pain;Due to premorbid status;Due to impaired  cognition LLE Overall Strength Comments: 4/5 overall except knee flexion limited by ? tone vs. premorbid knee extension contracture/OA pain  Therapy treatment: Performed Nustep at level 5 x 8 minutes with bilat UE and LE (ace wrap to assist with maintaining LUE placement on handle) with focus on increasing LLE ankle and knee ROM for transfers and gait.  Patient with severe perseveration on performing Nustep and when cued to cease Nustep and remove feet from foot plates patient unable to respond to cues; when feet removed by therapist patient replaced feet on machine.  Required +2 total A to cease Nustep and transfer to w/c secondary to inability to follow cues/commands.  See FIM for current functional status Refer to Care Plan for Long Term Goals  Recommendations for other services: None  Discharge Criteria: Patient will be discharged from PT if patient refuses treatment 3 consecutive times without medical reason, if treatment goals not met, if there is a change in medical status, if patient makes no progress towards goals or if patient is discharged from hospital.  The above assessment, treatment plan, treatment  alternatives and goals were discussed and mutually agreed upon: by patient  [place] [facility] 12/29/2011, 12:10 PM

## 2011-12-29 NOTE — Progress Notes (Signed)
ANTICOAGULATION CONSULT NOTE - Follow Up Consult  Pharmacy Consult for Coumadin Indication: atrial fibrillation  No Known Allergies  Patient Measurements:   Ht: 66", Wt: 95.5 Kg  Vital Signs: Temp: 97.7 F (36.5 C) (11/29 0620) Temp src: Oral (11/29 0620) BP: 140/85 mmHg (11/29 0620) Pulse Rate: 70  (11/29 0620)  Labs:  Basename 12/29/11 0555 12/28/11 0649 12/27/11 0600  HGB 12.6* -- --  HCT 40.4 -- --  PLT 341 -- --  APTT -- -- --  LABPROT 26.0* 30.1* 20.0*  INR 2.52* 3.07* 1.77*  HEPARINUNFRC -- -- --  CREATININE 1.09 -- --  CKTOTAL -- -- --  CKMB -- -- --  TROPONINI -- -- --    The CrCl is unknown because both a height and weight (above a minimum accepted value) are required for this calculation.   Assessment: Brendan Scott is a 66 y/o male patient with a history of afib who was on ASA 81 mg PTA admitted with stroke, presenting outside of window for TPA.   Patient with suspected meningioma on CT scan. Coumadin per pharmacy started 11/25 and has received 3 doses of 7.5mg  which resulted in a supratherapeutic INR following abrupt increase, noted drug interaction with dilantin (may increase INR). INR today is therapeutic at 2.52 after held dose last night. CBC stable, no bleeding noted.   Goal of Therapy:  INR 2-3   Plan:  1. Coumadin 5mg  PO x 1 tonight 2. F/u AM INR  Lysle Pearl, PharmD, BCPS Pager # [phone] 12/29/2011 9:38 AM

## 2011-12-29 NOTE — Care Management Note (Signed)
[facility] Individual Statement of Services  Patient Name:  Brendan Scott  Date:  12/29/2011  Welcome to the [facility].  Our goal is to provide you with an individualized program based on your diagnosis and situation, designed to meet your specific needs.  With this comprehensive rehabilitation program, you will be expected to participate in at least 3 hours of rehabilitation therapies Monday-Friday, with modified therapy programming on the weekends.  Your rehabilitation program will include the following services:  Physical Therapy (PT), Occupational Therapy (OT), Speech Therapy (ST), 24 hour per day rehabilitation nursing, Therapeutic Recreaction (TR), Neuropsychology, Case Management (RN and Social Worker), Rehabilitation Medicine, Nutrition Services and Pharmacy Services  Weekly team conferences will be held on Wednesday to discuss your progress.  Your RN Case Designer, television/film set will talk with you frequently to get your input and to update you on team discussions.  Team conferences with you and your family in attendance may also be held.  Expected length of stay: 3-4 weeks  Overall anticipated outcome: min level  Depending on your progress and recovery, your program may change.  Your RN Case Estate agent will coordinate services and will keep you informed of any changes.  Your RN Sports coach and SW names and contact numbers are listed  below.  The following services may also be recommended but are not provided by the [facility]:   Driving Evaluations  Home Health Rehabiltiation Services  Outpatient Rehabilitatation [facility]  Vocational Rehabilitation   Arrangements will be made to provide these services after discharge if needed.  Arrangements include referral to agencies that provide these services.  Your insurance has been verified to be:  [employer] Your primary doctor is:   NONE  Pertinent information will be shared with your doctor and your insurance company.   Social Worker:  Dossie Der, [place] [phone]  Information discussed with and copy given to patient by: Lucy Chris, 12/29/2011, 1:15 PM

## 2011-12-29 NOTE — Progress Notes (Signed)
Social Work Patient ID: Brendan Scott, male   DOB: [birth year], 66 y.o.   MRN: [id number] Left message for pt's wife and will await her return call.  Pt is unable to assist with the psycho-social assessment. Spoke with his brother when he was here this am.  Await wife's call to complete assessment.

## 2011-12-29 NOTE — Progress Notes (Signed)
Occupational Therapy Session Note  Patient Details  Name: Brendan Scott MRN: [id number] Date of Birth: September 27, 1945  Today's Date: 12/29/2011 Time: 1300-1330 Time Calculation (min): 30 min  Short Term Goals: Week 1:  OT Short Term Goal 1 (Week 1): Pt will be able to sit on EOB with supervision. OT Short Term Goal 2 (Week 1): Pt will demonstrate increased LUE motor control by bathing right arm with min assist. OT Short Term Goal 3 (Week 1): Pt will demonstrate improved motor planning skills by donning shirt with mod assist. OT Short Term Goal 4 (Week 1): Pt will transfer squat pivot to w/c and/or toilet with max assist x 1.   Skilled Therapeutic Interventions/Progress Updates:  Balance/vestibular training;Cognitive remediation/compensation;Discharge planning;DME/adaptive equipment instruction;Functional mobility training;Patient/family education;Neuromuscular re-education;Self Care/advanced ADL retraining;Therapeutic Activities;UE/LE Strength taining/ROM;Therapeutic Exercise;UE/LE Coordination activities;Visual/perceptual remediation/compensation   Pt seen for 1:1 OT with focus on NM re-ed in sitting with mod-max cues to attend to LUE and incorporate LUE into tasks.  Engaged in ball toss with medium size therapy ball with forced use of LUE, pt required assistance to open hand/extend fingers.  Attempted to complete matching activity with use of both RUE and LUE with pt demonstrating fixation on activity and wanting to complete in same manner, despite this therapist changing directions midway through activity.  Pt able to hold items in Lt pincer grasp with use of Rt hand to place it there, unable to grasp without assistance.  Therapy Documentation Precautions:  Precautions Precautions: Fall Precaution Comments: Very limited L knee flexion ROM Restrictions Weight Bearing Restrictions: No General:   Vital Signs: Therapy Vitals Pulse Rate: 101  BP: 159/89 mmHg Patient Position, if  appropriate: Sitting Oxygen Therapy SpO2: 97 % O2 Device: None (Room air) Pulse Oximetry Type: Intermittent Pain: Pain Assessment Pain Assessment: No/denies pain  See FIM for current functional status  Therapy/Group: Individual Therapy  Leonette Monarch 12/29/2011, 2:46 PM

## 2011-12-29 NOTE — Progress Notes (Signed)
Patient information reviewed and entered into eRehab system by Zaveon Gillen, RN, CRRN, PPS Coordinator.  Information including medical coding and functional independence measure will be reviewed and updated through discharge.     Per nursing patient was given "Data Collection Information Summary for Patients in Inpatient Rehabilitation Facilities with attached "Privacy Act Statement-Health Care Records" upon admission.  

## 2011-12-29 NOTE — Progress Notes (Signed)
Patient ID: Brendan Scott, male   DOB: 03-Apr-1945, 66 y.o.   MRN: [id number] Subjective/Complaints:  66 y.o. right-handed male with history of atrial fibrillation on aspirin therapy prior to admission as well as diabetes mellitus. Admitted 12/15/2011 after being found in his driveway by family unresponsive. EMS arrived and noted patient to be slightly confused with left facial droop and dysarthria. MRI of the brain showed a large right middle cerebral artery distribution nonhemorrhagic infarct as well as incidental findings of large left sphenoid wing mass consistent with meningioma. He was not considered a candidate for intravenous thrombolytic therapy with TPA. MRA of the head showed high-grade stenosis proximal basilar artery. Echocardiogram with ejection fraction of 60% and no wall motion abnormalities without emboli. Cerebral angiogram showed no occlusions or stenosis. There was reported seizure 12/16/2011 and patient has been loaded with Keppra /Dilantin as well as vimpat. EEG did not show definite epileptiform discharges. Throughout his hospital course there as been recurrent seizures and has since resolved as of 12/23/2011 Neurology services consulted presently maintained on aspirin therapy as well as subcutaneous Lovenox for DVT prophylaxis. On 11/25 the EKG consistent with atrial fibrillation with cardiac enzymes negative. Coumadin was initiated 12/25/2011 and aspirin was discontinued. He would remain on Lovenox for bridging until INR greater than 2.00. Speech therapy followup for dysphagia and diet has slowly been advanced to a dysphagia 1 pudding thick liquid diet with latest swallow study 12/25/2011. He continued with IV fluids for hydration secondary to pudding thick liquids.  Objective: Vital Signs: Blood pressure 140/85, pulse 70, temperature 97.7 F (36.5 C), temperature source Oral, resp. rate 19, SpO2 99.00%. No results found. Results for orders placed during the hospital encounter of  12/27/11 (from the past 72 hour(s))  GLUCOSE, CAPILLARY     Status: Abnormal   Collection Time   12/27/11  8:48 PM      Component Value Range Comment   Glucose-Capillary 171 (*) 70 - 99 mg/dL    Comment 1 Notify RN     PROTIME-INR     Status: Abnormal   Collection Time   12/28/11  6:49 AM      Component Value Range Comment   Prothrombin Time 30.1 (*) 11.6 - 15.2 seconds    INR 3.07 (*) 0.00 - 1.49   GLUCOSE, CAPILLARY     Status: Abnormal   Collection Time   12/28/11  7:28 AM      Component Value Range Comment   Glucose-Capillary 140 (*) 70 - 99 mg/dL    Comment 1 Notify RN     GLUCOSE, CAPILLARY     Status: Abnormal   Collection Time   12/28/11 11:27 AM      Component Value Range Comment   Glucose-Capillary 239 (*) 70 - 99 mg/dL    Comment 1 Notify RN     GLUCOSE, CAPILLARY     Status: Abnormal   Collection Time   12/28/11  4:46 PM      Component Value Range Comment   Glucose-Capillary 154 (*) 70 - 99 mg/dL   GLUCOSE, CAPILLARY     Status: Abnormal   Collection Time   12/28/11  9:11 PM      Component Value Range Comment   Glucose-Capillary 119 (*) 70 - 99 mg/dL   CBC WITH DIFFERENTIAL     Status: Abnormal   Collection Time   12/29/11  5:55 AM      Component Value Range Comment   WBC 7.6  4.0 - 10.5 K/uL  RBC 4.96  4.22 - 5.81 MIL/uL    Hemoglobin 12.6 (*) 13.0 - 17.0 g/dL    HCT 16.1  09.6 - 04.5 %    MCV 81.5  78.0 - 100.0 fL    MCH 25.4 (*) 26.0 - 34.0 pg    MCHC 31.2  30.0 - 36.0 g/dL    RDW 40.9  81.1 - 91.4 %    Platelets 341  150 - 400 K/uL    Neutrophils Relative 71  43 - 77 %    Neutro Abs 5.4  1.7 - 7.7 K/uL    Lymphocytes Relative 14  12 - 46 %    Lymphs Abs 1.1  0.7 - 4.0 K/uL    Monocytes Relative 13 (*) 3 - 12 %    Monocytes Absolute 1.0  0.1 - 1.0 K/uL    Eosinophils Relative 2  0 - 5 %    Eosinophils Absolute 0.2  0.0 - 0.7 K/uL    Basophils Relative 0  0 - 1 %    Basophils Absolute 0.0  0.0 - 0.1 K/uL   COMPREHENSIVE METABOLIC PANEL      Status: Abnormal   Collection Time   12/29/11  5:55 AM      Component Value Range Comment   Sodium 137  135 - 145 mEq/L    Potassium 3.7  3.5 - 5.1 mEq/L    Chloride 103  96 - 112 mEq/L    CO2 26  19 - 32 mEq/L    Glucose, Bld 151 (*) 70 - 99 mg/dL    BUN 14  6 - 23 mg/dL    Creatinine, Ser 7.82  0.50 - 1.35 mg/dL    Calcium 9.2  8.4 - 95.6 mg/dL    Total Protein 6.5  6.0 - 8.3 g/dL    Albumin 2.7 (*) 3.5 - 5.2 g/dL    AST 42 (*) 0 - 37 U/L    ALT 75 (*) 0 - 53 U/L    Alkaline Phosphatase 107  39 - 117 U/L    Total Bilirubin 0.2 (*) 0.3 - 1.2 mg/dL    GFR calc non Af Amer 69 (*) >90 mL/min    GFR calc Af Amer 80 (*) >90 mL/min   PROTIME-INR     Status: Abnormal   Collection Time   12/29/11  5:55 AM      Component Value Range Comment   Prothrombin Time 26.0 (*) 11.6 - 15.2 seconds    INR 2.52 (*) 0.00 - 1.49   GLUCOSE, CAPILLARY     Status: Abnormal   Collection Time   12/29/11  7:56 AM      Component Value Range Comment   Glucose-Capillary 136 (*) 70 - 99 mg/dL      HEENT: L lower facial droop Cardio: RRR Resp: Wheezes and Clear with cough GI: BS positive Extremity:  Pulses positive and No Edema Skin:   Intact Neuro: Lethargic, Cranial Nerve Abnormalities L central VII, Normal Sensory, Abnormal Motor 0/5 L hand , 3-/5 Bi.tri,delt,HF,KE,ankle DF/PF, Dysarthric, Inattention and Other Left neglect no field cut Musc/Skel:  Normal and Other no shoulder pain no ataxia   Assessment/Plan: 1. Functional deficits secondary to R MCA infarct with L HP which require 3+ hours per day of interdisciplinary therapy in a comprehensive inpatient rehab setting. Physiatrist is providing close team supervision and 24 hour management of active medical problems listed below. Physiatrist and rehab team continue to assess barriers to discharge/monitor patient progress toward functional and  medical goals. FIM:                   Comprehension Comprehension Mode:  Auditory Comprehension: 4-Understands basic 75 - 89% of the time/requires cueing 10 - 24% of the time  Expression Expression Mode: Verbal Expression: 4-Expresses basic 75 - 89% of the time/requires cueing 10 - 24% of the time. Needs helper to occlude trach/needs to repeat words.  Social Interaction Social Interaction: 4-Interacts appropriately 75 - 89% of the time - Needs redirection for appropriate language or to initiate interaction.  Problem Solving Problem Solving: 2-Solves basic 25 - 49% of the time - needs direction more than half the time to initiate, plan or complete simple activities  Memory Memory: 2-Recognizes or recalls 25 - 49% of the time/requires cueing 51 - 75% of the time  Medical Problem List and Plan:  1. 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 DVT Prophylaxis/Anticoagulation: Chronic Coumadin therapy with INR 1.77 12/27/2011. Continue Lovenox 40 mg daily  Until INR 2.00. Monitor platelet counts any signs of bleeding  3. Neuropsych: This patient is not capable of making decisions on his/her own behalf.  4. Recurrent seizures. EEG was negative. Continue Klonopin 0.5 mg each bedtime, vimpat 300mg  bid,keppra 2500mg  Q12hour, Dilantin 100 mg 3 times a day. Monitor closely for a seizure activity  5. Hypertension/atrial fibrillation of new onset. Lisinopril 5 mg daily. Cardiac rate is controlled. There's been no reports of chest pain or shortness of breath  6. Proteus UTI. Intravenous Rocephin 1 g daily x7 days initiated 12/25/2011, change to po keflex  7. Dysphagia. Dysphagia 1 pudding thick liquids. Continue intravenous fluids for hydration  8. Non-insulin-dependent diabetes mellitus: Hemoglobin A1c 6.9.. Patient currently with sliding scale insulin. Patient was taking Glucophage 500 mg daily prior to admission. Sugars controlled suboptimally at present.  -resume gluchopage at 500mg  qday    LOS (Days) 2 A FACE TO FACE EVALUATION WAS  PERFORMED  Joye Wesenberg E 12/29/2011, 8:19 AM

## 2011-12-30 ENCOUNTER — Inpatient Hospital Stay (HOSPITAL_COMMUNITY): Payer: BC Managed Care – PPO | Admitting: Occupational Therapy

## 2011-12-30 ENCOUNTER — Inpatient Hospital Stay (HOSPITAL_COMMUNITY): Payer: BC Managed Care – PPO | Admitting: Physical Therapy

## 2011-12-30 ENCOUNTER — Inpatient Hospital Stay (HOSPITAL_COMMUNITY): Payer: BC Managed Care – PPO | Admitting: Speech Pathology

## 2011-12-30 LAB — GLUCOSE, CAPILLARY
Glucose-Capillary: 151 mg/dL — ABNORMAL HIGH (ref 70–99)
Glucose-Capillary: 163 mg/dL — ABNORMAL HIGH (ref 70–99)
Glucose-Capillary: 112 mg/dL — ABNORMAL HIGH (ref 70–99)

## 2011-12-30 MED ORDER — WARFARIN SODIUM 5 MG PO TABS
5.0000 mg | ORAL_TABLET | Freq: Once | ORAL | Status: AC
  Administered 2011-12-30: 5 mg via ORAL
  Filled 2011-12-30 (×2): qty 1

## 2011-12-30 NOTE — Progress Notes (Signed)
Physical Therapy Session Note  Patient Details  Name: Brendan Scott MRN: [id number] Date of Birth: 12-05-1945  Today's Date: 12/30/2011 Time: 1500-1530 Time Calculation (min): 30 min  Short Term Goals: Week 1:  PT Short Term Goal 1 (Week 1): Patient will perform bed mobility on flat bed with min A and 50% cues for initiation and sequencing PT Short Term Goal 2 (Week 1): Patient will perform bed <> w/c transfers with min A and 50% cues for initiation and sequencing PT Short Term Goal 3 (Week 1): Patient will perform gait in controlled environment with LRAD x 150' with mod A and 50% cues for safety, sequencing and attention to L environment PT Short Term Goal 4 (Week 1): Patient will perform up and down 4-6 stairs with bilat rails and min A with 50% cues for safety, sequencing  Therapy Documentation Precautions:  Precautions Precautions: Fall Precaution Comments: Very limited L knee flexion ROM Restrictions Weight Bearing Restrictions: No Vital Signs: Therapy Vitals Temp: 98.4 F (36.9 C) Temp src: Oral Resp: 20  BP: 139/81 mmHg Patient Position, if appropriate: Sitting Oxygen Therapy SpO2: 96 % O2 Device: None (Room air) Pain: Pain Assessment Pain Assessment: No/denies pain  Gait Training:(15') using hemiwalker in Right hand Mod-A x 2 to stand but then CGA with gait x 130'.   1 x 15' inside room using hemiwalker with CGA. Therapeutic Exercise:(15') PROM Left knee flexion with manual therapy/deep tissue massage of quads and patellar mobs Gr III into medial and inferior glide to encourage left knee flexion.  ROM flexion to 90 degrees.  See FIM for current functional status  Therapy/Group: Individual Therapy  Lynae Pederson J 12/30/2011, 3:06 PM
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
## 2011-12-30 NOTE — Progress Notes (Signed)
Occupational Therapy Session Note  Patient Details  Name: Brendan Scott MRN: [id number] Date of Birth: 03-Jun-1945  Today's Date: 12/30/2011 Time: 0900-1000 Time Calculation (min): 60 min  Short Term Goals: Week 1:  OT Short Term Goal 1 (Week 1): Pt will be able to sit on EOB with supervision. OT Short Term Goal 2 (Week 1): Pt will demonstrate increased LUE motor control by bathing right arm with min assist. OT Short Term Goal 3 (Week 1): Pt will demonstrate improved motor planning skills by donning shirt with mod assist. OT Short Term Goal 4 (Week 1): Pt will transfer squat pivot to w/c and/or toilet with max assist x 1.   Skilled Therapeutic Interventions/Progress Updates:    Pt seen for ADL retraining with focus on Lt attention, motor planning, bed mobility, sitting balance, and transfers.  Pt completed majority of bathing at bed level with focus on rolling and attention to Lt arm for positioning and to incorporate it in self-care tasks.  Pt demonstrated increased attention to task this session and ability to follow simple directions.  +2 for standing with pulling up pants and with stand pivot transfer for safety.  Pt demonstrated initiation with transfer and carryover of UE placement with reaching for w/c to assist with transfer.  Pt required verbal cues for cessation of grooming task of oral hygiene as pt perseverated on this task.    Therapy Documentation Precautions:  Precautions Precautions: Fall Precaution Comments: Very limited L knee flexion ROM Restrictions Weight Bearing Restrictions: No Pain:  Pt with no c/o pain this session.  See FIM for current functional status  Therapy/Group: Individual Therapy  Leonette Monarch 12/30/2011, 10:02 AM

## 2011-12-30 NOTE — Progress Notes (Signed)
Patient ID: Brendan Scott, male   DOB: 06-10-1945, 66 y.o.   MRN: [id number] Subjective/Complaints:  66 y.o. right-handed male with history of atrial fibrillation on aspirin therapy prior to admission as well as diabetes mellitus. Admitted 12/15/2011 after being found in his driveway by family unresponsive. EMS arrived and noted patient to be slightly confused with left facial droop and dysarthria. MRI of the brain showed a large right middle cerebral artery distribution nonhemorrhagic infarct as well as incidental findings of large left sphenoid wing mass consistent with meningioma. He was not considered a candidate for intravenous thrombolytic therapy with TPA. MRA of the head showed high-grade stenosis proximal basilar artery. Echocardiogram with ejection fraction of 60% and no wall motion abnormalities without emboli. Cerebral angiogram showed no occlusions or stenosis. There was reported seizure 12/16/2011 and patient has been loaded with Keppra /Dilantin as well as vimpat. EEG did not show definite epileptiform discharges. Throughout his hospital course there as been recurrent seizures and has since resolved as of 12/23/2011 Neurology services consulted presently maintained on aspirin therapy as well as subcutaneous Lovenox for DVT prophylaxis. On 11/25 the EKG consistent with atrial fibrillation with cardiac enzymes negative. Coumadin was initiated 12/25/2011 and aspirin was discontinued. He would remain on Lovenox for bridging until INR greater than 2.00. Speech therapy followup for dysphagia and diet has slowly been advanced to a dysphagia 1 pudding thick liquid diet with latest swallow study 12/25/2011. He continued with IV fluids for hydration secondary to pudding thick liquids.  Objective: Vital Signs: Blood pressure 131/78, pulse 73, temperature 98.2 F (36.8 C), temperature source Oral, resp. rate 18, SpO2 100.00%.  nad Chest cta cv- irreg rate abd- soft, + bs    Assessment/Plan: 1.  Functional deficits secondary to R MCA infarct with L HP  Medical Problem List and Plan:  1. 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 DVT Prophylaxis/Anticoagulation: Chronic Coumadin therapy with INR 1.77 12/27/2011. Continue Lovenox 40 mg daily  Until INR 2.00. Monitor platelet counts any signs of bleeding  3. Neuropsych: This patient is not capable of making decisions on his/her own behalf.  4. Recurrent seizures. EEG was negative. Continue Klonopin 0.5 mg each bedtime, vimpat 300mg  bid,keppra 2500mg  Q12hour, Dilantin 100 mg 3 times a day. Monitor closely for a seizure activity  5. Hypertension/atrial fibrillation of new onset. Lisinopril 5 mg daily. Cardiac rate is controlled.  6. Proteus UTI. Intravenous Rocephin 1 g daily x7 days initiated 12/25/2011, change to po keflex  7. Dysphagia. Dysphagia 1 pudding thick liquids. Continue intravenous fluids for hydration  8. Non-insulin-dependent diabetes mellitus: Hemoglobin A1c 6.9.. Patient currently with sliding scale insulin.  LOS (Days) 3 A FACE TO FACE EVALUATION WAS PERFORMED  SWORDS,BRUCE HENRY 12/30/2011, 6:00 AM

## 2011-12-30 NOTE — Progress Notes (Signed)
Speech Language Pathology Daily Session Note  Patient Details  Name: Brendan Scott MRN: [id number] Date of Birth: 11-03-1945  Today's Date: 12/30/2011 Time: 0800-0830 Time Calculation (min): 30 min  Short Term Goals: Week 1: SLP Short Term Goal 1 (Week 1): Pt will self-monitor and correct left anterior spillage of secretions with Mod verbal and question cues.  SLP Short Term Goal 2 (Week 1): Pt will utilize swallowing compensatory strategies of multiple swallows and small bites with current diet textures with Mod A verbal cues.  SLP Short Term Goal 3 (Week 1): Pt will demonstrate sustained attention to a functional task for 5 minutes with Mod A verbal and question cues for redirection SLP Short Term Goal 4 (Week 1): Pt will attend to left enviornment/body with Mod A tactile and verbal cues.  SLP Short Term Goal 5 (Week 1): Pt will maintain topic of conversation for ~3 turns with Max A verbal and question cues for redirection.  SLP Short Term Goal 6 (Week 1): Pt will identify 1 physical and 1 cognitive deficit with Mod A semantic cues.   Skilled Therapeutic Interventions: Treatment focus on dysphagia and cognitive goals. Pt consumed Dys. 1 textures with pudding thick liquids without overt s/s of aspiration and utilized swallowing compensatory strategies of small bites, multiple swallows and intermittent throat clear with Min verbal cues.  Pt continues to demonstrate decreased bolus manipulation/cohesion and delayed AP transit with puree textures with Moderate oral residue. Pt's wife present during session and educated on pt's current swallowing function and goals. Pt's wife requires continued education and cannot supervise pt with meals at this time. Pt initiated meal with supervision verbal cues and located tray items in left environment with Min verbal cues, however, pt required Max verbal and visual cues to attend to LUE and to maintain eye contact in left environment with clinician. Pt able to  attend to one task at a time and could not answer clinician's functional questions during a self-feeding task.    FIM:  Comprehension Comprehension Mode: Auditory Comprehension: 3-Understands basic 50 - 74% of the time/requires cueing 25 - 50%  of the time Expression Expression: 2-Expresses basic 25 - 49% of the time/requires cueing 50 - 75% of the time. Uses single words/gestures. Social Interaction Social Interaction: 2-Interacts appropriately 25 - 49% of time - Needs frequent redirection. Problem Solving Problem Solving: 2-Solves basic 25 - 49% of the time - needs direction more than half the time to initiate, plan or complete simple activities Memory Memory: 3-Recognizes or recalls 50 - 74% of the time/requires cueing 25 - 49% of the time FIM - Eating Eating Activity: 4: Helper checks for pocketed food  Pain Pain Assessment Pain Assessment: No/denies pain  Therapy/Group: Individual Therapy  Jia Dottavio 12/30/2011, 11:22 AM

## 2011-12-30 NOTE — Progress Notes (Signed)
ANTICOAGULATION CONSULT NOTE - Follow Up Consult  Pharmacy Consult for Coumadin Indication: atrial fibrillation  No Known Allergies  Patient Measurements:   Ht: 66", Wt: 95.5 Kg  Vital Signs: Temp: 98.2 F (36.8 C) (11/30 0550) Temp src: Oral (11/30 0550) BP: 131/78 mmHg (11/30 0550) Pulse Rate: 73  (11/30 0550)  Labs:  Basename 12/30/11 0630 12/29/11 0555 12/28/11 0649  HGB -- 12.6* --  HCT -- 40.4 --  PLT -- 341 --  APTT -- -- --  LABPROT 25.7* 26.0* 30.1*  INR 2.48* 2.52* 3.07*  HEPARINUNFRC -- -- --  CREATININE -- 1.09 --  CKTOTAL -- -- --  CKMB -- -- --  TROPONINI -- -- --    The CrCl is unknown because both a height and weight (above a minimum accepted value) are required for this calculation.   Assessment: Mr. Lesko is a 66 y/o male patient with a history of afib who was on ASA 81 mg PTA admitted with stroke, presenting outside of window for TPA.   Patient with suspected meningioma on CT scan. Coumadin per pharmacy started 11/25 and has received 3 doses of 7.5mg  which resulted in a supratherapeutic INR following abrupt increase, noted drug interaction with dilantin (may increase INR). INR today is therapeutic at 2.48 after held dose 11/28. CBC stable, no bleeding noted.   Goal of Therapy:  INR 2-3   Plan:  1. Repeat Coumadin 5mg  PO x 1 tonight 2. F/u AM INR  Lysle Pearl, PharmD, BCPS Pager # [phone] 12/30/2011 7:34 AM

## 2011-12-31 ENCOUNTER — Inpatient Hospital Stay (HOSPITAL_COMMUNITY): Payer: BC Managed Care – PPO | Admitting: *Deleted

## 2011-12-31 ENCOUNTER — Inpatient Hospital Stay (HOSPITAL_COMMUNITY): Payer: BC Managed Care – PPO | Admitting: Physical Therapy

## 2011-12-31 ENCOUNTER — Encounter (HOSPITAL_COMMUNITY): Payer: BC Managed Care – PPO | Admitting: Occupational Therapy

## 2011-12-31 LAB — PROTIME-INR: Prothrombin Time: 28.6 seconds — ABNORMAL HIGH (ref 11.6–15.2)

## 2011-12-31 LAB — GLUCOSE, CAPILLARY: Glucose-Capillary: 187 mg/dL — ABNORMAL HIGH (ref 70–99)

## 2011-12-31 MED ORDER — WARFARIN SODIUM 2.5 MG PO TABS
2.5000 mg | ORAL_TABLET | Freq: Once | ORAL | Status: AC
  Administered 2011-12-31: 2.5 mg via ORAL
  Filled 2011-12-31: qty 1

## 2011-12-31 NOTE — Progress Notes (Signed)
ANTICOAGULATION CONSULT NOTE - Follow Up Consult  Pharmacy Consult for Coumadin Indication: atrial fibrillation  No Known Allergies  Patient Measurements:   Ht: 66", Wt: 95.5 Kg  Vital Signs: Temp: 97.4 F (36.3 C) (12/01 0424) Temp src: Oral (12/01 0424) BP: 161/90 mmHg (12/01 0424) Pulse Rate: 73  (12/01 0424)  Labs:  Basename 12/31/11 0740 12/30/11 0630 12/29/11 0555  HGB -- -- 12.6*  HCT -- -- 40.4  PLT -- -- 341  APTT -- -- --  LABPROT 28.6* 25.7* 26.0*  INR 2.87* 2.48* 2.52*  HEPARINUNFRC -- -- --  CREATININE -- -- 1.09  CKTOTAL -- -- --  CKMB -- -- --  TROPONINI -- -- --    The CrCl is unknown because both a height and weight (above a minimum accepted value) are required for this calculation.   Assessment: Brendan Scott is a 66 y/o male patient with a history of afib who was on ASA 81 mg PTA admitted with stroke, presenting outside of window for TPA.   Patient with suspected meningioma on CT scan. Coumadin per pharmacy started 11/25 and has received 3 doses of 7.5mg  which resulted in a supratherapeutic INR following abrupt increase, noted drug interaction with dilantin (may increase INR). INR today is therapeutic at 2.87 but with a large increase form yesterday. CBC stable, no bleeding noted.   Goal of Therapy:  INR 2-3   Plan:  1. Coumadin 2.5mg  PO x 1 tonight 2. F/u AM INR 3. Consider checking a phenytoin level tomorrow and weekly  Lysle Pearl, PharmD, BCPS Pager # [phone] 12/31/2011 9:30 AM

## 2011-12-31 NOTE — Progress Notes (Signed)
Occupational Therapy Note  Patient Details  Name: Brendan Scott MRN: [id number] Date of Birth: 07-25-1945 Today's Date: 12/31/2011  1100-1200 (60 Min) Individual therapy Pain:  None  Engaged in therapeutic intervention for wc mobility, LUE neuromuscular education, transfers, functional ambulation, attending to left, body awareness.  Pt's wife, Kathie Rhodes, present during session.  Pt. Propelled wc with mod cues to keep straight and not hit the left wall or equipment.  Assisted LUE with moving wheel.  Pt. Ambulated in gym x3 with minimal assist with second person for safey (about 150 feet total).  Usee hemi walker then no assistive device.    Used LUE in grasp and release and reaching activities.  Facilitated Both arm swing when walking.  Wife very happy about pt's progress.    Humberto Seals 12/31/2011, 11:15 AM

## 2011-12-31 NOTE — Progress Notes (Signed)
Physical Therapy Note  Patient Details  Name: Brendan Scott MRN: [id number] Date of Birth: [birth year] Today's Date: 12/31/2011  1300-1355 (55 minutes) individual Pain: no reported pain Focus of treatment: transfer training; sit to stand training; Neuro re-ed LT LE; gait training Treatment: transfers - stand/pivot wc >< mat min/mod assist; sit to supine (mat) min assist; supine to side to sit mod assist with tactile cues for technique; passive knee flexion stretch in supine, passive hamstring stretch ; Neuro re-ed LT LE- bilateral hip flexion/extension using therapy ball in supine ; sitting edge of raised mat bilateral knee flexion/extension; sit to stand from mat with bedside table in front to facilitate forward lean SBA 3 X 10; standing placing horseshoes on basketball rim using both hands together to improve trunk extension in stance; gait 80 feet HW (right) min assist.; wc mobility 120 feet min assist with mod vcs to use RT extremities and maintain Lt knee flexed on footrest.    Nihira Puello,JIM 12/31/2011, 7:37 AM

## 2011-12-31 NOTE — Progress Notes (Signed)
Occupational Therapy Session Note  Patient Details  Name: Brendan Scott MRN: [id number] Date of Birth: 1945/10/23  Today's Date: 12/31/2011 Time: 7829-5621 Time Calculation (min): 54 min  Short Term Goals: Week 1:  OT Short Term Goal 1 (Week 1): Pt will be able to sit on EOB with supervision. OT Short Term Goal 2 (Week 1): Pt will demonstrate increased LUE motor control by bathing right arm with min assist. OT Short Term Goal 3 (Week 1): Pt will demonstrate improved motor planning skills by donning shirt with mod assist. OT Short Term Goal 4 (Week 1): Pt will transfer squat pivot to w/c and/or toilet with max assist x 1.   Skilled Therapeutic Interventions/Progress Updates:   1) Upon entering room today, pt was up in w/c with RN assistant eating breakfast, whom also states that pt had just been assisted in changing pull up/cleaning after voiding/BM prior to OT arrival. Pt was seen for individual OT treatment session for ADL retraining related to self feeding, grooming & bathing/dressing upper body at sink from w/c level with specific focus on Lt attention, motor planning & sitting balance. Pt completed upper body bathing at sink with focus on attention to Lt arm for positioning and for functional use/incorporation during self-care tasks. Pt was able to follow simple directions/commands in a quiet setting today. Pt performed self feeding tasks following frequent VC's and simple commands, while neuromuscular re-education was performed on LUE & pt was encouraged to use as gross assist & incorporate during functional activities.  Therapy Documentation Precautions:  Precautions Precautions: Fall Precaution Comments: Very limited L knee flexion ROM Restrictions Weight Bearing Restrictions: No     Pain: Pain Assessment Pain Assessment: No/denies pain Pain Score: 0-No pain       See FIM for current functional status  Therapy/Group: Individual Therapy  Alm Bustard 12/31/2011,  9:25 AM

## 2011-12-31 NOTE — Progress Notes (Signed)
Patient ID: Brendan Scott, male   DOB: [birth year], 66 y.o.   MRN: [id number] Subjective/Complaints:  66 y.o. right-handed male with history of atrial fibrillation on aspirin therapy prior to admission as well as diabetes mellitus. Admitted 12/15/2011 after being found in his driveway by family unresponsive. EMS arrived and noted patient to be slightly confused with left facial droop and dysarthria. MRI of the brain showed a large right middle cerebral artery distribution nonhemorrhagic infarct as well as incidental findings of large left sphenoid wing mass consistent with meningioma. He was not considered a candidate for intravenous thrombolytic therapy with TPA. MRA of the head showed high-grade stenosis proximal basilar artery. Echocardiogram with ejection fraction of 60% and no wall motion abnormalities without emboli. Cerebral angiogram showed no occlusions or stenosis. There was reported seizure 12/16/2011 and patient has been loaded with Keppra /Dilantin as well as vimpat. EEG did not show definite epileptiform discharges. Throughout his hospital course there as been recurrent seizures and has since resolved as of 12/23/2011 Neurology services consulted presently maintained on aspirin therapy as well as subcutaneous Lovenox for DVT prophylaxis. On 11/25 the EKG consistent with atrial fibrillation with cardiac enzymes negative. Coumadin was initiated 12/25/2011 and aspirin was discontinued. He would remain on Lovenox for bridging until INR greater than 2.00. Speech therapy followup for dysphagia and diet has slowly been advanced to a dysphagia 1 pudding thick liquid diet with latest swallow study 12/25/2011. He continued with IV fluids for hydration secondary to pudding thick liquids.  No specific complaints but eager to go home.   Objective: Vital Signs: Blood pressure 161/90, pulse 73, temperature 97.4 F (36.3 C), temperature source Oral, resp. rate 18, SpO2 99.00%.  nad Chest cta cv- irreg  rate abd- soft, + bs    Assessment/Plan: 1. Functional deficits secondary to R MCA infarct with L HP  Medical Problem List and Plan:  1. 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 DVT Prophylaxis/Anticoagulation: Chronic Coumadin therapy with INR 1.77 12/27/2011. Lab Results  Component Value Date   INR 2.87* 12/31/2011   INR 2.48* 12/30/2011   INR 2.52* 12/29/2011   3. Neuropsych: This patient is not capable of making decisions on his/her own behalf.  4. Recurrent seizures. EEG was negative. Continue Klonopin 0.5 mg each bedtime, vimpat 300mg  bid,keppra 2500mg  Q12hour, Dilantin 100 mg 3 times a day. Monitor closely for a seizure activity  5. Hypertension/atrial fibrillation of new onset. Lisinopril 5 mg daily. Cardiac rate is controlled.  6. Proteus UTI. Intravenous Rocephin 1 g daily x7 days initiated 12/25/2011, change to po keflex  7. Dysphagia. Dysphagia 1 pudding thick liquids. Continue intravenous fluids for hydration  8. Non-insulin-dependent diabetes mellitus: Hemoglobin A1c 6.9.. Patient currently with sliding scale insulin. CBG (last 3)   Basename 12/30/11 2015 12/30/11 1625 12/30/11 1200  GLUCAP 182* 112* 163*      LOS (Days) 4 A FACE TO FACE EVALUATION WAS PERFORMED  SWORDS,BRUCE HENRY 12/31/2011, 10:40 AM

## 2012-01-01 ENCOUNTER — Inpatient Hospital Stay (HOSPITAL_COMMUNITY): Payer: BC Managed Care – PPO | Admitting: Occupational Therapy

## 2012-01-01 ENCOUNTER — Inpatient Hospital Stay (HOSPITAL_COMMUNITY): Payer: BC Managed Care – PPO | Admitting: Physical Therapy

## 2012-01-01 ENCOUNTER — Inpatient Hospital Stay (HOSPITAL_COMMUNITY): Payer: BC Managed Care – PPO | Admitting: Speech Pathology

## 2012-01-01 DIAGNOSIS — I6340 Cerebral infarction due to embolism of unspecified cerebral artery: Secondary | ICD-10-CM

## 2012-01-01 DIAGNOSIS — G8110 Spastic hemiplegia affecting unspecified side: Secondary | ICD-10-CM

## 2012-01-01 LAB — PROTIME-INR
INR: 2.95 — ABNORMAL HIGH (ref 0.00–1.49)
Prothrombin Time: 29.2 seconds — ABNORMAL HIGH (ref 11.6–15.2)

## 2012-01-01 LAB — GLUCOSE, CAPILLARY
Glucose-Capillary: 122 mg/dL — ABNORMAL HIGH (ref 70–99)
Glucose-Capillary: 86 mg/dL (ref 70–99)
Glucose-Capillary: 177 mg/dL — ABNORMAL HIGH (ref 70–99)

## 2012-01-01 MED ORDER — WARFARIN SODIUM 2.5 MG PO TABS
2.5000 mg | ORAL_TABLET | Freq: Once | ORAL | Status: AC
  Administered 2012-01-01: 2.5 mg via ORAL
  Filled 2012-01-01: qty 1

## 2012-01-01 NOTE — Progress Notes (Signed)
Social Work Assessment and Plan Social Work Assessment and Plan  Patient Details  Name: Brendan Scott MRN: [id number] Date of Birth: 12-30-1945  Today's Date: 01/01/2012  Problem List:  Patient Active Problem List  Diagnosis  . DIABETES  . OSTEOARTHRITIS, LOWER LEG  . OSTEOARTHRITIS, KNEE, LEFT  . JOINT EFFUSION, KNEE  . Pain in Joint, Lower Leg  . SCIATICA, RIGHT  . TOTAL KNEE FOLLOW-UP  . Pain, knee  . S/P total knee replacement  . Diabetes mellitus  . Focal motor seizure  . Embolic stroke involving middle cerebral artery  . Aspiration into respiratory tract  . Respiratory distress  . CVA (cerebral infarction)   Past Medical History:  Past Medical History  Diagnosis Date  . Arthritis   . Diabetes mellitus   . Atrial fibrillation    Past Surgical History:  Past Surgical History  Procedure Date  . Total knee arthroplasty     Bilateral   Social History:  reports that he has quit smoking. He does not have any smokeless tobacco history on file. He reports that he does not drink alcohol or use illicit drugs.  Family / Support Systems Marital Status: Married Patient Roles: Spouse;Parent;Other (Comment) (Employee) Spouse/Significant Other: Brendan Scott-wife  [phone]  [phone]  [phone] Other Supports: Two grandchildren live with them Anticipated Caregiver: Wife and other family members Ability/Limitations of Caregiver: Wife works 2nd shift-4p-12a  Grandchildren will be there with and his Mother is next door Caregiver Availability: 24/7 Family Dynamics: Close knit family pt has two brothers who come visit every day.  Wife is here daily unitl 2p when she leaves for work.  Wife confirms someone will be with him at all times at home.  Social History Preferred language: English Religion:  Cultural Background: No issues Education: High School Read: Yes Write: Yes Employment Status: Employed Name of Employer: 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  Will look toward wife to make them at this time.   Abuse/Neglect Physical Abuse: Denies Verbal Abuse: Denies Sexual Abuse: Denies Exploitation of patient/patient's resources: Denies Self-Neglect: Denies  Emotional Status Pt's affect, behavior adn adjustment status: Pt is motivated to make progress.  Wife reports he has always been independent and wants to be again.  He is somewhat confused so will get information form wife when here. Recent Psychosocial Issues: other medical issues Pyschiatric History: No history deferred depression screening until more clear, somewhat confused at this time.  Wife rpeorts he is doing ok with all of this that seizure medicine made him lethargic but his is doing better today. Substance Abuse History: No issues  Patient / Family Perceptions, Expectations & Goals Pt/Family understanding of illness & functional limitations: Wife can explain his stroke and deficits.  She really wants him to be able to drink water since he loved doing this prior to admission.  She feels he will get better it will take time.  She plans to be here daily until he goes home, to see him in therapies. Premorbid pt/family roles/activities: Husband, Father, Grandfather, Son, Employee, Home owner, etc Anticipated changes in roles/activities/participation: Resume Pt/family expectations/goals: Pt states: " I want to eat more than this."  Wife states: " I hope he recovers to where he was before this."  She is involved in pt's care and here daily.    [employer]: None Premorbid  Home Care/DME Agencies: None Transportation available at discharge: Family Resource referrals recommended: Support group (specify) (CVA Support Group)  Discharge Planning Living Arrangements: Spouse/significant other;Other  relatives Support Systems: Spouse/significant other;Other relatives;Friends/neighbors;Church/faith community Type of Residence: Private residence Insurance Resources: [employer] (specify);Medicare Chief Executive Officer) Financial Resources: Employment;Family Support Financial Screen Referred: No Living Expenses: Lives with family Money Management: Patient;Spouse Do you have any problems obtaining your medications?: No Home Management: Wife Patient/Family Preliminary Plans: Return home with wife and grandchildren.  Wife reports pt will have 24 hr care between she and grandchildren.  His Mother lives next door also. Social Work Anticipated Follow Up Needs: HH/OP;Support Group DC Planning Additional Notes/Comments: Pt has had a severe CVA and will ened 24 hr physical care at discharge.  Will work with fmaily on how comfortable grandchildren are tolieting him when wife is working.  Clinical Impression Wife is a strong advocate for pt, she was calling the kitchen to get the potatoes off of his diet.  She is aware he will need someone with him at discharge, but will need education regarding how much care and if grandchildren Are comfortable with assisting with self care.  Will have all do family education prior to discharge.  Hopefully pt will do well here.  Lucy Chris 01/01/2012, 12:56 PM

## 2012-01-01 NOTE — Progress Notes (Signed)
ANTICOAGULATION CONSULT NOTE - Follow Up Consult  Pharmacy Consult for Coumadin Indication: atrial fibrillation  No Known Allergies  Patient Measurements:   Ht: 66", Wt: 95.5 Kg  Vital Signs: Temp: 97.9 F (36.6 C) (12/02 0503) Temp src: Oral (12/02 0503) BP: 133/85 mmHg (12/02 0503) Pulse Rate: 71  (12/02 0503)  Labs:  Alvira Philips 01/01/12 0610 12/31/11 0740 12/30/11 0630  HGB -- -- --  HCT -- -- --  PLT -- -- --  APTT -- -- --  LABPROT 29.2* 28.6* 25.7*  INR 2.95* 2.87* 2.48*  HEPARINUNFRC -- -- --  CREATININE -- -- --  CKTOTAL -- -- --  CKMB -- -- --  TROPONINI -- -- --    The CrCl is unknown because both a height and weight (above a minimum accepted value) are required for this calculation.   Assessment: Mr. Prokop is a 66 y/o male patient with a history of afib who was on ASA 81 mg PTA admitted with stroke, presenting outside of window for TPA.   Patient with suspected meningioma on CT scan. Coumadin per pharmacy started 11/25 and has received 3 doses of 7.5mg  which resulted in a supratherapeutic INR following abrupt increase, noted drug interaction with dilantin (may increase INR). INR today is therapeutic at 2.95, appears to be stabilizing s/p initial jump. CBC ok on 11/28, no bleeding noted.   Goal of Therapy:  INR 2-3   Plan:  1. Coumadin 2.5mg  PO x 1 tonight again 2. F/u AM INR 3. Consider checking a phenytoin level tomorrow and weekly  Thanks, Huie Ghuman K. Allena Katz, PharmD, BCPS.  Clinical Pharmacist Pager [phone]. 01/01/2012 11:34 AM

## 2012-01-01 NOTE — Progress Notes (Addendum)
Patient ID: Brendan Scott, male   DOB: [birth year], 66 y.o.   MRN: [id number] Subjective/Complaints:  66 y.o. right-handed male with history of atrial fibrillation on aspirin therapy prior to admission as well as diabetes mellitus. Admitted 12/15/2011 after being found in his driveway by family unresponsive. EMS arrived and noted patient to be slightly confused with left facial droop and dysarthria. MRI of the brain showed a large right middle cerebral artery distribution nonhemorrhagic infarct as well as incidental findings of large left sphenoid wing mass consistent with meningioma. He was not considered a candidate for intravenous thrombolytic therapy with TPA. MRA of the head showed high-grade stenosis proximal basilar artery. Echocardiogram with ejection fraction of 60% and no wall motion abnormalities without emboli. Cerebral angiogram showed no occlusions or stenosis. There was reported seizure 12/16/2011 and patient has been loaded with Keppra /Dilantin as well as vimpat. EEG did not show definite epileptiform discharges. Throughout his hospital course there as been recurrent seizures and has since resolved as of 12/23/2011 Neurology services consulted presently maintained on aspirin therapy as well as subcutaneous Lovenox for DVT prophylaxis. On 11/25 the EKG consistent with atrial fibrillation with cardiac enzymes negative. Coumadin was initiated 12/25/2011 and aspirin was discontinued. He would remain on Lovenox for bridging until INR greater than 2.00. Speech therapy followup for dysphagia and diet has slowly been advanced to a dysphagia 1 pudding thick liquid diet with latest swallow study 12/25/2011. He continued with IV fluids for hydration secondary to pudding thick liquids.  Objective: Vital Signs: Blood pressure 133/85, pulse 71, temperature 97.9 F (36.6 C), temperature source Oral, resp. rate 18, SpO2 100.00%. No results found. Results for orders placed during the hospital encounter of  12/27/11 (from the past 72 hour(s))  GLUCOSE, CAPILLARY     Status: Abnormal   Collection Time   12/29/11  7:56 AM      Component Value Range Comment   Glucose-Capillary 136 (*) 70 - 99 mg/dL   GLUCOSE, CAPILLARY     Status: Abnormal   Collection Time   12/29/11 11:36 AM      Component Value Range Comment   Glucose-Capillary 165 (*) 70 - 99 mg/dL   GLUCOSE, CAPILLARY     Status: Abnormal   Collection Time   12/29/11  5:01 PM      Component Value Range Comment   Glucose-Capillary 116 (*) 70 - 99 mg/dL   GLUCOSE, CAPILLARY     Status: Abnormal   Collection Time   12/29/11  9:32 PM      Component Value Range Comment   Glucose-Capillary 189 (*) 70 - 99 mg/dL   PROTIME-INR     Status: Abnormal   Collection Time   12/30/11  6:30 AM      Component Value Range Comment   Prothrombin Time 25.7 (*) 11.6 - 15.2 seconds    INR 2.48 (*) 0.00 - 1.49   GLUCOSE, CAPILLARY     Status: Abnormal   Collection Time   12/30/11  7:41 AM      Component Value Range Comment   Glucose-Capillary 151 (*) 70 - 99 mg/dL   GLUCOSE, CAPILLARY     Status: Abnormal   Collection Time   12/30/11 12:00 PM      Component Value Range Comment   Glucose-Capillary 163 (*) 70 - 99 mg/dL   GLUCOSE, CAPILLARY     Status: Abnormal   Collection Time   12/30/11  4:25 PM      Component Value Range Comment  Glucose-Capillary 112 (*) 70 - 99 mg/dL   GLUCOSE, CAPILLARY     Status: Abnormal   Collection Time   12/30/11  8:15 PM      Component Value Range Comment   Glucose-Capillary 182 (*) 70 - 99 mg/dL   PROTIME-INR     Status: Abnormal   Collection Time   12/31/11  7:40 AM      Component Value Range Comment   Prothrombin Time 28.6 (*) 11.6 - 15.2 seconds    INR 2.87 (*) 0.00 - 1.49   GLUCOSE, CAPILLARY     Status: Abnormal   Collection Time   12/31/11  8:14 AM      Component Value Range Comment   Glucose-Capillary 156 (*) 70 - 99 mg/dL   GLUCOSE, CAPILLARY     Status: Abnormal   Collection Time   12/31/11 11:13  AM      Component Value Range Comment   Glucose-Capillary 187 (*) 70 - 99 mg/dL   GLUCOSE, CAPILLARY     Status: Normal   Collection Time   12/31/11  4:19 PM      Component Value Range Comment   Glucose-Capillary 83  70 - 99 mg/dL   GLUCOSE, CAPILLARY     Status: Abnormal   Collection Time   12/31/11  8:19 PM      Component Value Range Comment   Glucose-Capillary 171 (*) 70 - 99 mg/dL    Comment 1 Notify RN     PROTIME-INR     Status: Abnormal   Collection Time   01/01/12  6:10 AM      Component Value Range Comment   Prothrombin Time 29.2 (*) 11.6 - 15.2 seconds    INR 2.95 (*) 0.00 - 1.49      HEENT: L lower facial droop Cardio: RRR Resp: Wheezes and Clear with cough GI: BS positive Extremity:  Pulses positive and No Edema Skin:   Intact Neuro: Lethargic, Cranial Nerve Abnormalities L central VII, Normal Sensory, Abnormal Motor 0/5 L hand , 3-/5 Bi.tri,delt,HF,KE,ankle DF/PF, Dysarthric, Inattention and Other Left neglect no field cut Musc/Skel:  Normal and Other no shoulder pain no ataxia   Assessment/Plan: 1. Functional deficits secondary to embolic R MCA infarct with L HP which require 3+ hours per day of interdisciplinary therapy in a comprehensive inpatient rehab setting. Physiatrist is providing close team supervision and 24 hour management of active medical problems listed below. Physiatrist and rehab team continue to assess barriers to discharge/monitor patient progress toward functional and medical goals. FIM: FIM - Bathing Bathing Steps Patient Completed: Chest;Left Arm;Abdomen;Right Arm Bathing: 3: Mod-Patient completes 5-7 35f 10 parts or 50-74%  FIM - Upper Body Dressing/Undressing Upper body dressing/undressing steps patient completed: Put head through opening of pull over shirt/dress Upper body dressing/undressing: 1: Total-Patient completed less than 25% of tasks FIM - Lower Body Dressing/Undressing Lower body dressing/undressing: 1: Two helpers  FIM -  Hotel manager Devices: Grab bar or rail for support Toileting: 1: Two helpers  FIM - Diplomatic Services operational officer Devices: Elevated toilet seat;Grab bars Toilet Transfers: 1-Two helpers  FIM - Banker Devices: Bed rails Bed/Chair Transfer: 1: Two helpers  FIM - Locomotion: Wheelchair Distance: 150 Locomotion: Wheelchair: 1: Total Assistance/staff pushes wheelchair (Pt<25%) FIM - Locomotion: Ambulation Locomotion: Ambulation Assistive Devices: Other (comment) (HHA) Ambulation/Gait Assistance: 3: Mod assist Locomotion: Ambulation: 2: Travels 50 - 149 ft with moderate assistance (Pt: 50 - 74%)  Comprehension Comprehension Mode: Auditory Comprehension: 3-Understands  basic 50 - 74% of the time/requires cueing 25 - 50%  of the time  Expression Expression Mode: Verbal Expression: 2-Expresses basic 25 - 49% of the time/requires cueing 50 - 75% of the time. Uses single words/gestures.  Social Interaction Social Interaction: 2-Interacts appropriately 25 - 49% of time - Needs frequent redirection.  Problem Solving Problem Solving: 2-Solves basic 25 - 49% of the time - needs direction more than half the time to initiate, plan or complete simple activities  Memory Memory: 2-Recognizes or recalls 25 - 49% of the time/requires cueing 51 - 75% of the time  Medical Problem List and Plan:  1. 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 DVT Prophylaxis/Anticoagulation: Chronic Coumadin therapy with INR 1.77 12/27/2011. Continue Lovenox 40 mg daily  Until INR 2.00. Monitor platelet counts any signs of bleeding  3. Neuropsych: This patient is not capable of making decisions on his/her own behalf.  4. Recurrent seizures. EEG was negative. Continue Klonopin 0.5 mg each bedtime, vimpat 300mg  bid,keppra 2500mg  Q12hour, Dilantin 100 mg 3 times a day. Monitor closely for a seizure activity  5.  Hypertension/atrial fibrillation of new onset. Lisinopril 5 mg daily. Cardiac rate is controlled. There's been no reports of chest pain or shortness of breath  6. Proteus UTI. Intravenous Rocephin 1 g daily x7 days initiated 12/25/2011, change to po keflex  7. Dysphagia. Dysphagia 1 pudding thick liquids.? Recheck MBS this week Continue intravenous fluids for hydration  8. Non-insulin-dependent diabetes mellitus: Hemoglobin A1c 6.9.. Patient currently with sliding scale insulin. Patient was taking Glucophage 500 mg daily prior to admission. Sugars controlled suboptimally at present.  -resume gluchopage at 500mg  qday    LOS (Days) 5 A FACE TO FACE EVALUATION WAS PERFORMED  KIRSTEINS,ANDREW E 01/01/2012, 7:42 AM

## 2012-01-01 NOTE — Progress Notes (Signed)
Occupational Therapy Session Note  Patient Details  Name: Brendan Scott MRN: [id number] Date of Birth: Feb 04, 1945  Today's Date: 01/01/2012 Time: [phone] and [id number] Time Calculation (min): 58 min and 35 min  Short Term Goals: Week 1:  OT Short Term Goal 1 (Week 1): Pt will be able to sit on EOB with supervision. OT Short Term Goal 2 (Week 1): Pt will demonstrate increased LUE motor control by bathing right arm with min assist. OT Short Term Goal 3 (Week 1): Pt will demonstrate improved motor planning skills by donning shirt with mod assist. OT Short Term Goal 4 (Week 1): Pt will transfer squat pivot to w/c and/or toilet with max assist x 1.   Skilled Therapeutic Interventions/Progress Updates:    1) Pt seen for ADL retraining at sink from w/c level with focus on sit <> stand, stand pivot transfers, use of LUE with self-care tasks of bathing and dressing, and initiation and sequencing with bathing and dressing.  Pt in bed soaked in urine upon arrival and reporting need to have BM.  Stand pivot transfer from bed > w/c > toilet with mod assist and use of arm rests and grab bars.  Pt increased alert this session and able to recall working with this therapist on Saturday.  Pt required verbal and tactile cues to attend to LUE and LLE with bathing and verbal cues to attempt to use LUE to assist with bathing and pulling up pants.  Hand over hand assist with opening Lt hand and grasping pants.  Pt with multiple drops of washcloth with Lt hand when attempting to bathe.  Pt with carryover of safety and precautions with swallowing, reporting "don't swallow" thin liquid when brushing his teeth.  Pt's wife present at end of session.  2) 1:1 OT with focus on w/c mobility, attention to Lt body and environment, transfers, and LUE NM re-ed.  Pt propelled w/c with mod verbal cues to keep straight and not run into wall or items on Lt, required physical assistance to maneuver w/c as unable to prevent running into  walls on Lt despite cues for RLE use to assist in steering.  NM re-ed in sitting and standing with focus on grasp and release as well as weight bearing through LUE when reaching into various planes to promote weight shifting through BLE and LUE.  Therapy Documentation Precautions:  Precautions Precautions: Fall Precaution Comments: Very limited L knee flexion ROM Restrictions Weight Bearing Restrictions: No Pain:  Pt with no c/o pain this session.  See FIM for current functional status  Therapy/Group: Individual Therapy  Leonette Monarch 01/01/2012, 10:00 AM

## 2012-01-01 NOTE — Progress Notes (Signed)
Physical Therapy Session Note  Patient Details  Name: Brendan Scott MRN: [id number] Date of Birth: 11/19/1945  Today's Date: 01/01/2012 Time: 1407-1500 Time Calculation (min): 53 min  Short Term Goals: Week 1:  PT Short Term Goal 1 (Week 1): Patient will perform bed mobility on flat bed with min A and 50% cues for initiation and sequencing PT Short Term Goal 2 (Week 1): Patient will perform bed <> w/c transfers with min A and 50% cues for initiation and sequencing PT Short Term Goal 3 (Week 1): Patient will perform gait in controlled environment with LRAD x 150' with mod A and 50% cues for safety, sequencing and attention to L environment PT Short Term Goal 4 (Week 1): Patient will perform up and down 4-6 stairs with bilat rails and min A with 50% cues for safety, sequencing  Skilled Therapeutic Interventions/Progress Updates:   Patient noted to have stronger vocalization today.  Performed bilat LE strengthening and ROM on Kinetron at 20 cm/sec in sitting during alternating LE extension with focus on increased LLE knee flexion and ankle DF ROM for transfers and gait.  Performed dynamic standing balance, weight shift and extensor muscle training in standing on Kinetron at 20 cm/sec with bilat UE support and min-mod A for full lateral weight shifting and cues to maintain upright trunk posture.  Continued dynamic standing balance, memory, sequencing, attention to L side, selective attention training in moderately distracting environment during R and L toe taps to number targets in various patterns progressing from one step commands >> 2-3 step commands.  Performed gait in controlled environment x 150' with HHA with focus on selective attention and attention to L environment during vertical and horizontal head turns, changes in gait speed, sudden stops, lateral and retro stepping for obstacle negotiation.  Patient able to attend to objects and correctly answer questions about things in environment to the R  but had delayed processing, reaction time and decreased ability to find objects in L environment. Returned to room and patient requested to lie down.  Performed lateral scoots on bed with verbal, visual cues for sequencing.  Sit > supine with mod A.  Therapy Documentation Precautions:  Precautions Precautions: Fall Precaution Comments: Very limited L knee flexion ROM Restrictions Weight Bearing Restrictions: No Vital Signs: Therapy Vitals Temp: 97.4 F (36.3 C) Temp src: Oral Pulse Rate: 71  Resp: 17  BP: 144/83 mmHg Patient Position, if appropriate: Lying Oxygen Therapy SpO2: 100 % O2 Device: None (Room air) Pain: Pain Assessment Pain Assessment: No/denies pain  See FIM for current functional status  Therapy/Group: Individual Therapy  Edman Circle [facility] 01/01/2012, 4:47 PM

## 2012-01-01 NOTE — Progress Notes (Signed)
Speech Language Pathology Daily Session Note  Patient Details  Name: Brendan Scott MRN: [id number] Date of Birth: [birth year]  Today's Date: 01/01/2012 Time: 1135-1220 Time Calculation (min): 45 min  Short Term Goals: Week 1: SLP Short Term Goal 1 (Week 1): Pt will self-monitor and correct left anterior spillage of secretions with Mod verbal and question cues.  SLP Short Term Goal 2 (Week 1): Pt will utilize swallowing compensatory strategies of multiple swallows and small bites with current diet textures with Mod A verbal cues.  SLP Short Term Goal 3 (Week 1): Pt will demonstrate sustained attention to a functional task for 5 minutes with Mod A verbal and question cues for redirection SLP Short Term Goal 4 (Week 1): Pt will attend to left enviornment/body with Mod A tactile and verbal cues.  SLP Short Term Goal 5 (Week 1): Pt will maintain topic of conversation for ~3 turns with Max A verbal and question cues for redirection.  SLP Short Term Goal 6 (Week 1): Pt will identify 1 physical and 1 cognitive deficit with Mod A semantic cues.   Skilled Therapeutic Interventions: Skilled treatment session targeted dysphagia management.  SLP facilitated session with mod-max A multimodal cuing for utilization of safe swallowing strategies during self-feeding of lunch tray.  Patient had intermittent coughing throughout self-feeding task which SLP suspects to be caused by pharyngeal residue secondary to muscle weakness and delayed swallow initiation.  Reflexive and volitional cough and intermittent throat clear appeared to clear suspected penetrates.  Patient exhibited sustained attention for periods of approximately 1-2 minutes with mod-max A multimodal cues for redirection during a basic familiar task, and was oriented to time and situation with mod A verbal and question cuing.  SLP facilitated session with mod-max A question cues for recall of safety precautions and to facilitate improved intellectual  awareness.  Patient also exhibited decreased initiation and completion of functional basic task in a timely manner with max assist multimodal cuing.  Patient with decreased arousal and frequently fell asleep during conversations this session as a result of medication per wife report; as a result, SLP ended session early.     FIM:  Comprehension Comprehension Mode: Auditory Comprehension: 3-Understands basic 50 - 74% of the time/requires cueing 25 - 50%  of the time Expression Expression Mode: Verbal Expression: 3-Expresses basic 50 - 74% of the time/requires cueing 25 - 50% of the time. Needs to repeat parts of sentences. Social Interaction Social Interaction: 2-Interacts appropriately 25 - 49% of time - Needs frequent redirection. Problem Solving Problem Solving: 2-Solves basic 25 - 49% of the time - needs direction more than half the time to initiate, plan or complete simple activities Memory Memory: 3-Recognizes or recalls 50 - 74% of the time/requires cueing 25 - 49% of the time FIM - Eating Eating Activity: 4: Helper checks for pocketed food  Pain Pain Assessment Pain Assessment: No/denies pain  Therapy/Group: Individual Therapy  Jackalyn Lombard, Conrad [place]  Graduate Clinician Speech Language Pathology   Page, Joni Reining 01/01/2012, 2:51 PM  The skilled treatment note has been reviewed and SLP is in agreement. Fae Pippin, M.A., CCC-SLP [phone]

## 2012-01-02 ENCOUNTER — Inpatient Hospital Stay (HOSPITAL_COMMUNITY): Payer: BC Managed Care – PPO | Admitting: Occupational Therapy

## 2012-01-02 ENCOUNTER — Inpatient Hospital Stay (HOSPITAL_COMMUNITY): Payer: BC Managed Care – PPO | Admitting: *Deleted

## 2012-01-02 ENCOUNTER — Inpatient Hospital Stay (HOSPITAL_COMMUNITY): Payer: BC Managed Care – PPO | Admitting: Speech Pathology

## 2012-01-02 ENCOUNTER — Inpatient Hospital Stay (HOSPITAL_COMMUNITY): Payer: BC Managed Care – PPO | Admitting: Physical Therapy

## 2012-01-02 DIAGNOSIS — I6340 Cerebral infarction due to embolism of unspecified cerebral artery: Secondary | ICD-10-CM

## 2012-01-02 DIAGNOSIS — G8110 Spastic hemiplegia affecting unspecified side: Secondary | ICD-10-CM

## 2012-01-02 LAB — PROTIME-INR
INR: 2.73 — ABNORMAL HIGH (ref 0.00–1.49)
Prothrombin Time: 27.6 seconds — ABNORMAL HIGH (ref 11.6–15.2)

## 2012-01-02 LAB — GLUCOSE, CAPILLARY: Glucose-Capillary: 155 mg/dL — ABNORMAL HIGH (ref 70–99)

## 2012-01-02 LAB — PHENYTOIN LEVEL, TOTAL: Phenytoin Lvl: 8.9 ug/mL — ABNORMAL LOW (ref 10.0–20.0)

## 2012-01-02 MED ORDER — WARFARIN SODIUM 5 MG PO TABS
5.0000 mg | ORAL_TABLET | Freq: Once | ORAL | Status: AC
  Administered 2012-01-02: 5 mg via ORAL
  Filled 2012-01-02: qty 1

## 2012-01-02 NOTE — Progress Notes (Signed)
Speech Language Pathology Daily Session Note  Patient Details  Name: Brendan Scott MRN: [id number] Date of Birth: 1945/06/15  Today's Date: 01/02/2012 Time: 6213-0865 Time Calculation (min): 60 min  Short Term Goals: Week 1: SLP Short Term Goal 1 (Week 1): Pt will self-monitor and correct left anterior spillage of secretions with Mod verbal and question cues.  SLP Short Term Goal 2 (Week 1): Pt will utilize swallowing compensatory strategies of multiple swallows and small bites with current diet textures with Mod A verbal cues.  SLP Short Term Goal 3 (Week 1): Pt will demonstrate sustained attention to a functional task for 5 minutes with Mod A verbal and question cues for redirection SLP Short Term Goal 4 (Week 1): Pt will attend to left enviornment/body with Mod A tactile and verbal cues.  SLP Short Term Goal 5 (Week 1): Pt will maintain topic of conversation for ~3 turns with Max A verbal and question cues for redirection.  SLP Short Term Goal 6 (Week 1): Pt will identify 1 physical and 1 cognitive deficit with Mod A semantic cues.   Skilled Therapeutic Interventions: Skilled treatment session targeted dysphagia and cognitive goals during functional tasks. SLP facilitated session with trials of honey-thick liquids via spoon and cup with suspected delay and however, SLP was able to fade cues for small sips, double swallows and hard cough from max assist to mod assist multimodal cuing.  Patient exhibited sustained attention for periods of approximately 1-2 minutes during a basic money management task.  With max assist verbal and tactile cues to demonstrate timely initiation and cessation of steps to sort and count change.    FIM:  Comprehension Comprehension Mode: Auditory Comprehension: 2-Understands basic 25 - 49% of the time/requires cueing 51 - 75% of the time Expression Expression Mode: Verbal Expression: 2-Expresses basic 25 - 49% of the time/requires cueing 50 - 75% of the time. Uses  single words/gestures. Social Interaction Social Interaction: 2-Interacts appropriately 25 - 49% of time - Needs frequent redirection. Problem Solving Problem Solving: 2-Solves basic 25 - 49% of the time - needs direction more than half the time to initiate, plan or complete simple activities Memory Memory: 2-Recognizes or recalls 25 - 49% of the time/requires cueing 51 - 75% of the time FIM - Eating Eating Activity: 4: Help with managing cup/glass  Pain Pain Assessment Pain Assessment: No/denies pain  Therapy/Group: Individual Therapy  Charlane Ferretti., CCC-SLP [phone]  Brendan Scott 01/02/2012, 12:37 PM

## 2012-01-02 NOTE — Progress Notes (Signed)
ANTICOAGULATION CONSULT NOTE - Follow Up Consult  Pharmacy Consult for Coumadin Indication: atrial fibrillation  No Known Allergies  Patient Measurements:   Ht: 66", Wt: 95.5 Kg  Vital Signs: Temp: 97.5 F (36.4 C) (12/03 0500) Temp src: Oral (12/03 0500) BP: 144/86 mmHg (12/03 0500) Pulse Rate: 67  (12/03 0500)  Labs:  Basename 01/02/12 0600 01/01/12 0610 12/31/11 0740  HGB -- -- --  HCT -- -- --  PLT -- -- --  APTT -- -- --  LABPROT 27.6* 29.2* 28.6*  INR 2.73* 2.95* 2.87*  HEPARINUNFRC -- -- --  CREATININE -- -- --  CKTOTAL -- -- --  CKMB -- -- --  TROPONINI -- -- --    The CrCl is unknown because both a height and weight (above a minimum accepted value) are required for this calculation.   Assessment: Mr. Brendan Scott is a 66 y/o male patient with a history of afib who was on ASA 81 mg PTA admitted with stroke, presenting outside of window for TPA.   Patient with suspected meningioma on CT scan. Coumadin per pharmacy started 11/25 and has received 3 doses of 7.5mg  which resulted in a supratherapeutic INR following abrupt increase, noted drug interaction with dilantin (may increase INR). INR today is therapeutic at 2.73, appears to be stabilizing s/p initial jump. CBC ok on 11/28, no bleeding noted.   Noted dilantin level today is 8.9, which corrects to 13.9 mcg/ml, which is within therapeutic range of 10-15 mcg/ml.  Goal of Therapy:  INR 2-3   Plan:  1. Coumadin 5mg  PO x 1 tonight, will try this as maintenance dose if INR remains stable. 2. F/u AM INR  Thanks, Amberli Ruegg K. Allena Katz, PharmD, BCPS.  Clinical Pharmacist Pager [phone]. 01/02/2012 9:57 AM

## 2012-01-02 NOTE — Progress Notes (Signed)
Physical Therapy Session Note  Patient Details  Name: Brendan Scott MRN: [id number] Date of Birth: 28-Dec-1945  Today's Date: 01/02/2012 Time: 4098-1191 Time Calculation (min): 55 min  Short Term Goals: Week 1:  PT Short Term Goal 1 (Week 1): Patient will perform bed mobility on flat bed with min A and 50% cues for initiation and sequencing PT Short Term Goal 2 (Week 1): Patient will perform bed <> w/c transfers with min A and 50% cues for initiation and sequencing PT Short Term Goal 3 (Week 1): Patient will perform gait in controlled environment with LRAD x 150' with mod A and 50% cues for safety, sequencing and attention to L environment PT Short Term Goal 4 (Week 1): Patient will perform up and down 4-6 stairs with bilat rails and min A with 50% cues for safety, sequencing  Skilled Therapeutic Interventions/Progress Updates:   Patient fatigued this pm;  Performed w/c mobility, attention to task, attention to L environment and sequence training with use of R hemi technique for w/c propulsion in controlled environment with max A and total verbal cues for R foot propulsion, navigation around obstacles and problem solving to prevent hitting wall on L.  Performed standing balance, lateral weight shifting and endurance training at tall table while sorting and picking up clothes pins according to color from L and placing them on tall target to address selective attention, attention to L, initiation and cessation of activity, sequencing; attempted picking up with L hand but unable to maintain grip so changed to RUE.  Continued balance, LE strengthening, sequencing, attention to task and L, initiation and cessation of activity in standing reaching low to floor to pick up specified number or color of bean bags and toss at target; again attempted to perform with LUE but unable to maintain grip on bean bag and changed to RUE and then picking up all bean bags from floor and placing them back into box from  sustained squat position with min A; patient required min A overall for both activities but with mod-max verbal and visual cues sequencing, attention to task and L and for initiation; progressed to intermittent verbal cues for initiation.  Patient reporting need to have BM; transferred w/c > toilet with HHA and stood to doff clothes with min A.  To finish toileting with nurse tech.   Therapy Documentation Precautions:  Precautions Precautions: Fall Precaution Comments: Very limited L knee flexion ROM  Restrictions Weight Bearing Restrictions: No Pain: Pain Assessment Pain Assessment: No/denies pain  See FIM for current functional status  Therapy/Group: Individual Therapy  Edman Circle [facility] 01/02/2012, 5:07 PM

## 2012-01-02 NOTE — Progress Notes (Signed)
Physical Therapy Session Note  Patient Details  Name: Brendan Scott MRN: [id number] Date of Birth: [birth year]  Today's Date: 01/02/2012 Time: 1300-1330 Time Calculation (min): 30 min  Short Term Goals: Week 1:  PT Short Term Goal 1 (Week 1): Patient will perform bed mobility on flat bed with min A and 50% cues for initiation and sequencing PT Short Term Goal 2 (Week 1): Patient will perform bed <> w/c transfers with min A and 50% cues for initiation and sequencing PT Short Term Goal 3 (Week 1): Patient will perform gait in controlled environment with LRAD x 150' with mod A and 50% cues for safety, sequencing and attention to L environment PT Short Term Goal 4 (Week 1): Patient will perform up and down 4-6 stairs with bilat rails and min A with 50% cues for safety, sequencing  Skilled Therapeutic Interventions/Progress Updates:    Patient received sitting in wheelchair with wife present. 8 cones placed around gym. Patient given instructions that 8 cones have been placed around gym and he is find all 8 for activity to be over. Patient ambulates around gym searching for cones without AD and min-mod assist. Patient requires mod assist when reaching for some cones or if he is perseverating on going a certain direction. As he collects cones, patient is prompted as to how many cones he has, how many are left, where he has looked for cones, where he hasn't looked, and safest route to cones, etc. Initially, patient perseverative with using L hand to pick up objects. Patient redirected and given instructions to use R hand. Patient able to count and keep track of the cones he has collected and correctly identify how many remain. After patient has collected all the cones in one area of the gym, he perseverates on continuing to look in that area. Patient requires max verbal cues and prompting as to other areas of the gym he could look for more cones.  One cone located with obstacles in the way, including door.  Prompted patient as to how he would safely collect this cone. Patient does not answer and begins to ambulate quickly towards cone, moving door that is in the way, demonstrating problem solving with obstacle negotiation, however this route to the cone still had other smaller obstacles in the way. Educated patient it was safer to go around obstacles the other way to collect cone, as there were less obstacles in the way if he took the other route. Patient required one rest break during activity.  Patient returned to room, quick release belt donned, and left with all needs within reach.  Therapy Documentation Precautions:  Precautions Precautions: Fall Precaution Comments: Very limited L knee flexion ROM  Restrictions Weight Bearing Restrictions: No Pain: Pain Assessment Pain Assessment: No/denies pain Locomotion : Ambulation Ambulation/Gait Assistance: 3: Mod assist Wheelchair Mobility Distance: 150   See FIM for current functional status  Therapy/Group: Individual Therapy  Chipper Herb. Adajah Cocking, PT, DPT  01/02/2012, 1:39 PM

## 2012-01-02 NOTE — Progress Notes (Signed)
Patient ID: Brendan Scott, male   DOB: 03/19/1945, 66 y.o.   MRN: [id number] Subjective/Complaints:  66 y.o. right-handed male with history of atrial fibrillation on aspirin therapy prior to admission as well as diabetes mellitus. Admitted 12/15/2011 after being found in his driveway by family unresponsive. EMS arrived and noted patient to be slightly confused with left facial droop and dysarthria. MRI of the brain showed a large right middle cerebral artery distribution nonhemorrhagic infarct as well as incidental findings of large left sphenoid wing mass consistent with meningioma. He was not considered a candidate for intravenous thrombolytic therapy with TPA. MRA of the head showed high-grade stenosis proximal basilar artery. Echocardiogram with ejection fraction of 60% and no wall motion abnormalities without emboli. Cerebral angiogram showed no occlusions or stenosis. There was reported seizure 12/16/2011 and patient has been loaded with Keppra /Dilantin as well as vimpat. EEG did not show definite epileptiform discharges. Throughout his hospital course there as been recurrent seizures and has since resolved as of 12/23/2011 Neurology services consulted presently maintained on aspirin therapy as well as subcutaneous Lovenox for DVT prophylaxis. On 11/25 the EKG consistent with atrial fibrillation with cardiac enzymes negative. Coumadin was initiated 12/25/2011 and aspirin was discontinued. He would remain on Lovenox for bridging until INR greater than 2.00. Speech therapy followup for dysphagia and diet has slowly been advanced to a dysphagia 1 pudding thick liquid diet with latest swallow study 12/25/2011. He continued with IV fluids for hydration secondary to pudding thick liquids.  Objective: No pain c/os Slept well Review of Systems  Respiratory: Negative for cough and shortness of breath.   Neurological: Positive for focal weakness. Negative for seizures.  All other systems reviewed and are  negative.   Vital Signs: Blood pressure 144/86, pulse 67, temperature 97.5 F (36.4 C), temperature source Oral, resp. rate 18, SpO2 100.00%. No results found. Results for orders placed during the hospital encounter of 12/27/11 (from the past 72 hour(s))  GLUCOSE, CAPILLARY     Status: Abnormal   Collection Time   12/30/11 12:00 PM      Component Value Range Comment   Glucose-Capillary 163 (*) 70 - 99 mg/dL   GLUCOSE, CAPILLARY     Status: Abnormal   Collection Time   12/30/11  4:25 PM      Component Value Range Comment   Glucose-Capillary 112 (*) 70 - 99 mg/dL   GLUCOSE, CAPILLARY     Status: Abnormal   Collection Time   12/30/11  8:15 PM      Component Value Range Comment   Glucose-Capillary 182 (*) 70 - 99 mg/dL   PROTIME-INR     Status: Abnormal   Collection Time   12/31/11  7:40 AM      Component Value Range Comment   Prothrombin Time 28.6 (*) 11.6 - 15.2 seconds    INR 2.87 (*) 0.00 - 1.49   GLUCOSE, CAPILLARY     Status: Abnormal   Collection Time   12/31/11  8:14 AM      Component Value Range Comment   Glucose-Capillary 156 (*) 70 - 99 mg/dL   GLUCOSE, CAPILLARY     Status: Abnormal   Collection Time   12/31/11 11:13 AM      Component Value Range Comment   Glucose-Capillary 187 (*) 70 - 99 mg/dL   GLUCOSE, CAPILLARY     Status: Normal   Collection Time   12/31/11  4:19 PM      Component Value Range Comment  Glucose-Capillary 83  70 - 99 mg/dL   GLUCOSE, CAPILLARY     Status: Abnormal   Collection Time   12/31/11  8:19 PM      Component Value Range Comment   Glucose-Capillary 171 (*) 70 - 99 mg/dL    Comment 1 Notify RN     PROTIME-INR     Status: Abnormal   Collection Time   01/01/12  6:10 AM      Component Value Range Comment   Prothrombin Time 29.2 (*) 11.6 - 15.2 seconds    INR 2.95 (*) 0.00 - 1.49   GLUCOSE, CAPILLARY     Status: Abnormal   Collection Time   01/01/12  7:38 AM      Component Value Range Comment   Glucose-Capillary 122 (*) 70 - 99 mg/dL     Comment 1 Notify RN     GLUCOSE, CAPILLARY     Status: Abnormal   Collection Time   01/01/12 11:27 AM      Component Value Range Comment   Glucose-Capillary 185 (*) 70 - 99 mg/dL    Comment 1 Notify RN     GLUCOSE, CAPILLARY     Status: Normal   Collection Time   01/01/12  4:47 PM      Component Value Range Comment   Glucose-Capillary 86  70 - 99 mg/dL   GLUCOSE, CAPILLARY     Status: Abnormal   Collection Time   01/01/12  8:58 PM      Component Value Range Comment   Glucose-Capillary 177 (*) 70 - 99 mg/dL   PROTIME-INR     Status: Abnormal   Collection Time   01/02/12  6:00 AM      Component Value Range Comment   Prothrombin Time 27.6 (*) 11.6 - 15.2 seconds    INR 2.73 (*) 0.00 - 1.49      HEENT: L lower facial droop Cardio: RRR Resp: Wheezes and Clear with cough GI: BS positive Extremity:  Pulses positive and No Edema Skin:   Intact Neuro: Lethargic, Cranial Nerve Abnormalities L central VII, Normal Sensory, Abnormal Motor 0/5 L hand , 3-/5 Bi.tri,delt,HF,KE,ankle DF/PF, Dysarthric, Inattention and Other Left neglect no field cut Musc/Skel:  Normal and Other no shoulder pain no ataxia   Assessment/Plan: 1. Functional deficits secondary to embolic R MCA infarct with L HP which require 3+ hours per day of interdisciplinary therapy in a comprehensive inpatient rehab setting. Physiatrist is providing close team supervision and 24 hour management of active medical problems listed below. Physiatrist and rehab team continue to assess barriers to discharge/monitor patient progress toward functional and medical goals. FIM: FIM - Bathing Bathing Steps Patient Completed: Chest;Left Arm;Abdomen;Front perineal area;Right upper leg;Left upper leg Bathing: 3: Mod-Patient completes 5-7 54f 10 parts or 50-74%  FIM - Upper Body Dressing/Undressing Upper body dressing/undressing steps patient completed: Put head through opening of pull over shirt/dress Upper body dressing/undressing: 2:  Max-Patient completed 25-49% of tasks FIM - Lower Body Dressing/Undressing Lower body dressing/undressing: 1: Total-Patient completed less than 25% of tasks  FIM - Toileting Toileting steps completed by patient: Performs perineal hygiene Toileting Assistive Devices: Grab bar or rail for support Toileting: 2: Max-Patient completed 1 of 3 steps  FIM - Diplomatic Services operational officer Devices: Elevated toilet seat;Grab bars Toilet Transfers: 3-To toilet/BSC: Mod A (lift or lower assist);3-From toilet/BSC: Mod A (lift or lower assist)  FIM - Bed/Chair Transfer Bed/Chair Transfer Assistive Devices: Arm rests Bed/Chair Transfer: 3: Sit > Supine: Mod  A (lifting assist/Pt. 50-74%/lift 2 legs);3: Bed > Chair or W/C: Mod A (lift or lower assist);3: Chair or W/C > Bed: Mod A (lift or lower assist)  FIM - Locomotion: Wheelchair Distance: 150 Locomotion: Wheelchair: 1: Total Assistance/staff pushes wheelchair (Pt<25%) FIM - Locomotion: Ambulation Locomotion: Ambulation Assistive Devices: Other (comment) (HHA) Ambulation/Gait Assistance: 3: Mod assist Locomotion: Ambulation: 3: Travels 150 ft or more with moderate assistance (Pt: 50 - 74%)  Comprehension Comprehension Mode: Auditory Comprehension: 3-Understands basic 50 - 74% of the time/requires cueing 25 - 50%  of the time  Expression Expression Mode: Verbal Expression: 3-Expresses basic 50 - 74% of the time/requires cueing 25 - 50% of the time. Needs to repeat parts of sentences.  Social Interaction Social Interaction: 2-Interacts appropriately 25 - 49% of time - Needs frequent redirection.  Problem Solving Problem Solving: 2-Solves basic 25 - 49% of the time - needs direction more than half the time to initiate, plan or complete simple activities  Memory Memory: 3-Recognizes or recalls 50 - 74% of the time/requires cueing 25 - 49% of the time  Medical Problem List and Plan:  1. large right middle cerebral artery  distribution nonhemorrhagic infarct  2. DVT Prophylaxis/Anticoagulation: Chronic Coumadin therapy with INR 1.77 12/27/2011. Continue Lovenox 40 mg daily  Until INR 2.00. Monitor platelet counts any signs of bleeding  3. Neuropsych: This patient is not capable of making decisions on his/her own behalf.  4. Recurrent seizures. EEG was negative. Continue Klonopin 0.5 mg each bedtime, vimpat 300mg  bid,keppra 2500mg  Q12hour, Dilantin 100 mg 3 times a day. Monitor closely for a seizure activity  5. Hypertension/atrial fibrillation of new onset. Lisinopril 5 mg daily. Cardiac rate is controlled. There's been no reports of chest pain or shortness of breath  6. Proteus UTI. Intravenous Rocephin 1 g daily x7 days initiated 12/25/2011, change to po keflex  7. Dysphagia. Dysphagia 1 pudding thick liquids.? Recheck MBS this week Continue intravenous fluids for hydration  8. Non-insulin-dependent diabetes mellitus: Hemoglobin A1c 6.9.. Patient currently with sliding scale insulin. Patient was taking Glucophage 500 mg daily prior to admission. Sugars controlled suboptimally at present.  -resume gluchopage at 500mg  qday    LOS (Days) 6 A FACE TO FACE EVALUATION WAS PERFORMED  Daryl Quiros E 01/02/2012, 7:50 AM

## 2012-01-02 NOTE — Progress Notes (Signed)
Occupational Therapy Session Note  Patient Details  Name: Brendan Scott MRN: [id number] Date of Birth: [birth year]  Today's Date: 01/02/2012 Time: 4098-1191 Time Calculation (min): 60 min  Short Term Goals: Week 1:  OT Short Term Goal 1 (Week 1): Pt will be able to sit on EOB with supervision. OT Short Term Goal 2 (Week 1): Pt will demonstrate increased LUE motor control by bathing right arm with min assist. OT Short Term Goal 3 (Week 1): Pt will demonstrate improved motor planning skills by donning shirt with mod assist. OT Short Term Goal 4 (Week 1): Pt will transfer squat pivot to w/c and/or toilet with max assist x 1.   Skilled Therapeutic Interventions/Progress Updates:    Pt seen for ADL retraining with focus on sit <> stand, transfers, use of LUE with self-care tasks, and initiation, cessation, sequencing, and problem solving with bathing and dressing.  Pt completed bathing at sink with multiple verbal cues for initiation, thoroughness, and sequencing.  Pt with attempts to incorporate LUE into bathing, requiring hand over hand assist to maintain grasp on washcloth and increase positioning to assist in washing RUE.  Verbal and tactile cues provided to attend to LLE with bathing and hand over hand assist with pulling up pants.  Pt with increased visual/perceptual deficits with inability to sequence and problem solve donning shirt and pants, requiring max verbal and tactile cues for sequencing.  Therapy Documentation Precautions:  Precautions Precautions: Fall Precaution Comments: Very limited L knee flexion ROM Restrictions Weight Bearing Restrictions: No Pain: Pain Assessment Pain Assessment: No/denies pain  See FIM for current functional status  Therapy/Group: Individual Therapy  Leonette Monarch 01/02/2012, 12:23 PM

## 2012-01-02 NOTE — Progress Notes (Signed)
INITIAL ADULT NUTRITION ASSESSMENT Date: 01/02/2012   Time: 10:03 AM Reason for Assessment: Consult for help with food choices.  Does not want mashed potatoes at every lunch and dinner.  ASSESSMENT: Male 66 y.o.  Dx: CVA (cerebral infarction)  Hx:  Past Medical History  Diagnosis Date  . Arthritis   . Diabetes mellitus   . Atrial fibrillation    Past Surgical History  Procedure Date  . Total knee arthroplasty     Bilateral    Related Meds:     . cephALEXin  250 mg Oral Q8H  . clonazePAM  0.5 mg Oral QHS  . feeding supplement  1 Container Oral TID PC & HS  . insulin aspart  0-9 Units Subcutaneous TID WC  . lacosamide  300 mg Oral BID  . levETIRAcetam  2,500 mg Oral Q12H  . lisinopril  5 mg Oral Daily  . metFORMIN  500 mg Oral Q breakfast  . phenytoin  100 mg Oral TID  . senna-docusate  1 tablet Oral QHS  . simvastatin  20 mg Oral q1800  . [COMPLETED] warfarin  2.5 mg Oral ONCE-1800  . warfarin  5 mg Oral ONCE-1800  . warfarin   Does not apply Once  . Warfarin - Pharmacist Dosing Inpatient   Does not apply q1800   Ht:  5'6" (142 cm)  Wt:  210 lbs 8.6 oz (95.5 kg)  11/26  Ideal Wt:    64.5 kg % Ideal Wt: 148  Usual Wt:  Wt Readings from Last 10 Encounters:  12/26/11 210 lb 8.6 oz (95.5 kg)  10/18/10 213 lb (96.616 kg)  % Usual Wt: 99  BMI:  35.4- obesity grade 2  Labs:  CMP     Component Value Date/Time   NA 137 12/29/2011 0555   K 3.7 12/29/2011 0555   CL 103 12/29/2011 0555   CO2 26 12/29/2011 0555   GLUCOSE 151* 12/29/2011 0555   BUN 14 12/29/2011 0555   CREATININE 1.09 12/29/2011 0555   CALCIUM 9.2 12/29/2011 0555   PROT 6.5 12/29/2011 0555   ALBUMIN 2.7* 12/29/2011 0555   AST 42* 12/29/2011 0555   ALT 75* 12/29/2011 0555   ALKPHOS 107 12/29/2011 0555   BILITOT 0.2* 12/29/2011 0555   GFRNONAA 69* 12/29/2011 0555   GFRAA 80* 12/29/2011 0555   Lab Results  Component Value Date   HGBA1C 6.9* 12/16/2011   CBG (last 3)   Basename 01/02/12  0712 01/01/12 2058 01/01/12 1647  GLUCAP 128* 177* 86     I/O last 3 completed shifts: In: 220 [P.O.:220] Out: 1400 [Urine:1400]    Diet Order: Dysphagia 1 with pudding thick liquids  Supplements/Tube Feeding:  Ensure Pudding tid  IVF:    sodium chloride Last Rate: Stopped (01/02/12 0742)    Estimated Nutritional Needs:   Kcal: 1800-1900 Protein: 90-100 Fluid: 1.8-1.9L  Food/Nutrition Related Hx: Spoke with patient and wife.  Pt states that patient did not follow the diabetic diet prior to admit.  He would eat many high fat foods including fried fat back and regular soda.  Wife now concerned with his diet.  Does not want patient to have mashed potatoes except for once a week.  Pt with 75% intake Dysphagia 1 diet with pudding thick liquids and Ensure pudding tid.  Receives IVF at night to meet fluid needs.  Swallow to be re evaluated this week to see about advancement.  NUTRITION DIAGNOSIS: -Inadequate oral intake (NI-2.1).  Status: Ongoing  RELATED TO: dysphagia  AS EVIDENCE BY: dysphagia 1 pudding thick liquid diet/ CHO MOD MED  MONITORING/EVALUATION(Goals): Intake, labs, weight Intake of >90% meals and supplements  EDUCATION NEEDS: -Education needs addressed-Wife stated guidelines, bake, broil, grill, reduce fat and use of alternative sweeteners to sugar.  INTERVENTION: Informed kitchen of preference not to have mashed potatoes on tray except for occasionally.  Will provide double vegetables. Discussed need for good intake.     DOCUMENTATION CODES Per approved criteria  -Obesity grade 2   Oran Rein, RD, LDN Clinical Inpatient Dietitian Pager:  [phone] Weekend and after hours pager:  [phone]   01/02/2012, 10:03 AM

## 2012-01-02 NOTE — Evaluation (Signed)
Recreational Therapy Assessment and Plan  Patient Details  Name: Brendan Scott MRN: [id number] Date of Birth: 1945-09-10 Today's Date: 01/02/2012  Rehab Potential: Good ELOS:     Assessment Clinical Impression: Problem List:  Patient Active Problem List   Diagnosis   .  DIABETES   .  OSTEOARTHRITIS, LOWER LEG   .  OSTEOARTHRITIS, KNEE, LEFT   .  JOINT EFFUSION, KNEE   .  Pain in Joint, Lower Leg   .  SCIATICA, RIGHT   .  TOTAL KNEE FOLLOW-UP   .  Pain, knee   .  S/P total knee replacement   .  Diabetes mellitus   .  Focal motor seizure   .  Embolic stroke involving middle cerebral artery   .  Aspiration into respiratory tract   .  Respiratory distress   .  CVA (cerebral infarction)    Past Medical History:  Past Medical History   Diagnosis  Date   .  Arthritis    .  Diabetes mellitus    .  Atrial fibrillation     Past Surgical History:  Past Surgical History   Procedure  Date   .  Total knee arthroplasty      Bilateral    Assessment & Plan  Clinical Impression: Patient is a 66 y.o. right-handed male with history of atrial fibrillation on aspirin therapy prior to admission as well as diabetes mellitus. Admitted 12/15/2011 after being found in his driveway by family unresponsive. EMS arrived and noted patient to be slightly confused with left facial droop and dysarthria. MRI of the brain showed a large right middle cerebral artery distribution nonhemorrhagic infarct as well as incidental findings of large left sphenoid wing mass consistent with meningioma. He was not considered a candidate for intravenous thrombolytic therapy with TPA. MRA of the head showed high-grade stenosis proximal basilar artery. Echocardiogram with ejection fraction of 60% and no wall motion abnormalities without emboli. Cerebral angiogram showed no occlusions or stenosis. There was reported seizure 12/16/2011 and patient has been loaded with Keppra /Dilantin as well as vimpat. EEG did not show definite  epileptiform discharges. Throughout his hospital course there as been recurrent seizures and has since resolved as of 12/23/2011 Neurology services consulted presently maintained on aspirin therapy as well as subcutaneous Lovenox for DVT prophylaxis. On 11/25 the EKG consistent with atrial fibrillation with cardiac enzymes negative. Coumadin was initiated 12/25/2011 and aspirin was discontinued. Patient transferred to CIR on 12/27/2011 .   Pt presents with decreased activity tolerance, decreased functional mobility, decreased balance, decreased coordination, left sided weakness, left inattention, decreased initiation, decreased attention, decreased awareness, decreased problem solving, decreased safety awareness Limiting pt's independence with leisure/community pursuits.   Leisure History/Participation Premorbid leisure interest/current participation: Brendan Scott - Fishing;Sports - Other (Comment);Brendan Scott care;Nature - Vegetable gardening;Nature - Flower gardening;Community - Public relations account executive (football) Other Leisure Interests: Research officer, political party Leisure Participation Style: With Family/Friends Awareness of Community Resources: Good-identify 3 post discharge leisure resources Psychosocial / Spiritual Spiritual Interests: Brendan Scott Patient agreeable to Pet Therapy: Yes Does patient have pets?: Yes (lab) Social interaction - Mood/Behavior: Cooperative Firefighter Appropriate for Education?: Yes Recreational Therapy Orientation Orientation -Reviewed with patient: Available activity resources Strengths/Weaknesses Patient Strengths/Abilities: Willingness to participate;Active premorbidly Patient weaknesses: Physical limitations  Plan Rec Therapy Plan Is patient appropriate for Therapeutic Recreation?: Yes Rehab Potential: Good Treatment times per week: Min 1 time per week > 20 minutes TR Treatment/Interventions: Adaptive equipment  instruction;1:1 session;Balance/vestibular training;Functional mobility training;Community  reintegration;Cognitive remediation/compensation;Patient/family education;Therapeutic activities;Therapeutic exercise;UE/LE Coordination activities;Wheelchair propulsion/positioning  Recommendations for other services: None  Discharge Criteria: Patient will be discharged from TR if patient refuses treatment 3 consecutive times without medical reason.  If treatment goals not met, if there is a change in medical status, if patient makes no progress towards goals or if patient is discharged from hospital.  The above assessment, treatment plan, treatment alternatives and goals were discussed and mutually agreed upon: by patient  Brendan Scott 01/02/2012, 8:38 AM

## 2012-01-03 ENCOUNTER — Inpatient Hospital Stay (HOSPITAL_COMMUNITY): Payer: BC Managed Care – PPO | Admitting: *Deleted

## 2012-01-03 ENCOUNTER — Inpatient Hospital Stay (HOSPITAL_COMMUNITY): Payer: BC Managed Care – PPO | Admitting: Occupational Therapy

## 2012-01-03 ENCOUNTER — Inpatient Hospital Stay (HOSPITAL_COMMUNITY): Payer: BC Managed Care – PPO | Admitting: Speech Pathology

## 2012-01-03 LAB — PROTIME-INR
INR: 2.29 — ABNORMAL HIGH (ref 0.00–1.49)
Prothrombin Time: 24.2 seconds — ABNORMAL HIGH (ref 11.6–15.2)

## 2012-01-03 LAB — GLUCOSE, CAPILLARY
Glucose-Capillary: 125 mg/dL — ABNORMAL HIGH (ref 70–99)
Glucose-Capillary: 128 mg/dL — ABNORMAL HIGH (ref 70–99)
Glucose-Capillary: 125 mg/dL — ABNORMAL HIGH (ref 70–99)
Glucose-Capillary: 138 mg/dL — ABNORMAL HIGH (ref 70–99)

## 2012-01-03 MED ORDER — WARFARIN SODIUM 5 MG PO TABS
5.0000 mg | ORAL_TABLET | Freq: Once | ORAL | Status: AC
  Administered 2012-01-03: 5 mg via ORAL
  Filled 2012-01-03: qty 1

## 2012-01-03 MED ORDER — LACOSAMIDE 50 MG PO TABS
300.0000 mg | ORAL_TABLET | Freq: Two times a day (BID) | ORAL | Status: DC
  Administered 2012-01-03 – 2012-01-23 (×39): 300 mg via ORAL
  Filled 2012-01-03 (×6): qty 2
  Filled 2012-01-03: qty 6
  Filled 2012-01-03 (×3): qty 2
  Filled 2012-01-03: qty 6
  Filled 2012-01-03 (×4): qty 2
  Filled 2012-01-03: qty 6
  Filled 2012-01-03: qty 2
  Filled 2012-01-03: qty 1
  Filled 2012-01-03 (×6): qty 2
  Filled 2012-01-03: qty 1
  Filled 2012-01-03 (×6): qty 2
  Filled 2012-01-03: qty 6
  Filled 2012-01-03 (×2): qty 2
  Filled 2012-01-03: qty 3
  Filled 2012-01-03 (×8): qty 2

## 2012-01-03 NOTE — Progress Notes (Signed)
Social Work Patient ID: Brendan Scott, male   DOB: 1945/08/09, 66 y.o.   MRN: [id number] Met with pt, wife and mother to inform of team conference goals-supervision/min level and discharge 12/24.  Pt's main goal is to get  Home by Christmas.  Wife is here daily and observing in therapies, she expects a lot from pt in regards to progress. She reports he will have 24 hour  Care at home.  Pt tired form his shower this am.  All agreeable to recommendations and will work toward discharge 12/24.

## 2012-01-03 NOTE — Progress Notes (Signed)
Recreational Therapy Session Note  Patient Details  Name: ASAAD GULLEY MRN: [id number] Date of Birth: 1945-04-13 Today's Date: 01/03/2012 Time:  1110-1130 Pain: no c/o Skilled Therapeutic Interventions/Progress Updates: Session focused on completion of leisure screen and w/c mobility using RUE/RLE with mod assist and moderate verbal cues for techniques & left inattention.  Therapy/Group: Individual Therapy Seini Lannom 01/03/2012, 4:54 PM

## 2012-01-03 NOTE — Progress Notes (Signed)
Speech Language Pathology Daily Session Note  Patient Details  Name: Brendan Scott MRN: [id number] Date of Birth: [birth year]  Today's Date: 01/03/2012 Time: 4098-1191 Time Calculation (min): 35 min  Short Term Goals: Week 1: SLP Short Term Goal 1 (Week 1): Pt will self-monitor and correct left anterior spillage of secretions with Mod verbal and question cues.  SLP Short Term Goal 2 (Week 1): Pt will utilize swallowing compensatory strategies of multiple swallows and small bites with current diet textures with Mod A verbal cues.  SLP Short Term Goal 3 (Week 1): Pt will demonstrate sustained attention to a functional task for 5 minutes with Mod A verbal and question cues for redirection SLP Short Term Goal 4 (Week 1): Pt will attend to left enviornment/body with Mod A tactile and verbal cues.  SLP Short Term Goal 5 (Week 1): Pt will maintain topic of conversation for ~3 turns with Max A verbal and question cues for redirection.  SLP Short Term Goal 6 (Week 1): Pt will identify 1 physical and 1 cognitive deficit with Mod A semantic cues.   Skilled Therapeutic Interventions: Skilled treatment session focused on addressing dysphagia goals.  SLP facilitated session with trials of honey-thick liquid via cup with min assist verbal cues to perform airway protections strategies.  SLP attempted to fade cues for throat clear to only as needed; however, patient continued to demonstrate throat clears and as a result given previous MBSS results along with bedside presentation SLP recommends an objective assessment to assess for presence of penetration.     FIM:  Comprehension Comprehension Mode: Auditory Comprehension: 3-Understands basic 50 - 74% of the time/requires cueing 25 - 50%  of the time Expression Expression Mode: Verbal Expression: 3-Expresses basic 50 - 74% of the time/requires cueing 25 - 50% of the time. Needs to repeat parts of sentences. Social Interaction Social Interaction: 3-Interacts  appropriately 50 - 74% of the time - May be physically or verbally inappropriate. Problem Solving Problem Solving: 2-Solves basic 25 - 49% of the time - needs direction more than half the time to initiate, plan or complete simple activities Memory Memory: 2-Recognizes or recalls 25 - 49% of the time/requires cueing 51 - 75% of the time FIM - Eating Eating Activity: 4: Help with managing cup/glass  Pain Pain Assessment Pain Assessment: No/denies pain  Therapy/Group: Individual Therapy  Charlane Ferretti., CCC-SLP [phone]  Hermila Millis 01/03/2012, 4:28 PM

## 2012-01-03 NOTE — Progress Notes (Signed)
ANTICOAGULATION CONSULT NOTE - Follow Up Consult  Pharmacy Consult for Coumadin Indication: atrial fibrillation  No Known Allergies  Patient Measurements:   Ht: 66", Wt: 95.5 Kg  Vital Signs: Temp: 97.6 F (36.4 C) (12/04 0500) Temp src: Oral (12/04 0500) BP: 119/79 mmHg (12/04 0500) Pulse Rate: 65  (12/04 0500)  Labs:  Basename 01/03/12 0620 01/02/12 0600 01/01/12 0610  HGB -- -- --  HCT -- -- --  PLT -- -- --  APTT -- -- --  LABPROT 24.2* 27.6* 29.2*  INR 2.29* 2.73* 2.95*  HEPARINUNFRC -- -- --  CREATININE -- -- --  CKTOTAL -- -- --  CKMB -- -- --  TROPONINI -- -- --    The CrCl is unknown because both a height and weight (above a minimum accepted value) are required for this calculation.   Assessment: Brendan Scott is a 66 y/o male patient with a history of afib who was on ASA 81 mg PTA admitted with stroke, presenting outside of window for TPA.   Patient with suspected meningioma on CT scan. Coumadin per pharmacy started 11/25- received 3 doses of 7.5mg  which resulted in a supratherapeutic INR following abrupt increase, noted drug interaction with dilantin (may increase INR). INR today is therapeutic at 2.29, noted down-ward trend. CBC ok on 11/28, no bleeding noted.   Noted dilantin level 12/3: 8.9, which corrects to 13.9 mcg/ml, which is within therapeutic range of 10-15 mcg/ml.  Goal of Therapy:  INR 2-3   Plan:  1. Coumadin 5mg  PO x 1 tonight, will try this as maintenance dose if INR remains stable. 2. F/u AM INR  Thanks, Brendan Scott, PharmD, BCPS.  Clinical Pharmacist Pager [phone]. 01/03/2012 9:23 AM

## 2012-01-03 NOTE — Progress Notes (Signed)
Patient ID: Brendan Scott, male   DOB: 1945/04/06, 66 y.o.   MRN: [id number] Subjective/Complaints:  66 y.o. right-handed male with history of atrial fibrillation on aspirin therapy prior to admission as well as diabetes mellitus. Admitted 12/15/2011 after being found in his driveway by family unresponsive. EMS arrived and noted patient to be slightly confused with left facial droop and dysarthria. MRI of the brain showed a large right middle cerebral artery distribution nonhemorrhagic infarct as well as incidental findings of large left sphenoid wing mass consistent with meningioma. He was not considered a candidate for intravenous thrombolytic therapy with TPA. MRA of the head showed high-grade stenosis proximal basilar artery. Echocardiogram with ejection fraction of 60% and no wall motion abnormalities without emboli. Cerebral angiogram showed no occlusions or stenosis. There was reported seizure 12/16/2011 and patient has been loaded with Keppra /Dilantin as well as vimpat. EEG did not show definite epileptiform discharges. Throughout his hospital course there as been recurrent seizures and has since resolved as of 12/23/2011 Neurology services consulted presently maintained on aspirin therapy as well as subcutaneous Lovenox for DVT prophylaxis. On 11/25 the EKG consistent with atrial fibrillation with cardiac enzymes negative. Coumadin was initiated 12/25/2011 and aspirin was discontinued. He would remain on Lovenox for bridging until INR greater than 2.00. Speech therapy followup for dysphagia and diet has slowly been advanced to a dysphagia 1 pudding thick liquid diet with latest swallow study 12/25/2011. He continued with IV fluids for hydration secondary to pudding thick liquids.  Objective: When am I going home Slept well Review of Systems  Respiratory: Negative for cough and shortness of breath.   Neurological: Positive for focal weakness. Negative for seizures.  All other systems reviewed and  are negative.   Vital Signs: Blood pressure 119/79, pulse 65, temperature 97.6 F (36.4 C), temperature source Oral, resp. rate 18, SpO2 100.00%. No results found. Results for orders placed during the hospital encounter of 12/27/11 (from the past 72 hour(s))  GLUCOSE, CAPILLARY     Status: Abnormal   Collection Time   12/31/11 11:13 AM      Component Value Range Comment   Glucose-Capillary 187 (*) 70 - 99 mg/dL   GLUCOSE, CAPILLARY     Status: Normal   Collection Time   12/31/11  4:19 PM      Component Value Range Comment   Glucose-Capillary 83  70 - 99 mg/dL   GLUCOSE, CAPILLARY     Status: Abnormal   Collection Time   12/31/11  8:19 PM      Component Value Range Comment   Glucose-Capillary 171 (*) 70 - 99 mg/dL    Comment 1 Notify RN     PROTIME-INR     Status: Abnormal   Collection Time   01/01/12  6:10 AM      Component Value Range Comment   Prothrombin Time 29.2 (*) 11.6 - 15.2 seconds    INR 2.95 (*) 0.00 - 1.49   GLUCOSE, CAPILLARY     Status: Abnormal   Collection Time   01/01/12  7:38 AM      Component Value Range Comment   Glucose-Capillary 122 (*) 70 - 99 mg/dL    Comment 1 Notify RN     GLUCOSE, CAPILLARY     Status: Abnormal   Collection Time   01/01/12 11:27 AM      Component Value Range Comment   Glucose-Capillary 185 (*) 70 - 99 mg/dL    Comment 1 Notify RN  GLUCOSE, CAPILLARY     Status: Normal   Collection Time   01/01/12  4:47 PM      Component Value Range Comment   Glucose-Capillary 86  70 - 99 mg/dL   GLUCOSE, CAPILLARY     Status: Abnormal   Collection Time   01/01/12  8:58 PM      Component Value Range Comment   Glucose-Capillary 177 (*) 70 - 99 mg/dL   PROTIME-INR     Status: Abnormal   Collection Time   01/02/12  6:00 AM      Component Value Range Comment   Prothrombin Time 27.6 (*) 11.6 - 15.2 seconds    INR 2.73 (*) 0.00 - 1.49   GLUCOSE, CAPILLARY     Status: Abnormal   Collection Time   01/02/12  7:12 AM      Component Value Range  Comment   Glucose-Capillary 128 (*) 70 - 99 mg/dL    Comment 1 Notify RN     PHENYTOIN LEVEL, TOTAL     Status: Abnormal   Collection Time   01/02/12  8:11 AM      Component Value Range Comment   Phenytoin Lvl 8.9 (*) 10.0 - 20.0 ug/mL   GLUCOSE, CAPILLARY     Status: Abnormal   Collection Time   01/02/12 11:16 AM      Component Value Range Comment   Glucose-Capillary 192 (*) 70 - 99 mg/dL    Comment 1 Notify RN     GLUCOSE, CAPILLARY     Status: Abnormal   Collection Time   01/02/12  4:39 PM      Component Value Range Comment   Glucose-Capillary 124 (*) 70 - 99 mg/dL    Comment 1 Notify RN     GLUCOSE, CAPILLARY     Status: Abnormal   Collection Time   01/02/12  9:19 PM      Component Value Range Comment   Glucose-Capillary 162 (*) 70 - 99 mg/dL   GLUCOSE, CAPILLARY     Status: Abnormal   Collection Time   01/02/12 11:10 PM      Component Value Range Comment   Glucose-Capillary 155 (*) 70 - 99 mg/dL   PROTIME-INR     Status: Abnormal   Collection Time   01/03/12  6:20 AM      Component Value Range Comment   Prothrombin Time 24.2 (*) 11.6 - 15.2 seconds    INR 2.29 (*) 0.00 - 1.49      HEENT: L lower facial droop Cardio: RRR Resp: Wheezes and Clear with cough GI: BS positive Extremity:  Pulses positive and No Edema Skin:   Intact Neuro: Lethargic, Cranial Nerve Abnormalities L central VII, Normal Sensory, Abnormal Motor 0/5 L hand , 3-/5 Bi.tri,delt,HF,KE,ankle DF/PF, Dysarthric, Inattention and Other Left neglect no field cut Musc/Skel:  Normal and Other no shoulder pain no ataxia   Assessment/Plan: 1. Functional deficits secondary to embolic R MCA infarct with L HP which require 3+ hours per day of interdisciplinary therapy in a comprehensive inpatient rehab setting. Physiatrist is providing close team supervision and 24 hour management of active medical problems listed below. Physiatrist and rehab team continue to assess barriers to discharge/monitor patient progress  toward functional and medical goals. FIM: FIM - Bathing Bathing Steps Patient Completed: Chest;Left Arm;Abdomen;Front perineal area;Right upper leg;Left upper leg Bathing: 3: Mod-Patient completes 5-7 43f 10 parts or 50-74%  FIM - Upper Body Dressing/Undressing Upper body dressing/undressing steps patient completed: Put head through opening  of pull over shirt/dress Upper body dressing/undressing: 2: Max-Patient completed 25-49% of tasks FIM - Lower Body Dressing/Undressing Lower body dressing/undressing: 1: Total-Patient completed less than 25% of tasks  FIM - Toileting Toileting steps completed by patient: Adjust clothing prior to toileting Toileting Assistive Devices: Grab bar or rail for support Toileting: 2: Max-Patient completed 1 of 3 steps  FIM - Diplomatic Services operational officer Devices: Grab bars Toilet Transfers: 3-To toilet/BSC: Mod A (lift or lower assist);3-From toilet/BSC: Mod A (lift or lower assist)  FIM - Bed/Chair Transfer Bed/Chair Transfer Assistive Devices: Arm rests;Bed rails;HOB elevated Bed/Chair Transfer: 3: Bed > Chair or W/C: Mod A (lift or lower assist);3: Chair or W/C > Bed: Mod A (lift or lower assist)  FIM - Locomotion: Wheelchair Distance: 150 Locomotion: Wheelchair: 2: Travels 150 ft or more: maneuvers on rugs and over door sills with maximal assistance (Pt: 25 - 49%) FIM - Locomotion: Ambulation Locomotion: Ambulation Assistive Devices: Other (comment) (HHA) Ambulation/Gait Assistance: 3: Mod assist Locomotion: Ambulation: 0: Activity did not occur  Comprehension Comprehension Mode: Auditory Comprehension: 3-Understands basic 50 - 74% of the time/requires cueing 25 - 50%  of the time  Expression Expression Mode: Verbal Expression: 2-Expresses basic 25 - 49% of the time/requires cueing 50 - 75% of the time. Uses single words/gestures.  Social Interaction Social Interaction: 3-Interacts appropriately 50 - 74% of the time - May be  physically or verbally inappropriate.  Problem Solving Problem Solving: 3-Solves basic 50 - 74% of the time/requires cueing 25 - 49% of the time  Memory Memory: 2-Recognizes or recalls 25 - 49% of the time/requires cueing 51 - 75% of the time  Medical Problem List and Plan:  1. 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 DVT Prophylaxis/Anticoagulation: Chronic Coumadin therapy with INR 1.77 12/27/2011. Continue Lovenox 40 mg daily  Until INR 2.00. Monitor platelet counts any signs of bleeding  3. Neuropsych: This patient is not capable of making decisions on his/her own behalf.  4. Recurrent seizures. EEG was negative. Continue Klonopin 0.5 mg each bedtime, vimpat 300mg  bid,keppra 2500mg  Q12hour, Dilantin 100 mg 3 times a day. Monitor closely for a seizure activity  5. Hypertension/atrial fibrillation of new onset. Lisinopril 5 mg daily. Cardiac rate is controlled. There's been no reports of chest pain or shortness of breath  6. Proteus UTI. Intravenous Rocephin 1 g daily x7 days initiated 12/25/2011, change to po keflex  7. Dysphagia. Dysphagia 1 pudding thick liquids.? Recheck MBS this week Continue intravenous fluids for hydration  8. Non-insulin-dependent diabetes mellitus: Hemoglobin A1c 6.9.. Patient currently with sliding scale insulin. Patient was taking Glucophage 500 mg daily prior to admission. Sugars controlled suboptimally at present.  -resume gluchopage at 500mg  qday    LOS (Days) 7 A FACE TO FACE EVALUATION WAS PERFORMED  Jobina Maita E 01/03/2012, 8:30 AM

## 2012-01-03 NOTE — Progress Notes (Signed)
Occupational Therapy Session Note  Patient Details  Name: Brendan Scott MRN: [id number] Date of Birth: 22-Apr-1945  Today's Date: 01/03/2012 Time: 0930-1025 Time Calculation (min): 55 min  Short Term Goals: Week 1:  OT Short Term Goal 1 (Week 1): Pt will be able to sit on EOB with supervision. OT Short Term Goal 2 (Week 1): Pt will demonstrate increased LUE motor control by bathing right arm with min assist. OT Short Term Goal 3 (Week 1): Pt will demonstrate improved motor planning skills by donning shirt with mod assist. OT Short Term Goal 4 (Week 1): Pt will transfer squat pivot to w/c and/or toilet with max assist x 1.   Skilled Therapeutic Interventions/Progress Updates:    Pt seen for ADL retraining with focus on sit <> stand, transfers, attention to LUE and LLE, and initiation, cessation, sequencing, and problem solving with self-care tasks of bathing and dressing.  Pt completed bathing at walk-in shower level with focus on following instructions for sequencing, initiation, and cessation with bathing as pt tends to require verbal and tactile cues to initiate and verbal and tactile cues to terminate a task as he perseverates on tasks.  Pt ambulated out of walk-in shower approx 10 feet to w/c with hand held assist.  Pt required max cues for dressing tasks and max-total physical assistance secondary to perseveration on tasks and decreased ability to sequence in a way that allowed success with dressing.  Pt sitting up in w/c in family room visiting with family, RN aware.  Therapy Documentation Precautions:  Precautions Precautions: Fall Precaution Comments: Very limited L knee flexion ROM  Restrictions Weight Bearing Restrictions: No Pain:  Pt with no c/o pain this session.  See FIM for current functional status  Therapy/Group: Individual Therapy  Leonette Monarch 01/03/2012, 12:15 PM

## 2012-01-03 NOTE — Patient Care Conference (Signed)
Inpatient RehabilitationTeam Conference Note Date: 01/03/2012   Time: 10:50 am    Patient Name: Brendan Scott      Medical Record Number: [id number]  Date of Birth: 09-20-1945 Sex: Male         Room/Bed: 4034/4034-01 Payor Info: Payor: MEDICARE  Plan: MEDICARE PART A AND B  Product Type: *No Product type*     Admitting Diagnosis: RT CVA/SEIZURES AND BRAIN TUMOR  Admit Date/Time:  12/27/2011  6:26 PM Admission Comments: No comment available   Primary Diagnosis:  CVA (cerebral infarction) Principal Problem: CVA (cerebral infarction)  Patient Active Problem List   Diagnosis Date Noted  . CVA (cerebral infarction) 12/28/2011  . Respiratory distress 12/25/2011  . Aspiration into respiratory tract 12/24/2011  . Embolic stroke involving middle cerebral artery 12/21/2011  . Focal motor seizure 12/16/2011  . Pain, knee 10/18/2010  . S/P total knee replacement 10/18/2010  . Diabetes mellitus   . OSTEOARTHRITIS, KNEE, LEFT 07/12/2009  . TOTAL KNEE FOLLOW-UP 08/13/2007  . JOINT EFFUSION, KNEE 06/17/2007  . OSTEOARTHRITIS, LOWER LEG 12/05/2006  . Pain in Joint, Lower Leg 12/05/2006  . SCIATICA, RIGHT 12/05/2006  . DIABETES 10/31/2006    Expected Discharge Date: Expected Discharge Date: 01/23/12  Team Members Present: Physician: Dr. Claudette Laws Social Worker Present: Dossie Der, LCSW Nurse Present: Laural Roes, RN PT Present: Wanda Plump, PT;Other (comment) Clarisse Gouge Ripa-PT) OT Present: Leonette Monarch, OT SLP Present: Fae Pippin, SLP Other (Discipline and Name): Charolette Child Coordinator     Current Status/Progress Goal Weekly Team Focus  Medical   poor awareness of deficits  improve awareness of deficits  work on bladder progrram   Bowel/Bladder   incontinent bowel/bladder. lbm 12/4  to be continent during the day with timed toileting  toilet pt q2hours during day   Swallow/Nutrition/ Hydration   Dys.1;  pudding-thick liquids   least restrictive p.o. intake    repeat objective assessment 01/04/12   ADL's   mod assist bathing, max assist UB dressing, total assist LB dressing (secondary to perceptual deficits), mod assist transfers, max assist toileting  min assist overall, mod assist toileting  safety with transfers and sit <> stand, ADL retraining with focus on initiation, cessation, sequencing, and problem solving, LUE use in self-care tasks   Mobility   min-mod A overall but total verbal cues for initiation, cessation, sequence, safety and attention to L side  supervision-min A overall  safety with all mobility, initiation and cessation, balance, gait, attention to L   Communication   min assist   supervision   increase initiation and caryover use of compensatory strategies   Safety/Cognition/ Behavioral Observations  mod-max assist   min assist   increase initiation and cessation    Pain   no c/o pain  no c/o pain  monitor   Skin   skin intact with no issues  no new breakdown  monitor      *See Interdisciplinary Assessment and Plan and progress notes for long and short-term goals  Barriers to Discharge: wife works    Possible Resolutions to Barriers:  assess alternate caregivers    Discharge Planning/Teaching Needs:  Home with wife and grandchildren to assist when wife is at work.      Team Discussion:  Pt making good progress, wife here daily to observe.  Poor awareness of deficits, initaition issues.  Repeat MBS-12/5  Revisions to Treatment Plan:  None   Continued Need for Acute Rehabilitation Level of Care: The patient requires daily medical management by  a physician with specialized training in physical medicine and rehabilitation for the following conditions: Daily direction of a multidisciplinary physical rehabilitation program to ensure safe treatment while eliciting the highest outcome that is of practical value to the patient.: Yes Daily medical management of patient stability for increased activity during participation in  an intensive rehabilitation regime.: Yes Daily analysis of laboratory values and/or radiology reports with any subsequent need for medication adjustment of medical intervention for : Neurological problems  Lucy Chris 01/04/2012, 9:38 AM

## 2012-01-03 NOTE — Progress Notes (Signed)
Physical Therapy Session Note  Patient Details  Name: Brendan Scott MRN: [id number] Date of Birth: December 08, 1945  Today's Date: 01/03/2012 Time:  13:03-14:03 ( )   Short Term Goals: Week 1:  PT Short Term Goal 1 (Week 1): Patient will perform bed mobility on flat bed with min A and 50% cues for initiation and sequencing PT Short Term Goal 2 (Week 1): Patient will perform bed <> w/c transfers with min A and 50% cues for initiation and sequencing PT Short Term Goal 3 (Week 1): Patient will perform gait in controlled environment with LRAD x 150' with mod A and 50% cues for safety, sequencing and attention to L environment PT Short Term Goal 4 (Week 1): Patient will perform up and down 4-6 stairs with bilat rails and min A with 50% cues for safety, sequencing  Skilled Therapeutic Interventions/Progress Updates:  Tx focused on WC mobility, gait training with no device, and balance activities with cognitive tasks to focus on attention, initiation and cessation.    Performed w/c mobility, with R hemi technique in controlled environment with mod A and verbal cues for R foot propulsion, and problem solving to prevent hitting wall on L.   Performed gait training 2x100' with L HHA for steadying with focus on scanning L to identify objects on walls. Pt challenged to increase speed as able, navigating around obstacles and people in busy environment.   Performed standing balance tasks to address selective attention, initiation and cessation of activity, and sequencing; ball toss with visitor - challenging pt to catch with bil UEs, identify letter, and eventually name an animal with that letter. Pt needed Max>>Mod cueing for this task with therapist tracing specific letter for pt to focus on it, rather than scanning all letters on ball. Progressed balance challenge to ball toss with LLE on airex foam pad, batting back to visitor with cues for increased power of hit.   Performed sequencing and attention tasks  by stepping to colored circles in certain orders with R/L LE on floor, progressing from 1>>3 steps in sequence. Pt needed up to Mod A for balance when crossing midline for steps.  Pt challenged to pick up discs from floor and carry back to chair.   Pt performed multiple sit<>stand transfers from arm chairs with Min A for steadying and cues to control descent.   Pt needed steadying assist to stand for use of urinal x2min without UE assist. Assist for adjusting clothing and holding urinal.   Pt requesting to rest in bed before visitors arrive, needing S only for sit>supine. 3 rails and bed alarm on, all needs within reach.     Therapy Documentation Precautions:  Precautions Precautions: Fall Precaution Comments: Very limited L knee flexion ROM  Restrictions Weight Bearing Restrictions: No    Pain: None    Locomotion : Ambulation Ambulation/Gait Assistance: 3: Mod assist Wheelchair Mobility Distance: 120   See FIM for current functional status  Therapy/Group: Individual Therapy  Clydene Laming, PT, DPT   Eulogio Ditch, Richardson Dopp M 01/03/2012, 1:37 PM

## 2012-01-04 ENCOUNTER — Inpatient Hospital Stay (HOSPITAL_COMMUNITY): Payer: BC Managed Care – PPO | Admitting: Occupational Therapy

## 2012-01-04 ENCOUNTER — Inpatient Hospital Stay (HOSPITAL_COMMUNITY): Payer: BC Managed Care – PPO

## 2012-01-04 ENCOUNTER — Inpatient Hospital Stay (HOSPITAL_COMMUNITY): Payer: BC Managed Care – PPO | Admitting: Speech Pathology

## 2012-01-04 ENCOUNTER — Inpatient Hospital Stay (HOSPITAL_COMMUNITY): Payer: BC Managed Care – PPO | Admitting: Physical Therapy

## 2012-01-04 DIAGNOSIS — I6350 Cerebral infarction due to unspecified occlusion or stenosis of unspecified cerebral artery: Secondary | ICD-10-CM

## 2012-01-04 DIAGNOSIS — T17908A Unspecified foreign body in respiratory tract, part unspecified causing other injury, initial encounter: Secondary | ICD-10-CM

## 2012-01-04 LAB — GLUCOSE, CAPILLARY
Glucose-Capillary: 149 mg/dL — ABNORMAL HIGH (ref 70–99)
Glucose-Capillary: 148 mg/dL — ABNORMAL HIGH (ref 70–99)

## 2012-01-04 LAB — PROTIME-INR
INR: 2.47 — ABNORMAL HIGH (ref 0.00–1.49)
Prothrombin Time: 25.6 seconds — ABNORMAL HIGH (ref 11.6–15.2)

## 2012-01-04 MED ORDER — PHENYTOIN 50 MG PO CHEW
100.0000 mg | CHEWABLE_TABLET | Freq: Three times a day (TID) | ORAL | Status: DC
  Administered 2012-01-04 – 2012-01-23 (×57): 100 mg via ORAL
  Filled 2012-01-04 (×64): qty 2

## 2012-01-04 MED ORDER — WARFARIN SODIUM 5 MG PO TABS
5.0000 mg | ORAL_TABLET | Freq: Once | ORAL | Status: DC
  Filled 2012-01-04: qty 1

## 2012-01-04 MED ORDER — PHENYTOIN 50 MG PO CHEW: 200.0000 mg | CHEWABLE_TABLET | Freq: Two times a day (BID) | ORAL | Status: DC

## 2012-01-04 MED ORDER — WARFARIN SODIUM 2.5 MG PO TABS
2.5000 mg | ORAL_TABLET | Freq: Once | ORAL | Status: AC
  Administered 2012-01-04: 2.5 mg via ORAL
  Filled 2012-01-04: qty 1

## 2012-01-04 NOTE — Progress Notes (Signed)
Did not give insulin with lunch.  Patient refuse his lunch, reports he is not hungry.  Will recheck CBG prior supper.

## 2012-01-04 NOTE — Progress Notes (Signed)
Physical Therapy Weekly Progress Note  Patient Details  Name: Brendan Scott MRN: [id number] Date of Birth: 1945/03/20  Today's Date: 01/04/2012 Time: 7829-5621 Time Calculation (min): 65 min  Patient is making good progress towards LTG and has met 4 of 4 short term goals.  Patient is currently min-mod A overall for basic transfers and gait in controlled environment and for stair negotiation.  Patient requires increased assistance for w/c mobility secondary to novel task.  Patient continues to require mod-max verbal and visual cues for attention to task, attention to L, appropriate initiation and cessation of activities, problem solving, sequencing and safety.   Patient continues to demonstrate the following deficits: L sided weakness, impaired coordination, sequencing, cognitive impairments, dynamic standing balance, gait and therefore will continue to benefit from skilled PT intervention to enhance overall performance with balance, ability to compensate for deficits, functional use of  left upper extremity and left lower extremity, attention, awareness and coordination.  Patient progressing toward long term goals..  Continue plan of care.  PT Short Term Goals Week 1:  PT Short Term Goal 1 (Week 1): Patient will perform bed mobility on flat bed with min A and 50% cues for initiation and sequencing PT Short Term Goal 1 - Progress (Week 1): Met PT Short Term Goal 2 (Week 1): Patient will perform bed <> w/c transfers with min A and 50% cues for initiation and sequencing PT Short Term Goal 2 - Progress (Week 1): Met PT Short Term Goal 3 (Week 1): Patient will perform gait in controlled environment with LRAD x 150' with mod A and 50% cues for safety, sequencing and attention to L environment PT Short Term Goal 3 - Progress (Week 1): Met PT Short Term Goal 4 (Week 1): Patient will perform up and down 4-6 stairs with bilat rails and min A with 50% cues for safety, sequencing PT Short Term Goal 4 -  Progress (Week 1): Met Week 2:  PT Short Term Goal 1 (Week 2): Will perform all transfers and gait with min A and 25% cues for initiation, cessaition, sequencing  Skilled Therapeutic Interventions/Progress Updates:   Patient very motivated to get started today.  Wife present at beginning of session and asked about whether or not patient is expected to be able to negotiate stairs or if she needs to build a ramp.  Discussed with her that patient does have a stair goal.  Patient performed supine > sit with bed rail and supervision-min A with one verbal cue to initiate.  Performed sit > stand from bed with mod A secondary to L knee tightness preventing knee flexion.  Wife states that it was tight PTA but it has worsened since admission to hospital.  Performed gait in controlled environment with R HHA x 150 with min A with verbal cues for initiation, changes in direction/guidance and for safety around obstacles.  Began with seated knee stretches with patient's foot against wall and performing anterior scoots with hold to increase knee flexion and ankle DF ROM for transfers.  Performed sit <> stand sequencing, initiation training on hi-lo mat beginning with tall height and progressively lowering mat after 5 reps of sit <> stand with cognitive goal of patient counting 5 reps of sit <> stand and stopping after 5th repetition; required mod >>progressing to min verbal cues for initiation and min A with manual facilitation for anterior translation of femur and tibia on RLE to bring COG over BOS without UE support.  Performed stair training up and  down 5 stairs x 2 reps with bilat UE support beginning with mod A and total verbal and visual cues for safe sequence; final repetition patient able to recall safe sequence without verbal cues and able to initiate and cease with min verbal cues.  Performed gait assessment and training with RW for bilat UE support and to assist with balance x 50' +150' with min-mod A overall with  verbal and visual cues for safe use of RW, obstacle negotiation, and attention to L environment and for changing direction; patient requires multiple verbal cues to cease straight navigation and turn to L or R.   Therapy Documentation Precautions:  Precautions Precautions: Fall Precaution Comments: Very limited L knee flexion ROM  Restrictions Weight Bearing Restrictions: No Vital Signs: Therapy Vitals Pulse Rate: 40  Resp: 19  BP: 144/67 mmHg Patient Position, if appropriate: Lying Oxygen Therapy SpO2: 100 % O2 Device: None (Room air) Pain: Pain Assessment Pain Assessment: No/denies pain Pain Score: 0-No pain Locomotion : Ambulation Ambulation/Gait Assistance: 4: Min assist   See FIM for current functional status  Therapy/Group: Individual Therapy  Edman Circle Faucette 01/04/2012, 3:01 PM

## 2012-01-04 NOTE — Progress Notes (Signed)
Occupational Therapy Session Note  Patient Details  Name: ADAR RASE MRN: [id number] Date of Birth: Dec 12, 1945  Today's Date: 01/04/2012 Time: 1510-1605 Time Calculation (min): 55 min   Skilled Therapeutic Interventions/Progress Updates:    Micah Flesher to therapy gym worked on functional reach using ROM arc.  Pt able to perform shoulder and arm movements to reach rings but unable to grasp and release them.  Needed max facilitation for holding and releasing rings.  Transitioned to using Bioness e-stim to help facilitate finger flexion and extension.  Noted pt with decreased ability to modulate and control UE movements.  At times when asked to relax his arm he would tense up and flex his arm.  Worked in open exercise mode to help with digit extension.  Pt needs max instructional cueing to maintain visual and mental attention on the left hand to assist with facilitating digit extension.  At times pt would place the arm down by his side and would need cues to bring it back up to his lap to maintain visual attention.  Progressed to having pt reach forward while e-stim stimulated digit extension but pt with decreased ability to sequence this as his hand opened.  When returned to the room pt was perseverating on signing himself out of the hospital AMA.  Attempted to re-direct pt about his CVA and not being ready to leave, however he continued to perseverate on leaving.  Dr. Wynn Banker made aware.  Therapy Documentation Precautions:  Precautions Precautions: Fall Precaution Comments: Very limited L knee flexion ROM  Restrictions Weight Bearing Restrictions: No  See FIM for current functional status  Therapy/Group: Individual Therapy  Morna Flud OTR/L 01/04/2012, 4:39 PM

## 2012-01-04 NOTE — Progress Notes (Signed)
Occupational Therapy Weekly Progress Note and Treatment Session Note  Patient Details  Name: Brendan Scott MRN: [id number] Date of Birth: 1945-07-12  Today's Date: 01/04/2012 Time: 1100-1200 Time Calculation (min): 60 min  Patient has met 3 of 4 short term goals.  Pt is demonstrating steady progress towards goals.  Pt is demonstrating improved functional transfers, however continues to require mod-max cues for initiation and sequencing.  Attempting to decrease verbal cues in attempt to increase independence with transfers and dressing ?as confused by complex instructions.  Pt is motivated to use LUE in functional tasks with increased attempts at incorporating it with bathing and pulling up pants, however continues to require hand over hand assist secondary to decreased grasp and FMC.  Patient continues to demonstrate the following deficits: decreased LUE grasp and FMC, impaired initiation, sequencing, cessation, problem solving, perceptual deficits, decreased balance, impaired attention to Lt side, impaired sustained attention, decreased carryover and therefore will continue to benefit from skilled OT intervention to enhance overall performance with BADL and Reduce care partner burden.  Patient progressing toward long term goals..  Continue plan of care.  OT Short Term Goals Week 1:  OT Short Term Goal 1 (Week 1): Pt will be able to sit on EOB with supervision. OT Short Term Goal 1 - Progress (Week 1): Met OT Short Term Goal 2 (Week 1): Pt will demonstrate increased LUE motor control by bathing right arm with min assist. OT Short Term Goal 2 - Progress (Week 1): Progressing toward goal OT Short Term Goal 3 (Week 1): Pt will demonstrate improved motor planning skills by donning shirt with mod assist. OT Short Term Goal 3 - Progress (Week 1): Met OT Short Term Goal 4 (Week 1): Pt will transfer squat pivot to w/c and/or toilet with max assist x 1.  OT Short Term Goal 4 - Progress (Week 1):  Met Week 2:  OT Short Term Goal 1 (Week 2): Pt will demonstrate increased LUE motor control by bathing right arm with min assist OT Short Term Goal 2 (Week 2): Pt will demonstrate improved motor planning skills with donning shirt with mod assist 3/4 times OT Short Term Goal 3 (Week 2): Pt will demonstrate improved motor planning skills with completing LB dressing with mod assist OT Short Term Goal 4 (Week 2): Pt will complete toilet transfer with mod assist 3/4 times OT Short Term Goal 5 (Week 2): Pt will complete toileting with mod assist (2/3 steps)  Skilled Therapeutic Interventions/Progress Updates:    Pt seen for ADL retraining at sink with focus on sit <> stand, dynamic standing balance, and initiation, cessation, and sequencing with self-care tasks of bathing and dressing.  Pt completed bathing at sink with verbal cues for attention to Lt side for bathing, hand over hand assist to incorporate Lt hand into bathing with assist secondary to decreased grasp and impaired sustained attention to Lt hand.  Pt requires increased time for sit <> stand secondary to decreased knee flexion.  Pt demonstrated static standing with close supervision with UE support while assistance provided to complete bathing of buttocks.  Pt continues to have increased perceptual deficits with dressing tasks, requiring increased physical assistance and verbal cues.  However pt able to complete UB dressing for first time this session with mod assist (pulling shirt over head and down over torso). Pt requires cues to terminate tasks and he tends to perseverate on task, required removal of toothbrush this session after oral care exceeded 5 mins.  Therapy Documentation Precautions:  Precautions Precautions: Fall Precaution Comments: Very limited L knee flexion ROM  Restrictions Weight Bearing Restrictions: No Pain: Pain Assessment Pain Assessment: No/denies pain Pain Score: 0-No pain ADL: ADL Grooming: Minimal  assistance Where Assessed-Grooming: Sitting at sink Upper Body Bathing: Moderate assistance Where Assessed-Upper Body Bathing: Sitting at sink Lower Body Bathing: Moderate assistance Where Assessed-Lower Body Bathing: Sitting at sink;Standing at sink Upper Body Dressing: Moderate assistance Where Assessed-Upper Body Dressing: Sitting at sink Lower Body Dressing: Dependent Where Assessed-Lower Body Dressing: Sitting at sink;Standing at sink Toileting: Maximal assistance Where Assessed-Toileting: Teacher, adult education: Moderate assistance Toilet Transfer Method: Stand pivot Toilet Transfer Equipment: Acupuncturist: Moderate assistance Film/video editor Method: Designer, industrial/product: Shower seat with back  See FIM for current functional status  Therapy/Group: Individual Therapy  Leonette Monarch 01/04/2012, 12:18 PM

## 2012-01-04 NOTE — Procedures (Signed)
Objective Swallowing Evaluation: Modified Barium Swallowing Study  Patient Details  Name: Brendan Scott MRN: [id number] Date of Birth: 15-Apr-1945  Today's Date: 01/04/2012 Time: 0940-1005 Time Calculation (min): 25 min  Past Medical History:  Past Medical History  Diagnosis Date  . Arthritis   . Diabetes mellitus   . Atrial fibrillation    Past Surgical History:  Past Surgical History  Procedure Date  . Total knee arthroplasty     Bilateral    Recommendation/Prognosis  Clinical Impression Dysphagia Diagnosis: Mild oral phase dysphagia;Mild pharyngeal phase dysphagia Clinical impression: Mr. Faircloth demonstrated improved swallow function as compared to his previous objective assessment.  He continues to demonstrate a mild oral, pharyngeal based dysphagia characterized by decreased oral strength and sensation, which results in prolonged mastication of solid textures, premature spillage of all consistencies and pharyngeal residue.  Despite these impairments paient effective protected his airway throughout study and as a resutl it is recommended that he initiate a Dys.3 texture, thin liquid diet with full supervision to ensure carryover of 2 swllows with each bite/sip and intermittent cough.   Swallow Evaluation Recommendations Diet Recommendations: Dysphagia 3 (Mechanical Soft);Thin liquid Liquid Administration via: Cup Medication Administration: Whole meds with puree Supervision: Full supervision/cueing for compensatory strategies;Patient able to self feed Compensations: Slow rate;Small sips/bites;Multiple dry swallows after each bite/sip;Hard cough after swallow Postural Changes and/or Swallow Maneuvers: Out of bed for meals Oral Care Recommendations: Oral care BID Follow up Recommendations: Home health SLP;Outpatient SLP Prognosis Prognosis for Safe Diet Advancement: Good Barriers to Reach Goals: Cognitive deficits Barriers/Prognosis Comment: level of alertness does vary at  tiems Individuals Consulted Consulted and Agree with Results and Recommendations: Patient;Family member/caregiver Family Member Consulted: spouse  General:  Date of Onset: 12/15/11 HPI: 66 y/o found in driveway unresponsive by family and called EMS. Patient admitted to [facility] ED with left facial droop, right gaze preference, unable to cross midline, dysarthria and left hemianopsia, CT showed evidence of large mass lesion involving the left frontotemporal region as well as possible increased density . Mass lesion thought to be meningioma. MRI L Large acute non hemorrhagic right hemispheric infarct involving portions of the right frontal lobe, right temporal lobe, right opercular and sub insular region extending to the border with right parietal lobe. Pt has developed cytotoxic edema. Has had constant partial seizure activity. Pt has had some difficutly managing secretions with NG tube in place. Alertness has been impacted by meds. MBSS completed 12/25/11 with recommendation for Dys.1 textures and pudding-thick liquids.  Patient admitted to [facility] 01/26/12 and has been participating in therapies.  Bedside trials have elicited intermittent throat clears and cough and as a reult SLP was unable to r/o penetraion/aspiration, therefore and objective measure was needed prior to upgrade.  Type of Study: Modified Barium Swallowing Study Reason for Referral: Objectively evaluate swallowing function Previous Swallow Assessment: MBSS 12/25/11 Diet Prior to this Study: Dysphagia 1 (puree);Pudding-thick liquids Temperature Spikes Noted: No Respiratory Status: Room air History of Recent Intubation: No Behavior/Cognition: Alert;Cooperative;Pleasant mood;Requires cueing Oral Cavity - Dentition: Dentures, top;Dentures, bottom Oral Motor / Sensory Function: Impaired motor;Impaired sensory (mild weakness and sensation deficits) Self-Feeding Abilities: Able to feed self;Other (Comment) (requires cues) Patient Positioning:  Upright in chair Baseline Vocal Quality: Clear Volitional Cough: Strong Volitional Swallow: Able to elicit Anatomy: Within functional limits Pharyngeal Secretions: Not observed secondary MBS  Reason for Referral:  Objectively evaluate swallowing function   Oral Phase Oral Preparation/Oral Phase Oral Phase: Impaired Oral - Solids Oral - Regular: Impaired mastication;Reduced posterior  propulsion Oral Phase - Comment Oral Phase - Comment: Slightly decreased lingual strength results in decreaed anterior to posterior movement of all consistencies which patietn is able to compensate for with occassional cues to perform 2 swallows.  Pharyngeal Phase  Pharyngeal Phase Pharyngeal Phase: Impaired Pharyngeal - Honey Pharyngeal - Honey Teaspoon: Not tested Pharyngeal - Nectar Pharyngeal - Nectar Teaspoon: Not tested Pharyngeal - Nectar Cup: Within functional limits;Premature spillage to pyriform sinuses;Pharyngeal residue - valleculae Penetration/Aspiration details (nectar cup): Material does not enter airway Pharyngeal - Thin Pharyngeal - Thin Cup: Within functional limits;Premature spillage to pyriform sinuses;Pharyngeal residue - valleculae Penetration/Aspiration details (thin cup): Material does not enter airway Pharyngeal - Thin Straw: Within functional limits;Premature spillage to pyriform sinuses;Pharyngeal residue - valleculae Penetration/Aspiration details (thin straw): Material does not enter airway Pharyngeal - Solids Pharyngeal - Puree: Within functional limits Penetration/Aspiration details (puree): Material does not enter airway Pharyngeal - Mechanical Soft: Within functional limits;Premature spillage to valleculae;Pharyngeal residue - valleculae;Pharyngeal residue - pyriform sinuses;Compensatory strategies attempted (Comment) (mod amount of residue cleared with cues second swallow) Pharyngeal - Pill: Not tested Cervical Esophageal Phase  Cervical Esophageal Phase Cervical  Esophageal Phase:  (Unable to complete esophageal sweep due to pts size)  Fae Pippin, M.A., CCC-SLP [phone]  Lexandra Rettke 01/04/2012, 10:33 AM

## 2012-01-04 NOTE — Progress Notes (Signed)
ANTICOAGULATION CONSULT NOTE - Follow Up Consult  Pharmacy Consult:  Coumadin Indication: atrial fibrillation  No Known Allergies  Patient Measurements: Ht: 66", Wt: 95.5 Kg  Vital Signs: Temp: 98.1 F (36.7 C) (12/05 0518) Temp src: Oral (12/05 0518) BP: 114/74 mmHg (12/05 0518) Pulse Rate: 71  (12/05 0518)  Labs:  Basename 01/04/12 0600 01/03/12 0620 01/02/12 0600  HGB -- -- --  HCT -- -- --  PLT -- -- --  APTT -- -- --  LABPROT 25.6* 24.2* 27.6*  INR 2.47* 2.29* 2.73*  HEPARINUNFRC -- -- --  CREATININE -- -- --  CKTOTAL -- -- --  CKMB -- -- --  TROPONINI -- -- --    The CrCl is unknown because both a height and weight (above a minimum accepted value) are required for this calculation.     Assessment: 66 y/o male patient with a history of Afib who was on ASA 81 mg PTA and admitted with a stroke, presenting outside of window for TPA.  He continues on Coumadin therapy and INR is currently therapeutic.  No bleeding reported.  Noted Dilantin therapy could increase the effect of Coumadin.   Goal of Therapy:  INR 2-3    Plan:  - Coumadin 2.5mg  PO today - Daily PT / INR for now    Keira Bohlin D. Laney Potash, PharmD, BCPS Pager:  [phone] 01/04/2012, 11:07 AM

## 2012-01-04 NOTE — Progress Notes (Signed)
Patient ID: Brendan Scott, male   DOB: 1945-09-22, 66 y.o.   MRN: [id number] Subjective/Complaints:  66 y.o. right-handed male with history of atrial fibrillation on aspirin therapy prior to admission as well as diabetes mellitus. Admitted 12/15/2011 after being found in his driveway by family unresponsive. EMS arrived and noted patient to be slightly confused with left facial droop and dysarthria. MRI of the brain showed a large right middle cerebral artery distribution nonhemorrhagic infarct as well as incidental findings of large left sphenoid wing mass consistent with meningioma. He was not considered a candidate for intravenous thrombolytic therapy with TPA. MRA of the head showed high-grade stenosis proximal basilar artery. Echocardiogram with ejection fraction of 60% and no wall motion abnormalities without emboli. Cerebral angiogram showed no occlusions or stenosis. There was reported seizure 12/16/2011 and patient has been loaded with Keppra /Dilantin as well as vimpat. EEG did not show definite epileptiform discharges. Throughout his hospital course there as been recurrent seizures and has since resolved as of 12/23/2011 Neurology services consulted presently maintained on aspirin therapy as well as subcutaneous Lovenox for DVT prophylaxis. On 11/25 the EKG consistent with atrial fibrillation with cardiac enzymes negative. Coumadin was initiated 12/25/2011 and aspirin was discontinued. He would remain on Lovenox for bridging until INR greater than 2.00. Speech therapy followup for dysphagia and diet has slowly been advanced to a dysphagia 1 pudding thick liquid diet with latest swallow study 12/25/2011. He continued with IV fluids for hydration secondary to pudding thick liquids.  Objective: No c/o no cough or SOB, no seizures Review of Systems  Respiratory: Negative for cough and shortness of breath.   Neurological: Positive for focal weakness. Negative for seizures.  All other systems reviewed and  are negative.   Vital Signs: Blood pressure 114/74, pulse 71, temperature 98.1 F (36.7 C), temperature source Oral, resp. rate 17, SpO2 99.00%. No results found. Results for orders placed during the hospital encounter of 12/27/11 (from the past 72 hour(s))  GLUCOSE, CAPILLARY     Status: Abnormal   Collection Time   01/01/12 11:27 AM      Component Value Range Comment   Glucose-Capillary 185 (*) 70 - 99 mg/dL    Comment 1 Notify RN     GLUCOSE, CAPILLARY     Status: Normal   Collection Time   01/01/12  4:47 PM      Component Value Range Comment   Glucose-Capillary 86  70 - 99 mg/dL   GLUCOSE, CAPILLARY     Status: Abnormal   Collection Time   01/01/12  8:58 PM      Component Value Range Comment   Glucose-Capillary 177 (*) 70 - 99 mg/dL   PROTIME-INR     Status: Abnormal   Collection Time   01/02/12  6:00 AM      Component Value Range Comment   Prothrombin Time 27.6 (*) 11.6 - 15.2 seconds    INR 2.73 (*) 0.00 - 1.49   GLUCOSE, CAPILLARY     Status: Abnormal   Collection Time   01/02/12  7:12 AM      Component Value Range Comment   Glucose-Capillary 128 (*) 70 - 99 mg/dL    Comment 1 Notify RN     PHENYTOIN LEVEL, TOTAL     Status: Abnormal   Collection Time   01/02/12  8:11 AM      Component Value Range Comment   Phenytoin Lvl 8.9 (*) 10.0 - 20.0 ug/mL   GLUCOSE, CAPILLARY  Status: Abnormal   Collection Time   01/02/12 11:16 AM      Component Value Range Comment   Glucose-Capillary 192 (*) 70 - 99 mg/dL    Comment 1 Notify RN     GLUCOSE, CAPILLARY     Status: Abnormal   Collection Time   01/02/12  4:39 PM      Component Value Range Comment   Glucose-Capillary 124 (*) 70 - 99 mg/dL    Comment 1 Notify RN     GLUCOSE, CAPILLARY     Status: Abnormal   Collection Time   01/02/12  9:19 PM      Component Value Range Comment   Glucose-Capillary 162 (*) 70 - 99 mg/dL   GLUCOSE, CAPILLARY     Status: Abnormal   Collection Time   01/02/12 11:10 PM      Component Value  Range Comment   Glucose-Capillary 155 (*) 70 - 99 mg/dL   PROTIME-INR     Status: Abnormal   Collection Time   01/03/12  6:20 AM      Component Value Range Comment   Prothrombin Time 24.2 (*) 11.6 - 15.2 seconds    INR 2.29 (*) 0.00 - 1.49   GLUCOSE, CAPILLARY     Status: Abnormal   Collection Time   01/03/12  7:24 AM      Component Value Range Comment   Glucose-Capillary 125 (*) 70 - 99 mg/dL    Comment 1 Notify RN     GLUCOSE, CAPILLARY     Status: Abnormal   Collection Time   01/03/12 11:57 AM      Component Value Range Comment   Glucose-Capillary 128 (*) 70 - 99 mg/dL    Comment 1 Notify RN     GLUCOSE, CAPILLARY     Status: Abnormal   Collection Time   01/03/12  3:36 PM      Component Value Range Comment   Glucose-Capillary 125 (*) 70 - 99 mg/dL   GLUCOSE, CAPILLARY     Status: Abnormal   Collection Time   01/03/12  8:51 PM      Component Value Range Comment   Glucose-Capillary 138 (*) 70 - 99 mg/dL    Comment 1 Notify RN     PROTIME-INR     Status: Abnormal   Collection Time   01/04/12  6:00 AM      Component Value Range Comment   Prothrombin Time 25.6 (*) 11.6 - 15.2 seconds    INR 2.47 (*) 0.00 - 1.49   GLUCOSE, CAPILLARY     Status: Abnormal   Collection Time   01/04/12  7:21 AM      Component Value Range Comment   Glucose-Capillary 143 (*) 70 - 99 mg/dL    Comment 1 Notify RN        HEENT: L lower facial droop Cardio: RRR Resp: Wheezes and Clear with cough GI: BS positive Extremity:  Pulses positive and No Edema Skin:   Intact Neuro: Lethargic, Cranial Nerve Abnormalities L central VII, Normal Sensory, Abnormal Motor 0/5 L hand , 3-/5 Bi.tri,delt,HF,KE,ankle DF/PF, Dysarthric, Inattention and Other Left neglect no field cut Musc/Skel:  Normal and Other no shoulder pain no ataxia   Assessment/Plan: 1. Functional deficits secondary to embolic R MCA infarct with L HP which require 3+ hours per day of interdisciplinary therapy in a comprehensive inpatient rehab  setting. Physiatrist is providing close team supervision and 24 hour management of active medical problems listed below. Physiatrist and rehab  team continue to assess barriers to discharge/monitor patient progress toward functional and medical goals. FIM: FIM - Bathing Bathing Steps Patient Completed: Chest;Left Arm;Abdomen;Front perineal area;Right upper leg;Left upper leg;Buttocks Bathing: 3: Mod-Patient completes 5-7 54f 10 parts or 50-74%  FIM - Upper Body Dressing/Undressing Upper body dressing/undressing steps patient completed: Put head through opening of pull over shirt/dress Upper body dressing/undressing: 2: Max-Patient completed 25-49% of tasks FIM - Lower Body Dressing/Undressing Lower body dressing/undressing steps patient completed: Pull pants up/down Lower body dressing/undressing: 1: Total-Patient completed less than 25% of tasks  FIM - Toileting Toileting steps completed by patient: Adjust clothing after toileting Toileting Assistive Devices: Grab bar or rail for support Toileting: 2: Max-Patient completed 1 of 3 steps  FIM - Diplomatic Services operational officer Devices: Grab bars;Elevated toilet seat Toilet Transfers: 3-To toilet/BSC: Mod A (lift or lower assist);3-From toilet/BSC: Mod A (lift or lower assist)  FIM - Bed/Chair Transfer Bed/Chair Transfer Assistive Devices: Arm rests Bed/Chair Transfer: 5: Sit > Supine: Supervision (verbal cues/safety issues);4: Bed > Chair or W/C: Min A (steadying Pt. > 75%);4: Chair or W/C > Bed: Min A (steadying Pt. > 75%)  FIM - Locomotion: Wheelchair Distance: 120 Locomotion: Wheelchair: 2: Travels 50 - 149 ft with moderate assistance (Pt: 50 - 74%) FIM - Locomotion: Ambulation Locomotion: Ambulation Assistive Devices: Other (comment) (L HHA) Ambulation/Gait Assistance: 3: Mod assist Locomotion: Ambulation: 2: Travels 50 - 149 ft with moderate assistance (Pt: 50 - 74%)  Comprehension Comprehension Mode:  Auditory Comprehension: 3-Understands basic 50 - 74% of the time/requires cueing 25 - 50%  of the time  Expression Expression Mode: Verbal Expression: 3-Expresses basic 50 - 74% of the time/requires cueing 25 - 50% of the time. Needs to repeat parts of sentences.  Social Interaction Social Interaction: 3-Interacts appropriately 50 - 74% of the time - May be physically or verbally inappropriate.  Problem Solving Problem Solving: 2-Solves basic 25 - 49% of the time - needs direction more than half the time to initiate, plan or complete simple activities  Memory Memory: 2-Recognizes or recalls 25 - 49% of the time/requires cueing 51 - 75% of the time  Medical Problem List and Plan:  1. 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 DVT Prophylaxis/Anticoagulation: Chronic Coumadin therapy with INR 1.77 12/27/2011. Continue Lovenox 40 mg daily  Until INR 2.00. Monitor platelet counts any signs of bleeding  3. Neuropsych: This patient is not capable of making decisions on his/her own behalf.  4. Recurrent seizures. EEG was negative. Continue Klonopin 0.5 mg each bedtime, vimpat 300mg  bid,keppra 2500mg  Q12hour, Dilantin 100 mg 3 times a day. Monitor closely for a seizure activity  5. Hypertension/atrial fibrillation of new onset. Lisinopril 5 mg daily. Cardiac rate is controlled. There's been no reports of chest pain or shortness of breath  6. Proteus UTI. Intravenous Rocephin 1 g daily x7 days initiated 12/25/2011, change to po keflex  7. Dysphagia. Dysphagia 1 pudding thick liquids.? Recheck MBS this week Continue intravenous fluids for hydration  8. Non-insulin-dependent diabetes mellitus: Hemoglobin A1c 6.9.. Patient currently with sliding scale insulin. Patient was taking Glucophage 500 mg daily prior to admission. Sugars controlled  at present.  Cont gluchopage at 500mg  qday    LOS (Days) 8 A FACE TO FACE EVALUATION WAS PERFORMED  Devone Tousley  E 01/04/2012, 8:03 AM

## 2012-01-05 ENCOUNTER — Inpatient Hospital Stay (HOSPITAL_COMMUNITY): Payer: BC Managed Care – PPO | Admitting: Speech Pathology

## 2012-01-05 ENCOUNTER — Inpatient Hospital Stay (HOSPITAL_COMMUNITY): Payer: BC Managed Care – PPO | Admitting: Occupational Therapy

## 2012-01-05 ENCOUNTER — Inpatient Hospital Stay (HOSPITAL_COMMUNITY): Payer: BC Managed Care – PPO | Admitting: Physical Therapy

## 2012-01-05 LAB — GLUCOSE, CAPILLARY
Glucose-Capillary: 117 mg/dL — ABNORMAL HIGH (ref 70–99)
Glucose-Capillary: 142 mg/dL — ABNORMAL HIGH (ref 70–99)

## 2012-01-05 LAB — PROTIME-INR: INR: 3.31 — ABNORMAL HIGH (ref 0.00–1.49)

## 2012-01-05 NOTE — Progress Notes (Signed)
Occupational Therapy Session Note  Patient Details  Name: Brendan Scott MRN: [id number] Date of Birth: 11-23-1945  Today's Date: 01/05/2012 Time: 4696-2952 Time Calculation (min): 55 min  Short Term Goals: Week 2:  OT Short Term Goal 1 (Week 2): Pt will demonstrate increased LUE motor control by bathing right arm with min assist OT Short Term Goal 2 (Week 2): Pt will demonstrate improved motor planning skills with donning shirt with mod assist 3/4 times OT Short Term Goal 3 (Week 2): Pt will demonstrate improved motor planning skills with completing LB dressing with mod assist OT Short Term Goal 4 (Week 2): Pt will complete toilet transfer with mod assist 3/4 times OT Short Term Goal 5 (Week 2): Pt will complete toileting with mod assist (2/3 steps)  Skilled Therapeutic Interventions/Progress Updates:    Pt seen for ADL retraining at sink with focus on sit <> stand, dynamic standing balance, hemi-technique with bathing and dressing, and increased participation in dressing tasks.  Pt in bed upon arrival and willing to get OOB to participate in therapy.  Engaged in bathing at sink to further facilitate initiation, sequencing, and forced use of LUE with bathing and dressing tasks.  Hand over hand assist at LT hand with bathing of Rt arm, Lt leg, and assisting with grasp to attempt to pull up pants.  Encouraged pt to direct visual attention to Lt hand to increase attention and function.  Pt with increased participation in bathing tasks with fewer cues for bathing of each part with increased sequencing.  Increased participation in dressing with pt donning both pant legs of underwear and pants, requiring hand over hand assist with pulling pants over hips.  This clinician focused on providing simple cues and allowed time for pt to attempt to problem solve, which allowed pt to increase participation.  Therapy Documentation Precautions:  Precautions Precautions: Fall Precaution Comments: Very limited L  knee flexion ROM  Restrictions Weight Bearing Restrictions: No Pain:  Pt with no c/o pain this session  See FIM for current functional status  Therapy/Group: Individual Therapy  Leonette Monarch 01/05/2012, 9:25 AM

## 2012-01-05 NOTE — Progress Notes (Signed)
Physical Therapy Session Note  Patient Details  Name: Brendan Scott MRN: [id number] Date of Birth: April 27, 1945  Today's Date: 01/05/2012 Time: 1330-1430 Time Calculation (min): 60 min  Short Term Goals: Week 2:  PT Short Term Goal 1 (Week 2): Will perform all transfers and gait with min A and 25% cues for initiation, cessaition, sequencing  Skilled Therapeutic Interventions/Progress Updates:   Patient ready to go!  Performed gait with RW in controlled environment and giving patient verbal cues for directions/guidance before beginning to work on memory and initiating changes in direction without cues.  Patient able to perform gait with RW x 150' with min A to maintain LUE on RW with 25% cues to maintain correct course and for pedestrian and obstacle negotiation/safety.  Performed seated knee flexion stretches with prolonged hold in closed chain with anterior scoots and lowering of mat with foot in fixed position.  With knee stretches patient can be as little as supervision for sit > stand.  Performed dynamic standing balance, activity tolerance, attention to L and selective attention, sequencing, initiation, cessation training with R and L ball kicks with therapist giving verbal cues for R vs. L LE and number of kicks with min-mod HHA and 75% cues to maintain attention and sequencing.  Performed stair negotiation review with bilat UE support on rails up and down 5 stairs x 5 reps with patient verbalizing and performing alternating sequence to ascend with min A and step to sequence to descend 50% of the time with max verbal cues to perform safely secondary to limited knee flexion.  Added hand orthosis to RW; continued gait training with hand orthosis x 150' controlled environment back to room with patient using visual/environmental and verbal cues for guidance back to room with min physical assistance but as patient approached room patient required total verbal and visual cues to choose correct room (walked  into empty room, walked into another patient's room) and for redirection and to attend to L environment and recognize his name at correct door.  Patient very fatigued at end of session.     Therapy Documentation Precautions:  Precautions Precautions: Fall Precaution Comments: Very limited L knee flexion ROM  Restrictions Weight Bearing Restrictions: No Vital Signs: Therapy Vitals Temp: 97.5 F (36.4 C) Temp src: Oral Pulse Rate: 83  Resp: 18  BP: 114/79 mmHg Patient Position, if appropriate: Sitting Oxygen Therapy SpO2: 100 % Pain: Pain Assessment Pain Assessment: No/denies pain Locomotion : Ambulation Ambulation/Gait Assistance: 4: Min assist   See FIM for current functional status  Therapy/Group: Individual Therapy  Edman Circle [facility] 01/05/2012, 4:08 PM

## 2012-01-05 NOTE — Progress Notes (Signed)
Patient ID: Brendan Scott, male   DOB: 06/01/1945, 66 y.o.   MRN: [id number] Subjective/Complaints:  66 y.o. right-handed male with history of atrial fibrillation on aspirin therapy prior to admission as well as diabetes mellitus. Admitted 12/15/2011 after being found in his driveway by family unresponsive. EMS arrived and noted patient to be slightly confused with left facial droop and dysarthria. MRI of the brain showed a large right middle cerebral artery distribution nonhemorrhagic infarct as well as incidental findings of large left sphenoid wing mass consistent with meningioma. He was not considered a candidate for intravenous thrombolytic therapy with TPA. MRA of the head showed high-grade stenosis proximal basilar artery. Echocardiogram with ejection fraction of 60% and no wall motion abnormalities without emboli. Cerebral angiogram showed no occlusions or stenosis. There was reported seizure 12/16/2011 and patient has been loaded with Keppra /Dilantin as well as vimpat. EEG did not show definite epileptiform discharges. Throughout his hospital course there as been recurrent seizures and has since resolved as of 12/23/2011 Neurology services consulted presently maintained on aspirin therapy as well as subcutaneous Lovenox for DVT prophylaxis. On 11/25 the EKG consistent with atrial fibrillation with cardiac enzymes negative. Coumadin was initiated 12/25/2011 and aspirin was discontinued. He would remain on Lovenox for bridging until INR greater than 2.00. Speech therapy followup for dysphagia and diet has slowly been advanced to a dysphagia 1 pudding thick liquid diet with latest swallow study 12/25/2011. He continued with IV fluids for hydration secondary to pudding thick liquids.  Objective: No c/o no cough or SOB, no seizures Diet upgraded after MBS Review of Systems  Respiratory: Negative for cough and shortness of breath.   Neurological: Positive for focal weakness. Negative for seizures.  All  other systems reviewed and are negative.   Vital Signs: Blood pressure 152/91, pulse 78, temperature 97.5 F (36.4 C), temperature source Oral, resp. rate 19, SpO2 99.00%. Dg Swallowing Func-speech Pathology  01/04/2012  Ophelia Shoulder, CCC-SLP     01/04/2012 10:46 AM Objective Swallowing Evaluation: Modified Barium Swallowing Study   Patient Details  Name: Brendan Scott MRN: [id number] Date of Birth: 22-Feb-1945  Today's Date: 01/04/2012 Time: 0940-1005 Time Calculation (min): 25 min  Past Medical History:  Past Medical History  Diagnosis Date  . Arthritis   . Diabetes mellitus   . Atrial fibrillation    Past Surgical History:  Past Surgical History  Procedure Date  . Total knee arthroplasty     Bilateral    Recommendation/Prognosis  Clinical Impression Dysphagia Diagnosis: Mild oral phase dysphagia;Mild pharyngeal  phase dysphagia Clinical impression: [name] demonstrated improved swallow  function as compared to his previous objective assessment.  He  continues to demonstrate a mild oral, pharyngeal based dysphagia  characterized by decreased oral strength and sensation, which  results in prolonged mastication of solid textures, premature  spillage of all consistencies and pharyngeal residue.  Despite  these impairments paient effective protected his airway  throughout study and as a resutl it is recommended that he  initiate a Dys.3 texture, thin liquid diet with full supervision  to ensure carryover of 2 swllows with each bite/sip and  intermittent cough.   Swallow Evaluation Recommendations Diet Recommendations: Dysphagia 3 (Mechanical Soft);Thin liquid Liquid Administration via: Cup Medication Administration: Whole meds with puree Supervision: Full supervision/cueing for compensatory  strategies;Patient able to self feed Compensations: Slow rate;Small sips/bites;Multiple dry swallows  after each bite/sip;Hard cough after swallow Postural Changes and/or Swallow Maneuvers: Out of bed for meals Oral Care  Recommendations: Oral care BID Follow up Recommendations: Home health SLP;Outpatient SLP Prognosis Prognosis for Safe Diet Advancement: Good Barriers to Reach Goals: Cognitive deficits Barriers/Prognosis Comment: level of alertness does vary at tiems Individuals Consulted Consulted and Agree with Results and Recommendations:  Patient;Family member/caregiver Family Member Consulted: spouse  General:  Date of Onset: 12/15/11 HPI: 66 y/o found in driveway unresponsive by family and called  EMS. Patient admitted to [facility] ED with left facial droop, right gaze  preference, unable to cross midline, dysarthria and left  hemianopsia, CT showed evidence of large mass lesion involving  the left frontotemporal region as well as possible increased  density . Mass lesion thought to be meningioma. MRI L Large acute  non hemorrhagic right hemispheric infarct involving portions of  the right frontal lobe, right temporal lobe, right opercular and  sub insular region extending to the border with right parietal  lobe. Pt has developed cytotoxic edema. Has had constant partial  seizure activity. Pt has had some difficutly managing secretions  with NG tube in place. Alertness has been impacted by meds. MBSS  completed 12/25/11 with recommendation for Dys.1 textures and  pudding-thick liquids.  Patient admitted to [facility] 01/26/12 and has  been participating in therapies.  Bedside trials have elicited  intermittent throat clears and cough and as a reult SLP was  unable to r/o penetraion/aspiration, therefore and objective  measure was needed prior to upgrade.  Type of Study: Modified Barium Swallowing Study Reason for Referral: Objectively evaluate swallowing function Previous Swallow Assessment: MBSS 12/25/11 Diet Prior to this Study: Dysphagia 1 (puree);Pudding-thick  liquids Temperature Spikes Noted: No Respiratory Status: Room air History of Recent Intubation: No Behavior/Cognition: Alert;Cooperative;Pleasant mood;Requires  cueing Oral  Cavity - Dentition: Dentures, top;Dentures, bottom Oral Motor / Sensory Function: Impaired motor;Impaired sensory  (mild weakness and sensation deficits) Self-Feeding Abilities: Able to feed self;Other (Comment)  (requires cues) Patient Positioning: Upright in chair Baseline Vocal Quality: Clear Volitional Cough: Strong Volitional Swallow: Able to elicit Anatomy: Within functional limits Pharyngeal Secretions: Not observed secondary MBS  Reason for Referral:  Objectively evaluate swallowing function   Oral Phase Oral Preparation/Oral Phase Oral Phase: Impaired Oral - Solids Oral - Regular: Impaired mastication;Reduced posterior propulsion Oral Phase - Comment Oral Phase - Comment: Slightly decreased lingual strength results  in decreaed anterior to posterior movement of all consistencies  which patietn is able to compensate for with occassional cues to  perform 2 swallows.  Pharyngeal Phase  Pharyngeal Phase Pharyngeal Phase: Impaired Pharyngeal - Honey Pharyngeal - Honey Teaspoon: Not tested Pharyngeal - Nectar Pharyngeal - Nectar Teaspoon: Not tested Pharyngeal - Nectar Cup: Within functional limits;Premature  spillage to pyriform sinuses;Pharyngeal residue - valleculae Penetration/Aspiration details (nectar cup): Material does not  enter airway Pharyngeal - Thin Pharyngeal - Thin Cup: Within functional limits;Premature  spillage to pyriform sinuses;Pharyngeal residue - valleculae Penetration/Aspiration details (thin cup): Material does not  enter airway Pharyngeal - Thin Straw: Within functional limits;Premature  spillage to pyriform sinuses;Pharyngeal residue - valleculae Penetration/Aspiration details (thin straw): Material does not  enter airway Pharyngeal - Solids Pharyngeal - Puree: Within functional limits Penetration/Aspiration details (puree): Material does not enter  airway Pharyngeal - Mechanical Soft: Within functional limits;Premature  spillage to valleculae;Pharyngeal residue - valleculae;Pharyngeal   residue - pyriform sinuses;Compensatory strategies attempted  (Comment) (mod amount of residue cleared with cues second  swallow) Pharyngeal - Pill: Not tested Cervical Esophageal Phase  Cervical Esophageal Phase Cervical Esophageal Phase:  (Unable to complete esophageal sweep  due to  pts size)  Charlane Ferretti., CCC-SLP [phone]  BOWIE,MELISSA 01/04/2012, 10:33 AM      Results for orders placed during the hospital encounter of 12/27/11 (from the past 72 hour(s))  PHENYTOIN LEVEL, TOTAL     Status: Abnormal   Collection Time   01/02/12  8:11 AM      Component Value Range Comment   Phenytoin Lvl 8.9 (*) 10.0 - 20.0 ug/mL   GLUCOSE, CAPILLARY     Status: Abnormal   Collection Time   01/02/12 11:16 AM      Component Value Range Comment   Glucose-Capillary 192 (*) 70 - 99 mg/dL    Comment 1 Notify RN     GLUCOSE, CAPILLARY     Status: Abnormal   Collection Time   01/02/12  4:39 PM      Component Value Range Comment   Glucose-Capillary 124 (*) 70 - 99 mg/dL    Comment 1 Notify RN     GLUCOSE, CAPILLARY     Status: Abnormal   Collection Time   01/02/12  9:19 PM      Component Value Range Comment   Glucose-Capillary 162 (*) 70 - 99 mg/dL   GLUCOSE, CAPILLARY     Status: Abnormal   Collection Time   01/02/12 11:10 PM      Component Value Range Comment   Glucose-Capillary 155 (*) 70 - 99 mg/dL   PROTIME-INR     Status: Abnormal   Collection Time   01/03/12  6:20 AM      Component Value Range Comment   Prothrombin Time 24.2 (*) 11.6 - 15.2 seconds    INR 2.29 (*) 0.00 - 1.49   GLUCOSE, CAPILLARY     Status: Abnormal   Collection Time   01/03/12  7:24 AM      Component Value Range Comment   Glucose-Capillary 125 (*) 70 - 99 mg/dL    Comment 1 Notify RN     GLUCOSE, CAPILLARY     Status: Abnormal   Collection Time   01/03/12 11:57 AM      Component Value Range Comment   Glucose-Capillary 128 (*) 70 - 99 mg/dL    Comment 1 Notify RN     GLUCOSE, CAPILLARY     Status: Abnormal    Collection Time   01/03/12  3:36 PM      Component Value Range Comment   Glucose-Capillary 125 (*) 70 - 99 mg/dL   GLUCOSE, CAPILLARY     Status: Abnormal   Collection Time   01/03/12  8:51 PM      Component Value Range Comment   Glucose-Capillary 138 (*) 70 - 99 mg/dL    Comment 1 Notify RN     PROTIME-INR     Status: Abnormal   Collection Time   01/04/12  6:00 AM      Component Value Range Comment   Prothrombin Time 25.6 (*) 11.6 - 15.2 seconds    INR 2.47 (*) 0.00 - 1.49   GLUCOSE, CAPILLARY     Status: Abnormal   Collection Time   01/04/12  7:21 AM      Component Value Range Comment   Glucose-Capillary 143 (*) 70 - 99 mg/dL    Comment 1 Notify RN     GLUCOSE, CAPILLARY     Status: Abnormal   Collection Time   01/04/12 11:31 AM      Component Value Range Comment   Glucose-Capillary 156 (*) 70 - 99 mg/dL   GLUCOSE,  CAPILLARY     Status: Abnormal   Collection Time   01/04/12  5:02 PM      Component Value Range Comment   Glucose-Capillary 149 (*) 70 - 99 mg/dL    Comment 1 Notify RN     GLUCOSE, CAPILLARY     Status: Abnormal   Collection Time   01/04/12  8:59 PM      Component Value Range Comment   Glucose-Capillary 148 (*) 70 - 99 mg/dL    Comment 1 Notify RN     PROTIME-INR     Status: Abnormal   Collection Time   01/05/12  6:30 AM      Component Value Range Comment   Prothrombin Time 31.8 (*) 11.6 - 15.2 seconds    INR 3.31 (*) 0.00 - 1.49   GLUCOSE, CAPILLARY     Status: Abnormal   Collection Time   01/05/12  7:24 AM      Component Value Range Comment   Glucose-Capillary 117 (*) 70 - 99 mg/dL    Comment 1 Notify RN        HEENT: L lower facial droop Cardio: RRR Resp: Wheezes and Clear with cough GI: BS positive Extremity:  Pulses positive and No Edema Skin:   Intact Neuro: Lethargic, Cranial Nerve Abnormalities L central VII, Normal Sensory, Abnormal Motor 0/5 L hand , 3-/5 Bi.tri,delt,HF,KE,ankle DF/PF, Dysarthric, Inattention and Other Left neglect no field  cut Musc/Skel:  Normal and Other no shoulder pain no ataxia   Assessment/Plan: 1. Functional deficits secondary to embolic R MCA infarct with L HP which require 3+ hours per day of interdisciplinary therapy in a comprehensive inpatient rehab setting. Physiatrist is providing close team supervision and 24 hour management of active medical problems listed below. Physiatrist and rehab team continue to assess barriers to discharge/monitor patient progress toward functional and medical goals. FIM: FIM - Bathing Bathing Steps Patient Completed: Chest;Abdomen;Front perineal area;Buttocks;Right upper leg;Left upper leg;Right lower leg (including foot) Bathing: 3: Mod-Patient completes 5-7 28f 10 parts or 50-74%  FIM - Upper Body Dressing/Undressing Upper body dressing/undressing steps patient completed: Put head through opening of pull over shirt/dress;Pull shirt over trunk Upper body dressing/undressing: 3: Mod-Patient completed 50-74% of tasks FIM - Lower Body Dressing/Undressing Lower body dressing/undressing steps patient completed: Pull pants up/down Lower body dressing/undressing: 1: Total-Patient completed less than 25% of tasks  FIM - Toileting Toileting steps completed by patient: Adjust clothing after toileting Toileting Assistive Devices: Grab bar or rail for support Toileting: 2: Max-Patient completed 1 of 3 steps  FIM - Diplomatic Services operational officer Devices: Grab bars;Elevated toilet seat Toilet Transfers: 3-To toilet/BSC: Mod A (lift or lower assist);3-From toilet/BSC: Mod A (lift or lower assist)  FIM - Bed/Chair Transfer Bed/Chair Transfer Assistive Devices: Bed rails;HOB elevated Bed/Chair Transfer: 5: Supine > Sit: Supervision (verbal cues/safety issues);4: Bed > Chair or W/C: Min A (steadying Pt. > 75%);4: Chair or W/C > Bed: Min A (steadying Pt. > 75%)  FIM - Locomotion: Wheelchair Distance: 120 Locomotion: Wheelchair: 0: Activity did not occur FIM -  Locomotion: Ambulation Locomotion: Ambulation Assistive Devices: Walker - Rolling;Other (comment) (HHA) Ambulation/Gait Assistance: 4: Min assist Locomotion: Ambulation: 4: Travels 150 ft or more with minimal assistance (Pt.>75%)  Comprehension Comprehension Mode: Auditory Comprehension: 3-Understands basic 50 - 74% of the time/requires cueing 25 - 50%  of the time  Expression Expression Mode: Verbal Expression: 3-Expresses basic 50 - 74% of the time/requires cueing 25 - 50% of the time. Needs to repeat  parts of sentences.  Social Interaction Social Interaction: 3-Interacts appropriately 50 - 74% of the time - May be physically or verbally inappropriate.  Problem Solving Problem Solving: 2-Solves basic 25 - 49% of the time - needs direction more than half the time to initiate, plan or complete simple activities  Memory Memory: 2-Recognizes or recalls 25 - 49% of the time/requires cueing 51 - 75% of the time  Medical Problem List and Plan:  1. 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 DVT Prophylaxis/Anticoagulation: Chronic Coumadin therapy with INR 1.77 12/27/2011. Continue Lovenox 40 mg daily  Until INR 2.00. Monitor platelet counts any signs of bleeding  3. Neuropsych: This patient is not capable of making decisions on his/her own behalf.  4. Recurrent seizures. EEG was negative. Continue Klonopin 0.5 mg each bedtime, vimpat 300mg  bid,keppra 2500mg  Q12hour, Dilantin 100 mg 3 times a day. Monitor closely for a seizure activity  5. Hypertension/atrial fibrillation of new onset. Lisinopril 5 mg daily. Cardiac rate is controlled. There's been no reports of chest pain or shortness of breath   6. Dysphagia. Dysphagia 3 thin liquids Continue intravenous fluids for hydration  7. Non-insulin-dependent diabetes mellitus: Hemoglobin A1c 6.9.. Patient currently with sliding scale insulin. Patient was taking Glucophage 500 mg daily prior to admission. Sugars  controlled  at present.  Cont glucopage at 500mg  qday    LOS (Days) 9 A FACE TO FACE EVALUATION WAS PERFORMED  KIRSTEINS,ANDREW E 01/05/2012, 7:59 AM

## 2012-01-05 NOTE — Progress Notes (Signed)
Speech Language Pathology Daily Session Note  Patient Details  Name: Brendan Scott MRN: [id number] Date of Birth: 1945/08/10  Today's Date: 01/05/2012 Time: 1130-1145 Time Calculation (min): 15 min  Short Term Goals: Week 1: SLP Short Term Goal 1 (Week 1): Pt will self-monitor and correct left anterior spillage of secretions with Mod verbal and question cues.  SLP Short Term Goal 2 (Week 1): Pt will utilize swallowing compensatory strategies of multiple swallows and small bites with current diet textures with Mod A verbal cues.  SLP Short Term Goal 3 (Week 1): Pt will demonstrate sustained attention to a functional task for 5 minutes with Mod A verbal and question cues for redirection SLP Short Term Goal 4 (Week 1): Pt will attend to left enviornment/body with Mod A tactile and verbal cues.  SLP Short Term Goal 5 (Week 1): Pt will maintain topic of conversation for ~3 turns with Max A verbal and question cues for redirection.  SLP Short Term Goal 6 (Week 1): Pt will identify 1 physical and 1 cognitive deficit with Mod A semantic cues.   Skilled Therapeutic Interventions: Group session, co-treatment with OT; SLP focused on addressing dysphagia goals. Patient consumed Dys.3 textures and thin liquids with mod-max assist to self-monitor and compensate for left pocketing and anterior loss.  Patient also demonstrated intermittent throat clears throughout meal on thin and nectar-thick liquid consistencies.  Considering presentation today and results of MBSS yesterday SLP suspects that penetration of consistencies when patient is fatigued and p.o. should be held until he is more awake.  SLP educated wife, who verbalized understanding of diet down grade to Dys.2 textures and thin liquids when awake and alert.  She demonstrated effective cuing strategies during lunch and is able to provide supervision when appropriate.  Patient's wife also requested to speak with physician assistant regarding decreased arousal  due to medication.  SLP notified PA.      FIM:  Comprehension Comprehension Mode: Auditory Comprehension: 3-Understands basic 50 - 74% of the time/requires cueing 25 - 50%  of the time Expression Expression Mode: Verbal Expression: 3-Expresses basic 50 - 74% of the time/requires cueing 25 - 50% of the time. Needs to repeat parts of sentences. Social Interaction Social Interaction: 3-Interacts appropriately 50 - 74% of the time - May be physically or verbally inappropriate. Problem Solving Problem Solving: 2-Solves basic 25 - 49% of the time - needs direction more than half the time to initiate, plan or complete simple activities Memory Memory: 2-Recognizes or recalls 25 - 49% of the time/requires cueing 51 - 75% of the time FIM - Eating Eating Activity: 4: Helper checks for pocketed food  Pain Pain Assessment Pain Assessment: No/denies pain  Therapy/Group: Group Therapy  Charlane Ferretti., CCC-SLP [phone]  Kent Riendeau 01/05/2012, 1:23 PM

## 2012-01-05 NOTE — Progress Notes (Signed)
ANTICOAGULATION CONSULT NOTE - Follow Up Consult  Pharmacy Consult:  Coumadin Indication: atrial fibrillation  No Known Allergies  Patient Measurements: Ht: 66", Wt: 95.5 Kg  Vital Signs: Temp: 97.5 F (36.4 C) (12/06 0513) Temp src: Oral (12/06 0513) BP: 152/91 mmHg (12/06 0513) Pulse Rate: 78  (12/06 0513)  Labs:  Basename 01/05/12 0630 01/04/12 0600 01/03/12 0620  HGB -- -- --  HCT -- -- --  PLT -- -- --  APTT -- -- --  LABPROT 31.8* 25.6* 24.2*  INR 3.31* 2.47* 2.29*  HEPARINUNFRC -- -- --  CREATININE -- -- --  CKTOTAL -- -- --  CKMB -- -- --  TROPONINI -- -- --    The CrCl is unknown because both a height and weight (above a minimum accepted value) are required for this calculation.     Assessment: 66 y/o male patient with a history of Afib who was on ASA 81 mg PTA and admitted with a stroke, presenting outside of window for TPA.  He continues on Coumadin therapy and INR supratherapeutic.  No bleeding reported.  Noted Dilantin therapy could increase the effect of Coumadin.   Goal of Therapy:  INR 2-3    Plan:  - Hold Coumadin today - Daily PT / INR    Haivyn Oravec D. Laney Potash, PharmD, BCPS Pager:  [phone] 01/05/2012, 9:17 AM

## 2012-01-05 NOTE — Progress Notes (Signed)
Speech Language Pathology Daily Session Note & Weekly Progress Summary  Patient Details  Name: Brendan Scott MRN: [id number] Date of Birth: [birth year]  Today's Date: 01/05/2012 Time: 0930-1015 Time Calculation (min): 45 min  Short Term Goals: Week 1: SLP Short Term Goal 1 (Week 1): Pt will self-monitor and correct left anterior spillage of secretions with Mod verbal and question cues.  SLP Short Term Goal 2 (Week 1): Pt will utilize swallowing compensatory strategies of multiple swallows and small bites with current diet textures with Mod A verbal cues.  SLP Short Term Goal 3 (Week 1): Pt will demonstrate sustained attention to a functional task for 5 minutes with Mod A verbal and question cues for redirection SLP Short Term Goal 4 (Week 1): Pt will attend to left enviornment/body with Mod A tactile and verbal cues.  SLP Short Term Goal 5 (Week 1): Pt will maintain topic of conversation for ~3 turns with Max A verbal and question cues for redirection.  SLP Short Term Goal 6 (Week 1): Pt will identify 1 physical and 1 cognitive deficit with Mod A semantic cues.   Skilled Therapeutic Interventions: Skilled treatment session focused on addressing dysphagia goals. SLP facilitated session with trials of Dys.3 textures and thin liquids via cup with mod-max assist to verbal, visual and tactile cues to monitor left pocketing, left anterior loss, double swallows.  Patient demonstrated increased throat clears and coughs throughout session today, which SLP suspects is a result of increased fatigue as compared to yesterday.  Wife present and reports that this behavior is typical after he receives his morning medications; SLP recommends p.o. intake only when patient is awake and alert.     FIM:  Comprehension Comprehension Mode: Auditory Comprehension: 3-Understands basic 50 - 74% of the time/requires cueing 25 - 50%  of the time Expression Expression Mode: Verbal Expression: 3-Expresses basic 50 - 74% of  the time/requires cueing 25 - 50% of the time. Needs to repeat parts of sentences. Social Interaction Social Interaction: 3-Interacts appropriately 50 - 74% of the time - May be physically or verbally inappropriate. Problem Solving Problem Solving: 2-Solves basic 25 - 49% of the time - needs direction more than half the time to initiate, plan or complete simple activities Memory Memory: 2-Recognizes or recalls 25 - 49% of the time/requires cueing 51 - 75% of the time FIM - Eating Eating Activity: 4: Helper checks for pocketed food  Pain Pain Assessment Pain Assessment: No/denies pain  Therapy/Group: Individual Therapy   Speech Language Pathology Weekly Progress Note  Patient Details  Name: Brendan Scott MRN: [id number] Date of Birth: [birth year]  Today's Date: 01/05/2012  Short Term Goals: Week 1: SLP Short Term Goal 1 (Week 1): Pt will self-monitor and correct left anterior spillage of secretions with Mod verbal and question cues.  SLP Short Term Goal 1 - Progress (Week 1): Met SLP Short Term Goal 2 (Week 1): Pt will utilize swallowing compensatory strategies of multiple swallows and small bites with current diet textures with Mod A verbal cues.  SLP Short Term Goal 2 - Progress (Week 1): Met SLP Short Term Goal 3 (Week 1): Pt will demonstrate sustained attention to a functional task for 5 minutes with Mod A verbal and question cues for redirection SLP Short Term Goal 3 - Progress (Week 1): Met SLP Short Term Goal 4 (Week 1): Pt will attend to left enviornment/body with Mod A tactile and verbal cues.  SLP Short Term Goal 4 - Progress (Week 1): Progressing  toward goal SLP Short Term Goal 5 (Week 1): Pt will maintain topic of conversation for ~3 turns with Max A verbal and question cues for redirection.  SLP Short Term Goal 5 - Progress (Week 1): Progressing toward goal SLP Short Term Goal 6 (Week 1): Pt will identify 1 physical and 1 cognitive deficit with Mod A semantic cues.  SLP  Short Term Goal 6 - Progress (Week 1): Progressing toward goal Week 2: SLP Short Term Goal 1 (Week 2): Patient will self-monitor and correct left anterior loss of textures and liquids during p.o. intake with mod assist verbal and question cues. SLP Short Term Goal 2 (Week 2): Patient will utilize swallowing compensatory strategies while consuming Dys.2 textures and thin liquids with min assist verbal cues.  SLP Short Term Goal 3 (Week 2): Patient will demonstrate selective attention to a functional task for 10 minutes with mod assist verbal and question cues for redirection  SLP Short Term Goal 4 (Week 2): Patient will attend to left enviornment/body with mod assist tactile and verbal cues.  SLP Short Term Goal 5 (Week 2): Patient will maintain topic of conversation for ~3 turns with max assist verbal and question cues for redirection. SLP Short Term Goal 6 (Week 2): Patient will identify 1 physical and 1 cognitive deficit with mod assist semantic cues.   Weekly Progress Updates: Patient met 3 out of 6 short term objectives this reporting period with gains in management of secretions and swallow function as well as ability to sustain attention. Patient progressed from Dys.1 textures diet with pudding -thick liquids to Dys.2 textures and thin liquids with full supervision.  Patient is able to sustain attention when he is awake and alert; however, medications appear to impact arousal and function at times.  Limited time was spent addressing awareness, initiation, topic maintenance and left attention.  As a result, it is recommended that this patient continue to receive skilled SLP services t address above deficits to reduce burden of care upon discharge.   SLP Frequency: 1-2 X/day, 30-60 minutes;5 out of 7 days Estimated Length of Stay: 3 weeks SLP Treatment/Interventions: Cognitive remediation/compensation;Cueing hierarchy;Dysphagia/aspiration precaution training;Environmental controls;Functional  tasks;Internal/external aids;Patient/family education;Therapeutic Activities  Charlane Ferretti., CCC-SLP [phone]  Shawnda Mauney 01/05/2012, 1:17 PM

## 2012-01-05 NOTE — Progress Notes (Signed)
Occupational Therapy Note  Patient Details  Name: Brendan Scott MRN: [id number] Date of Birth: 04-04-1945 Today's Date: 01/05/2012  Time: 3086-5784 (cotx with Speech Therapy - group time 1130-1155) Pt denies pain Individual Therapy Pt participated in self feeding group with focus on increased use of LUE, swallowing strategies, and attention to task.  Pt very lethargic and required max verbal cues to keep eyes open and attend to task.   Lavone Neri [facility] 01/05/2012, 2:57 PM

## 2012-01-06 ENCOUNTER — Inpatient Hospital Stay (HOSPITAL_COMMUNITY): Payer: BC Managed Care – PPO | Admitting: *Deleted

## 2012-01-06 ENCOUNTER — Inpatient Hospital Stay (HOSPITAL_COMMUNITY): Payer: BC Managed Care – PPO | Admitting: Speech Pathology

## 2012-01-06 LAB — PROTIME-INR
INR: 2.41 — ABNORMAL HIGH (ref 0.00–1.49)
Prothrombin Time: 25.1 seconds — ABNORMAL HIGH (ref 11.6–15.2)

## 2012-01-06 LAB — GLUCOSE, CAPILLARY: Glucose-Capillary: 101 mg/dL — ABNORMAL HIGH (ref 70–99)

## 2012-01-06 MED ORDER — WARFARIN SODIUM 5 MG PO TABS
5.0000 mg | ORAL_TABLET | Freq: Once | ORAL | Status: AC
  Administered 2012-01-06: 5 mg via ORAL
  Filled 2012-01-06: qty 1

## 2012-01-06 NOTE — Progress Notes (Signed)
ANTICOAGULATION CONSULT NOTE - Follow Up Consult  Pharmacy Consult:  Coumadin Indication: atrial fibrillation  No Known Allergies  Patient Measurements: Ht: 66", Wt: 95.5 Kg  Vital Signs: Temp: 98.3 F (36.8 C) (12/07 0500) Temp src: Oral (12/07 0500) BP: 136/88 mmHg (12/07 0500) Pulse Rate: 69  (12/07 0500)  Labs:  Basename 01/06/12 0914 01/05/12 0630 01/04/12 0600  HGB -- -- --  HCT -- -- --  PLT -- -- --  APTT -- -- --  LABPROT 25.1* 31.8* 25.6*  INR 2.41* 3.31* 2.47*  HEPARINUNFRC -- -- --  CREATININE -- -- --  CKTOTAL -- -- --  CKMB -- -- --  TROPONINI -- -- --    The CrCl is unknown because both a height and weight (above a minimum accepted value) are required for this calculation.     Assessment: 66 y/o male patient with a history of Afib who was on ASA 81 mg PTA and admitted with a stroke, presenting outside of window for TPA.  Patient continues on Coumadin therapy.  INR decreased to therapeutic level today, no bleeding documented.  Noted Dilantin therapy could increase the effect of Coumadin.   Goal of Therapy:  INR 2-3    Plan:  - Coumadin 5mg  PO today - Daily PT / INR    Librada Castronovo D. Laney Potash, PharmD, BCPS Pager:  [phone] 01/06/2012, 11:30 AM

## 2012-01-06 NOTE — Progress Notes (Signed)
Occupational Therapy Note  Patient Details  Name: Brendan Scott MRN: [id number] Date of Birth: 1945-03-05 Today's Date: 01/06/2012  Individual session:   Time:  0400-0430 (30 min) Pain:  None  Engaged in set up with bioness for LUE.  Pt. Practiced open extension exercises with shoulder flexion, abduction, elbow extension and hand opening with minimal facilitation.  Instructed pt on opening exercises to do with wife in the room.  Pt. Verbalized understanding.      Humberto Seals 01/06/2012, 5:59 PM

## 2012-01-06 NOTE — Progress Notes (Signed)
Patient ID: Brendan Scott, male   DOB: [birth year], 66 y.o.   MRN: [id number] Subjective/Complaints:  66 y.o. right-handed male with history of atrial fibrillation on aspirin therapy prior to admission as well as diabetes mellitus. Admitted 12/15/2011 after being found in his driveway by family unresponsive. EMS arrived and noted patient to be slightly confused with left facial droop and dysarthria. MRI of the brain showed a large right middle cerebral artery distribution nonhemorrhagic infarct as well as incidental findings of large left sphenoid wing mass consistent with meningioma. He was not considered a candidate for intravenous thrombolytic therapy with TPA. MRA of the head showed high-grade stenosis proximal basilar artery. Echocardiogram with ejection fraction of 60% and no wall motion abnormalities without emboli. Cerebral angiogram showed no occlusions or stenosis. There was reported seizure 12/16/2011 and patient has been loaded with Keppra /Dilantin as well as vimpat. EEG did not show definite epileptiform discharges. Throughout his hospital course there as been recurrent seizures and has since resolved as of 12/23/2011 Neurology services consulted presently maintained on aspirin therapy as well as subcutaneous Lovenox for DVT prophylaxis. On 11/25 the EKG consistent with atrial fibrillation with cardiac enzymes negative. Coumadin was initiated 12/25/2011 and aspirin was discontinued. He would remain on Lovenox for bridging until INR greater than 2.00. Speech therapy followup for dysphagia and diet has slowly been advanced to a dysphagia 1 pudding thick liquid diet with latest swallow study 12/25/2011. He continued with IV fluids for hydration secondary to pudding thick liquids.  Objective: No c/o no cough or SOB, no seizures Diet upgraded after MBS Review of Systems  Respiratory: Negative for cough.   All other systems reviewed and are negative.   Vital Signs: Blood pressure 136/88, pulse 69,  temperature 98.3 F (36.8 C), temperature source Oral, resp. rate 18, SpO2 100.00%.   Required 2 people to stand from bed Chest cta cv rig rate abd-- soft, NT   Assessment/Plan: 1. Functional deficits secondary to embolic R MCA infarct with L HP  Medical Problem List and Plan:  1. 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 DVT Prophylaxis/Anticoagulation: Chronic Coumadin therapy with INR 1.77 12/27/2011. Continue Lovenox 40 mg daily  Until INR 2.00. Monitor platelet counts any signs of bleeding  3. Neuropsych: This patient is not capable of making decisions on his/her own behalf.  4. Recurrent seizures. EEG was negative. Continue Klonopin 0.5 mg each bedtime, vimpat 300mg  bid,keppra 2500mg  Q12hour, Dilantin 100 mg 3 times a day. Monitor closely for a seizure activity  5. Hypertension/atrial fibrillation of new onset. Lisinopril 5 mg daily. Cardiac rate is controlled. There's been no reports of chest pain or shortness of breath   6. Dysphagia. Dysphagia 3 thin liquids Continue intravenous fluids for hydration  7. Non-insulin-dependent diabetes mellitus: Hemoglobin A1c 6.9..  CBG (last 3)   Basename 01/06/12 0741 01/05/12 2107 01/05/12 1617  GLUCAP 100* 142* 119*      LOS (Days) 10 A FACE TO FACE EVALUATION WAS PERFORMED  Jayen Bromwell HENRY 01/06/2012, 9:58 AM

## 2012-01-07 ENCOUNTER — Inpatient Hospital Stay (HOSPITAL_COMMUNITY): Payer: BC Managed Care – PPO

## 2012-01-07 LAB — GLUCOSE, CAPILLARY
Glucose-Capillary: 123 mg/dL — ABNORMAL HIGH (ref 70–99)
Glucose-Capillary: 88 mg/dL (ref 70–99)

## 2012-01-07 LAB — PROTIME-INR: INR: 2.5 — ABNORMAL HIGH (ref 0.00–1.49)

## 2012-01-07 MED ORDER — WARFARIN SODIUM 5 MG PO TABS
5.0000 mg | ORAL_TABLET | ORAL | Status: DC
  Administered 2012-01-09 – 2012-01-16 (×3): 5 mg via ORAL
  Filled 2012-01-07 (×4): qty 1

## 2012-01-07 MED ORDER — WARFARIN SODIUM 2.5 MG PO TABS
2.5000 mg | ORAL_TABLET | ORAL | Status: DC
  Administered 2012-01-07 – 2012-01-18 (×9): 2.5 mg via ORAL
  Filled 2012-01-07 (×9): qty 1

## 2012-01-07 NOTE — Progress Notes (Signed)
Occupational Therapy Session Note  Patient Details  Name: Brendan Scott MRN: [id number] Date of Birth: 10/31/1945  Today's Date: 01/07/2012 Time: 6213-0865 Time Calculation (min): 43 min  Short Term Goals: Week 2:  OT Short Term Goal 1 (Week 2): Pt will demonstrate increased LUE motor control by bathing right arm with min assist OT Short Term Goal 2 (Week 2): Pt will demonstrate improved motor planning skills with donning shirt with mod assist 3/4 times OT Short Term Goal 3 (Week 2): Pt will demonstrate improved motor planning skills with completing LB dressing with mod assist OT Short Term Goal 4 (Week 2): Pt will complete toilet transfer with mod assist 3/4 times OT Short Term Goal 5 (Week 2): Pt will complete toileting with mod assist (2/3 steps)  Skilled Therapeutic Interventions: ADL-retraining at shower-level with emphasis on attention to left, transfers, dynamic sitting and standing balance, safety, effective use of left UE during bathing, sequencing.  Patient is cooperative during ADL and motivated to progress but requires simple sequencing cues for safety and manual contact to turn and look to the left approx 50% of the time.   Patient able to complete functional transfer typically with only min assist (steadying) and positioning cues.      Therapy Documentation Precautions:  Precautions Precautions: Fall Precaution Comments: Very limited L knee flexion ROM  Restrictions Weight Bearing Restrictions: No  Pain: Pain Assessment Pain Assessment: No/denies pain  See FIM for current functional status  Therapy/Group: Individual Therapy  Georgeanne Nim 01/07/2012, 12:40 PM

## 2012-01-07 NOTE — Progress Notes (Signed)
ANTICOAGULATION CONSULT NOTE - Follow Up Consult  Pharmacy Consult:  Coumadin Indication: atrial fibrillation  No Known Allergies  Patient Measurements: Ht: 66", Wt: 95.5 Kg  Vital Signs: Temp: 98 F (36.7 C) (12/08 0500) Temp src: Oral (12/08 0500) BP: 128/73 mmHg (12/08 0500) Pulse Rate: 70  (12/08 0500)  Labs:  Basename 01/07/12 0715 01/06/12 0914 01/05/12 0630  HGB -- -- --  HCT -- -- --  PLT -- -- --  APTT -- -- --  LABPROT 25.8* 25.1* 31.8*  INR 2.50* 2.41* 3.31*  HEPARINUNFRC -- -- --  CREATININE -- -- --  CKTOTAL -- -- --  CKMB -- -- --  TROPONINI -- -- --    The CrCl is unknown because both a height and weight (above a minimum accepted value) are required for this calculation.     Assessment: 66 y/o male patient with a history of Afib who was on ASA 81 mg PTA and admitted with a stroke, presenting outside of window for TPA.  Patient continues on Coumadin therapy with therapeutic INR.  No bleeding documented.  Noted Dilantin therapy could increase the effect of Coumadin.   Goal of Therapy:  INR 2-3    Plan:  - Coumadin 2.5mg  PO daily except 5mg  on Tues / Fri - Daily PT / INR for now    Koven Belinsky D. Laney Potash, PharmD, BCPS Pager:  [phone] 01/07/2012, 10:57 AM

## 2012-01-07 NOTE — Progress Notes (Signed)
Patient ID: Brendan Scott, male   DOB: Jan 03, 1946, 66 y.o.   MRN: [id number] Subjective/Complaints:  66 y.o. right-handed male with history of atrial fibrillation on aspirin therapy prior to admission as well as diabetes mellitus. Admitted 12/15/2011 after being found in his driveway by family unresponsive. EMS arrived and noted patient to be slightly confused with left facial droop and dysarthria. MRI of the brain showed a large right middle cerebral artery distribution nonhemorrhagic infarct as well as incidental findings of large left sphenoid wing mass consistent with meningioma. He was not considered a candidate for intravenous thrombolytic therapy with TPA. MRA of the head showed high-grade stenosis proximal basilar artery. Echocardiogram with ejection fraction of 60% and no wall motion abnormalities without emboli. Cerebral angiogram showed no occlusions or stenosis. There was reported seizure 12/16/2011 and patient has been loaded with Keppra /Dilantin as well as vimpat. EEG did not show definite epileptiform discharges. Throughout his hospital course there as been recurrent seizures and has since resolved as of 12/23/2011 Neurology services consulted presently maintained on aspirin therapy as well as subcutaneous Lovenox for DVT prophylaxis. On 11/25 the EKG consistent with atrial fibrillation with cardiac enzymes negative. Coumadin was initiated 12/25/2011 and aspirin was discontinued. He would remain on Lovenox for bridging until INR greater than 2.00. Speech therapy followup for dysphagia and diet has slowly been advanced to a dysphagia 1 pudding thick liquid diet with latest swallow study 12/25/2011. He continued with IV fluids for hydration secondary to pudding thick liquids.  Objective: Feels well Eating well He is encouraged with progress (wife too) Review of Systems  Respiratory: Negative for cough.    Vital Signs: Blood pressure 128/73, pulse 70, temperature 98 F (36.7 C), temperature  source Oral, resp. rate 18, SpO2 100.00%.    Chest cta cv reg rate abd-- soft, NT   Assessment/Plan: 1. Functional deficits secondary to embolic R MCA infarct with L HP  Medical Problem List and Plan:  1. 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 DVT Prophylaxis/Anticoagulation: Chronic Coumadin. Lab Results  Component Value Date   INR 2.50* 01/07/2012   INR 2.41* 01/06/2012   INR 3.31* 01/05/2012     3. Neuropsych: This patient is not capable of making decisions on his/her own behalf.  4. Recurrent seizures. EEG was negative. Continue Klonopin 0.5 mg each bedtime, vimpat 300mg  bid,keppra 2500mg  Q12hour, Dilantin 100 mg 3 times a day. Monitor closely for a seizure activity Wife is concerned that sz meds make him too sleepy  5. Hypertension/atrial fibrillation of new onset. Lisinopril 5 mg daily. Cardiac rate is controlled. There's been no reports of chest pain or shortness of breath   6. Dysphagia. Dysphagia 3 thin liquids Continue intravenous fluids for hydration  7. Non-insulin-dependent diabetes mellitus: Hemoglobin A1c 6.9..  CBG (last 3)   Basename 01/07/12 0728 01/06/12 2043 01/06/12 1656  GLUCAP 123* 145* 101*      LOS (Days) 11 A FACE TO FACE EVALUATION WAS PERFORMED  Rahel Carlton HENRY 01/07/2012, 9:43 AM

## 2012-01-08 ENCOUNTER — Inpatient Hospital Stay (HOSPITAL_COMMUNITY): Payer: BC Managed Care – PPO | Admitting: Speech Pathology

## 2012-01-08 ENCOUNTER — Inpatient Hospital Stay (HOSPITAL_COMMUNITY): Payer: BC Managed Care – PPO | Admitting: Occupational Therapy

## 2012-01-08 ENCOUNTER — Inpatient Hospital Stay (HOSPITAL_COMMUNITY): Payer: BC Managed Care – PPO | Admitting: Physical Therapy

## 2012-01-08 DIAGNOSIS — G8110 Spastic hemiplegia affecting unspecified side: Secondary | ICD-10-CM

## 2012-01-08 DIAGNOSIS — I6340 Cerebral infarction due to embolism of unspecified cerebral artery: Secondary | ICD-10-CM

## 2012-01-08 LAB — GLUCOSE, CAPILLARY: Glucose-Capillary: 125 mg/dL — ABNORMAL HIGH (ref 70–99)

## 2012-01-08 LAB — PHENYTOIN LEVEL, TOTAL: Phenytoin Lvl: 9.8 ug/mL — ABNORMAL LOW (ref 10.0–20.0)

## 2012-01-08 LAB — BASIC METABOLIC PANEL
Chloride: 102 mEq/L (ref 96–112)
Creatinine, Ser: 1.09 mg/dL (ref 0.50–1.35)
GFR calc Af Amer: 80 mL/min — ABNORMAL LOW (ref >90)
GFR calc non Af Amer: 69 mL/min — ABNORMAL LOW (ref >90)

## 2012-01-08 MED ORDER — GLUCERNA SHAKE PO LIQD
237.0000 mL | Freq: Two times a day (BID) | ORAL | Status: DC
  Administered 2012-01-09 – 2012-01-20 (×14): 237 mL via ORAL

## 2012-01-08 MED ORDER — ENSURE PUDDING PO PUDG
1.0000 | ORAL | Status: DC
  Administered 2012-01-09 – 2012-01-19 (×7): 1 via ORAL

## 2012-01-08 NOTE — Progress Notes (Signed)
ANTICOAGULATION CONSULT NOTE - Follow Up Consult  Pharmacy Consult:  Coumadin Indication: atrial fibrillation  No Known Allergies  Patient Measurements: Ht: 66", Wt: 95.5 Kg  Vital Signs: Temp: 98.1 F (36.7 C) (12/09 0520) Temp src: Oral (12/09 0520) BP: 111/78 mmHg (12/09 0520) Pulse Rate: 81  (12/09 0520)  Labs:  Alvira Philips 01/08/12 0802 01/07/12 0715 01/06/12 0914  HGB -- -- --  HCT -- -- --  PLT -- -- --  APTT -- -- --  LABPROT 26.9* 25.8* 25.1*  INR 2.64* 2.50* 2.41*  HEPARINUNFRC -- -- --  CREATININE -- -- --  CKTOTAL -- -- --  CKMB -- -- --  TROPONINI -- -- --    The CrCl is unknown because both a height and weight (above a minimum accepted value) are required for this calculation.     Assessment: 66 y/o male patient with a history of Afib who was on ASA 81 mg PTA and admitted with a stroke, presenting outside of window for TPA.  Patient continues on Coumadin therapy with therapeutic INR.  No bleeding documented.  Noted Dilantin therapy could increase the effect of Coumadin.   Goal of Therapy:  INR 2-3    Plan:  - Coumadin 2.5mg  PO daily except 5mg  on Tues / Fri - Daily PT / INR for now    Talbert Cage, PharmD Pager:  [phone] 01/08/2012, 9:55 AM

## 2012-01-08 NOTE — Progress Notes (Addendum)
Speech Language Pathology Daily Session Note  Patient Details  Name: Brendan Scott MRN: [id number] Date of Birth: 10-06-1945  Today's Date: 01/08/2012 Time: 1130-1200 Time Calculation (min): 30 min  Short Term Goals: Week 2: SLP Short Term Goal 1 (Week 2): Patient will self-monitor and correct left anterior loss of textures and liquids during p.o. intake with mod assist verbal and question cues. SLP Short Term Goal 2 (Week 2): Patient will utilize swallowing compensatory strategies while consuming Dys.2 textures and thin liquids with min assist verbal cues.  SLP Short Term Goal 3 (Week 2): Patient will demonstrate selective attention to a functional task for 10 minutes with mod assist verbal and question cues for redirection  SLP Short Term Goal 4 (Week 2): Patient will attend to left enviornment/body with mod assist tactile and verbal cues.  SLP Short Term Goal 5 (Week 2): Patient will maintain topic of conversation for ~3 turns with max assist verbal and question cues for redirection. SLP Short Term Goal 6 (Week 2): Patient will identify 1 physical and 1 cognitive deficit with mod assist semantic cues.   Skilled Therapeutic Interventions: Group session, co-treatment with OT; SLP focused on addressing dysphagia goals. Patient consumed Dys.2 textures and thin liquids with mod-max assist to self-monitor and compensate for left pocketing and anterior loss. Patient also demonstrated cough toward end of meal due to build up pocketing.  Considering presentation today; SLP recommends diet downgrade to Dys.1 textures and thin liquids with continued full supervision for double swallows and intermittent cough.      FIM:  Comprehension Comprehension Mode: Auditory Comprehension: 3-Understands basic 50 - 74% of the time/requires cueing 25 - 50%  of the time Expression Expression Mode: Verbal Expression: 3-Expresses basic 50 - 74% of the time/requires cueing 25 - 50% of the time. Needs to repeat parts of  sentences. Social Interaction Social Interaction: 4-Interacts appropriately 75 - 89% of the time - Needs redirection for appropriate language or to initiate interaction. Problem Solving Problem Solving: 2-Solves basic 25 - 49% of the time - needs direction more than half the time to initiate, plan or complete simple activities Memory Memory: 2-Recognizes or recalls 25 - 49% of the time/requires cueing 51 - 75% of the time FIM - Eating Eating Activity: 4: Helper checks for pocketed food  Pain Pain Assessment Pain Assessment: No/denies pain  Therapy/Group: Group Therapy  Charlane Ferretti., CCC-SLP [phone]  Brendan Scott 01/08/2012, 1:16 PM

## 2012-01-08 NOTE — Progress Notes (Signed)
Occupational Therapy Session Note  Patient Details  Name: Brendan Scott MRN: [id number] Date of Birth: 07-04-1945  Today's Date: 01/08/2012 Time: 1200-1215 Time Calculation (min): 15 min  Skilled Therapeutic Interventions/Progress Updates:    Pt seen in [employer] today with focus on swallowing strategies, attention to task, LUE positioning. Pt required max assist/v/c's secondary to pocketing and lethargy.  Therapy Documentation Precautions:  Precautions Precautions: Fall Precaution Comments: Very limited L knee flexion ROM  Restrictions Weight Bearing Restrictions: No     Pain: Pain Assessment Pain Assessment: No/denies pain     See FIM for current functional status  Therapy/Group: Group Therapy  Brendan Scott, Brendan Scott Brendan Scott 01/08/2012, 1:08 PM

## 2012-01-08 NOTE — Progress Notes (Signed)
Nutrition Follow-up  Intervention:   Continue to Encourage intake Change Ensure pudding to once daily Add Glucerna Shake bid  Assessment:   Diet changed to thin liquids.  Intake remains inadequate with approximately 40-60% meals consumed.    Diet Order:  Dysphagia 1 thin liquids with Ensure pudding tid  Meds: Scheduled Meds:   . clonazePAM  0.5 mg Oral QHS  . feeding supplement  1 Container Oral TID PC & HS  . insulin aspart  0-9 Units Subcutaneous TID WC  . lacosamide  300 mg Oral BID  . levETIRAcetam  2,500 mg Oral Q12H  . lisinopril  5 mg Oral Daily  . metFORMIN  500 mg Oral Q breakfast  . phenytoin  100 mg Oral TID  . senna-docusate  1 tablet Oral QHS  . simvastatin  20 mg Oral q1800  . warfarin  2.5 mg Oral Custom  . warfarin  5 mg Oral Custom  . warfarin   Does not apply Once  . Warfarin - Pharmacist Dosing Inpatient   Does not apply q1800   Continuous Infusions:   . [DISCONTINUED] sodium chloride Stopped (01/08/12 0733)   PRN Meds:.acetaminophen, ondansetron (ZOFRAN) IV, ondansetron, polyethylene glycol, RESOURCE THICKENUP CLEAR, sorbitol   CMP     Component Value Date/Time   NA 137 01/08/2012 0802   K 4.0 01/08/2012 0802   CL 102 01/08/2012 0802   CO2 27 01/08/2012 0802   GLUCOSE 144* 01/08/2012 0802   BUN 10 01/08/2012 0802   CREATININE 1.09 01/08/2012 0802   CALCIUM 9.7 01/08/2012 0802   PROT 6.5 12/29/2011 0555   ALBUMIN 2.7* 12/29/2011 0555   AST 42* 12/29/2011 0555   ALT 75* 12/29/2011 0555   ALKPHOS 107 12/29/2011 0555   BILITOT 0.2* 12/29/2011 0555   GFRNONAA 69* 01/08/2012 0802   GFRAA 80* 01/08/2012 0802    CBG (last 3)   Basename 01/08/12 1616 01/08/12 1116 01/08/12 0723  GLUCAP 78 174* 125*     Intake/Output Summary (Last 24 hours) at 01/08/12 1752 Last data filed at 01/08/12 1707  Gross per 24 hour  Intake   1260 ml  Output    675 ml  Net    585 ml    Weight Status:  No new weight  Re-estimated needs:  1800-1900 kcal, 90-100 gm  protein  Nutrition Dx:  Inadequate oral intake r/t dysphagia AEB dysphagia 1 thin liquid diet  Goal:  Intake of >90% meals  Monitor:  Intake, labs, weight   Oran Rein, RD, LDN Clinical Inpatient Dietitian Pager:  [phone] Weekend and after hours pager:  [phone]

## 2012-01-08 NOTE — Progress Notes (Signed)
Occupational Therapy Session Note  Patient Details  Name: Brendan Scott MRN: [id number] Date of Birth: 1945-11-15  Today's Date: 01/08/2012 Time: 1000-1100 Time Calculation (min): 60 min  Short Term Goals: Week 2:  OT Short Term Goal 1 (Week 2): Pt will demonstrate increased LUE motor control by bathing right arm with min assist OT Short Term Goal 2 (Week 2): Pt will demonstrate improved motor planning skills with donning shirt with mod assist 3/4 times OT Short Term Goal 3 (Week 2): Pt will demonstrate improved motor planning skills with completing LB dressing with mod assist OT Short Term Goal 4 (Week 2): Pt will complete toilet transfer with mod assist 3/4 times OT Short Term Goal 5 (Week 2): Pt will complete toileting with mod assist (2/3 steps)  Skilled Therapeutic Interventions/Progress Updates:    Pt seen for ADL retraining at sink with focus on sit <> stand, dynamic standing balance, increased use of LUE with self-care tasks, and increased participation and attention to dressing tasks.  Engaged in bathing at sink to further facilitate initiation, sequencing, and forced use of LUE with bathing and dressing tasks. Hand over hand assist at Lt hand with bathing of Rt arm and assisting with grasp to attempt to pull up pants. Encouraged pt to direct visual attention to Lt hand to increase attention and functional use of LUE.  Pt with increased carryover with bathing with requiring fewer cues for initiation and cessation once task completed.  Pt demonstrated carryover of LB dressing techniques taught in previous sessions with donning Lt leg first, with pt stating "left side first, right?".  Pt required hand over hand assist with pulling up underwear and pants secondary to decreased Lt grasp strength and decreased attention to Lt side.  Backwards chaining with attempts to don socks, pt unable to don socks do to decreased attention and noted wheezing when bending forwards.     Therapy  Documentation Precautions:  Precautions Precautions: Fall Precaution Comments: Very limited L knee flexion ROM  Restrictions Weight Bearing Restrictions: No Pain: Pain Assessment Pain Assessment: No/denies pain  See FIM for current functional status  Therapy/Group: Individual Therapy  Leonette Monarch 01/08/2012, 11:00 AM

## 2012-01-08 NOTE — Progress Notes (Signed)
Speech Language Pathology Daily Session Note  Patient Details  Name: Brendan Scott MRN: [id number] Date of Birth: 1945-11-27  Today's Date: 01/08/2012 Time: 1000-1100 Time Calculation (min): 60 min  Short Term Goals: Week 2: SLP Short Term Goal 1 (Week 2): Patient will self-monitor and correct left anterior loss of textures and liquids during p.o. intake with mod assist verbal and question cues. SLP Short Term Goal 2 (Week 2): Patient will utilize swallowing compensatory strategies while consuming Dys.2 textures and thin liquids with min assist verbal cues.  SLP Short Term Goal 3 (Week 2): Patient will demonstrate selective attention to a functional task for 10 minutes with mod assist verbal and question cues for redirection  SLP Short Term Goal 4 (Week 2): Patient will attend to left enviornment/body with mod assist tactile and verbal cues.  SLP Short Term Goal 5 (Week 2): Patient will maintain topic of conversation for ~3 turns with max assist verbal and question cues for redirection. SLP Short Term Goal 6 (Week 2): Patient will identify 1 physical and 1 cognitive deficit with mod assist semantic cues.   Skilled Therapeutic Interventions: Skilled treatment session focused on addressing dysphagia and cognition goals during self-care tasks. RN informed SLP that wife insisted patient prior to SLP session, upon arriving to session wife expressed concern that her husband had PNA and that the MD was checking on it, SLP educated wife on role of SLP and need for skilled observer with meals to assess for toleration and s/s of penetration/aspiration.  SLP facilitated session with max assist to sequence bed to chair transfer, sequence 4 ADL picture cards and max faded to min assist to scan let while locating room.  SLP also facilitated session with trials of thin via cup with mod assist verbal cues to perform double swallows and intermittent hard cough.  No overt s/s were observed however, SLP unable to  assess for toleration of Dys.2 textures.       FIM:  Comprehension Comprehension Mode: Auditory Comprehension: 3-Understands basic 50 - 74% of the time/requires cueing 25 - 50%  of the time Expression Expression Mode: Verbal Expression: 3-Expresses basic 50 - 74% of the time/requires cueing 25 - 50% of the time. Needs to repeat parts of sentences. Social Interaction Social Interaction: 4-Interacts appropriately 75 - 89% of the time - Needs redirection for appropriate language or to initiate interaction. Problem Solving Problem Solving: 2-Solves basic 25 - 49% of the time - needs direction more than half the time to initiate, plan or complete simple activities Memory Memory: 2-Recognizes or recalls 25 - 49% of the time/requires cueing 51 - 75% of the time  Pain Pain Assessment Pain Assessment: No/denies pain  Therapy/Group: Individual Therapy  Brendan Scott., CCC-SLP [phone]  Brendan Scott 01/08/2012, 11:06 AM

## 2012-01-08 NOTE — Progress Notes (Signed)
Patient ID: Brendan Scott, male   DOB: 1946/01/27, 66 y.o.   MRN: [id number] Subjective/Complaints:  66 y.o. right-handed male with history of atrial fibrillation on aspirin therapy prior to admission as well as diabetes mellitus. Admitted 12/15/2011 after being found in his driveway by family unresponsive. EMS arrived and noted patient to be slightly confused with left facial droop and dysarthria. MRI of the brain showed a large right middle cerebral artery distribution nonhemorrhagic infarct as well as incidental findings of large left sphenoid wing mass consistent with meningioma. He was not considered a candidate for intravenous thrombolytic therapy with TPA. MRA of the head showed high-grade stenosis proximal basilar artery. Echocardiogram with ejection fraction of 60% and no wall motion abnormalities without emboli. Cerebral angiogram showed no occlusions or stenosis. There was reported seizure 12/16/2011 and patient has been loaded with Keppra /Dilantin as well as vimpat. EEG did not show definite epileptiform discharges. Throughout his hospital course there as been recurrent seizures and has since resolved as of 12/23/2011 Neurology services consulted presently maintained on aspirin therapy as well as subcutaneous Lovenox for DVT prophylaxis. On 11/25 the EKG consistent with atrial fibrillation with cardiac enzymes negative. Coumadin was initiated 12/25/2011 and aspirin was discontinued. He would remain on Lovenox for bridging until INR greater than 2.00. Speech therapy followup for dysphagia and diet has slowly been advanced to a dysphagia 1 pudding thick liquid diet with latest swallow study 12/25/2011. He continued with IV fluids for hydration secondary to pudding thick liquids.  Objective: No c/o no cough or SOB, no seizures Diet upgraded after MBS Wife concerned about pt being like a zombie, asking about seizure meds Review of Systems  Respiratory: Negative for cough and shortness of breath.    Neurological: Positive for focal weakness. Negative for seizures.  All other systems reviewed and are negative.   Vital Signs: Blood pressure 111/78, pulse 81, temperature 98.1 F (36.7 C), temperature source Oral, resp. rate 18, SpO2 99.00%. No results found. Results for orders placed during the hospital encounter of 12/27/11 (from the past 72 hour(s))  GLUCOSE, CAPILLARY     Status: Abnormal   Collection Time   01/05/12 11:12 AM      Component Value Range Comment   Glucose-Capillary 149 (*) 70 - 99 mg/dL    Comment 1 Notify RN     GLUCOSE, CAPILLARY     Status: Abnormal   Collection Time   01/05/12  4:17 PM      Component Value Range Comment   Glucose-Capillary 119 (*) 70 - 99 mg/dL    Comment 1 Notify RN     GLUCOSE, CAPILLARY     Status: Abnormal   Collection Time   01/05/12  9:07 PM      Component Value Range Comment   Glucose-Capillary 142 (*) 70 - 99 mg/dL    Comment 1 Notify RN     GLUCOSE, CAPILLARY     Status: Abnormal   Collection Time   01/06/12  7:41 AM      Component Value Range Comment   Glucose-Capillary 100 (*) 70 - 99 mg/dL   PROTIME-INR     Status: Abnormal   Collection Time   01/06/12  9:14 AM      Component Value Range Comment   Prothrombin Time 25.1 (*) 11.6 - 15.2 seconds    INR 2.41 (*) 0.00 - 1.49   GLUCOSE, CAPILLARY     Status: Abnormal   Collection Time   01/06/12 11:32 AM  Component Value Range Comment   Glucose-Capillary 164 (*) 70 - 99 mg/dL   GLUCOSE, CAPILLARY     Status: Abnormal   Collection Time   01/06/12  4:56 PM      Component Value Range Comment   Glucose-Capillary 101 (*) 70 - 99 mg/dL   GLUCOSE, CAPILLARY     Status: Abnormal   Collection Time   01/06/12  8:43 PM      Component Value Range Comment   Glucose-Capillary 145 (*) 70 - 99 mg/dL    Comment 1 Notify RN     PROTIME-INR     Status: Abnormal   Collection Time   01/07/12  7:15 AM      Component Value Range Comment   Prothrombin Time 25.8 (*) 11.6 - 15.2 seconds     INR 2.50 (*) 0.00 - 1.49   GLUCOSE, CAPILLARY     Status: Abnormal   Collection Time   01/07/12  7:28 AM      Component Value Range Comment   Glucose-Capillary 123 (*) 70 - 99 mg/dL   GLUCOSE, CAPILLARY     Status: Abnormal   Collection Time   01/07/12 12:08 PM      Component Value Range Comment   Glucose-Capillary 162 (*) 70 - 99 mg/dL   GLUCOSE, CAPILLARY     Status: Normal   Collection Time   01/07/12  4:54 PM      Component Value Range Comment   Glucose-Capillary 88  70 - 99 mg/dL   GLUCOSE, CAPILLARY     Status: Abnormal   Collection Time   01/07/12  8:38 PM      Component Value Range Comment   Glucose-Capillary 178 (*) 70 - 99 mg/dL    Comment 1 Notify RN     GLUCOSE, CAPILLARY     Status: Abnormal   Collection Time   01/08/12  7:23 AM      Component Value Range Comment   Glucose-Capillary 125 (*) 70 - 99 mg/dL    Comment 1 Notify RN        HEENT: L lower facial droop Cardio: RRR Resp: Wheezes and Clear with cough GI: BS positive Extremity:  Pulses positive and No Edema Skin:   Intact Neuro: Lethargic, Cranial Nerve Abnormalities L central VII, Normal Sensory, Abnormal Motor 0/5 L hand , 3-/5 Bi.tri,delt,HF,KE,ankle DF/PF, Dysarthric, Inattention and Other Left neglect no field cut Musc/Skel:  Normal and Other no shoulder pain no ataxia Still pocketing food  Assessment/Plan: 1. Functional deficits secondary to embolic R MCA infarct with L HP which require 3+ hours per day of interdisciplinary therapy in a comprehensive inpatient rehab setting. Physiatrist is providing close team supervision and 24 hour management of active medical problems listed below. Physiatrist and rehab team continue to assess barriers to discharge/monitor patient progress toward functional and medical goals. FIM: FIM - Bathing Bathing Steps Patient Completed: Chest;Right Arm;Left Arm;Abdomen;Front perineal area;Buttocks;Right upper leg;Left upper leg Bathing: 4: Min-Patient completes 8-9 56f 10  parts or 75+ percent  FIM - Upper Body Dressing/Undressing Upper body dressing/undressing steps patient completed: Thread/unthread right sleeve of pullover shirt/dresss;Thread/unthread left sleeve of pullover shirt/dress Upper body dressing/undressing: 3: Mod-Patient completed 50-74% of tasks FIM - Lower Body Dressing/Undressing Lower body dressing/undressing steps patient completed: Thread/unthread right underwear leg;Pull pants up/down Lower body dressing/undressing: 2: Max-Patient completed 25-49% of tasks  FIM - Toileting Toileting steps completed by patient: Adjust clothing after toileting Toileting Assistive Devices: Grab bar or rail for support Toileting: 2: Max-Patient  completed 1 of 3 steps  FIM - Diplomatic Services operational officer Devices: Grab bars;Elevated toilet seat Toilet Transfers: 3-To toilet/BSC: Mod A (lift or lower assist);3-From toilet/BSC: Mod A (lift or lower assist)  FIM - Banker Devices: Walker;Bed rails Bed/Chair Transfer: 5: Supine > Sit: Supervision (verbal cues/safety issues)  FIM - Locomotion: Wheelchair Distance: 120 Locomotion: Wheelchair: 0: Activity did not occur FIM - Locomotion: Ambulation Locomotion: Ambulation Assistive Devices: Walker - Rolling;Orthosis Ambulation/Gait Assistance: 4: Min assist Locomotion: Ambulation: 4: Travels 150 ft or more with minimal assistance (Pt.>75%)  Comprehension Comprehension Mode: Auditory Comprehension: 5-Follows basic conversation/direction: With extra time/assistive device  Expression Expression Mode: Verbal Expression: 3-Expresses basic 50 - 74% of the time/requires cueing 25 - 50% of the time. Needs to repeat parts of sentences.  Social Interaction Social Interaction: 4-Interacts appropriately 75 - 89% of the time - Needs redirection for appropriate language or to initiate interaction.  Problem Solving Problem Solving: 3-Solves basic 50 - 74% of the  time/requires cueing 25 - 49% of the time  Memory Memory: 3-Recognizes or recalls 50 - 74% of the time/requires cueing 25 - 49% of the time  Medical Problem List and Plan:  1. 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 DVT Prophylaxis/Anticoagulation: Chronic Coumadin therapy .  Monitor platelet counts any signs of bleeding  3. Neuropsych: This patient is not capable of making decisions on his/her own behalf.  4. Recurrent seizures. EEG was negative. Continue Klonopin 0.5 mg each bedtime, vimpat 300mg  bid,keppra 2500mg  Q12hour, Dilantin 100 mg 3 times a day.Recheck level ask neuro to re eval Monitor closely for a seizure activity  5. Hypertension/atrial fibrillation of new onset. Lisinopril 5 mg daily. Cardiac rate is controlled. There's been no reports of chest pain or shortness of breath   6. Dysphagia. Dysphagia 3 thin liquids D/C IVF 7. Non-insulin-dependent diabetes mellitus: Hemoglobin A1c 6.9.. Patient currently with sliding scale insulin. Patient was taking Glucophage 500 mg daily prior to admission. Sugars controlled  at present.  Cont glucopage at 500mg  qday, check BMET    LOS (Days) 12 A FACE TO FACE EVALUATION WAS PERFORMED  KIRSTEINS,ANDREW E 01/08/2012, 7:55 AM

## 2012-01-08 NOTE — Plan of Care (Signed)
Problem: RH KNOWLEDGE DEFICIT Goal: RH STG INCREASE KNOWLEDGE OF DYSPHAGIA/FLUID INTAKE Outcome: Not Progressing Patient/family not entirely aware of swallowing deficits/dysphagia r/t stroke. Patient requires multiple cues to check for L pocketing during intake and while taking medications. Medications continue to be crushed in puree due to pocketing during trials of whole in puree.

## 2012-01-08 NOTE — Progress Notes (Signed)
Physical Therapy Session Note  Patient Details  Name: Brendan Scott MRN: [id number] Date of Birth: May 20, 1945  Today's Date: 01/08/2012 Time: 5284-1324 Time Calculation (min): 76 min  Short Term Goals: Week 2:  PT Short Term Goal 1 (Week 2): Will perform all transfers and gait with min A and 25% cues for initiation, cessaition, sequencing  Skilled Therapeutic Interventions/Progress Updates:   Patient with increased coughing and wet vocalization this pm; SLP notified.  Patient very fatigued this pm.  Performed gait in controlled environment with RW and min A with verbal cues for guidance to family room with increased verbal, tactile and visual cues necessary to attend to L environment to turn L into family room.  In family room participated in functional Christmas tree decorating activity in standing placing a very specific number of certain color of ornament on the tree with verbal cues for visual scanning for empty areas to focus on dynamic standing balance, higher level gait training with side stepping and retro stepping, use of LUE, selective attention to task, L attention, sequencing, initiation, cessation of task; progressed from therapist verbally cuing patient to begin task with certain # and color of ornaments >> having patient decide number and color and placement of ornaments to facilitate more self-initiation and patient verbalizing when he was ready to stand or needing a rest break.  Patient verbalized need to use toilet.  Patient performed transfer to toilet with HHA min A and min A to stand to doff, perform hygiene and don clothing.  Performed transfer toilet > sink and stood to sequence washing hands with min A and mod verbal cues for sequencing and use of LUE.  Transfer sink > bed min HHA and sit > supine with supervision.    Therapy Documentation Precautions:  Precautions Precautions: Fall Precaution Comments: Very limited L knee flexion ROM  Restrictions Weight Bearing  Restrictions: No Pain: Pain Assessment Pain Assessment: No/denies pain Locomotion : Ambulation Ambulation/Gait Assistance: 4: Min assist   See FIM for current functional status  Therapy/Group: Individual Therapy  Edman Circle Faucette 01/08/2012, 2:20 PM

## 2012-01-08 NOTE — Progress Notes (Signed)
Recreational Therapy Session Note  Patient Details  Name: Brendan Scott MRN: [id number] Date of Birth: [birth year] Today's Date: 01/08/2012 Time:  1305-1400 Pain: no c/o Skilled Therapeutic Interventions/Progress Updates: Pt ambulated with RW from room to [facility] room with min assist.  Pt stood to hang decorations on the Christmas tree with Min assist, max verbal cues for initiation, sustained & selective attention, problem solving, & left inattention. [name] 01/08/2012, 3:07 PM

## 2012-01-09 ENCOUNTER — Inpatient Hospital Stay (HOSPITAL_COMMUNITY): Payer: BC Managed Care – PPO | Admitting: Speech Pathology

## 2012-01-09 ENCOUNTER — Inpatient Hospital Stay (HOSPITAL_COMMUNITY): Payer: BC Managed Care – PPO | Admitting: Occupational Therapy

## 2012-01-09 ENCOUNTER — Inpatient Hospital Stay (HOSPITAL_COMMUNITY): Payer: BC Managed Care – PPO | Admitting: Physical Therapy

## 2012-01-09 LAB — GLUCOSE, CAPILLARY
Glucose-Capillary: 139 mg/dL — ABNORMAL HIGH (ref 70–99)
Glucose-Capillary: 145 mg/dL — ABNORMAL HIGH (ref 70–99)

## 2012-01-09 NOTE — Progress Notes (Signed)
Speech Language Pathology Daily Session Note  Patient Details  Name: Brendan Scott MRN: [id number] Date of Birth: 07-23-1945  Today's Date: 01/09/2012 Time: 1130-1145 Time Calculation (min): 15 min  Short Term Goals: Week 2: SLP Short Term Goal 1 (Week 2): Patient will self-monitor and correct left anterior loss of textures and liquids during p.o. intake with mod assist verbal and question cues. SLP Short Term Goal 2 (Week 2): Patient will utilize swallowing compensatory strategies while consuming Dys.2 textures and thin liquids with min assist verbal cues.  SLP Short Term Goal 3 (Week 2): Patient will demonstrate selective attention to a functional task for 10 minutes with mod assist verbal and question cues for redirection  SLP Short Term Goal 4 (Week 2): Patient will attend to left enviornment/body with mod assist tactile and verbal cues.  SLP Short Term Goal 5 (Week 2): Patient will maintain topic of conversation for ~3 turns with max assist verbal and question cues for redirection. SLP Short Term Goal 6 (Week 2): Patient will identify 1 physical and 1 cognitive deficit with mod assist semantic cues.   Skilled Therapeutic Interventions: Group session, co-treatment with OT; SLP focused on addressing dysphagia goals. Patient consumed Dys.1 textures and thin liquids with mod assist to carryover safe swallow compensatory strategies.  Wife preset for session and provided cues with intermittent cues from SLP.  Patient exhibited occasional cough during meal; however, strong reflexive cough appeared to clear suspected penetrates.  Continue with current plan of care.   FIM:  Comprehension Comprehension Mode: Auditory Comprehension: 3-Understands basic 50 - 74% of the time/requires cueing 25 - 50%  of the time Expression Expression Mode: Verbal Expression: 3-Expresses basic 50 - 74% of the time/requires cueing 25 - 50% of the time. Needs to repeat parts of sentences. Social Interaction Social  Interaction: 3-Interacts appropriately 50 - 74% of the time - May be physically or verbally inappropriate. Problem Solving Problem Solving: 2-Solves basic 25 - 49% of the time - needs direction more than half the time to initiate, plan or complete simple activities Memory Memory: 2-Recognizes or recalls 25 - 49% of the time/requires cueing 51 - 75% of the time FIM - Eating Eating Activity: 4: Helper checks for pocketed food  Pain Pain Assessment Pain Assessment: No/denies pain  Therapy/Group: Group Therapy  Charlane Ferretti., CCC-SLP [phone]  Tom Macpherson 01/09/2012, 1:18 PM

## 2012-01-09 NOTE — Progress Notes (Addendum)
ANTICOAGULATION CONSULT NOTE - Follow Up Consult  Pharmacy Consult:  Coumadin Indication: atrial fibrillation  No Known Allergies  Patient Measurements: Ht: 66", Wt: 95.5 Kg  Vital Signs: Temp: 98.2 F (36.8 C) (12/10 0500) Temp src: Oral (12/10 0500) BP: 143/82 mmHg (12/10 0500) Pulse Rate: 74  (12/10 0500)  Labs:  Basename 01/09/12 0620 01/08/12 0802 01/07/12 0715  HGB -- -- --  HCT -- -- --  PLT -- -- --  APTT -- -- --  LABPROT 23.6* 26.9* 25.8*  INR 2.21* 2.64* 2.50*  HEPARINUNFRC -- -- --  CREATININE -- 1.09 --  CKTOTAL -- -- --  CKMB -- -- --  TROPONINI -- -- --    Assessment: 66 y/o male patient with a history of Afib who was on ASA 81 mg PTA and admitted with a stroke, presenting outside of window for TPA.  Patient continues on Coumadin therapy with therapeutic INR.  No bleeding documented.  Noted Dilantin therapy could increase the effect of Coumadin.  Noted DPH level 12/9 is 9.8, which corrects to 15.5 mcg/ml (for low alb 2.7). Goal DPH level 10-20 mcg/ml. No seizures noted. Appears DPH dose ok with warfarin dosing.  Goal of Therapy:  INR 2-3    Plan:  - Coumadin 2.5mg  PO daily except 5mg  on Tues / Fri - Change PT/INR to M/W/F   Christoper Fabian, PharmD, BCPS Clinical pharmacist, pager [phone] 01/09/2012, 2:14 PM

## 2012-01-09 NOTE — Progress Notes (Signed)
Physical Therapy Session Note  Patient Details  Name: Brendan Scott MRN: [id number] Date of Birth: 07-16-1945  Today's Date: 01/09/2012 Time: 2952-8413 Time Calculation (min): 59 min  Short Term Goals: Week 1:  PT Short Term Goal 1 (Week 1): Patient will perform bed mobility on flat bed with min A and 50% cues for initiation and sequencing PT Short Term Goal 1 - Progress (Week 1): Met PT Short Term Goal 2 (Week 1): Patient will perform bed <> w/c transfers with min A and 50% cues for initiation and sequencing PT Short Term Goal 2 - Progress (Week 1): Met PT Short Term Goal 3 (Week 1): Patient will perform gait in controlled environment with LRAD x 150' with mod A and 50% cues for safety, sequencing and attention to L environment PT Short Term Goal 3 - Progress (Week 1): Met PT Short Term Goal 4 (Week 1): Patient will perform up and down 4-6 stairs with bilat rails and min A with 50% cues for safety, sequencing PT Short Term Goal 4 - Progress (Week 1): Met Week 2:  PT Short Term Goal 1 (Week 2): Will perform all transfers and gait with min A and 25% cues for initiation, cessaition, sequencing  Skilled Therapeutic Interventions/Progress Updates:   Patient performed gait in controlled environment with RW and min A giving patient verbal cues for guidance to gym to assess patient's ability to recall directions appropriately; patient required total verbal cues for sequence of L hand set up on hand orthosis and required total A at nurses station to redirect patient towards gym (patient tends to turn to R to go to day room and requires total cues to stop and redirect).  Once in gym performed sit <> stand and AD training with 5-6 repetitions beginning with therapist verbalizing each step in the sequence of sit <> stand and placing or removing hand from orthosis and progressing to patient verbalizing whole sequence and performing with min verbal cues to recall removing strap prior to sitting and focus on  attention to L and use of LUE on arm rest to assist with sit <> stand and facilitate trunk rotation and UE extension. Reviewed stair negotiation with bilat UE support on rails up and down 5 stairs with min A alternating sequence.  Continued gait training in controlled environment x 100' with RW with focus on R and L lateral stepping, retro stepping with RW and horizontal head turns for attention to L environment during functional task of finding 6 towels in hall way all on the L side of hall with max-total verbal and visual and tactile cues for head turns and selective attention to towels.  Performed gait back to room with RW x 200' with min A and verbal cues for directions/guidance.  Assessed recall of hand orthosis sequencing for stand > sit; patient still requires mod-max A to recall how to unstrap and remove hand from RW prior to sitting. Will continue to address attention to L.  Therapy Documentation Precautions:  Precautions Precautions: Fall Precaution Comments: Very limited L knee flexion ROM  Restrictions Weight Bearing Restrictions: No Pain: Pain Assessment Pain Assessment: No/denies pain Locomotion : Ambulation Ambulation/Gait Assistance: 5: Supervision;4: Min guard   See FIM for current functional status  Therapy/Group: Individual Therapy  [facility] 01/09/2012, 3:45 PM

## 2012-01-09 NOTE — Progress Notes (Signed)
Occupational Therapy Session Note  Patient Details  Name: Brendan Scott MRN: [id number] Date of Birth: 1945-12-04  Today's Date: 01/09/2012 Time: 1000-1100 Time Calculation (min): 60 min  Short Term Goals: Week 2:  OT Short Term Goal 1 (Week 2): Pt will demonstrate increased LUE motor control by bathing right arm with min assist OT Short Term Goal 2 (Week 2): Pt will demonstrate improved motor planning skills with donning shirt with mod assist 3/4 times OT Short Term Goal 3 (Week 2): Pt will demonstrate improved motor planning skills with completing LB dressing with mod assist OT Short Term Goal 4 (Week 2): Pt will complete toilet transfer with mod assist 3/4 times OT Short Term Goal 5 (Week 2): Pt will complete toileting with mod assist (2/3 steps)  Skilled Therapeutic Interventions/Progress Updates:    Pt seen for ADL retraining at sink with focus on sit <> stand, dynamic standing balance, increased use of LUE with self-care tasks, and increased participation and attention to dressing tasks. Encouraged pt to complete bathing at shower level, however he was insistent on bathing at sink.  Engaged in bathing at sink to further facilitate initiation, sequencing, and forced use of LUE with bathing and dressing tasks. Hand over hand assist at Lt hand with bathing of Rt arm and assisting with grasp to attempt to pull up pants. Encouraged pt to direct visual attention to Lt hand to increase attention and functional use of LUE. Pt with increased carryover with bathing with requiring fewer cues for initiation and cessation once task completed. Pt required hand over hand assist with pulling up underwear and pants secondary to decreased Lt grasp strength and decreased attention to Lt side. Backwards chaining with attempts to don socks, pt unable to don socks do to decreased attention and noted wheezing when bending forwards. Pt continues to require max cues for sequencing with donning shirt.  Ambulated to  toilet with min assist and cues for safety with RW and attention to Lt when turning to Lt.   Therapy Documentation Precautions:  Precautions Precautions: Fall Precaution Comments: Very limited L knee flexion ROM  Restrictions Weight Bearing Restrictions: No Pain:  Pt with no c/o pain this session.  See FIM for current functional status  Therapy/Group: Individual Therapy  Leonette Monarch 01/09/2012, 12:26 PM

## 2012-01-09 NOTE — Progress Notes (Signed)
Speech Language Pathology Daily Session Note  Patient Details  Name: Brendan Scott MRN: [id number] Date of Birth: [birth year]  Today's Date: 01/09/2012 Time: 9562-1308 Time Calculation (min): 45 min  Short Term Goals: Week 2: SLP Short Term Goal 1 (Week 2): Patient will self-monitor and correct left anterior loss of textures and liquids during p.o. intake with mod assist verbal and question cues. SLP Short Term Goal 2 (Week 2): Patient will utilize swallowing compensatory strategies while consuming Dys.2 textures and thin liquids with min assist verbal cues.  SLP Short Term Goal 3 (Week 2): Patient will demonstrate selective attention to a functional task for 10 minutes with mod assist verbal and question cues for redirection  SLP Short Term Goal 4 (Week 2): Patient will attend to left enviornment/body with mod assist tactile and verbal cues.  SLP Short Term Goal 5 (Week 2): Patient will maintain topic of conversation for ~3 turns with max assist verbal and question cues for redirection. SLP Short Term Goal 6 (Week 2): Patient will identify 1 physical and 1 cognitive deficit with mod assist semantic cues.   Skilled Therapeutic Interventions: Skilled therapy focused on addressing dysphagia and cognition during a meal.  SLP facilitated session with max assist to sequence and organize tray set up with hand over hand assist to break moments of perseveration. Patient consumed Dys.1 textures and thin liquids with max faded to mod assist cues to recall and carryover use of safe swallow compensatory strategies.  As session, progressed SLP facilitated session with max assist cues to maintain arousal.  Patient began to clear throat as a result of decreased arousal SLP suspect's patient began to penetrate liquids and therefore meal was ended.     FIM:  Comprehension Comprehension Mode: Auditory Comprehension: 3-Understands basic 50 - 74% of the time/requires cueing 25 - 50%  of the  time Expression Expression Mode: Verbal Expression: 3-Expresses basic 50 - 74% of the time/requires cueing 25 - 50% of the time. Needs to repeat parts of sentences. Social Interaction Social Interaction: 3-Interacts appropriately 50 - 74% of the time - May be physically or verbally inappropriate. Problem Solving Problem Solving: 2-Solves basic 25 - 49% of the time - needs direction more than half the time to initiate, plan or complete simple activities Memory Memory: 2-Recognizes or recalls 25 - 49% of the time/requires cueing 51 - 75% of the time FIM - Eating Eating Activity: 4: Helper checks for pocketed food  Pain Pain Assessment Pain Assessment: No/denies pain  Therapy/Group: Individual Therapy  Charlane Ferretti., CCC-SLP [phone]  Deniya Craigo 01/09/2012, 1:34 PM

## 2012-01-09 NOTE — Progress Notes (Addendum)
Occupational Therapy Note  Patient Details  Name: Brendan Scott MRN: [id number] Date of Birth: Jan 24, 1946 Today's Date: 01/09/2012  Time: 4132-4401 (cotx with Speech Therapy-group time [id number]) Pt denies pain Group Therapy Pt participated in self feeding group with focus on swallowing strategies, portion control, attention to left, and active participation.  Pt required min verbal cues to attend to left to locate items on left of plate and for portion control.  Pt's wife present and provided assistance PRN and appropriate encouragement throughout session.     Lavone Neri [facility] 01/09/2012, 3:21 PM

## 2012-01-09 NOTE — Progress Notes (Signed)
Patient ID: Brendan Scott, male   DOB: 17-Nov-1945, 66 y.o.   MRN: [id number] Subjective/Complaints:  66 y.o. right-handed male with history of atrial fibrillation on aspirin therapy prior to admission as well as diabetes mellitus. Admitted 12/15/2011 after being found in his driveway by family unresponsive. EMS arrived and noted patient to be slightly confused with left facial droop and dysarthria. MRI of the brain showed a large right middle cerebral artery distribution nonhemorrhagic infarct as well as incidental findings of large left sphenoid wing mass consistent with meningioma. He was not considered a candidate for intravenous thrombolytic therapy with TPA. MRA of the head showed high-grade stenosis proximal basilar artery. Echocardiogram with ejection fraction of 60% and no wall motion abnormalities without emboli. Cerebral angiogram showed no occlusions or stenosis. There was reported seizure 12/16/2011 and patient has been loaded with Keppra /Dilantin as well as vimpat. EEG did not show definite epileptiform discharges. Throughout his hospital course there as been recurrent seizures and has since resolved as of 12/23/2011 Neurology services consulted presently maintained on aspirin therapy as well as subcutaneous Lovenox for DVT prophylaxis. On 11/25 the EKG consistent with atrial fibrillation with cardiac enzymes negative. Coumadin was initiated 12/25/2011 and aspirin was discontinued. He would remain on Lovenox for bridging until INR greater than 2.00. Speech therapy followup for dysphagia and diet has slowly been advanced to a dysphagia 1 pudding thick liquid diet with latest swallow study 12/25/2011. He continued with IV fluids for hydration secondary to pudding thick liquids.  Objective: No c/o no cough or SOB, no seizures Diet upgraded after MBS Pt alert and oriented Review of Systems  Respiratory: Negative for cough and shortness of breath.   Neurological: Positive for focal weakness.  Negative for seizures.  All other systems reviewed and are negative.   Vital Signs: Blood pressure 143/82, pulse 74, temperature 98.2 F (36.8 C), temperature source Oral, resp. rate 19, SpO2 97.00%. No results found. Results for orders placed during the hospital encounter of 12/27/11 (from the past 72 hour(s))  PROTIME-INR     Status: Abnormal   Collection Time   01/06/12  9:14 AM      Component Value Range Comment   Prothrombin Time 25.1 (*) 11.6 - 15.2 seconds    INR 2.41 (*) 0.00 - 1.49   GLUCOSE, CAPILLARY     Status: Abnormal   Collection Time   01/06/12 11:32 AM      Component Value Range Comment   Glucose-Capillary 164 (*) 70 - 99 mg/dL   GLUCOSE, CAPILLARY     Status: Abnormal   Collection Time   01/06/12  4:56 PM      Component Value Range Comment   Glucose-Capillary 101 (*) 70 - 99 mg/dL   GLUCOSE, CAPILLARY     Status: Abnormal   Collection Time   01/06/12  8:43 PM      Component Value Range Comment   Glucose-Capillary 145 (*) 70 - 99 mg/dL    Comment 1 Notify RN     PROTIME-INR     Status: Abnormal   Collection Time   01/07/12  7:15 AM      Component Value Range Comment   Prothrombin Time 25.8 (*) 11.6 - 15.2 seconds    INR 2.50 (*) 0.00 - 1.49   GLUCOSE, CAPILLARY     Status: Abnormal   Collection Time   01/07/12  7:28 AM      Component Value Range Comment   Glucose-Capillary 123 (*) 70 - 99 mg/dL  GLUCOSE, CAPILLARY     Status: Abnormal   Collection Time   01/07/12 12:08 PM      Component Value Range Comment   Glucose-Capillary 162 (*) 70 - 99 mg/dL   GLUCOSE, CAPILLARY     Status: Normal   Collection Time   01/07/12  4:54 PM      Component Value Range Comment   Glucose-Capillary 88  70 - 99 mg/dL   GLUCOSE, CAPILLARY     Status: Abnormal   Collection Time   01/07/12  8:38 PM      Component Value Range Comment   Glucose-Capillary 178 (*) 70 - 99 mg/dL    Comment 1 Notify RN     GLUCOSE, CAPILLARY     Status: Abnormal   Collection Time   01/08/12  7:23  AM      Component Value Range Comment   Glucose-Capillary 125 (*) 70 - 99 mg/dL    Comment 1 Notify RN     PROTIME-INR     Status: Abnormal   Collection Time   01/08/12  8:02 AM      Component Value Range Comment   Prothrombin Time 26.9 (*) 11.6 - 15.2 seconds    INR 2.64 (*) 0.00 - 1.49   BASIC METABOLIC PANEL     Status: Abnormal   Collection Time   01/08/12  8:02 AM      Component Value Range Comment   Sodium 137  135 - 145 mEq/L    Potassium 4.0  3.5 - 5.1 mEq/L    Chloride 102  96 - 112 mEq/L    CO2 27  19 - 32 mEq/L    Glucose, Bld 144 (*) 70 - 99 mg/dL    BUN 10  6 - 23 mg/dL    Creatinine, Ser 1.61  0.50 - 1.35 mg/dL    Calcium 9.7  8.4 - 09.6 mg/dL    GFR calc non Af Amer 69 (*) >90 mL/min    GFR calc Af Amer 80 (*) >90 mL/min   PHENYTOIN LEVEL, TOTAL     Status: Abnormal   Collection Time   01/08/12  8:02 AM      Component Value Range Comment   Phenytoin Lvl 9.8 (*) 10.0 - 20.0 ug/mL   GLUCOSE, CAPILLARY     Status: Abnormal   Collection Time   01/08/12 11:16 AM      Component Value Range Comment   Glucose-Capillary 174 (*) 70 - 99 mg/dL   GLUCOSE, CAPILLARY     Status: Normal   Collection Time   01/08/12  4:16 PM      Component Value Range Comment   Glucose-Capillary 78  70 - 99 mg/dL    Comment 1 Notify RN     GLUCOSE, CAPILLARY     Status: Abnormal   Collection Time   01/08/12  8:30 PM      Component Value Range Comment   Glucose-Capillary 211 (*) 70 - 99 mg/dL    Comment 1 Notify RN     PROTIME-INR     Status: Abnormal   Collection Time   01/09/12  6:20 AM      Component Value Range Comment   Prothrombin Time 23.6 (*) 11.6 - 15.2 seconds    INR 2.21 (*) 0.00 - 1.49   GLUCOSE, CAPILLARY     Status: Abnormal   Collection Time   01/09/12  7:27 AM      Component Value Range Comment   Glucose-Capillary 126 (*)  70 - 99 mg/dL    Comment 1 Notify RN        HEENT: L lower facial droop Cardio: RRR Resp: Wheezes and Clear with cough GI: BS  positive Extremity:  Pulses positive and No Edema Skin:   Intact Neuro: Awake oriented to place, month,situation, Cranial Nerve Abnormalities L central VII, Normal Sensory, Abnormal Motor 0/5 L hand , 3-/5 Bi.tri,delt,HF,KE,ankle DF/PF, Dysarthric, Inattention and Other Left neglect no field cut Musc/Skel:  Normal and Other no shoulder pain no ataxia Still pocketing food  Assessment/Plan: 1. Functional deficits secondary to embolic R MCA infarct with L HP which require 3+ hours per day of interdisciplinary therapy in a comprehensive inpatient rehab setting. Physiatrist is providing close team supervision and 24 hour management of active medical problems listed below. Physiatrist and rehab team continue to assess barriers to discharge/monitor patient progress toward functional and medical goals. FIM: FIM - Bathing Bathing Steps Patient Completed: Chest;Left Arm;Abdomen;Front perineal area;Buttocks;Right upper leg;Left upper leg Bathing: 3: Mod-Patient completes 5-7 67f 10 parts or 50-74%  FIM - Upper Body Dressing/Undressing Upper body dressing/undressing steps patient completed: Thread/unthread right sleeve of pullover shirt/dresss;Put head through opening of pull over shirt/dress Upper body dressing/undressing: 3: Mod-Patient completed 50-74% of tasks FIM - Lower Body Dressing/Undressing Lower body dressing/undressing steps patient completed: Thread/unthread right underwear leg;Thread/unthread left underwear leg;Thread/unthread right pants leg;Thread/unthread left pants leg Lower body dressing/undressing: 2: Max-Patient completed 25-49% of tasks  FIM - Toileting Toileting steps completed by patient: Adjust clothing prior to toileting;Adjust clothing after toileting Toileting Assistive Devices: Grab bar or rail for support Toileting: 1: Two helpers  FIM - Diplomatic Services operational officer Devices: Therapist, music Transfers: 4-To toilet/BSC: Min A (steadying Pt. > 75%);4-From  toilet/BSC: Min A (steadying Pt. > 75%)  FIM - Banker Devices: Walker;Arm rests Bed/Chair Transfer: 5: Sit > Supine: Supervision (verbal cues/safety issues)  FIM - Locomotion: Wheelchair Distance: 120 Locomotion: Wheelchair: 1: Total Assistance/staff pushes wheelchair (Pt<25%) FIM - Locomotion: Ambulation Locomotion: Ambulation Assistive Devices: Designer, industrial/product Ambulation/Gait Assistance: 4: Min assist Locomotion: Ambulation: 2: Travels 50 - 149 ft with minimal assistance (Pt.>75%)  Comprehension Comprehension Mode: Auditory Comprehension: 3-Understands basic 50 - 74% of the time/requires cueing 25 - 50%  of the time  Expression Expression Mode: Verbal Expression: 3-Expresses basic 50 - 74% of the time/requires cueing 25 - 50% of the time. Needs to repeat parts of sentences.  Social Interaction Social Interaction: 3-Interacts appropriately 50 - 74% of the time - May be physically or verbally inappropriate.  Problem Solving Problem Solving: 2-Solves basic 25 - 49% of the time - needs direction more than half the time to initiate, plan or complete simple activities  Memory Memory: 2-Recognizes or recalls 25 - 49% of the time/requires cueing 51 - 75% of the time  Medical Problem List and Plan:  1. 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 DVT Prophylaxis/Anticoagulation: Chronic Coumadin therapy .  Monitor platelet counts any signs of bleeding  3. Neuropsych: This patient is not capable of making decisions on his/her own behalf.  4. Recurrent seizures. EEG was negative. Continue Klonopin 0.5 mg each bedtime, vimpat 300mg  bid,keppra 2500mg  Q12hour, Dilantin 100 mg 3 times a day.Recheck level ask neuro to re eval Monitor closely for a seizure activity.  Neuro reluctant to make changes at this point, seizures were difficult to control. 5. Hypertension/atrial fibrillation of new onset. Lisinopril 5 mg daily.  Cardiac rate is controlled. There's been  no reports of chest pain or shortness of breath   6. Dysphagia. Dysphagia 3 thin liquids D/C IVF 7. Non-insulin-dependent diabetes mellitus: Hemoglobin A1c 6.9.. Patient currently with sliding scale insulin. Patient was taking Glucophage 500 mg daily prior to admission. Sugars controlled  at present.  Cont glucopage at 500mg  qday, check BMET    LOS (Days) 13 A FACE TO FACE EVALUATION WAS PERFORMED  Angles Trevizo E 01/09/2012, 7:51 AM

## 2012-01-10 ENCOUNTER — Encounter (HOSPITAL_COMMUNITY): Payer: BC Managed Care – PPO | Admitting: Occupational Therapy

## 2012-01-10 ENCOUNTER — Inpatient Hospital Stay (HOSPITAL_COMMUNITY): Payer: BC Managed Care – PPO | Admitting: Speech Pathology

## 2012-01-10 ENCOUNTER — Inpatient Hospital Stay (HOSPITAL_COMMUNITY): Payer: BC Managed Care – PPO | Admitting: Physical Therapy

## 2012-01-10 LAB — CBC
HCT: 41.4 % (ref 39.0–52.0)
Platelets: 288 10*3/uL (ref 150–400)
RDW: 15.3 % (ref 11.5–15.5)
WBC: 6.8 10*3/uL (ref 4.0–10.5)

## 2012-01-10 LAB — GLUCOSE, CAPILLARY: Glucose-Capillary: 131 mg/dL — ABNORMAL HIGH (ref 70–99)

## 2012-01-10 LAB — PROTIME-INR: INR: 2.34 — ABNORMAL HIGH (ref 0.00–1.49)

## 2012-01-10 NOTE — Progress Notes (Signed)
Social Work Patient ID: Brendan Scott, male   DOB: 27-Jul-1945, 66 y.o.   MRN: [id number] Met with pt, wife and mother in-law to inform team conference progression toward goals and discharge still 12/24.  Wife really wants pt's seizure meds decreased While here instead of waiting until home.  She wants MD to ask Neuro again.  She is concerned about how lethargic pt is all of the time.  Discussed having grandchildren Come in week of 12/23 to learn pt's care, since they will be there while wife works.  Wife pleased with pt's progress but wants lethargy addressed.  Continue to work on Discharge needs.

## 2012-01-10 NOTE — Progress Notes (Signed)
Speech Language Pathology Daily Session Note  Patient Details  Name: Brendan Scott MRN: [id number] Date of Birth: 04/17/1945  Today's Date: 01/10/2012 Time: 4098-1191 Time Calculation (min): 45 min  Short Term Goals: Week 2: SLP Short Term Goal 1 (Week 2): Patient will self-monitor and correct left anterior loss of textures and liquids during p.o. intake with mod assist verbal and question cues. SLP Short Term Goal 2 (Week 2): Patient will utilize swallowing compensatory strategies while consuming Dys.2 textures and thin liquids with min assist verbal cues.  SLP Short Term Goal 3 (Week 2): Patient will demonstrate selective attention to a functional task for 10 minutes with mod assist verbal and question cues for redirection  SLP Short Term Goal 4 (Week 2): Patient will attend to left enviornment/body with mod assist tactile and verbal cues.  SLP Short Term Goal 5 (Week 2): Patient will maintain topic of conversation for ~3 turns with max assist verbal and question cues for redirection. SLP Short Term Goal 6 (Week 2): Patient will identify 1 physical and 1 cognitive deficit with mod assist semantic cues.   Skilled Therapeutic Interventions: Skilled therapy focused on addressing dysphagia and cognition during a meal. SLP facilitated session with max assist to sequence and organize tray set up with hand over hand assist to break moments of perseveration. Patient consumed Dys.1 textures and thin liquids with mod faded to min assist cues to recall and carryover use of safe swallow compensatory strategies.  SLP facilitated session with direct verbal cues to sustain attention to self feeding for 2-3 bites/sips without cues. Patient with intermittent clear throat/cough; difficult to determine if a result of penetration or carryover of taught strategies.    FIM:  Comprehension Comprehension Mode: Auditory Comprehension: 5-Follows basic conversation/direction: With extra time/assistive  device Expression Expression Mode: Verbal Expression: 5-Expresses basic needs/ideas: With extra time/assistive device Social Interaction Social Interaction: 5-Interacts appropriately 90% of the time - Needs monitoring or encouragement for participation or interaction. Problem Solving Problem Solving: 2-Solves basic 25 - 49% of the time - needs direction more than half the time to initiate, plan or complete simple activities Memory Memory: 4-Recognizes or recalls 75 - 89% of the time/requires cueing 10 - 24% of the time FIM - Eating Eating Activity: 0: Activity did not occur  Pain Pain Assessment Pain Assessment: No/denies pain  Therapy/Group: Individual Therapy  Charlane Ferretti., CCC-SLP [phone]  Averil Digman 01/10/2012, 10:54 AM

## 2012-01-10 NOTE — Progress Notes (Signed)
Recreational Therapy Session Note  Patient Details  Name: Brendan Scott MRN: [id number] Date of Birth: 03/06/1945 Today's Date: 01/10/2012 Time:1305-1400 Pain: no c/o Skilled Therapeutic Interventions/Progress Updates: Patient very lethargic this pm. Wife concerned about patient's arousal, lethargy for D/C home and would like to speak with MD or neurologist about reducing seizure medication in hopes of increasing his arousal/attention. PT passed message to SW.  Co-treat with PT with focus on standing balance, tolerance, selective attention to task, sequencing, and visual scanning to L during card matching task; patient perseverating on suit of card and unable to match numbers; changed to colored blocks and performed colored block matching task with visual scanning to L to find match and use of LUE to pick up and release blocks with mod-max verbal cues for sequencing and to attend to L beginning with step by step cues and progressing to questioning cues. Continued standing balance, selective attention to task and L side during functional task of dust mopping paper towels in floor and then into trash can and then during ambulation back to room with focus on keeping mop on L side of hallway for attention to L and ceasing at his room door with min-mod verbal cues.   Therapy/Group: Co-Treatment Jasie Meleski 01/10/2012, 4:02 PM

## 2012-01-10 NOTE — Progress Notes (Signed)
Speech Language Pathology Daily Session Note  Patient Details  Name: Brendan Scott MRN: [id number] Date of Birth: [birth year]  Today's Date: 01/10/2012 Time: 1130-1200 Time Calculation (min): 30 min  Short Term Goals: Week 2: SLP Short Term Goal 1 (Week 2): Patient will self-monitor and correct left anterior loss of textures and liquids during p.o. intake with mod assist verbal and question cues. SLP Short Term Goal 2 (Week 2): Patient will utilize swallowing compensatory strategies while consuming Dys.2 textures and thin liquids with min assist verbal cues.  SLP Short Term Goal 3 (Week 2): Patient will demonstrate selective attention to a functional task for 10 minutes with mod assist verbal and question cues for redirection  SLP Short Term Goal 4 (Week 2): Patient will attend to left enviornment/body with mod assist tactile and verbal cues.  SLP Short Term Goal 5 (Week 2): Patient will maintain topic of conversation for ~3 turns with max assist verbal and question cues for redirection. SLP Short Term Goal 6 (Week 2): Patient will identify 1 physical and 1 cognitive deficit with mod assist semantic cues.   Skilled Therapeutic Interventions: Group session, co-treatment with OT; SLP focused on addressing dysphagia goals. Patient consumed Dys.1 textures and thin liquids with min assist to self-monitor left pocketing and anterior loss of food during meal.  SLP also facilitated session with Dys.2 trial which increased cuing to max assist to check for pocketing; not yet ready for upgrade.  Patient exhibited intermittent throat clears throughout session and SLP and wife cues patient to cough hard to reduce suspected penetrates.  Continue with current plan of care.   FIM:  Comprehension Comprehension Mode: Auditory Comprehension: 5-Follows basic conversation/direction: With extra time/assistive device Expression Expression Mode: Verbal Expression: 5-Expresses basic needs/ideas: With extra  time/assistive device Social Interaction Social Interaction: 5-Interacts appropriately 90% of the time - Needs monitoring or encouragement for participation or interaction. Problem Solving Problem Solving: 2-Solves basic 25 - 49% of the time - needs direction more than half the time to initiate, plan or complete simple activities Memory Memory: 4-Recognizes or recalls 75 - 89% of the time/requires cueing 10 - 24% of the time FIM - Eating Eating Activity: 4: Helper checks for pocketed food  Pain Pain Assessment Pain Assessment: No/denies pain  Therapy/Group: Group Therapy  Charlane Ferretti., CCC-SLP [phone]  Amela Handley 01/10/2012, 12:46 PM

## 2012-01-10 NOTE — Progress Notes (Deleted)
Pt refused all his PM meds stating wife said not to take it. I explain to pt why he is taking his medications. I call his wife but was unavailable to reach.

## 2012-01-10 NOTE — Progress Notes (Signed)
Occupational Therapy Note  Patient Details  Name: Brendan Scott MRN: [id number] Date of Birth: 1946-01-26 Today's Date: 01/10/2012  Time In:  9:25 Time Out:  10:30.  Individual session.  No c/o pain.  ADL retraining at shower level.  Patient more automatic in shower and would benefit from showering every day for repetition and automaticity.  Emphasis on sequencing, organization, motor planning, switching set, decreasing perseveration.  Patient also more independent with backward chaining with shirt (therapist dressed RUE and then patient able to complete task), and specific task set up to allow patient to compensate for visual perceptual deficits and apraxia.  Grooming at sink with same emphasis.  Patient very motivated to be independent and to use LUE.  Discussed session with wife as well.     Norton Pastel 01/10/2012, 10:28 AM

## 2012-01-10 NOTE — Progress Notes (Signed)
Patient ID: Brendan Scott, male   DOB: 09/23/1945, 66 y.o.   MRN: [id number] Subjective/Complaints:  66 y.o. right-handed male with history of atrial fibrillation on aspirin therapy prior to admission as well as diabetes mellitus. Admitted 12/15/2011 after being found in his driveway by family unresponsive. EMS arrived and noted patient to be slightly confused with left facial droop and dysarthria. MRI of the brain showed a large right middle cerebral artery distribution nonhemorrhagic infarct as well as incidental findings of large left sphenoid wing mass consistent with meningioma. He was not considered a candidate for intravenous thrombolytic therapy with TPA. MRA of the head showed high-grade stenosis proximal basilar artery. Echocardiogram with ejection fraction of 60% and no wall motion abnormalities without emboli. Cerebral angiogram showed no occlusions or stenosis. There was reported seizure 12/16/2011 and patient has been loaded with Keppra /Dilantin as well as vimpat. EEG did not show definite epileptiform discharges. Throughout his hospital course there as been recurrent seizures and has since resolved as of 12/23/2011 Neurology services consulted presently maintained on aspirin therapy as well as subcutaneous Lovenox for DVT prophylaxis. On 11/25 the EKG consistent with atrial fibrillation with cardiac enzymes negative. Coumadin was initiated 12/25/2011 and aspirin was discontinued. He would remain on Lovenox for bridging until INR greater than 2.00. Speech therapy followup for dysphagia and diet has slowly been advanced to a dysphagia 1 pudding thick liquid diet with latest swallow study 12/25/2011. He continued with IV fluids for hydration secondary to pudding thick liquids.  Objective: No c/o no cough or SOB, no seizures Working with SLP on swallow occ throat clearing Pt alert and oriented  Review of Systems  Respiratory: Negative for cough and shortness of breath.   Neurological: Positive  for focal weakness. Negative for seizures.  All other systems reviewed and are negative.   Vital Signs: Blood pressure 111/70, pulse 72, temperature 97.7 F (36.5 C), temperature source Oral, resp. rate 19, height 5\' 6"  (1.676 m), weight 95.255 kg (210 lb), SpO2 99.00%. No results found. Results for orders placed during the hospital encounter of 12/27/11 (from the past 72 hour(s))  GLUCOSE, CAPILLARY     Status: Abnormal   Collection Time   01/07/12 12:08 PM      Component Value Range Comment   Glucose-Capillary 162 (*) 70 - 99 mg/dL   GLUCOSE, CAPILLARY     Status: Normal   Collection Time   01/07/12  4:54 PM      Component Value Range Comment   Glucose-Capillary 88  70 - 99 mg/dL   GLUCOSE, CAPILLARY     Status: Abnormal   Collection Time   01/07/12  8:38 PM      Component Value Range Comment   Glucose-Capillary 178 (*) 70 - 99 mg/dL    Comment 1 Notify RN     GLUCOSE, CAPILLARY     Status: Abnormal   Collection Time   01/08/12  7:23 AM      Component Value Range Comment   Glucose-Capillary 125 (*) 70 - 99 mg/dL    Comment 1 Notify RN     PROTIME-INR     Status: Abnormal   Collection Time   01/08/12  8:02 AM      Component Value Range Comment   Prothrombin Time 26.9 (*) 11.6 - 15.2 seconds    INR 2.64 (*) 0.00 - 1.49   BASIC METABOLIC PANEL     Status: Abnormal   Collection Time   01/08/12  8:02 AM  Component Value Range Comment   Sodium 137  135 - 145 mEq/L    Potassium 4.0  3.5 - 5.1 mEq/L    Chloride 102  96 - 112 mEq/L    CO2 27  19 - 32 mEq/L    Glucose, Bld 144 (*) 70 - 99 mg/dL    BUN 10  6 - 23 mg/dL    Creatinine, Ser 4.40  0.50 - 1.35 mg/dL    Calcium 9.7  8.4 - 34.7 mg/dL    GFR calc non Af Amer 69 (*) >90 mL/min    GFR calc Af Amer 80 (*) >90 mL/min   PHENYTOIN LEVEL, TOTAL     Status: Abnormal   Collection Time   01/08/12  8:02 AM      Component Value Range Comment   Phenytoin Lvl 9.8 (*) 10.0 - 20.0 ug/mL   GLUCOSE, CAPILLARY     Status: Abnormal    Collection Time   01/08/12 11:16 AM      Component Value Range Comment   Glucose-Capillary 174 (*) 70 - 99 mg/dL   GLUCOSE, CAPILLARY     Status: Normal   Collection Time   01/08/12  4:16 PM      Component Value Range Comment   Glucose-Capillary 78  70 - 99 mg/dL    Comment 1 Notify RN     GLUCOSE, CAPILLARY     Status: Abnormal   Collection Time   01/08/12  8:30 PM      Component Value Range Comment   Glucose-Capillary 211 (*) 70 - 99 mg/dL    Comment 1 Notify RN     PROTIME-INR     Status: Abnormal   Collection Time   01/09/12  6:20 AM      Component Value Range Comment   Prothrombin Time 23.6 (*) 11.6 - 15.2 seconds    INR 2.21 (*) 0.00 - 1.49   GLUCOSE, CAPILLARY     Status: Abnormal   Collection Time   01/09/12  7:27 AM      Component Value Range Comment   Glucose-Capillary 126 (*) 70 - 99 mg/dL    Comment 1 Notify RN     GLUCOSE, CAPILLARY     Status: Abnormal   Collection Time   01/09/12 11:32 AM      Component Value Range Comment   Glucose-Capillary 139 (*) 70 - 99 mg/dL    Comment 1 Notify RN     GLUCOSE, CAPILLARY     Status: Abnormal   Collection Time   01/09/12  5:07 PM      Component Value Range Comment   Glucose-Capillary 109 (*) 70 - 99 mg/dL   GLUCOSE, CAPILLARY     Status: Abnormal   Collection Time   01/09/12  9:43 PM      Component Value Range Comment   Glucose-Capillary 145 (*) 70 - 99 mg/dL   PROTIME-INR     Status: Abnormal   Collection Time   01/10/12  5:40 AM      Component Value Range Comment   Prothrombin Time 24.6 (*) 11.6 - 15.2 seconds    INR 2.34 (*) 0.00 - 1.49   CBC     Status: Normal   Collection Time   01/10/12  5:40 AM      Component Value Range Comment   WBC 6.8  4.0 - 10.5 K/uL    RBC 5.08  4.22 - 5.81 MIL/uL    Hemoglobin 13.5  13.0 - 17.0 g/dL  HCT 41.4  39.0 - 52.0 %    MCV 81.5  78.0 - 100.0 fL    MCH 26.6  26.0 - 34.0 pg    MCHC 32.6  30.0 - 36.0 g/dL    RDW 09.8  11.9 - 14.7 %    Platelets 288  150 - 400 K/uL    GLUCOSE, CAPILLARY     Status: Abnormal   Collection Time   01/10/12  7:30 AM      Component Value Range Comment   Glucose-Capillary 131 (*) 70 - 99 mg/dL    Comment 1 Notify RN        HEENT: L lower facial droop Cardio: RRR Resp: Wheezes and Clear with cough GI: BS positive Extremity:  Pulses positive and No Edema Skin:   Intact Neuro: Awake oriented to place, month,situation, Cranial Nerve Abnormalities L central VII, Normal Sensory, Abnormal Motor 0/5 L hand , 3-/5 Bi.tri,delt,HF,KE,ankle DF/PF, Dysarthric, Inattention and Other Left neglect no field cut Musc/Skel:  Normal and Other no shoulder pain no ataxia Still pocketing food  Assessment/Plan: 1. Functional deficits secondary to embolic R MCA infarct with L HP which require 3+ hours per day of interdisciplinary therapy in a comprehensive inpatient rehab setting. Physiatrist is providing close team supervision and 24 hour management of active medical problems listed below. Physiatrist and rehab team continue to assess barriers to discharge/monitor patient progress toward functional and medical goals. FIM: FIM - Bathing Bathing Steps Patient Completed: Chest;Left Arm;Abdomen;Front perineal area;Buttocks;Right upper leg;Left upper leg Bathing: 3: Mod-Patient completes 5-7 63f 10 parts or 50-74%  FIM - Upper Body Dressing/Undressing Upper body dressing/undressing steps patient completed: Thread/unthread right sleeve of pullover shirt/dresss;Put head through opening of pull over shirt/dress Upper body dressing/undressing: 3: Mod-Patient completed 50-74% of tasks FIM - Lower Body Dressing/Undressing Lower body dressing/undressing steps patient completed: Thread/unthread right underwear leg;Thread/unthread left underwear leg;Thread/unthread right pants leg;Thread/unthread left pants leg Lower body dressing/undressing: 2: Max-Patient completed 25-49% of tasks  FIM - Toileting Toileting steps completed by patient: Adjust clothing  prior to toileting;Performs perineal hygiene Toileting Assistive Devices: Grab bar or rail for support Toileting: 3: Mod-Patient completed 2 of 3 steps  FIM - Diplomatic Services operational officer Devices: Grab bars;Walker Toilet Transfers: 4-To toilet/BSC: Min A (steadying Pt. > 75%);4-From toilet/BSC: Min A (steadying Pt. > 75%)  FIM - Banker Devices: Walker;Arm rests Bed/Chair Transfer: 4: Bed > Chair or W/C: Min A (steadying Pt. > 75%);4: Chair or W/C > Bed: Min A (steadying Pt. > 75%)  FIM - Locomotion: Wheelchair Distance: 120 Locomotion: Wheelchair: 0: Activity did not occur FIM - Locomotion: Ambulation Locomotion: Ambulation Assistive Devices: Walker - Rolling;Orthosis Ambulation/Gait Assistance: 5: Supervision;4: Min guard Locomotion: Ambulation: 4: Travels 150 ft or more with minimal assistance (Pt.>75%)  Comprehension Comprehension Mode: Auditory Comprehension: 3-Understands basic 50 - 74% of the time/requires cueing 25 - 50%  of the time  Expression Expression Mode: Verbal Expression: 3-Expresses basic 50 - 74% of the time/requires cueing 25 - 50% of the time. Needs to repeat parts of sentences.  Social Interaction Social Interaction: 3-Interacts appropriately 50 - 74% of the time - May be physically or verbally inappropriate.  Problem Solving Problem Solving: 2-Solves basic 25 - 49% of the time - needs direction more than half the time to initiate, plan or complete simple activities  Memory Memory: 2-Recognizes or recalls 25 - 49% of the time/requires cueing 51 - 75% of the time  Medical Problem List and Plan:  1. 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 DVT Prophylaxis/Anticoagulation: Chronic Coumadin therapy .  Monitor platelet counts any signs of bleeding  3. Neuropsych: This patient is not capable of making decisions on his/her own behalf.  4. Recurrent seizures. EEG was  negative. Continue Klonopin 0.5 mg each bedtime, vimpat 300mg  bid,keppra 2500mg  Q12hour, Dilantin 100 mg 3 times a day.Recheck level ask neuro to re eval Monitor closely for a seizure activity.  Neuro reluctant to make changes at this point, seizures were difficult to control. 5. Hypertension/atrial fibrillation of new onset. Lisinopril 5 mg daily. Cardiac rate is controlled. There's been no reports of chest pain or shortness of breath   6. Dysphagia. Dysphagia 3 thin liquids D/C IVF 7. Non-insulin-dependent diabetes mellitus: Hemoglobin A1c 6.9.. Patient currently with sliding scale insulin. Patient was taking Glucophage 500 mg daily prior to admission. Sugars controlled  at present.  Cont glucopage at 500mg  qday, check BMET    LOS (Days) 14 A FACE TO FACE EVALUATION WAS PERFORMED  Cyril Woodmansee E 01/10/2012, 8:43 AM

## 2012-01-10 NOTE — Progress Notes (Signed)
Pt at first refused all his meds but take them all  later

## 2012-01-10 NOTE — Progress Notes (Signed)
Occupational Therapy Note  Patient Details  Name: Brendan Scott MRN: [id number] Date of Birth: Jun 27, 1945 Today's Date: 01/10/2012  Time: 1200-1220 (cotx with Speech Therapy-group time [id number]) Pt denies pain Group Therapy Pt participated in self feeding group with focus on attention to left, swallowing strategies, portion control, and LUE use to assist with setup and self feeding.  Pt required min verbal cues to perform tongue sweep in left cheek to check for residue.  Pt initiated using LUE to assist but required physical assist to complete task.   Lavone Neri [facility] 01/10/2012, 2:28 PM

## 2012-01-10 NOTE — Progress Notes (Signed)
ANTICOAGULATION CONSULT NOTE - Follow Up Consult  Pharmacy Consult:  Coumadin Indication: atrial fibrillation  No Known Allergies  Patient Measurements: Ht: 66", Wt: 95.5 Kg  Vital Signs: Temp: 97.7 F (36.5 C) (12/11 0500) Temp src: Oral (12/11 0500) BP: 111/70 mmHg (12/11 0500) Pulse Rate: 72  (12/11 0500)  Labs:  Basename 01/10/12 0540 01/09/12 0620 01/08/12 0802  HGB 13.5 -- --  HCT 41.4 -- --  PLT 288 -- --  APTT -- -- --  LABPROT 24.6* 23.6* 26.9*  INR 2.34* 2.21* 2.64*  HEPARINUNFRC -- -- --  CREATININE -- -- 1.09  CKTOTAL -- -- --  CKMB -- -- --  TROPONINI -- -- --    Assessment: 66 y/o male patient with a history of Afib who was on ASA 81 mg PTA and admitted with a stroke, presenting outside of window for TPA.  Patient continues on Coumadin therapy with therapeutic INR.  No bleeding documented. CBC stable. Noted Dilantin therapy could increase the effect of Coumadin.  Noted DPH level 12/9 is 9.8, which corrects to 15.5 mcg/ml (for low alb 2.7). Goal DPH level 10-20 mcg/ml. No seizures noted. Appears DPH dose ok with warfarin dosing.  Goal of Therapy:  INR 2-3    Plan:  - Coumadin 2.5mg  PO daily except 5mg  on Tues / Fri - PT/INR M/W/F   Christoper Fabian, PharmD, BCPS Clinical pharmacist, pager [phone] 01/10/2012, 1:13 PM

## 2012-01-10 NOTE — Progress Notes (Signed)
Physical Therapy Weekly Progress Note  Patient Details  Name: Brendan Scott MRN: [id number] Date of Birth: [birth year]  Today's Date: 01/10/2012 Time: 1300-1400 Time Calculation (min): 60 min  Patient continues to make steady progress and has met 1 of 1 short term goals and 1 out of 10 LTG.  Patient is currently min A overall for bed mobility, bed <> w/c transfers with verbal cues for sequencing, ambulation in controlled environment with RW and min A and stair negotiation with bilat rails with min A but max-total verbal cues for initiation, cessation, redirection, sequencing and safety.  With significant repetition patient able to recall and carryover 50% of functional mobility sequencing.    Patient continues to demonstrate the following deficits: LLE decreased strength and ROM, impaired motor planning/sequencing, apraxia, impaired dynamic standing balance, gait, increased falls risk and multiple cognitive impairments and therefore will continue to benefit from skilled PT intervention to enhance overall performance with balance, postural control, ability to compensate for deficits, functional use of  left upper extremity and left lower extremity, attention, awareness and coordination.  Patient progressing toward long term goals..  Continue plan of care.  PT Short Term Goals Week 2:  PT Short Term Goal 1 (Week 2): Will perform all transfers and gait with min A and 25% cues for initiation, cessaition, sequencing PT Short Term Goal 1 - Progress (Week 2): Met Week 3:  PT Short Term Goal 1 (Week 3): = LTG  Skilled Therapeutic Interventions/Progress Updates:   Patient very lethargic this pm.  Wife concerned about patient's arousal, lethargy for D/C home and would like to speak with MD or neurologist about reducing seizure medication in hopes of increasing his arousal/attention.  Passed message to Child psychotherapist.  Patient performed sit > stand and was able to recall pushing from w/c with LUE and placing  LUE in hand orthosis prior to beginning ambulation; still required verbal and visual cue to strap hand.  Performed gait training with RW x 150' with min guard and verbal cues for guidance with patient attending to cues 100%.  Co-treat with recreation with focus on standing balance, tolerance, selective attention to task, sequencing, and visual scanning to L during card matching task; patient perseverating on suite of card and unable to match numbers; changed to colored blocks and performed colored block matching task with visual scanning to L to find match and use of LUE to pick up and release blocks with mod-max verbal cues for sequencing and to attend to L beginning with step by step cues and progressing to questioning cues.  Continued standing balance, selective attention to task and L side during functional task of dust mopping paper towels in floor and then into trash can and then during ambulation back to room with focus on keeping mop on L side of hallway for attention to L and ceasing at his room door with min-mod verbal cues.  Performed sit > supine with supervision and verbal cues to initiate.    Therapy Documentation Precautions:  Precautions Precautions: Fall Precaution Comments: Very limited L knee flexion ROM  Restrictions Weight Bearing Restrictions: No Pain: Pain Assessment Pain Assessment: No/denies pain Locomotion : Ambulation Ambulation/Gait Assistance: 4: Min assist  See FIM for current functional status  Therapy/Group: Individual Therapy  Edman Circle [facility] 01/10/2012, 2:53 PM

## 2012-01-10 NOTE — Patient Care Conference (Signed)
Inpatient RehabilitationTeam Conference Note Date: 01/10/2012   Time: 10:45 AM    Patient Name: Brendan Scott      Medical Record Number: [id number]  Date of Birth: 1945-10-15 Sex: Male         Room/Bed: 4034/4034-01 Payor Info: Payor: MEDICARE  Plan: MEDICARE PART A AND B  Product Type: *No Product type*     Admitting Diagnosis: RT CVA/SEIZURES AND BRAIN TUMOR  Admit Date/Time:  12/27/2011  6:26 PM Admission Comments: No comment available   Primary Diagnosis:  CVA (cerebral infarction) Principal Problem: CVA (cerebral infarction)  Patient Active Problem List   Diagnosis Date Noted  . CVA (cerebral infarction) 12/28/2011  . Respiratory distress 12/25/2011  . Aspiration into respiratory tract 12/24/2011  . Embolic stroke involving middle cerebral artery 12/21/2011  . Focal motor seizure 12/16/2011  . Pain, knee 10/18/2010  . S/P total knee replacement 10/18/2010  . Diabetes mellitus   . OSTEOARTHRITIS, KNEE, LEFT 07/12/2009  . TOTAL KNEE FOLLOW-UP 08/13/2007  . JOINT EFFUSION, KNEE 06/17/2007  . OSTEOARTHRITIS, LOWER LEG 12/05/2006  . Pain in Joint, Lower Leg 12/05/2006  . SCIATICA, RIGHT 12/05/2006  . DIABETES 10/31/2006    Expected Discharge Date: Expected Discharge Date: 01/23/12  Team Members Present: Physician: Dr. Claudette Laws Social Worker Present: Dossie Der, LCSW Nurse Present: Rosalio Macadamia, RN PT Present: Edman Circle, PT;Other (comment);Wanda Plump, PT (Bridget Ripa-PT) OT Present: Leonette Monarch, Felipa Eth, OT SLP Present: Fae Pippin, SLP Other (Discipline and Name): Charolette Child Coordinator     Current Status/Progress Goal Weekly Team Focus  Medical   Decreased awareness of deficits, dysphagia, little improvement in hand function on the left,Incontinent of bladder  Improve awareness of deficits, work on hand range of motion on left as well as active assisted range of motion  Work on bladder program   Bowel/Bladder   incont of bowel annd  bladder  cont during day with time tioleting  cont during day with time toileting   Swallow/Nutrition/ Hydration   Dys.1 textures and thin liquids  least restrictive p.o. intake   trials of Dys.2 textures   ADL's   min assist bathing, mod assist UB dresing, max assist LB dressing, min-mod assist transfers, max assist toileting  min assist overall, mod assist toileting  safety with transfers and sit <> stand, ADL retraining with focus on initiation, cessation, sequencing, and problem solving, LUE use in self-care tasks taken , education   Mobility   min A overall with RW, still requiring max-total verbal cues for cognition  supervision-min A overall  safety with all mobility, initiation and cessation, balance, gait, attention to L   Communication   min-mod assist   supervision  increase initiation and attention   Safety/Cognition/ Behavioral Observations  mod-max assist   min assist   increase initiation and cessation; increase left attention   Pain   no c/o pain  no c/o pain  monitor   Skin   skin intact   no new skin breakdown  monitor      *See Interdisciplinary Assessment and Plan and progress notes for long and short-term goals  Barriers to Discharge: Wife works    Possible Resolutions to Barriers:  Assess alternate caregivers    Discharge Planning/Teaching Needs:  Home with wife and family members assisting.  Wife is here daily along with other family members, begin learning his care      Team Discussion:  Making good progress, wife here.  Ques regarding decreasing seizure meds-neuro recommends no at  this point.  Wife still concerned about this and pt's lethargy.  Education on-going  Revisions to Treatment Plan:  None   Continued Need for Acute Rehabilitation Level of Care: The patient requires daily medical management by a physician with specialized training in physical medicine and rehabilitation for the following conditions: Daily direction of a multidisciplinary  physical rehabilitation program to ensure safe treatment while eliciting the highest outcome that is of practical value to the patient.: Yes Daily medical management of patient stability for increased activity during participation in an intensive rehabilitation regime.: Yes Daily analysis of laboratory values and/or radiology reports with any subsequent need for medication adjustment of medical intervention for : Neurological problems  [name], Lemar Livings 01/10/2012, 1:35 PM

## 2012-01-11 ENCOUNTER — Inpatient Hospital Stay (HOSPITAL_COMMUNITY): Payer: BC Managed Care – PPO | Admitting: Speech Pathology

## 2012-01-11 ENCOUNTER — Inpatient Hospital Stay (HOSPITAL_COMMUNITY): Payer: BC Managed Care – PPO | Admitting: *Deleted

## 2012-01-11 ENCOUNTER — Inpatient Hospital Stay (HOSPITAL_COMMUNITY): Payer: BC Managed Care – PPO | Admitting: Occupational Therapy

## 2012-01-11 DIAGNOSIS — I6340 Cerebral infarction due to embolism of unspecified cerebral artery: Secondary | ICD-10-CM

## 2012-01-11 DIAGNOSIS — G8110 Spastic hemiplegia affecting unspecified side: Secondary | ICD-10-CM

## 2012-01-11 LAB — GLUCOSE, CAPILLARY
Glucose-Capillary: 135 mg/dL — ABNORMAL HIGH (ref 70–99)
Glucose-Capillary: 154 mg/dL — ABNORMAL HIGH (ref 70–99)
Glucose-Capillary: 146 mg/dL — ABNORMAL HIGH (ref 70–99)

## 2012-01-11 NOTE — Progress Notes (Signed)
Speech Language Pathology Daily Session Note  Patient Details  Name: Brendan Scott MRN: [id number] Date of Birth: 10/08/1945  Today's Date: 01/11/2012 Time: 1130-1200 Time Calculation (min): 30 min  Short Term Goals: Week 2: SLP Short Term Goal 1 (Week 2): Patient will self-monitor and correct left anterior loss of textures and liquids during p.o. intake with mod assist verbal and question cues. SLP Short Term Goal 2 (Week 2): Patient will utilize swallowing compensatory strategies while consuming Dys.2 textures and thin liquids with min assist verbal cues.  SLP Short Term Goal 3 (Week 2): Patient will demonstrate selective attention to a functional task for 10 minutes with mod assist verbal and question cues for redirection  SLP Short Term Goal 4 (Week 2): Patient will attend to left enviornment/body with mod assist tactile and verbal cues.  SLP Short Term Goal 5 (Week 2): Patient will maintain topic of conversation for ~3 turns with max assist verbal and question cues for redirection. SLP Short Term Goal 6 (Week 2): Patient will identify 1 physical and 1 cognitive deficit with mod assist semantic cues.   Skilled Therapeutic Interventions: Group session, co-treatment with OT; SLP focused on addressing dysphagia goals. Patient consumed Dys.1 textures and thin liquids with min assist increased to max assist to self-monitor left pocketing and anterior loss of food during meal and to stop eating when coughing episode occurs.  Patient exhibited increased fatigue to day and as a result exhibited increased throat clears throughout session that progressed to cough at end of session.  SLP educated both patient and wife to stop p.o. when fatigued and coughing on all trials. Continue with current plan of care.   FIM:  Comprehension Comprehension Mode: Auditory Comprehension: 5-Follows basic conversation/direction: With no assist Expression Expression Mode: Verbal Expression: 5-Expresses basic  needs/ideas: With extra time/assistive device Social Interaction Social Interaction: 5-Interacts appropriately 90% of the time - Needs monitoring or encouragement for participation or interaction. Problem Solving Problem Solving: 3-Solves basic 50 - 74% of the time/requires cueing 25 - 49% of the time Memory Memory: 4-Recognizes or recalls 75 - 89% of the time/requires cueing 10 - 24% of the time FIM - Eating Eating Activity: 4: Helper checks for pocketed food  Pain Pain Assessment Pain Assessment: No/denies pain Pain Score: 0-No pain  Therapy/Group: Group Therapy  Brendan Scott., CCC-SLP [phone]  Brendan Scott 01/11/2012, 1:16 PM

## 2012-01-11 NOTE — Progress Notes (Signed)
Occupational Therapy Weekly Progress Note and Treatment Session Note  Patient Details  Name: Brendan Scott MRN: [id number] Date of Birth: 08/21/1945  Today's Date: 01/11/2012 Time: 0930-1030 Time Calculation (min): 60 min  Patient has met 5 of 5 short term goals.  Pt is making steady progress towards goals.  Patient is min assist overall with bathing, mod assist with dressing, and min assist with functional ambulation and transfers.  Pt continues to require mod-max verbal cues for initiation, cessation secondary to perseveration, sequencing, and safety.  Pt highly motivated to improve and regain function in LUE.  Patient continues to demonstrate the following deficits: LUE decreased grasp and ROM, impaired motor planning/sequencing, apraxia, impaired dynamic standing balance, increased falls risk and multiple cognitive impairments (awareness, sequencing, initiation, perseveration, cessation, motor planning, problem solving) and therefore will continue to benefit from skilled OT intervention to enhance overall performance with BADL and Reduce care partner burden.  Patient progressing toward long term goals..  Continue plan of care.  OT Short Term Goals Week 2:  OT Short Term Goal 1 (Week 2): Pt will demonstrate increased LUE motor control by bathing right arm with min assist OT Short Term Goal 1 - Progress (Week 2): Met OT Short Term Goal 2 (Week 2): Pt will demonstrate improved motor planning skills with donning shirt with mod assist 3/4 times OT Short Term Goal 2 - Progress (Week 2): Met OT Short Term Goal 3 (Week 2): Pt will demonstrate improved motor planning skills with completing LB dressing with mod assist OT Short Term Goal 3 - Progress (Week 2): Met OT Short Term Goal 4 (Week 2): Pt will complete toilet transfer with mod assist 3/4 times OT Short Term Goal 4 - Progress (Week 2): Met OT Short Term Goal 5 (Week 2): Pt will complete toileting with mod assist (2/3 steps) OT Short Term  Goal 5 - Progress (Week 2): Met Week 3:  OT Short Term Goal 1 (Week 3): Pt will complete 2 of 4 grooming tasks in standing with min assist. OT Short Term Goal 2 (Week 3): Pt will complete toileting with min assist (3/3 steps with steadying assistance) OT Short Term Goal 3 (Week 3): Pt will demonstrate increased grasp with Lt hand by pulling up pants in standing. OT Short Term Goal 4 (Week 3): Pt will complete UB dressing with min assist 3/4 times. OT Short Term Goal 5 (Week 3): Pt will complete LB dressing with min assist  Skilled Therapeutic Interventions/Progress Updates:    Pt seen for ADL retraining with focus on functional mobility, use of LUE with self-care tasks, sequencing, orientation, motor planning, and decreased perseveration.  Pt ambulated to toilet with RW and min assist with min cues for RW safety and positioning.  Pt doffed pants and completed hygiene with steadying assist in standing.  Pt adamantly refused to complete bathing at shower level.  Pt and wife report that he prefers to "sponge off" at the sink.  Pt demonstrated increased carryover and sequencing with bathing with only initial cue to start.  Pt continues to require hand over hand assist with pulling up pants due to decreased grasp and attention to Lt.  Verbal cues and hand over hand assistance to increase grasp with pulling up pants.  Backward chaining with shirt and donning socks with pt continuing to require mod assist with shirt secondary to perseveration on pulling shirt over head first.    Therapy Documentation Precautions:  Precautions Precautions: Fall Precaution Comments: Very limited L knee flexion  ROM  Restrictions Weight Bearing Restrictions: No Pain: Pain Assessment Pain Assessment: No/denies pain Pain Score: 0-No pain ADL: ADL Grooming: Setup Where Assessed-Grooming: Sitting at sink Upper Body Bathing: Setup Where Assessed-Upper Body Bathing: Sitting at sink;Shower Lower Body Bathing: Minimal  assistance Where Assessed-Lower Body Bathing: Sitting at sink;Standing at sink;Shower Upper Body Dressing: Moderate assistance Where Assessed-Upper Body Dressing: Sitting at sink Lower Body Dressing: Moderate assistance Where Assessed-Lower Body Dressing: Sitting at sink;Standing at sink Toileting: Moderate assistance Where Assessed-Toileting: Teacher, adult education: Curator Method: Ambulating (with RW) Acupuncturist: Psychiatric nurse: Minimal assistance Film/video editor Method: Designer, industrial/product: Shower seat with back  See FIM for current functional status  Therapy/Group: Individual Therapy  Leonette Monarch 01/11/2012, 11:54 AM

## 2012-01-11 NOTE — Progress Notes (Signed)
Recreational Therapy Session Note  Patient Details  Name: PRINCETON NABOR MRN: [id number] Date of Birth: 02-05-1945 Today's Date: 01/11/2012 Time:  1030-11 Pain: no c/o Skilled Therapeutic Interventions/Progress Updates:  Pt sleeping in w/c upon entry.  Pt's wife stating concerns about pt's lethargy/arousal due to prescribed seizure medicines and requesting phone number to Dr Pearlean Brownie.  Information provided per request.  Pt easy to arouse with verbal & tactile cues.  Pt participated in simple leisure tasks seated w/c level with focus on initiation, attention to the left, & LUE use with supervision/mod cues.  Therapy/Group: Individual Therapy  Jacarius Handel 01/11/2012, 5:03 PM

## 2012-01-11 NOTE — Progress Notes (Signed)
Patient ID: Brendan Scott, male   DOB: [birth year], 66 y.o.   MRN: [id number] Subjective/Complaints: Slept well.  Poor awareness of Left  Review of Systems  Neurological: Positive for focal weakness.  All other systems reviewed and are negative.     Objective: Vital Signs: Blood pressure 135/85, pulse 83, temperature 97.8 F (36.6 C), temperature source Oral, resp. rate 18, height 5\' 6"  (1.676 m), weight 90.5 kg (199 lb 8.3 oz), SpO2 100.00%. No results found. Results for orders placed during the hospital encounter of 12/27/11 (from the past 72 hour(s))  GLUCOSE, CAPILLARY     Status: Abnormal   Collection Time   01/08/12 11:16 AM      Component Value Range Comment   Glucose-Capillary 174 (*) 70 - 99 mg/dL   GLUCOSE, CAPILLARY     Status: Normal   Collection Time   01/08/12  4:16 PM      Component Value Range Comment   Glucose-Capillary 78  70 - 99 mg/dL    Comment 1 Notify RN     GLUCOSE, CAPILLARY     Status: Abnormal   Collection Time   01/08/12  8:30 PM      Component Value Range Comment   Glucose-Capillary 211 (*) 70 - 99 mg/dL    Comment 1 Notify RN     PROTIME-INR     Status: Abnormal   Collection Time   01/09/12  6:20 AM      Component Value Range Comment   Prothrombin Time 23.6 (*) 11.6 - 15.2 seconds    INR 2.21 (*) 0.00 - 1.49   GLUCOSE, CAPILLARY     Status: Abnormal   Collection Time   01/09/12  7:27 AM      Component Value Range Comment   Glucose-Capillary 126 (*) 70 - 99 mg/dL    Comment 1 Notify RN     GLUCOSE, CAPILLARY     Status: Abnormal   Collection Time   01/09/12 11:32 AM      Component Value Range Comment   Glucose-Capillary 139 (*) 70 - 99 mg/dL    Comment 1 Notify RN     GLUCOSE, CAPILLARY     Status: Abnormal   Collection Time   01/09/12  5:07 PM      Component Value Range Comment   Glucose-Capillary 109 (*) 70 - 99 mg/dL   GLUCOSE, CAPILLARY     Status: Abnormal   Collection Time   01/09/12  9:43 PM      Component Value Range Comment   Glucose-Capillary 145 (*) 70 - 99 mg/dL   PROTIME-INR     Status: Abnormal   Collection Time   01/10/12  5:40 AM      Component Value Range Comment   Prothrombin Time 24.6 (*) 11.6 - 15.2 seconds    INR 2.34 (*) 0.00 - 1.49   CBC     Status: Normal   Collection Time   01/10/12  5:40 AM      Component Value Range Comment   WBC 6.8  4.0 - 10.5 K/uL    RBC 5.08  4.22 - 5.81 MIL/uL    Hemoglobin 13.5  13.0 - 17.0 g/dL    HCT 69.6  29.5 - 28.4 %    MCV 81.5  78.0 - 100.0 fL    MCH 26.6  26.0 - 34.0 pg    MCHC 32.6  30.0 - 36.0 g/dL    RDW 13.2  44.0 - 10.2 %  Platelets 288  150 - 400 K/uL   GLUCOSE, CAPILLARY     Status: Abnormal   Collection Time   01/10/12  7:30 AM      Component Value Range Comment   Glucose-Capillary 131 (*) 70 - 99 mg/dL    Comment 1 Notify RN     GLUCOSE, CAPILLARY     Status: Abnormal   Collection Time   01/10/12 11:27 AM      Component Value Range Comment   Glucose-Capillary 183 (*) 70 - 99 mg/dL    Comment 1 Notify RN     GLUCOSE, CAPILLARY     Status: Normal   Collection Time   01/10/12  4:06 PM      Component Value Range Comment   Glucose-Capillary 97  70 - 99 mg/dL   GLUCOSE, CAPILLARY     Status: Abnormal   Collection Time   01/10/12  9:40 PM      Component Value Range Comment   Glucose-Capillary 148 (*) 70 - 99 mg/dL    Comment 1 Notify RN     GLUCOSE, CAPILLARY     Status: Abnormal   Collection Time   01/11/12  7:49 AM      Component Value Range Comment   Glucose-Capillary 135 (*) 70 - 99 mg/dL    Comment 1 Notify RN       HEENT: L lower facial droop  Cardio: RRR  Resp: Wheezes and Clear with cough  GI: BS positive  Extremity: Pulses positive and No Edema  Skin: Intact  Neuro: Awake oriented to place, month,situation, Cranial Nerve Abnormalities L central VII, Normal Sensory, Abnormal Motor 0/5 L hand , 3-/5 Bi.tri,delt,HF,KE,ankle DF/PF, Dysarthric, Inattention and Other Left neglect no field cut  Musc/Skel: Normal and Other no  shoulder pain  no ataxia  Still pocketing food     Assessment/Plan: 1. Functional deficits secondary to R MCA infarct which require 3+ hours per day of interdisciplinary therapy in a comprehensive inpatient rehab setting. Physiatrist is providing close team supervision and 24 hour management of active medical problems listed below. Physiatrist and rehab team continue to assess barriers to discharge/monitor patient progress toward functional and medical goals. FIM: FIM - Bathing Bathing Steps Patient Completed: Chest;Right Arm;Left Arm;Abdomen;Front perineal area;Buttocks;Right upper leg;Left upper leg;Right lower leg (including foot) Bathing: 4: Min-Patient completes 8-9 57f 10 parts or 75+ percent (pt more automatic at shower level)  FIM - Upper Body Dressing/Undressing Upper body dressing/undressing steps patient completed: Thread/unthread right sleeve of pullover shirt/dresss;Put head through opening of pull over shirt/dress;Pull shirt over trunk Upper body dressing/undressing: 4: Min-Patient completed 75 plus % of tasks (set up and backward chaining via dressing RUE) FIM - Lower Body Dressing/Undressing Lower body dressing/undressing steps patient completed: Thread/unthread right underwear leg;Thread/unthread left underwear leg;Thread/unthread right pants leg;Thread/unthread left pants leg;Don/Doff right shoe;Don/Doff left shoe (pt with TEDS did 6/10 activities) Lower body dressing/undressing: 3: Mod-Patient completed 50-74% of tasks  FIM - Toileting Toileting steps completed by patient: Adjust clothing prior to toileting;Performs perineal hygiene Toileting Assistive Devices: Grab bar or rail for support Toileting: 0: Activity did not occur  FIM - Diplomatic Services operational officer Devices: Grab bars;Walker Pensions consultant Transfers: 0-Activity did not occur  FIM - Midwife: Environmental consultant;Arm rests Bed/Chair Transfer: 4: Bed > Chair or W/C:  Min A (steadying Pt. > 75%);4: Chair or W/C > Bed: Min A (steadying Pt. > 75%)  FIM - Locomotion: Wheelchair Distance: 120 Locomotion: Wheelchair: 0: Activity did  not occur FIM - Locomotion: Ambulation Locomotion: Ambulation Assistive Devices: Walker - Rolling;Orthosis Ambulation/Gait Assistance: 4: Min assist Locomotion: Ambulation: 4: Travels 150 ft or more with minimal assistance (Pt.>75%)  Comprehension Comprehension Mode: Auditory Comprehension: 5-Follows basic conversation/direction: With no assist  Expression Expression Mode: Verbal Expression: 5-Expresses basic needs/ideas: With extra time/assistive device  Social Interaction Social Interaction: 5-Interacts appropriately 90% of the time - Needs monitoring or encouragement for participation or interaction.  Problem Solving Problem Solving: 5-Solves complex 90% of the time/cues < 10% of the time  Memory Memory: 4-Recognizes or recalls 75 - 89% of the time/requires cueing 10 - 24% of the time  . large right middle sugar artery distribution nonhemorrhagic infarct  2. DVT Prophylaxis/Anticoagulation: Chronic Coumadin therapy with INR 1.77 12/27/2011. Continue Lovenox 40 mg daily  Until INR 2.00. Monitor platelet counts any signs of bleeding  3. Neuropsych: This patient is not capable of making decisions on his/her own behalf.  4. Recurrent seizures. EEG was negative. Continue Klonopin 0.5 mg each bedtime, vimpat 300mg  bid,keppra 2500mg  Q12hour, Dilantin 100 mg 3 times a day. Monitor closely for a seizure activity  5. Hypertension/atrial fibrillation of new onset. Lisinopril 5 mg daily. Cardiac rate is controlled. There's been no reports of chest pain or shortness of breath  6. Proteus UTI. Intravenous Rocephin 1 g daily x7 days initiated 12/25/2011  7. Dysphagia. Dysphagia 1 pudding thick liquids. Continue intravenous fluids for hydration  8. Non-insulin-dependent diabetes mellitus: Hemoglobin A1c 6.9.. Patient currently with  sliding scale insulin.    LOS (Days) 15 A FACE TO FACE EVALUATION WAS PERFORMED  Camp Gopal E 01/11/2012, 8:39 AM

## 2012-01-11 NOTE — Progress Notes (Signed)
Speech Language Pathology Daily Session Note  Patient Details  Name: Brendan Scott MRN: [id number] Date of Birth: 07-16-1945  Today's Date: 01/11/2012 Time: 4098-1191 Time Calculation (min): 45 min  Short Term Goals: Week 2: SLP Short Term Goal 1 (Week 2): Patient will self-monitor and correct left anterior loss of textures and liquids during p.o. intake with mod assist verbal and question cues. SLP Short Term Goal 2 (Week 2): Patient will utilize swallowing compensatory strategies while consuming Dys.2 textures and thin liquids with min assist verbal cues.  SLP Short Term Goal 3 (Week 2): Patient will demonstrate selective attention to a functional task for 10 minutes with mod assist verbal and question cues for redirection  SLP Short Term Goal 4 (Week 2): Patient will attend to left enviornment/body with mod assist tactile and verbal cues.  SLP Short Term Goal 5 (Week 2): Patient will maintain topic of conversation for ~3 turns with max assist verbal and question cues for redirection. SLP Short Term Goal 6 (Week 2): Patient will identify 1 physical and 1 cognitive deficit with mod assist semantic cues.   Skilled Therapeutic Interventions: Skilled therapy focused on addressing dysphagia and cognition during a meal. SLP facilitated session with total assist to sequence and organize tray set up to allow increased time to be spent on self-feeding.  Patient consumed Dys.1 textures and thin liquids with min faded to supervision level assist cues to recall and carryover use of safe swallow compensatory strategies. SLP facilitated session with direct verbal cues to sustain attention to self-feeding for 2-3 bites/sips without cues, which was faded to cues every 4-5 minutes.  Patient was able to consume entire meal in with intermittent clear throat/cough; difficult to determine if a result of penetration or carryover of taught strategies.  SLP also facilitated session with reading schedule with  mod assist verbal and visual cues to scan left.    FIM:  Comprehension Comprehension Mode: Auditory Comprehension: 5-Follows basic conversation/direction: With no assist Expression Expression Mode: Verbal Expression: 5-Expresses basic needs/ideas: With extra time/assistive device Social Interaction Social Interaction: 5-Interacts appropriately 90% of the time - Needs monitoring or encouragement for participation or interaction. Problem Solving Problem Solving: 3-Solves basic 50 - 74% of the time/requires cueing 25 - 49% of the time Memory Memory: 4-Recognizes or recalls 75 - 89% of the time/requires cueing 10 - 24% of the time  Pain Pain Assessment Pain Assessment: No/denies pain  Therapy/Group: Individual Therapy  Charlane Ferretti., CCC-SLP [phone]  [name] 01/11/2012, 11:04 AM

## 2012-01-11 NOTE — Progress Notes (Signed)
Physical Therapy Session Note  Patient Details  Name: Brendan Scott MRN: [id number] Date of Birth: 03-08-1945  Today's Date: 01/11/2012 Time: 14:05-15:05 ( )   Short Term Goals: Week 2:  PT Short Term Goal 1 (Week 2): Will perform all transfers and gait with min A and 25% cues for initiation, cessaition, sequencing PT Short Term Goal 1 - Progress (Week 2): Met  Skilled Therapeutic Interventions/Progress Updates:  Tx focused on safety with all transfers and mobility, gait with RW, and standing balance tasks with attention to L side.  Pt up in chair upon arrival. Told pt to prepare for standing to walk, and asked what he needed to do first, and he attempted to stand before locking WC or moving L leg rest.  Sit<>stand x4 throughout tx chair/mat <>RW with Min A only and 50% cues for hand placement and removing hand from orthosis.   Gait training 1x150' with RW and Min A, primarily for holding L hand on grip as pt with little awareness of it slipping off due to increased weightbearing on it. Pt challenged to read room numbers and signs on L side of hall. Pt had no collisions on L side and was able to sustain attention to task despite distractions in hall. Pt ambulated short distances in gym mirror<>mat, and had great difficulty managing L hand orthosis strap even with verbal and visual cues. Pt perseverating on L grip, but unable to safely incorporate in transfers and walking.  Trials hemi-walker again with pt 1x150' with Min A for steadying and improved posture than with RW. May be less distracting option for pt if providing enough support for balance.   Performed static standing at mirror for pt to choose colored marker on L and draw specified objects on L side, alternating colors and objects. Pt needing assistance to grab marker with L hand and would then pass it to R hand. Pt needs assistance grabbing paper towel with L hand, but able to wash mirror with L hand independently. Pt able to stop  drawing each object, switch colors,and erase easily with 1-2 verbal cues only. Pt stood 2x71min with close SBA only on firm surface without UE assist, then on compliant airex pad with 1 UE assist and close SBA. Pt needing seated rest break between trials.   Stair training with Min A and bil rails x5 with cues for sequence.   Pt left in WC with lap belt and call bell in lap, all needs in reach and nursing aware.      Therapy Documentation Precautions:  Precautions Precautions: Fall Precaution Comments: Very limited L knee flexion ROM  Restrictions Weight Bearing Restrictions: No Pain: Pain Assessment Pain Assessment: No/denies pain Pain Score: 0-No pain Locomotion : Ambulation Ambulation/Gait Assistance: 4: Min assist   See FIM for current functional status  Therapy/Group: Individual Therapy  Clydene Laming, PT, DPT   01/11/2012, 2:25 PM

## 2012-01-12 ENCOUNTER — Inpatient Hospital Stay (HOSPITAL_COMMUNITY): Payer: BC Managed Care – PPO | Admitting: Speech Pathology

## 2012-01-12 ENCOUNTER — Inpatient Hospital Stay (HOSPITAL_COMMUNITY): Payer: BC Managed Care – PPO | Admitting: Physical Therapy

## 2012-01-12 ENCOUNTER — Inpatient Hospital Stay (HOSPITAL_COMMUNITY): Payer: BC Managed Care – PPO | Admitting: Occupational Therapy

## 2012-01-12 LAB — GLUCOSE, CAPILLARY
Glucose-Capillary: 104 mg/dL — ABNORMAL HIGH (ref 70–99)
Glucose-Capillary: 159 mg/dL — ABNORMAL HIGH (ref 70–99)

## 2012-01-12 NOTE — Progress Notes (Signed)
Occupational Therapy Session Note  Patient Details  Name: YUSIF GNAU MRN: [id number] Date of Birth: 02-May-1945  Today's Date: 01/11/2012 Time: 1200-1230 30 min   Skilled Therapeutic Interventions/Progress Updates:   Patient seen today in [employer] to address safety and visual attention during a self feeding activity.  Patient's wife attended group today and assisted with cueing patient to slow down rate of intake, and also to reduce bite / sip size.  Patient required constant cueing, and at times, food had to be maneuvered away from patient.    Therapy Documentation   V Pain: Pain Assessment Pain Assessment: No/denies pain  See FIM for current functional status  Therapy/Group: Group Therapy  Collier Salina 01/12/2012, 5:21 PM

## 2012-01-12 NOTE — Progress Notes (Signed)
Physical Therapy Session Note  Patient Details  Name: Brendan Scott MRN: [id number] Date of Birth: November 19, 1945  Today's Date: 01/12/2012 Time: 7829-5621 Time Calculation (min): 55 min  Short Term Goals: Week 2:  PT Short Term Goal 1 (Week 2): Will perform all transfers and gait with min A and 25% cues for initiation, cessaition, sequencing PT Short Term Goal 1 - Progress (Week 2): Met  Skilled Therapeutic Interventions/Progress Updates:   Patient in bed resting; patient had not eaten lunch secondary to aspiration episode and lethargy.  Patient requesting to eat a little before beginning activity.  Performed supine > sit with supervision and transfer to w/c with min HHA.  Patient able to verbalize swallowing precautions and strategies.  Assisted patient with eating lunch with utensils and plate on L side of tray to facilitate attention to and visual scanning to L with intermittent cues to initiate taking bites, to check L side for pocketing or to monitor number and size of bites.  Patient performed gait in controlled environment x 250' x 2 reps with R hemi walker and min A with verbal cues for guidance and obstacle negotiation with hemi walker and one episode of redirecting patient when he had passed his room on L.  Had discussion with patient about transportation after D/C; patient still with poor awareness of deficits and feels he will be driving as PTA; when verbalized to patient that MD will not allow him to drive patient perseverated on entering car from driver's side.  After multiple verbal cues to redirect patient verbalized and entered car from passenger's side with min A to find handle on door.  Once inside patient slid across to driver's side.  Again redirected patient to the fact that he would not be allowed to drive and cued him to return to passenger side.  Patient exited car with supervision and verbal cue to initiate.  Returned to room and placed in w/c with quick release belt and call bell  within reach.    Therapy Documentation Precautions:  Precautions Precautions: Fall Precaution Comments: Very limited L knee flexion ROM  Restrictions Weight Bearing Restrictions: No Vital Signs: Therapy Vitals Temp: 97.3 F (36.3 C) Temp src: Oral Pulse Rate: 71  Resp: 18  BP: 122/80 mmHg Patient Position, if appropriate: Lying Oxygen Therapy SpO2: 100 % O2 Device: None (Room air) Pain: Pain Assessment Pain Assessment: No/denies pain Locomotion : Ambulation Ambulation/Gait Assistance: 4: Min assist   See FIM for current functional status  Therapy/Group: Individual Therapy  Edman Circle [facility] 01/12/2012, 5:04 PM

## 2012-01-12 NOTE — Progress Notes (Signed)
Occupational Therapy Session Note  Patient Details  Name: Brendan Scott MRN: [id number] Date of Birth: July 27, 1945  Today's Date: 01/12/2012 Time: 0930-1030 Time Calculation (min): 60 min  Short Term Goals: Week 3:  OT Short Term Goal 1 (Week 3): Pt will complete 2 of 4 grooming tasks in standing with min assist. OT Short Term Goal 2 (Week 3): Pt will complete toileting with min assist (3/3 steps with steadying assistance) OT Short Term Goal 3 (Week 3): Pt will demonstrate increased grasp with Lt hand by pulling up pants in standing. OT Short Term Goal 4 (Week 3): Pt will complete UB dressing with min assist 3/4 times. OT Short Term Goal 5 (Week 3): Pt will complete LB dressing with min assist  Skilled Therapeutic Interventions/Progress Updates:    Pt seen for ADL retraining at sink with focus on increased sequencing and self-directed care with bathing and dressing.  Pt and wife reports that he would bathe at sink at home and rarely took a shower, so shower goal may be d/c.  Pt with increased sequencing with bathing and spontaneous use of LUE to attempt to wash RUE and LLE.  Pt with multiple drops of washcloth, yet highly motivated.  Ambulated to toilet with min assist and tactile cues at Lt hand to attempt to increase attention to and grasp on RW.  Physical assistance provided with hand over hand assist to increase grasp and release with pulling up pants at toilet and post bathing.  Pt reports wanting to increase Lt grasp.  Encouraged pt to use Lt hand to grasp wash cloth to wipe face and encouraged him to perform this throughout the day as he tends to drool.  Pt demonstrated this with increased time and gross grasp.  Therapy Documentation Precautions:  Precautions Precautions: Fall Precaution Comments: Very limited L knee flexion ROM  Restrictions Weight Bearing Restrictions: No General:   Vital Signs: Therapy Vitals BP: 103/55 mmHg Pain: Pain Assessment Pain Assessment: No/denies  pain  See FIM for current functional status  Therapy/Group: Individual Therapy  Leonette Monarch 01/12/2012, 10:30 AM

## 2012-01-12 NOTE — Progress Notes (Signed)
Speech Language Pathology Daily Session Note  Patient Details  Name: SANJIV CASTORENA MRN: [id number] Date of Birth: 1945-10-09  Today's Date: 01/12/2012 Time: 4098-1191 Time Calculation (min): 30 min  Short Term Goals: Week 3: SLP Short Term Goal 1 (Week 3): Patient will self-monitor and correct left anterior loss of textures and liquids during p.o. intake with mod assist verbal and question cues. SLP Short Term Goal 2 (Week 3): Patient will utilize swallowing compensatory strategies while consuming Dys.2 textures and thin liquids with mod assist verbal cues. SLP Short Term Goal 3 (Week 3): Patient will demonstrate selective attention to a functional task for 10 minutes with mod assist verbal and question cues for redirection. SLP Short Term Goal 4 (Week 3): Patient will attend to left enviornment/body with mod assist tactile and verbal cues. SLP Short Term Goal 5 (Week 3): Patient will maintain topic of conversation for ~3 turns with max assist verbal and question cues for redirection. SLP Short Term Goal 6 (Week 3): Patient will identify request help as neeed with mod assist verbal cues.   Skilled Therapeutic Interventions: Group session, co-treatment with OT; SLP focused on addressing dysphagia goals. Patient consumed 1 bite of Dys.1 textures and exhibited significant coughing episode.  Patient orally expectorated oral/pharyngeal secretions that were thick but clear in color.  SLP recommended to wait to eat lunch due to continued coughing and assist with removing the rest of secretions via suctioning.    Pain Pain Assessment Pain Assessment: No/denies pain  Therapy/Group: Group Therapy  Charlane Ferretti., CCC-SLP [phone]  Janeah Kovacich 01/12/2012, 12:59 PM

## 2012-01-12 NOTE — Progress Notes (Signed)
ANTICOAGULATION CONSULT NOTE - Follow Up Consult Pharmacy Consult:  Coumadin Indication: atrial fibrillation  No Known Allergies  Patient Measurements: Ht: 66", Wt: 95.5 Kg  Vital Signs: Temp: 98.1 F (36.7 C) (12/13 0500) Temp src: Oral (12/13 0500) BP: 103/55 mmHg (12/13 0931) Pulse Rate: 80  (12/13 0500)  Labs:  Basename 01/12/12 0615 01/10/12 0540  HGB -- 13.5  HCT -- 41.4  PLT -- 288  APTT -- --  LABPROT 23.9* 24.6*  INR 2.25* 2.34*  HEPARINUNFRC -- --  CREATININE -- --  CKTOTAL -- --  CKMB -- --  TROPONINI -- --   Assessment: 66 y/o male patient with a history of Afib who was on ASA 81 mg PTA and admitted with a stroke, presenting outside of window for TPA.  Patient continues on Coumadin therapy with therapeutic INR.  No bleeding documented. CBC stable. Noted Dilantin therapy could increase the effect of Coumadin.  He continues on Keppra as well.  He is off Lovenox with therapeutic INR.  Goal of Therapy:  INR 2-3   Plan:  - Coumadin 2.5mg  PO daily except 5mg  on Tues / Fri - PT/INR M/W/F  Nadara Mustard, PharmD., MS Clinical Pharmacist Pager:  [phone] Thank you for allowing pharmacy to be part of this patients care team. 01/12/2012, 10:51 AM

## 2012-01-12 NOTE — Progress Notes (Addendum)
Speech Language Pathology Daily Session Note & Weekly Progress Summary   Patient Details  Name: Brendan Scott MRN: [id number] Date of Birth: 1945/04/17  Today's Date: 01/12/2012 Time: 7829-5621 Time Calculation (min): 40 min  Short Term Goals: Week 2: SLP Short Term Goal 1 (Week 2): Patient will self-monitor and correct left anterior loss of textures and liquids during p.o. intake with mod assist verbal and question cues. SLP Short Term Goal 2 (Week 2): Patient will utilize swallowing compensatory strategies while consuming Dys.2 textures and thin liquids with min assist verbal cues.  SLP Short Term Goal 3 (Week 2): Patient will demonstrate selective attention to a functional task for 10 minutes with mod assist verbal and question cues for redirection  SLP Short Term Goal 4 (Week 2): Patient will attend to left enviornment/body with mod assist tactile and verbal cues.  SLP Short Term Goal 5 (Week 2): Patient will maintain topic of conversation for ~3 turns with max assist verbal and question cues for redirection. SLP Short Term Goal 6 (Week 2): Patient will identify 1 physical and 1 cognitive deficit with mod assist semantic cues.   Skilled Therapeutic Interventions: Skilled therapy focused on addressing dysphagia and cognition during a meal. SLP facilitated session with mod-max assist to sequence bed to chair transfer, total assist to break perseveration on putting socks on and off with total hand over and assist, and total assist to sequence and organize tray set up to allow increased time to be spent on self-feeding. Patient consumed Dys.1 textures and thin liquids with min faded to supervision level assist cues to recall and carryover use of safe swallow compensatory strategies. SLP facilitated session with direct verbal cues to sustain attention to self-feeding for 2 bites/sips without cues, which was faded to cues every 3-4 minutes. Patient was able to consume entire meal in with  intermittent clear throat/cough; difficult to determine if a result of penetration or carryover of taught strategies. SLP also facilitated session with reading schedule with mod assist verbal and visual cues to scan left.   FIM:  Comprehension Comprehension Mode: Auditory Comprehension: 3-Understands basic 50 - 74% of the time/requires cueing 25 - 50%  of the time Expression Expression Mode: Verbal Expression: 5-Expresses basic 90% of the time/requires cueing < 10% of the time. Social Interaction Social Interaction: 5-Interacts appropriately 90% of the time - Needs monitoring or encouragement for participation or interaction. Problem Solving Problem Solving: 2-Solves basic 25 - 49% of the time - needs direction more than half the time to initiate, plan or complete simple activities Memory Memory: 3-Recognizes or recalls 50 - 74% of the time/requires cueing 25 - 49% of the time FIM - Eating Eating Activity: 5: Supervision/cues;5: Set-up assist for open containers  Pain Pain Assessment Pain Assessment: No/denies pain  Therapy/Group: Individual Therapy   Speech Language Pathology Weekly Progress Note  Patient Details  Name: Brendan Scott MRN: [id number] Date of Birth: 1945/05/30  Today's Date: 01/12/2012  Short Term Goals: Week 2: SLP Short Term Goal 1 (Week 2): Patient will self-monitor and correct left anterior loss of textures and liquids during p.o. intake with mod assist verbal and question cues. SLP Short Term Goal 1 - Progress (Week 2): Progressing toward goal SLP Short Term Goal 2 (Week 2): Patient will utilize swallowing compensatory strategies while consuming Dys.2 textures and thin liquids with min assist verbal cues.  SLP Short Term Goal 2 - Progress (Week 2): Progressing toward goal SLP Short Term Goal 3 (Week 2): Patient  will demonstrate selective attention to a functional task for 10 minutes with mod assist verbal and question cues for redirection  SLP Short Term Goal  3 - Progress (Week 2): Progressing toward goal SLP Short Term Goal 4 (Week 2): Patient will attend to left enviornment/body with mod assist tactile and verbal cues.  SLP Short Term Goal 4 - Progress (Week 2): Progressing toward goal SLP Short Term Goal 5 (Week 2): Patient will maintain topic of conversation for ~3 turns with max assist verbal and question cues for redirection. SLP Short Term Goal 5 - Progress (Week 2): Progressing toward goal SLP Short Term Goal 6 (Week 2): Patient will identify 1 physical and 1 cognitive deficit with mod assist semantic cues.  SLP Short Term Goal 6 - Progress (Week 2): Met Week 3: SLP Short Term Goal 1 (Week 3): Patient will self-monitor and correct left anterior loss of textures and liquids during p.o. intake with mod assist verbal and question cues. SLP Short Term Goal 2 (Week 3): Patient will utilize swallowing compensatory strategies while consuming Dys.2 textures and thin liquids with mod assist verbal cues. SLP Short Term Goal 3 (Week 3): Patient will demonstrate selective attention to a functional task for 10 minutes with mod assist verbal and question cues for redirection. SLP Short Term Goal 4 (Week 3): Patient will attend to left enviornment/body with mod assist tactile and verbal cues. SLP Short Term Goal 5 (Week 3): Patient will maintain topic of conversation for ~3 turns with max assist verbal and question cues for redirection. SLP Short Term Goal 6 (Week 3): Patient will identify request help as neeed with mod assist verbal cues.   Weekly Progress Updates: Patient met 1 out of 6 short term objectives this reporting period with gains in intellectual awareness of deficits post CVA.  A majority of therapy sessions have focused on attention to self-feeding and carryover of strategies and as a result patient has made gains in these areas however not yet functional gains.  Patient continues to consume Dys.1 textures diet with thin liquids with full  supervision due to variable arousal and attention which impact his safety during self-feeding.  SLP suspects that with his increased intellectual awareness and arousal patient will increase his self-monitoring and correcting of his perseverative deficits. As a result, it is recommended that this patient continue to receive skilled SLP services to address above deficits to reduce burden of care upon discharge.   SLP Intensity: Minumum of 1-2 x/day, 30 to 90 minutes SLP Frequency: 5 out of 7 days SLP Duration/Estimated Length of Stay: 2 weeks SLP Treatment/Interventions: Cognitive remediation/compensation;Cueing hierarchy;Dysphagia/aspiration precaution training;Environmental controls;Functional tasks;Internal/external aids;Patient/family education;Speech/Language facilitation;Therapeutic Activities;Therapeutic Exercise  Charlane Ferretti., CCC-SLP [phone]  Decarla Siemen 01/12/2012, 8:59 AM

## 2012-01-12 NOTE — Progress Notes (Signed)
Patient ID: Brendan Scott, male   DOB: [birth year], 66 y.o.   MRN: [id number] Subjective/Complaints: Slept well. Occ cough  Review of Systems  Neurological: Positive for focal weakness.  All other systems reviewed and are negative.     Objective: Vital Signs: Blood pressure 124/81, pulse 80, temperature 98.1 F (36.7 C), temperature source Oral, resp. rate 18, height 5\' 6"  (1.676 m), weight 90.5 kg (199 lb 8.3 oz), SpO2 100.00%. No results found. Results for orders placed during the hospital encounter of 12/27/11 (from the past 72 hour(s))  GLUCOSE, CAPILLARY     Status: Abnormal   Collection Time   01/09/12 11:32 AM      Component Value Range Comment   Glucose-Capillary 139 (*) 70 - 99 mg/dL    Comment 1 Notify RN     GLUCOSE, CAPILLARY     Status: Abnormal   Collection Time   01/09/12  5:07 PM      Component Value Range Comment   Glucose-Capillary 109 (*) 70 - 99 mg/dL   GLUCOSE, CAPILLARY     Status: Abnormal   Collection Time   01/09/12  9:43 PM      Component Value Range Comment   Glucose-Capillary 145 (*) 70 - 99 mg/dL   PROTIME-INR     Status: Abnormal   Collection Time   01/10/12  5:40 AM      Component Value Range Comment   Prothrombin Time 24.6 (*) 11.6 - 15.2 seconds    INR 2.34 (*) 0.00 - 1.49   CBC     Status: Normal   Collection Time   01/10/12  5:40 AM      Component Value Range Comment   WBC 6.8  4.0 - 10.5 K/uL    RBC 5.08  4.22 - 5.81 MIL/uL    Hemoglobin 13.5  13.0 - 17.0 g/dL    HCT 40.9  81.1 - 91.4 %    MCV 81.5  78.0 - 100.0 fL    MCH 26.6  26.0 - 34.0 pg    MCHC 32.6  30.0 - 36.0 g/dL    RDW 78.2  95.6 - 21.3 %    Platelets 288  150 - 400 K/uL   GLUCOSE, CAPILLARY     Status: Abnormal   Collection Time   01/10/12  7:30 AM      Component Value Range Comment   Glucose-Capillary 131 (*) 70 - 99 mg/dL    Comment 1 Notify RN     GLUCOSE, CAPILLARY     Status: Abnormal   Collection Time   01/10/12 11:27 AM      Component Value Range Comment    Glucose-Capillary 183 (*) 70 - 99 mg/dL    Comment 1 Notify RN     GLUCOSE, CAPILLARY     Status: Normal   Collection Time   01/10/12  4:06 PM      Component Value Range Comment   Glucose-Capillary 97  70 - 99 mg/dL   GLUCOSE, CAPILLARY     Status: Abnormal   Collection Time   01/10/12  9:40 PM      Component Value Range Comment   Glucose-Capillary 148 (*) 70 - 99 mg/dL    Comment 1 Notify RN     GLUCOSE, CAPILLARY     Status: Abnormal   Collection Time   01/11/12  7:49 AM      Component Value Range Comment   Glucose-Capillary 135 (*) 70 - 99 mg/dL    Comment  1 Notify RN     GLUCOSE, CAPILLARY     Status: Abnormal   Collection Time   01/11/12 11:35 AM      Component Value Range Comment   Glucose-Capillary 154 (*) 70 - 99 mg/dL    Comment 1 Notify RN     GLUCOSE, CAPILLARY     Status: Abnormal   Collection Time   01/11/12  4:39 PM      Component Value Range Comment   Glucose-Capillary 146 (*) 70 - 99 mg/dL   GLUCOSE, CAPILLARY     Status: Abnormal   Collection Time   01/11/12  9:17 PM      Component Value Range Comment   Glucose-Capillary 120 (*) 70 - 99 mg/dL    Comment 1 Notify RN     PROTIME-INR     Status: Abnormal   Collection Time   01/12/12  6:15 AM      Component Value Range Comment   Prothrombin Time 23.9 (*) 11.6 - 15.2 seconds    INR 2.25 (*) 0.00 - 1.49   GLUCOSE, CAPILLARY     Status: Abnormal   Collection Time   01/12/12  7:27 AM      Component Value Range Comment   Glucose-Capillary 139 (*) 70 - 99 mg/dL     HEENT: L lower facial droop  Cardio: RRR  Resp: Clear to auscultation GI: BS positive  Extremity: Pulses positive and No Edema  Skin: Intact  Neuro: Awake oriented to place, month,situation, Cranial Nerve Abnormalities L central VII, Normal Sensory, Abnormal Motor 0/5 L hand , 3-/5 Bi.tri,delt,HF,KE,ankle DF/PF, Dysarthric, Inattention and Other Left neglect no field cut  Musc/Skel: Normal and Other no shoulder pain  no ataxia  Still  pocketing food     Assessment/Plan: 1. Functional deficits secondary to R MCA infarct which require 3+ hours per day of interdisciplinary therapy in a comprehensive inpatient rehab setting. Physiatrist is providing close team supervision and 24 hour management of active medical problems listed below. Physiatrist and rehab team continue to assess barriers to discharge/monitor patient progress toward functional and medical goals. FIM: FIM - Bathing Bathing Steps Patient Completed: Chest;Right Arm;Left Arm;Abdomen;Front perineal area;Buttocks;Right upper leg;Left upper leg;Right lower leg (including foot);Left lower leg (including foot) Bathing: 4: Steadying assist  FIM - Upper Body Dressing/Undressing Upper body dressing/undressing steps patient completed: Thread/unthread right sleeve of pullover shirt/dresss;Put head through opening of pull over shirt/dress;Pull shirt over trunk Upper body dressing/undressing: 3: Mod-Patient completed 50-74% of tasks FIM - Lower Body Dressing/Undressing Lower body dressing/undressing steps patient completed: Thread/unthread right underwear leg;Thread/unthread left underwear leg;Thread/unthread right pants leg;Thread/unthread left pants leg;Don/Doff right shoe;Don/Doff left shoe Lower body dressing/undressing: 3: Mod-Patient completed 50-74% of tasks  FIM - Toileting Toileting steps completed by patient: Adjust clothing prior to toileting;Performs perineal hygiene Toileting Assistive Devices: Grab bar or rail for support Toileting: 3: Mod-Patient completed 2 of 3 steps  FIM - Diplomatic Services operational officer Devices: Grab bars;Walker Toilet Transfers: 4-To toilet/BSC: Min A (steadying Pt. > 75%);4-From toilet/BSC: Min A (steadying Pt. > 75%)  FIM - Banker Devices: Walker;Arm rests Bed/Chair Transfer: 4: Bed > Chair or W/C: Min A (steadying Pt. > 75%);4: Chair or W/C > Bed: Min A (steadying Pt. >  75%)  FIM - Locomotion: Wheelchair Distance: 120 Locomotion: Wheelchair: 0: Activity did not occur FIM - Locomotion: Ambulation Locomotion: Ambulation Assistive Devices: Walker - Rolling;Orthosis Ambulation/Gait Assistance: 4: Min assist Locomotion: Ambulation: 4: Travels 150 ft or more  with minimal assistance (Pt.>75%)  Comprehension Comprehension Mode: Auditory Comprehension: 5-Follows basic conversation/direction: With no assist  Expression Expression Mode: Verbal Expression: 5-Expresses basic needs/ideas: With extra time/assistive device  Social Interaction Social Interaction: 5-Interacts appropriately 90% of the time - Needs monitoring or encouragement for participation or interaction.  Problem Solving Problem Solving: 3-Solves basic 50 - 74% of the time/requires cueing 25 - 49% of the time  Memory Memory: 4-Recognizes or recalls 75 - 89% of the time/requires cueing 10 - 24% of the time  . large right middle sugar artery distribution nonhemorrhagic infarct  2. DVT Prophylaxis/Anticoagulation: Chronic Coumadin therapy with INR 1.77 12/27/2011. Continue Lovenox 40 mg daily  Until INR 2.00. Monitor platelet counts any signs of bleeding  3. Neuropsych: This patient is not capable of making decisions on his/her own behalf.  4. Recurrent seizures. EEG was negative. Continue Klonopin 0.5 mg each bedtime, vimpat 300mg  bid,keppra 2500mg  Q12hour, Dilantin 100 mg 3 times a day. Monitor closely for a seizure activity  5. Hypertension/atrial fibrillation of new onset. Lisinopril 5 mg daily. Cardiac rate is controlled. There's been no reports of chest pain or shortness of breath   7. Dysphagia. Dysphagia 1 pudding thick liquids. Continue intravenous fluids for hydration  8. Non-insulin-dependent diabetes mellitus: Hemoglobin A1c 6.9.. Patient currently with sliding scale insulin.    LOS (Days) 16 A FACE TO FACE EVALUATION WAS PERFORMED  KIRSTEINS,ANDREW E 01/12/2012, 8:36 AM

## 2012-01-12 NOTE — Progress Notes (Signed)
Occupational Therapy Note  Patient Details  Name: Brendan Scott MRN: [id number] Date of Birth: Apr 19, 1945 Today's Date: 01/12/2012  Time: 1130-1155 (cotx with Speech therapy-group time 4098-1191) Pt denies pain Group Therapy Pt participated in self feeding group with focus on attention to left, swallowing strategies, portion control, task initiation, and attention to task.  While patient waiting for food therapist reviewed swallowing strategies and engaged patient in increased use of LUE to assist with setup and self feeding.  After meal arrived patient began coughing after one bite of Dys1 meal.  Speech Therapist had patient stop eating and assisted with suctioning of patient's oral cavity removing clear secretions.   Lavone Neri [facility] 01/12/2012, 3:18 PM

## 2012-01-13 ENCOUNTER — Inpatient Hospital Stay (HOSPITAL_COMMUNITY): Payer: BC Managed Care – PPO | Admitting: *Deleted

## 2012-01-13 ENCOUNTER — Inpatient Hospital Stay (HOSPITAL_COMMUNITY): Payer: BC Managed Care – PPO

## 2012-01-13 LAB — GLUCOSE, CAPILLARY
Glucose-Capillary: 143 mg/dL — ABNORMAL HIGH (ref 70–99)
Glucose-Capillary: 111 mg/dL — ABNORMAL HIGH (ref 70–99)
Glucose-Capillary: 169 mg/dL — ABNORMAL HIGH (ref 70–99)

## 2012-01-13 NOTE — Progress Notes (Signed)
Occupational Therapy Session Note  Patient Details  Name: JAYSEAN MANVILLE MRN: [id number] Date of Birth: 1945-05-07  Today's Date: 01/13/2012 Time: 4098-1191 Time Calculation (min): 26 min   Skilled Therapeutic Interventions/Progress Updates: diners club Mr. Bolz very sleepy and lethargic and not eating at times and not able to demonstrate focus or selective attention to goals (i.e.visual scanning of tray/table) today.      Therapy Documentation Precautions:  Precautions Precautions: Fall Precaution Comments: Very limited L knee flexion ROM  Restrictions Weight Bearing Restrictions: No  Exercises:   Other Treatments:    See FIM for current functional status  Therapy/Group: Group Therapy  Rozelle Logan 01/13/2012, 5:13 PM

## 2012-01-13 NOTE — Progress Notes (Signed)
Speech Language Pathology Daily Session Note  Patient Details  Name: Brendan Scott MRN: [id number] Date of Birth: September 22, 1945  Today's Date: 01/13/2012 Time: 7829-5621 Time Calculation (min): 26 min  Short Term Goals: Week 3: SLP Short Term Goal 1 (Week 3): Patient will self-monitor and correct left anterior loss of textures and liquids during p.o. intake with mod assist verbal and question cues. SLP Short Term Goal 2 (Week 3): Patient will utilize swallowing compensatory strategies while consuming Dys.2 textures and thin liquids with mod assist verbal cues. SLP Short Term Goal 3 (Week 3): Patient will demonstrate selective attention to a functional task for 10 minutes with mod assist verbal and question cues for redirection. SLP Short Term Goal 4 (Week 3): Patient will attend to left enviornment/body with mod assist tactile and verbal cues. SLP Short Term Goal 5 (Week 3): Patient will maintain topic of conversation for ~3 turns with max assist verbal and question cues for redirection. SLP Short Term Goal 6 (Week 3): Patient will identify request help as neeed with mod assist verbal cues.   Skilled Therapeutic Interventions:   Co Tx with OT to address dysphagia and cog-ling goals via [employer].  Pt appeared sleepy toward end of session today,  mother-in-law who was present states pt became lethargic after receiving Tylenol.  Clayborne Dana, mother in law provided assistance with cueing pt to strategies.  Pt required moderate verbal, visual (use of mirror) cues to correct left anterior loss of solids (no liquid loss observed today) and max verbal cueing to swallow twice.  Weak cough noted x4 during meal today, could not differentiate from compensatory strategy adherence versus penetration/aspiration.  Pt required max verbal and visual cueing to sustain attention at approximately half way through session, attribute to lethargy resulting in poor intake.    FIM:  Comprehension Comprehension Mode:  Auditory Comprehension: 3-Understands basic 50 - 74% of the time/requires cueing 25 - 50%  of the time Expression Expression Mode: Verbal Expression: 5-Expresses basic 90% of the time/requires cueing < 10% of the time. Social Interaction Social Interaction: 4-Interacts appropriately 75 - 89% of the time - Needs redirection for appropriate language or to initiate interaction. Problem Solving Problem Solving: 2-Solves basic 25 - 49% of the time - needs direction more than half the time to initiate, plan or complete simple activities Memory Memory: 3-Recognizes or recalls 50 - 74% of the time/requires cueing 25 - 49% of the time FIM - Eating Eating Activity: 4: Helper checks for pocketed food  Pain  no complaint of pain  Therapy/Group: Group Therapy  Donavan Burnet, MS [facility] SLP [phone]

## 2012-01-13 NOTE — Progress Notes (Signed)
Patient ID: Brendan Scott, male   DOB: [birth year], 66 y.o.   MRN: [id number] Subjective/Complaints: Slept well. Occ cough  Review of Systems  Neurological: Positive for focal weakness.  All other systems reviewed and are negative.     Objective: Vital Signs: Blood pressure 134/74, pulse 73, temperature 98 F (36.7 C), temperature source Oral, resp. rate 18, height 5\' 6"  (1.676 m), weight 90.5 kg (199 lb 8.3 oz), SpO2 100.00%. No results found. Results for orders placed during the hospital encounter of 12/27/11 (from the past 72 hour(s))  GLUCOSE, CAPILLARY     Status: Abnormal   Collection Time   01/10/12 11:27 AM      Component Value Range Comment   Glucose-Capillary 183 (*) 70 - 99 mg/dL    Comment 1 Notify RN     GLUCOSE, CAPILLARY     Status: Normal   Collection Time   01/10/12  4:06 PM      Component Value Range Comment   Glucose-Capillary 97  70 - 99 mg/dL   GLUCOSE, CAPILLARY     Status: Abnormal   Collection Time   01/10/12  9:40 PM      Component Value Range Comment   Glucose-Capillary 148 (*) 70 - 99 mg/dL    Comment 1 Notify RN     GLUCOSE, CAPILLARY     Status: Abnormal   Collection Time   01/11/12  7:49 AM      Component Value Range Comment   Glucose-Capillary 135 (*) 70 - 99 mg/dL    Comment 1 Notify RN     GLUCOSE, CAPILLARY     Status: Abnormal   Collection Time   01/11/12 11:35 AM      Component Value Range Comment   Glucose-Capillary 154 (*) 70 - 99 mg/dL    Comment 1 Notify RN     GLUCOSE, CAPILLARY     Status: Abnormal   Collection Time   01/11/12  4:39 PM      Component Value Range Comment   Glucose-Capillary 146 (*) 70 - 99 mg/dL   GLUCOSE, CAPILLARY     Status: Abnormal   Collection Time   01/11/12  9:17 PM      Component Value Range Comment   Glucose-Capillary 120 (*) 70 - 99 mg/dL    Comment 1 Notify RN     PROTIME-INR     Status: Abnormal   Collection Time   01/12/12  6:15 AM      Component Value Range Comment   Prothrombin Time 23.9  (*) 11.6 - 15.2 seconds    INR 2.25 (*) 0.00 - 1.49   GLUCOSE, CAPILLARY     Status: Abnormal   Collection Time   01/12/12  7:27 AM      Component Value Range Comment   Glucose-Capillary 139 (*) 70 - 99 mg/dL   GLUCOSE, CAPILLARY     Status: Abnormal   Collection Time   01/12/12 11:13 AM      Component Value Range Comment   Glucose-Capillary 200 (*) 70 - 99 mg/dL   GLUCOSE, CAPILLARY     Status: Abnormal   Collection Time   01/12/12  4:31 PM      Component Value Range Comment   Glucose-Capillary 104 (*) 70 - 99 mg/dL   GLUCOSE, CAPILLARY     Status: Abnormal   Collection Time   01/12/12  9:12 PM      Component Value Range Comment   Glucose-Capillary 159 (*) 70 - 99  mg/dL   GLUCOSE, CAPILLARY     Status: Abnormal   Collection Time   01/13/12  7:33 AM      Component Value Range Comment   Glucose-Capillary 117 (*) 70 - 99 mg/dL     HEENT: L lower facial droop  Cardio: RRR  Resp: Clear to auscultation GI: BS positive  Extremity: Pulses positive and No Edema  Skin: Intact  Neuro: Awake oriented to place, month,situation, Cranial Nerve Abnormalities L central VII, Normal Sensory, Abnormal Motor 2-/5 L hand , 3-/5 Bi.tri,delt,HF,KE,ankle DF/PF, Dysarthric, Inattention and Other Left neglect no field cut  Musc/Skel: Normal and Other no shoulder pain  no ataxia  Still pocketing food   Assessment/Plan: 1. Functional deficits secondary to R MCA infarct which require 3+ hours per day of interdisciplinary therapy in a comprehensive inpatient rehab setting. Physiatrist is providing close team supervision and 24 hour management of active medical problems listed below. Physiatrist and rehab team continue to assess barriers to discharge/monitor patient progress toward functional and medical goals. FIM: FIM - Bathing Bathing Steps Patient Completed: Chest;Right Arm;Left Arm;Abdomen;Front perineal area;Buttocks;Right upper leg;Left upper leg;Right lower leg (including foot);Left lower leg  (including foot) Bathing: 4: Steadying assist  FIM - Upper Body Dressing/Undressing Upper body dressing/undressing steps patient completed: Thread/unthread right sleeve of pullover shirt/dresss;Put head through opening of pull over shirt/dress;Pull shirt over trunk Upper body dressing/undressing: 3: Mod-Patient completed 50-74% of tasks FIM - Lower Body Dressing/Undressing Lower body dressing/undressing steps patient completed: Thread/unthread right underwear leg;Thread/unthread left underwear leg;Thread/unthread right pants leg;Thread/unthread left pants leg;Don/Doff left shoe;Don/Doff right shoe Lower body dressing/undressing: 3: Mod-Patient completed 50-74% of tasks  FIM - Toileting Toileting steps completed by patient: Adjust clothing prior to toileting;Performs perineal hygiene Toileting Assistive Devices: Grab bar or rail for support Toileting: 3: Mod-Patient completed 2 of 3 steps  FIM - Diplomatic Services operational officer Devices: Grab bars;Walker Toilet Transfers: 4-To toilet/BSC: Min A (steadying Pt. > 75%);4-From toilet/BSC: Min A (steadying Pt. > 75%)  FIM - Bed/Chair Transfer Bed/Chair Transfer Assistive Devices: Arm rests Bed/Chair Transfer: 5: Supine > Sit: Supervision (verbal cues/safety issues);5: Sit > Supine: Supervision (verbal cues/safety issues);4: Bed > Chair or W/C: Min A (steadying Pt. > 75%);4: Chair or W/C > Bed: Min A (steadying Pt. > 75%)  FIM - Locomotion: Wheelchair Distance: 120 Locomotion: Wheelchair: 0: Activity did not occur FIM - Locomotion: Ambulation Locomotion: Ambulation Assistive Devices: Chief Operating Officer Ambulation/Gait Assistance: 4: Min assist Locomotion: Ambulation: 4: Travels 150 ft or more with minimal assistance (Pt.>75%)  Comprehension Comprehension Mode: Auditory Comprehension: 3-Understands basic 50 - 74% of the time/requires cueing 25 - 50%  of the time  Expression Expression Mode: Verbal Expression: 5-Expresses basic 90%  of the time/requires cueing < 10% of the time.  Social Interaction Social Interaction: 5-Interacts appropriately 90% of the time - Needs monitoring or encouragement for participation or interaction.  Problem Solving Problem Solving: 3-Solves basic 50 - 74% of the time/requires cueing 25 - 49% of the time  Memory Memory: 4-Recognizes or recalls 75 - 89% of the time/requires cueing 10 - 24% of the time  . 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 DVT Prophylaxis/Anticoagulation: Chronic Coumadin therapy with INR 1.77 12/27/2011. Continue Lovenox 40 mg daily  Until INR 2.00. Monitor platelet counts any signs of bleeding  3. Neuropsych: This patient is not capable of making decisions on his/her own behalf.  4. Recurrent seizures. EEG was negative. Continue Klonopin 0.5 mg each bedtime, vimpat 300mg   bid,keppra 2500mg  Q12hour, Dilantin 100 mg 3 times a day. Monitor closely for a seizure activity  5. Hypertension/atrial fibrillation of new onset. Lisinopril 5 mg daily. Cardiac rate is controlled. There's been no reports of chest pain or shortness of breath   7. Dysphagia. Monitor for signs of aspiration 8. Non-insulin-dependent diabetes mellitus: Hemoglobin A1c 6.9.. Patient currently with sliding scale insulin.    LOS (Days) 17 A FACE TO FACE EVALUATION WAS PERFORMED  Deetya Drouillard E 01/13/2012, 9:47 AM

## 2012-01-13 NOTE — Progress Notes (Signed)
Physical Therapy Note  Patient Details  Name: Brendan Scott MRN: [id number] Date of Birth: 12-May-1945 Today's Date: 01/13/2012  4098-1191 (45 minutes) group Pain: no reported pain Pt participated in PT group session focused on gait training/safety/endurance. Pt ambulates 80 feet X 3 min assist with HW with max vcs to scan to left initially.Pt required max vcs for right hand and HW placement during stand to sit .    [name],JIM 01/13/2012, 7:42 AM

## 2012-01-14 ENCOUNTER — Inpatient Hospital Stay (HOSPITAL_COMMUNITY): Payer: BC Managed Care – PPO

## 2012-01-14 LAB — GLUCOSE, CAPILLARY
Glucose-Capillary: 135 mg/dL — ABNORMAL HIGH (ref 70–99)
Glucose-Capillary: 152 mg/dL — ABNORMAL HIGH (ref 70–99)
Glucose-Capillary: 107 mg/dL — ABNORMAL HIGH (ref 70–99)

## 2012-01-14 MED ORDER — HYDROCERIN EX CREA
TOPICAL_CREAM | Freq: Two times a day (BID) | CUTANEOUS | Status: DC
  Administered 2012-01-14 – 2012-01-22 (×16): via TOPICAL
  Filled 2012-01-14: qty 113

## 2012-01-14 NOTE — Progress Notes (Signed)
Patient ID: Brendan Scott, male   DOB: Jul 30, 1945, 66 y.o.   MRN: [id number] Subjective/Complaints: Slept well. Occ cough Practicing movement of LUE Review of Systems  Neurological: Positive for focal weakness.  All other systems reviewed and are negative.     Objective: Vital Signs: Blood pressure 118/80, pulse 82, temperature 98 F (36.7 C), temperature source Oral, resp. rate 19, height 5\' 6"  (1.676 m), weight 90.5 kg (199 lb 8.3 oz), SpO2 100.00%. No results found. Results for orders placed during the hospital encounter of 12/27/11 (from the past 72 hour(s))  GLUCOSE, CAPILLARY     Status: Abnormal   Collection Time   01/11/12 11:35 AM      Component Value Range Comment   Glucose-Capillary 154 (*) 70 - 99 mg/dL    Comment 1 Notify RN     GLUCOSE, CAPILLARY     Status: Abnormal   Collection Time   01/11/12  4:39 PM      Component Value Range Comment   Glucose-Capillary 146 (*) 70 - 99 mg/dL   GLUCOSE, CAPILLARY     Status: Abnormal   Collection Time   01/11/12  9:17 PM      Component Value Range Comment   Glucose-Capillary 120 (*) 70 - 99 mg/dL    Comment 1 Notify RN     PROTIME-INR     Status: Abnormal   Collection Time   01/12/12  6:15 AM      Component Value Range Comment   Prothrombin Time 23.9 (*) 11.6 - 15.2 seconds    INR 2.25 (*) 0.00 - 1.49   GLUCOSE, CAPILLARY     Status: Abnormal   Collection Time   01/12/12  7:27 AM      Component Value Range Comment   Glucose-Capillary 139 (*) 70 - 99 mg/dL   GLUCOSE, CAPILLARY     Status: Abnormal   Collection Time   01/12/12 11:13 AM      Component Value Range Comment   Glucose-Capillary 200 (*) 70 - 99 mg/dL   GLUCOSE, CAPILLARY     Status: Abnormal   Collection Time   01/12/12  4:31 PM      Component Value Range Comment   Glucose-Capillary 104 (*) 70 - 99 mg/dL   GLUCOSE, CAPILLARY     Status: Abnormal   Collection Time   01/12/12  9:12 PM      Component Value Range Comment   Glucose-Capillary 159 (*) 70 -  99 mg/dL   GLUCOSE, CAPILLARY     Status: Abnormal   Collection Time   01/13/12  7:33 AM      Component Value Range Comment   Glucose-Capillary 117 (*) 70 - 99 mg/dL   GLUCOSE, CAPILLARY     Status: Abnormal   Collection Time   01/13/12 11:33 AM      Component Value Range Comment   Glucose-Capillary 143 (*) 70 - 99 mg/dL   GLUCOSE, CAPILLARY     Status: Abnormal   Collection Time   01/13/12  5:01 PM      Component Value Range Comment   Glucose-Capillary 111 (*) 70 - 99 mg/dL   GLUCOSE, CAPILLARY     Status: Abnormal   Collection Time   01/13/12  8:33 PM      Component Value Range Comment   Glucose-Capillary 169 (*) 70 - 99 mg/dL    Comment 1 Notify RN     GLUCOSE, CAPILLARY     Status: Abnormal   Collection Time  01/14/12  7:40 AM      Component Value Range Comment   Glucose-Capillary 135 (*) 70 - 99 mg/dL     HEENT: L lower facial droop  Cardio: RRR  Resp: Clear to auscultation GI: BS positive  Extremity: Pulses positive and No Edema  Skin: Intact  Neuro: Awake oriented to place, month,situation, Cranial Nerve Abnormalities L central VII, Normal Sensory, Abnormal Motor 2-/5 L hand , 3-/5 Bi.tri,delt,HF,KE,ankle DF/PF, Dysarthric, Inattention and Other Left neglect no field cut  Musc/Skel: Normal and Other no shoulder pain  no ataxia  Still pocketing food   Assessment/Plan: 1. Functional deficits secondary to R MCA infarct which require 3+ hours per day of interdisciplinary therapy in a comprehensive inpatient rehab setting. Physiatrist is providing close team supervision and 24 hour management of active medical problems listed below. Physiatrist and rehab team continue to assess barriers to discharge/monitor patient progress toward functional and medical goals. FIM: FIM - Bathing Bathing Steps Patient Completed: Chest;Right Arm;Left Arm;Abdomen;Front perineal area;Buttocks;Right upper leg;Left upper leg;Right lower leg (including foot);Left lower leg (including  foot) Bathing: 4: Steadying assist  FIM - Upper Body Dressing/Undressing Upper body dressing/undressing steps patient completed: Thread/unthread right sleeve of pullover shirt/dresss;Put head through opening of pull over shirt/dress;Pull shirt over trunk Upper body dressing/undressing: 3: Mod-Patient completed 50-74% of tasks FIM - Lower Body Dressing/Undressing Lower body dressing/undressing steps patient completed: Thread/unthread right underwear leg;Thread/unthread left underwear leg;Thread/unthread right pants leg;Thread/unthread left pants leg;Don/Doff left shoe;Don/Doff right shoe Lower body dressing/undressing: 3: Mod-Patient completed 50-74% of tasks  FIM - Toileting Toileting steps completed by patient: Adjust clothing prior to toileting;Performs perineal hygiene Toileting Assistive Devices: Grab bar or rail for support Toileting: 3: Mod-Patient completed 2 of 3 steps  FIM - Diplomatic Services operational officer Devices: Grab bars;Walker Toilet Transfers: 4-To toilet/BSC: Min A (steadying Pt. > 75%);4-From toilet/BSC: Min A (steadying Pt. > 75%)  FIM - Bed/Chair Transfer Bed/Chair Transfer Assistive Devices: Arm rests Bed/Chair Transfer: 5: Supine > Sit: Supervision (verbal cues/safety issues);5: Sit > Supine: Supervision (verbal cues/safety issues);4: Bed > Chair or W/C: Min A (steadying Pt. > 75%);4: Chair or W/C > Bed: Min A (steadying Pt. > 75%)  FIM - Locomotion: Wheelchair Distance: 120 Locomotion: Wheelchair: 0: Activity did not occur FIM - Locomotion: Ambulation Locomotion: Ambulation Assistive Devices: Chief Operating Officer Ambulation/Gait Assistance: 4: Min assist Locomotion: Ambulation: 4: Travels 150 ft or more with minimal assistance (Pt.>75%)  Comprehension Comprehension Mode: Auditory Comprehension: 3-Understands basic 50 - 74% of the time/requires cueing 25 - 50%  of the time  Expression Expression Mode: Verbal Expression: 5-Expresses basic 90% of the  time/requires cueing < 10% of the time.  Social Interaction Social Interaction: 4-Interacts appropriately 75 - 89% of the time - Needs redirection for appropriate language or to initiate interaction.  Problem Solving Problem Solving: 2-Solves basic 25 - 49% of the time - needs direction more than half the time to initiate, plan or complete simple activities  Memory Memory: 3-Recognizes or recalls 50 - 74% of the time/requires cueing 25 - 49% of the time  . 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 DVT Prophylaxis/Anticoagulation: Chronic Coumadin therapy with INR 1.77 12/27/2011. Continue Lovenox 40 mg daily  Until INR 2.00. Monitor platelet counts any signs of bleeding  3. Neuropsych: This patient is not capable of making decisions on his/her own behalf.  4. Recurrent seizures. EEG was negative. Continue Klonopin 0.5 mg each bedtime, vimpat 300mg  bid,keppra 2500mg  Q12hour, Dilantin 100 mg  3 times a day. Monitor closely for a seizure activity. Wife would like a neurologist closer to home.  Will try calling Dr Ninetta Lights 5. Hypertension/atrial fibrillation of new onset. Lisinopril 5 mg daily. Cardiac rate is controlled. There's been no reports of chest pain or shortness of breath   7. Dysphagia. Monitor for signs of aspiration 8. Non-insulin-dependent diabetes mellitus: Hemoglobin A1c 6.9.. Patient currently with sliding scale insulin.    LOS (Days) 18 A FACE TO FACE EVALUATION WAS PERFORMED  KIRSTEINS,ANDREW E 01/14/2012, 9:43 AM

## 2012-01-14 NOTE — Progress Notes (Signed)
Physical Therapy Session Note  Patient Details  Name: LOUDEN HOUSEWORTH MRN: [id number] Date of Birth: Mar 14, 1945  Today's Date: 01/14/2012 Time: 1108-1203 Time Calculation (min): 55 min  Pt denies pain but very fatigued this AM. Pt's wife reports it is from the medication he received and has been drowsy all morning. Pt participate in group therapy with focus on gait with hemiwalker and sit to stands with emphasis on technique and hand placement (up to mod A needed to stabilize). Pt able to gait x 95' x 2 with seated rest breaks and > 150' back to pt room with min A and cues for posture and to keep hemiwalker positioned closer to body. Pt able to direct way back to his room from the gym without cueing and navigate through obstacles with S.  Therapy Documentation Precautions:  Precautions Precautions: Fall Precaution Comments: Very limited L knee flexion ROM  Restrictions Weight Bearing Restrictions: No   Locomotion : Ambulation Ambulation/Gait Assistance: 4: Min assist   See FIM for current functional status  Therapy/Group: Group Therapy  Tedd Sias 01/14/2012, 12:29 PM

## 2012-01-15 ENCOUNTER — Inpatient Hospital Stay (HOSPITAL_COMMUNITY): Payer: BC Managed Care – PPO | Admitting: Occupational Therapy

## 2012-01-15 ENCOUNTER — Inpatient Hospital Stay (HOSPITAL_COMMUNITY): Payer: BC Managed Care – PPO | Admitting: Speech Pathology

## 2012-01-15 ENCOUNTER — Inpatient Hospital Stay (HOSPITAL_COMMUNITY): Payer: BC Managed Care – PPO | Admitting: Physical Therapy

## 2012-01-15 DIAGNOSIS — I6340 Cerebral infarction due to embolism of unspecified cerebral artery: Secondary | ICD-10-CM

## 2012-01-15 DIAGNOSIS — G8110 Spastic hemiplegia affecting unspecified side: Secondary | ICD-10-CM

## 2012-01-15 LAB — GLUCOSE, CAPILLARY
Glucose-Capillary: 190 mg/dL — ABNORMAL HIGH (ref 70–99)
Glucose-Capillary: 140 mg/dL — ABNORMAL HIGH (ref 70–99)
Glucose-Capillary: 110 mg/dL — ABNORMAL HIGH (ref 70–99)

## 2012-01-15 NOTE — Progress Notes (Signed)
Occupational Therapy Session Note  Patient Details  Name: Brendan Scott MRN: [id number] Date of Birth: September 01, 1945  Today's Date: 01/15/2012 Time: 0930-1030 Time Calculation (min): 60 min  Short Term Goals: Week 3:  OT Short Term Goal 1 (Week 3): Pt will complete 2 of 4 grooming tasks in standing with min assist. OT Short Term Goal 2 (Week 3): Pt will complete toileting with min assist (3/3 steps with steadying assistance) OT Short Term Goal 3 (Week 3): Pt will demonstrate increased grasp with Lt hand by pulling up pants in standing. OT Short Term Goal 4 (Week 3): Pt will complete UB dressing with min assist 3/4 times. OT Short Term Goal 5 (Week 3): Pt will complete LB dressing with min assist  Skilled Therapeutic Interventions/Progress Updates:    Pt seen for ADL retraining with focus on increased sequencing, initiation, decreased perseveration, and dressing techniques to increase functionality and independence.  Pt received sitting up in chair, required increased time and max verbal cues to stand up from chair.  Ambulated with HHA to sink to complete bathing, pt initiated oral hygiene in standing with min/steadying assist.  Pt with decreased Lt grasp strength with multiple drops of washcloth when attempting to bathe with LUE.  Backwards chaining with socks with pt able to pull socks up after donned by this therapist.  Pt continues to have difficulties perceptually with donning shirt with mod cues and donning Lt sleeve.  Issued pt shoe buttons to assist with fastening shoes with pt return demonstrating fastening Rt shoe.  Therapy Documentation Precautions:  Precautions Precautions: Fall Precaution Comments: Very limited L knee flexion ROM  Restrictions Weight Bearing Restrictions: No Pain: Pain Assessment Pain Assessment: No/denies pain  See FIM for current functional status  Therapy/Group: Individual Therapy  Leonette Monarch 01/15/2012, 11:47 AM

## 2012-01-15 NOTE — Progress Notes (Signed)
Orthopedic Tech Progress Note Patient Details:  Brendan Scott 1945/05/19 [id number] Order completed by Thayer Ohm from [facility]  Patient ID: Brendan Scott, male   DOB: December 31, 1945, 66 y.o.   MRN: [id number]   [name] 01/15/2012, 3:04 PM

## 2012-01-15 NOTE — Progress Notes (Signed)
Speech Language Pathology Daily Session Note  Patient Details  Name: Brendan Scott MRN: [id number] Date of Birth: [birth year]  Today's Date: 01/15/2012 Time: 1130-1200 Time Calculation (min): 30 min  Short Term Goals: Week 3: SLP Short Term Goal 1 (Week 3): Patient will self-monitor and correct left anterior loss of textures and liquids during p.o. intake with mod assist verbal and question cues. SLP Short Term Goal 2 (Week 3): Patient will utilize swallowing compensatory strategies while consuming Dys.2 textures and thin liquids with mod assist verbal cues. SLP Short Term Goal 3 (Week 3): Patient will demonstrate selective attention to a functional task for 10 minutes with mod assist verbal and question cues for redirection. SLP Short Term Goal 4 (Week 3): Patient will attend to left enviornment/body with mod assist tactile and verbal cues. SLP Short Term Goal 5 (Week 3): Patient will maintain topic of conversation for ~3 turns with max assist verbal and question cues for redirection. SLP Short Term Goal 6 (Week 3): Patient will identify request help as neeed with mod assist verbal cues.   Skilled Therapeutic Interventions: Group session, co-treatment with OT; SLP focused on addressing dysphagia goals. Patient consumed Dys.1 textures and thin liquids with min assist increased to max assist to self-monitor left pocketing and anterior loss of food during meal and to stop eating when coughing episodes occur. Patient exhibited increased fatigue as session went on and as a result exhibited increased throat clears and coughs throughout session. Patient exhibited some increased attention when guests arrived and was able to finish meal.  Continue with current plan of care.   FIM:  Comprehension Comprehension Mode: Auditory Comprehension: 2-Understands basic 25 - 49% of the time/requires cueing 51 - 75% of the time Expression Expression Mode: Verbal Expression: 3-Expresses basic 50 - 74% of the  time/requires cueing 25 - 50% of the time. Needs to repeat parts of sentences. Social Interaction Social Interaction: 4-Interacts appropriately 75 - 89% of the time - Needs redirection for appropriate language or to initiate interaction. Problem Solving Problem Solving: 2-Solves basic 25 - 49% of the time - needs direction more than half the time to initiate, plan or complete simple activities Memory Memory: 3-Recognizes or recalls 50 - 74% of the time/requires cueing 25 - 49% of the time FIM - Eating Eating Activity: 4: Helper checks for pocketed food  Pain Pain Assessment Pain Assessment: No/denies pain  Therapy/Group: Group Therapy  Charlane Ferretti., CCC-SLP [phone]  Gill Delrossi 01/15/2012, 1:07 PM

## 2012-01-15 NOTE — Progress Notes (Signed)
Physical Therapy Session Note  Patient Details  Name: Brendan Scott MRN: [id number] Date of Birth: 09-Dec-1945  Today's Date: 01/15/2012 Time: 4098-1191 Time Calculation (min): 67 min  Short Term Goals: Week 3:  PT Short Term Goal 1 (Week 3): = LTG  Skilled Therapeutic Interventions/Progress Updates:   Patient perseverating on leaving and going home; multiple times during session patient asking "when can I leave?", "who can sign me out and take me home?".  Reinforced to patient areas that still need to be addressed in order to go home at safer level and verbal cues needed to redirect patient to task.  Patient performed gait in controlled environment over tile and carpet with hemi walker x 200' x 2 reps with supervision-min A with verbal cues for guidance and for obstacle negotiation in hallway, safety with hemi walker when entering/exiting doors.  Patient demonstrating improved gait speed and continuous stepping with hemi walker.  Patient stood and washed hands at sink with supervision-min A with min verbal cues for sequencing.  Continued to stand at tall table x 10-15 minutes without UE support while performing functional cooking task with bilat UE use for sequencing and coordination and visual scanning to L to place sausage balls in row on baking sheet with min A overall for balance.  Patient returned to room to be fit for resting hand orthosis LUE to maintain extension ROM.  Patient to wear at night only.  Patient performed transfer to toilet with min HHA and toileting tasks in standing with min A overall.  Returned to bed sit >supine with supervision.     Therapy Documentation Precautions:  Precautions Precautions: Fall Precaution Comments: Very limited L knee flexion ROM  Restrictions Weight Bearing Restrictions: No Pain: Pain Assessment Pain Assessment: No/denies pain Locomotion : Ambulation Ambulation/Gait Assistance: 4: Min guard   See FIM for current functional  status  Therapy/Group: Individual Therapy  Edman Circle [facility] 01/15/2012, 2:49 PM

## 2012-01-15 NOTE — Progress Notes (Signed)
Occupational Therapy Note  Patient Details  Name: Brendan Scott MRN: [id number] Date of Birth: 02/25/1945 Today's Date: 01/15/2012  Time: 1200-1215 Pt denies pain Group Therapy Pt participated in self feeding group with focus on BUE use for meal set up and self feeding, task initiation, attention to task, swallowing strategies, and portion control.  Pt became lethargic during meal and required verbal cues to remain engaged in session.  Pt required verbal cues to perform tongue sweep to check for pocketed food.     Lavone Neri [facility] 01/15/2012, 3:33 PM

## 2012-01-15 NOTE — Progress Notes (Signed)
Orthopedic Tech Progress Note Patient Details:  Brendan Scott 1945-12-06 [id number] [facility] called to place brace order Patient ID: [name], male   DOB: 1945-11-06, 66 y.o.   MRN: [id number]   [name] 01/15/2012, 10:52 AM

## 2012-01-15 NOTE — Progress Notes (Signed)
NUTRITION FOLLOW UP  Intervention:   Continue to encourage intake Ensure Pudding once daily Glucerna Shake bid  Nutrition Dx:   Inadequate oral intake r/t dysphagia AEB dysphagia 1 thin liquid diet  Goal:   Intake of >90% meals  Monitor:   Intake, labs, weight  Assessment:   Intake 10-90% of meals.  Pt reports improving appetite.  Intake is improving.    Height: Ht Readings from Last 1 Encounters:  01/09/12 5\' 6"  (1.676 m)    Weight Status:   Wt Readings from Last 1 Encounters:  01/10/12 199 lb 8.3 oz (90.5 kg)    Re-estimated needs:  Kcal: 1800-1900 Protein: 90-100 Fluid: 1.8-1.9L daily   Skin: intact  Diet Order: Dysphagia 1 thin, CHO MOD   Intake/Output Summary (Last 24 hours) at 01/15/12 1454 Last data filed at 01/14/12 1755  Gross per 24 hour  Intake    240 ml  Output      0 ml  Net    240 ml     Labs:  No results found for this basename: NA:3,K:3,CL:3,CO2:3,BUN:3,CREATININE:3,CALCIUM:3,MG:3,PHOS:3,GLUCOSE:3 in the last 168 hours CBG (last 3)   Basename 01/15/12 1112 01/15/12 0723 01/14/12 1651  GLUCAP 190* 115* 107*    Scheduled Meds:   . clonazePAM  0.5 mg Oral QHS  . feeding supplement  1 Container Oral Q24H  . feeding supplement  237 mL Oral BID BM  . hydrocerin   Topical BID  . insulin aspart  0-9 Units Subcutaneous TID WC  . lacosamide  300 mg Oral BID  . levETIRAcetam  2,500 mg Oral Q12H  . lisinopril  5 mg Oral Daily  . metFORMIN  500 mg Oral Q breakfast  . phenytoin  100 mg Oral TID  . senna-docusate  1 tablet Oral QHS  . simvastatin  20 mg Oral q1800  . warfarin  2.5 mg Oral Custom  . warfarin  5 mg Oral Custom  . Warfarin - Pharmacist Dosing Inpatient   Does not apply q1800   Continuous Infusions:      Oran Rein, RD, LDN Clinical Inpatient Dietitian Pager:  [phone] Weekend and after hours pager:  [phone]

## 2012-01-15 NOTE — Progress Notes (Signed)
Recreational Therapy Session Note  Patient Details  Name: Brendan Scott MRN: [id number] Date of Birth: [birth year] Today's Date: 01/15/2012 Time:  1335-1420 Pain: no c/o Skilled Therapeutic Interventions/Progress Updates: Patient perseverating on leaving and going home multiple times during session patient asking "when can I leave?", "who can sign me out and take me home?". Reinforced to patient areas that still need to be addressed in order to go home at safer level and verbal cues needed to redirect patient to task. Patient ambulated from room to dayroom with hemi walker x 200' with supervision-min A & verbal cues for safety/obstacle negotiation.  Pt stood & washed hands at sink with supervision-min A with min verbal cues for sequencing. Pt stood at hi-low table 10-15 minutes without UE support/using BUE to make sausage balls with focus on sequencing, coordination, & scanning to left.  Therapy/Group: Co-Treatment Gwenna Fuston 01/15/2012, 4:08 PM

## 2012-01-15 NOTE — Progress Notes (Signed)
Speech Language Pathology Daily Session Note  Patient Details  Name: Brendan Scott MRN: [id number] Date of Birth: [birth year]  Today's Date: 01/15/2012 Time: 4098-1191 Time Calculation (min): 40 min  Short Term Goals: Week 3: SLP Short Term Goal 1 (Week 3): Patient will self-monitor and correct left anterior loss of textures and liquids during p.o. intake with mod assist verbal and question cues. SLP Short Term Goal 2 (Week 3): Patient will utilize swallowing compensatory strategies while consuming Dys.2 textures and thin liquids with mod assist verbal cues. SLP Short Term Goal 3 (Week 3): Patient will demonstrate selective attention to a functional task for 10 minutes with mod assist verbal and question cues for redirection. SLP Short Term Goal 4 (Week 3): Patient will attend to left enviornment/body with mod assist tactile and verbal cues. SLP Short Term Goal 5 (Week 3): Patient will maintain topic of conversation for ~3 turns with max assist verbal and question cues for redirection. SLP Short Term Goal 6 (Week 3): Patient will identify request help as neeed with mod assist verbal cues.   Skilled Therapeutic Interventions: Skilled therapy focused on addressing dysphagia and cognition during various self care tasks. SLP found patient in bed having already consumed breakfast with significant left sided buccal residue.  Patient required max assist multi-modal cues to reduce residue with tongue sweeps and liquids washes.  SLP then facilitated session with trials of Dys.3 textures with max assist cues to attend to mastication and compensate for left sided buccal pocketing.  After trials with residue still present SLP provided max assist to sequence and problem solve transfer, gait with walker and standing at the sink to perform oral care.  Throughout session total assist was needed to break perseveration when it occurred.    FIM:  Comprehension Comprehension Mode: Auditory Comprehension:  2-Understands basic 25 - 49% of the time/requires cueing 51 - 75% of the time Expression Expression Mode: Verbal Expression: 3-Expresses basic 50 - 74% of the time/requires cueing 25 - 50% of the time. Needs to repeat parts of sentences. Social Interaction Social Interaction: 4-Interacts appropriately 75 - 89% of the time - Needs redirection for appropriate language or to initiate interaction. Problem Solving Problem Solving: 2-Solves basic 25 - 49% of the time - needs direction more than half the time to initiate, plan or complete simple activities Memory Memory: 3-Recognizes or recalls 50 - 74% of the time/requires cueing 25 - 49% of the time FIM - Eating Eating Activity: 4: Helper checks for pocketed food  Pain Pain Assessment Pain Assessment: No/denies pain  Therapy/Group: Individual Therapy  Charlane Ferretti., CCC-SLP [phone]  Marcea Rojek 01/15/2012, 11:09 AM

## 2012-01-15 NOTE — Progress Notes (Signed)
ANTICOAGULATION CONSULT NOTE - Follow Up Consult Pharmacy Consult:  Coumadin Indication: atrial fibrillation  No Known Allergies  Patient Measurements: Ht: 66", Wt: 95.5 Kg  Vital Signs: Temp: 98.1 F (36.7 C) (12/16 0558) Temp src: Oral (12/16 0558) BP: 117/81 mmHg (12/16 0558) Pulse Rate: 89  (12/16 0558)  Labs:  Basename 01/15/12 0540  HGB --  HCT --  PLT --  APTT --  LABPROT 26.3*  INR 2.56*  HEPARINUNFRC --  CREATININE --  CKTOTAL --  CKMB --  TROPONINI --   Assessment: 66 y/o male patient with a history of Afib who was on ASA 81 mg PTA and admitted with a stroke, not a candidate for TPA 2nd mass lesion on head CT.   Patient continues on Coumadin therapy with therapeutic INR.  No bleeding documented. CBC stable on 12/11. Noted Dilantin therapy could increase the effect of Coumadin.  He continues on Keppra as well.  He is off Lovenox with therapeutic INR.  Goal of Therapy:  INR 2-3   Plan:  - Coumadin 2.5mg  PO daily except 5mg  on Tues / Fri - PT/INR M/W/F  Herby Abraham, Pharm.D. [phone] 01/15/2012 11:00 AM

## 2012-01-15 NOTE — Progress Notes (Signed)
Patient ID: Brendan Scott, male   DOB: 09-26-1945, 66 y.o.   MRN: [id number] Subjective/Complaints: No Seizure activity Discussed need for continuing current dose of seizure meds (again) Left hand tight with finger flexion Review of Systems  Neurological: Positive for focal weakness.  All other systems reviewed and are negative.     Objective: Vital Signs: Blood pressure 117/81, pulse 89, temperature 98.1 F (36.7 C), temperature source Oral, resp. rate 18, height 5\' 6"  (1.676 m), weight 90.5 kg (199 lb 8.3 oz), SpO2 99.00%. No results found. Results for orders placed during the hospital encounter of 12/27/11 (from the past 72 hour(s))  GLUCOSE, CAPILLARY     Status: Abnormal   Collection Time   01/12/12 11:13 AM      Component Value Range Comment   Glucose-Capillary 200 (*) 70 - 99 mg/dL   GLUCOSE, CAPILLARY     Status: Abnormal   Collection Time   01/12/12  4:31 PM      Component Value Range Comment   Glucose-Capillary 104 (*) 70 - 99 mg/dL   GLUCOSE, CAPILLARY     Status: Abnormal   Collection Time   01/12/12  9:12 PM      Component Value Range Comment   Glucose-Capillary 159 (*) 70 - 99 mg/dL   GLUCOSE, CAPILLARY     Status: Abnormal   Collection Time   01/13/12  7:33 AM      Component Value Range Comment   Glucose-Capillary 117 (*) 70 - 99 mg/dL   GLUCOSE, CAPILLARY     Status: Abnormal   Collection Time   01/13/12 11:33 AM      Component Value Range Comment   Glucose-Capillary 143 (*) 70 - 99 mg/dL   GLUCOSE, CAPILLARY     Status: Abnormal   Collection Time   01/13/12  5:01 PM      Component Value Range Comment   Glucose-Capillary 111 (*) 70 - 99 mg/dL   GLUCOSE, CAPILLARY     Status: Abnormal   Collection Time   01/13/12  8:33 PM      Component Value Range Comment   Glucose-Capillary 169 (*) 70 - 99 mg/dL    Comment 1 Notify RN     GLUCOSE, CAPILLARY     Status: Abnormal   Collection Time   01/14/12  7:40 AM      Component Value Range Comment    Glucose-Capillary 135 (*) 70 - 99 mg/dL   GLUCOSE, CAPILLARY     Status: Abnormal   Collection Time   01/14/12 12:06 PM      Component Value Range Comment   Glucose-Capillary 152 (*) 70 - 99 mg/dL   GLUCOSE, CAPILLARY     Status: Abnormal   Collection Time   01/14/12  4:51 PM      Component Value Range Comment   Glucose-Capillary 107 (*) 70 - 99 mg/dL   PROTIME-INR     Status: Abnormal   Collection Time   01/15/12  5:40 AM      Component Value Range Comment   Prothrombin Time 26.3 (*) 11.6 - 15.2 seconds    INR 2.56 (*) 0.00 - 1.49   GLUCOSE, CAPILLARY     Status: Abnormal   Collection Time   01/15/12  7:23 AM      Component Value Range Comment   Glucose-Capillary 115 (*) 70 - 99 mg/dL    Comment 1 Notify RN       HEENT: L lower facial droop  Cardio: RRR  Resp: Clear to auscultation GI: BS positive  Extremity: Pulses positive and No Edema  Skin: Intact  Neuro: Awake oriented to place, month,situation, Cranial Nerve Abnormalities L central VII, Normal Sensory, Abnormal Motor 2-/5 L hand , 3-/5 Bi.tri,delt,HF,KE,ankle DF/PF, Dysarthric, Inattention and Other Left neglect no field cut  Musc/Skel: Normal and Other no shoulder pain  no ataxia  Still pocketing food   Assessment/Plan: 1. Functional deficits secondary to R MCA infarct which require 3+ hours per day of interdisciplinary therapy in a comprehensive inpatient rehab setting. Physiatrist is providing close team supervision and 24 hour management of active medical problems listed below. Physiatrist and rehab team continue to assess barriers to discharge/monitor patient progress toward functional and medical goals. FIM: FIM - Bathing Bathing Steps Patient Completed: Chest;Right Arm;Left Arm;Abdomen;Front perineal area;Buttocks;Right upper leg;Left upper leg;Right lower leg (including foot);Left lower leg (including foot) Bathing: 4: Steadying assist  FIM - Upper Body Dressing/Undressing Upper body dressing/undressing  steps patient completed: Thread/unthread right sleeve of pullover shirt/dresss;Put head through opening of pull over shirt/dress;Pull shirt over trunk Upper body dressing/undressing: 3: Mod-Patient completed 50-74% of tasks FIM - Lower Body Dressing/Undressing Lower body dressing/undressing steps patient completed: Thread/unthread right underwear leg;Thread/unthread left underwear leg;Thread/unthread right pants leg;Thread/unthread left pants leg;Don/Doff left shoe;Don/Doff right shoe Lower body dressing/undressing: 3: Mod-Patient completed 50-74% of tasks  FIM - Toileting Toileting steps completed by patient: Adjust clothing prior to toileting;Performs perineal hygiene Toileting Assistive Devices: Grab bar or rail for support Toileting: 3: Mod-Patient completed 2 of 3 steps  FIM - Diplomatic Services operational officer Devices: Grab bars;Walker Toilet Transfers: 4-To toilet/BSC: Min A (steadying Pt. > 75%);4-From toilet/BSC: Min A (steadying Pt. > 75%)  FIM - Bed/Chair Transfer Bed/Chair Transfer Assistive Devices: Arm rests Bed/Chair Transfer: 3: Bed > Chair or W/C: Mod A (lift or lower assist);3: Chair or W/C > Bed: Mod A (lift or lower assist)  FIM - Locomotion: Wheelchair Distance: 120 Locomotion: Wheelchair: 0: Activity did not occur FIM - Locomotion: Ambulation Locomotion: Ambulation Assistive Devices: Chief Operating Officer Ambulation/Gait Assistance: 4: Min assist Locomotion: Ambulation: 4: Travels 150 ft or more with minimal assistance (Pt.>75%)  Comprehension Comprehension Mode: Auditory Comprehension: 3-Understands basic 50 - 74% of the time/requires cueing 25 - 50%  of the time  Expression Expression Mode: Verbal Expression: 5-Expresses basic 90% of the time/requires cueing < 10% of the time.  Social Interaction Social Interaction: 4-Interacts appropriately 75 - 89% of the time - Needs redirection for appropriate language or to initiate interaction.  Problem  Solving Problem Solving: 3-Solves basic 50 - 74% of the time/requires cueing 25 - 49% of the time  Memory Memory: 4-Recognizes or recalls 75 - 89% of the time/requires cueing 10 - 24% of the time  . 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 DVT Prophylaxis/Anticoagulation: Chronic Coumadin therapy with INR 1.77 12/27/2011. Continue Lovenox 40 mg daily  Until INR 2.00. Monitor platelet counts any signs of bleeding  3. Neuropsych: This patient is not capable of making decisions on his/her own behalf.  4. Epilepsia partialis continua now controlled. Continue Klonopin 0.5 mg each bedtime, vimpat 300mg  bid,keppra 2500mg  Q12hour, Dilantin 100 mg 3 times a day. Monitor closely for a seizure activity. Wife would like a neurologist closer to home.  Wife will try calling Dr Ninetta Lights 5. Hypertension/atrial fibrillation of new onset. Lisinopril 5 mg daily. Cardiac rate is controlled. There's been no reports of chest pain or shortness of breath   7. Dysphagia. Monitor for signs  of aspiration 8. Non-insulin-dependent diabetes mellitus: Hemoglobin A1c 6.9.. Patient currently with sliding scale insulin.    LOS (Days) 19 A FACE TO FACE EVALUATION WAS PERFORMED  KIRSTEINS,ANDREW E 01/15/2012, 7:37 AM

## 2012-01-16 ENCOUNTER — Inpatient Hospital Stay (HOSPITAL_COMMUNITY): Payer: BC Managed Care – PPO | Admitting: Occupational Therapy

## 2012-01-16 ENCOUNTER — Inpatient Hospital Stay (HOSPITAL_COMMUNITY): Payer: BC Managed Care – PPO | Admitting: Speech Pathology

## 2012-01-16 ENCOUNTER — Inpatient Hospital Stay (HOSPITAL_COMMUNITY): Payer: BC Managed Care – PPO | Admitting: Physical Therapy

## 2012-01-16 ENCOUNTER — Inpatient Hospital Stay (HOSPITAL_COMMUNITY): Payer: BC Managed Care – PPO | Admitting: *Deleted

## 2012-01-16 LAB — GLUCOSE, CAPILLARY: Glucose-Capillary: 131 mg/dL — ABNORMAL HIGH (ref 70–99)

## 2012-01-16 NOTE — Progress Notes (Signed)
Recreational Therapy Session Note  Patient Details  Name: [name] MRN: [id number] Date of Birth: Feb 15, 1945 Today's Date: 01/16/2012 Time:  1105-1130 Pain: no c/o  Skilled Therapeutic Interventions/Progress Updates: Pt sat w/c level for card activity with focus on attention to the left & LUE, sorting cards by color, and LUE use.  Pt required max cues for first 4-5 cards and progress to min cues.  Pt's wife present and observing.  Therapy/Group: Individual Therapy   Katiria Calame 01/16/2012, 6:47 PM

## 2012-01-16 NOTE — Progress Notes (Addendum)
Patient ID: Brendan Scott, male   DOB: Sep 20, 1945, 66 y.o.   MRN: [id number] Subjective/Complaints: No Seizure activity  Left hand less  tight with finger flexion, slept with wrist hand orthosis Review of Systems  Neurological: Positive for focal weakness.  All other systems reviewed and are negative.     Objective: Vital Signs: Blood pressure 118/70, pulse 88, temperature 97.7 F (36.5 C), temperature source Oral, resp. rate 18, height 5\' 6"  (1.676 m), weight 90.5 kg (199 lb 8.3 oz), SpO2 99.00%. No results found. Results for orders placed during the hospital encounter of 12/27/11 (from the past 72 hour(s))  GLUCOSE, CAPILLARY     Status: Abnormal   Collection Time   01/13/12 11:33 AM      Component Value Range Comment   Glucose-Capillary 143 (*) 70 - 99 mg/dL   GLUCOSE, CAPILLARY     Status: Abnormal   Collection Time   01/13/12  5:01 PM      Component Value Range Comment   Glucose-Capillary 111 (*) 70 - 99 mg/dL   GLUCOSE, CAPILLARY     Status: Abnormal   Collection Time   01/13/12  8:33 PM      Component Value Range Comment   Glucose-Capillary 169 (*) 70 - 99 mg/dL    Comment 1 Notify RN     GLUCOSE, CAPILLARY     Status: Abnormal   Collection Time   01/14/12  7:40 AM      Component Value Range Comment   Glucose-Capillary 135 (*) 70 - 99 mg/dL   GLUCOSE, CAPILLARY     Status: Abnormal   Collection Time   01/14/12 12:06 PM      Component Value Range Comment   Glucose-Capillary 152 (*) 70 - 99 mg/dL   GLUCOSE, CAPILLARY     Status: Abnormal   Collection Time   01/14/12  4:51 PM      Component Value Range Comment   Glucose-Capillary 107 (*) 70 - 99 mg/dL   PROTIME-INR     Status: Abnormal   Collection Time   01/15/12  5:40 AM      Component Value Range Comment   Prothrombin Time 26.3 (*) 11.6 - 15.2 seconds    INR 2.56 (*) 0.00 - 1.49   GLUCOSE, CAPILLARY     Status: Abnormal   Collection Time   01/15/12  7:23 AM      Component Value Range Comment   Glucose-Capillary 115 (*) 70 - 99 mg/dL    Comment 1 Notify RN     GLUCOSE, CAPILLARY     Status: Abnormal   Collection Time   01/15/12 11:12 AM      Component Value Range Comment   Glucose-Capillary 190 (*) 70 - 99 mg/dL    Comment 1 Notify RN     GLUCOSE, CAPILLARY     Status: Abnormal   Collection Time   01/15/12  4:39 PM      Component Value Range Comment   Glucose-Capillary 140 (*) 70 - 99 mg/dL    Comment 1 Notify RN     GLUCOSE, CAPILLARY     Status: Abnormal   Collection Time   01/15/12  9:16 PM      Component Value Range Comment   Glucose-Capillary 110 (*) 70 - 99 mg/dL   GLUCOSE, CAPILLARY     Status: Abnormal   Collection Time   01/16/12  7:22 AM      Component Value Range Comment   Glucose-Capillary 131 (*) 70 -  99 mg/dL    Comment 1 Notify RN       HEENT: L lower facial droop  Cardio: RRR  Resp: Clear to auscultation GI: BS positive  Extremity: Pulses positive and No Edema  Skin: Intact  Neuro: Awake oriented to place, month,situation, Cranial Nerve Abnormalities L central VII, Normal Sensory, Abnormal Motor 2-/5 L hand , 3-/5 Bi.tri,delt,HF,KE,ankle DF/PF, Dysarthric, Inattention and Other Left neglect no field cut  Musc/Skel: Normal and Other no shoulder pain  no ataxia  Still pocketing food   Assessment/Plan: 1. Functional deficits secondary to R MCA infarct which require 3+ hours per day of interdisciplinary therapy in a comprehensive inpatient rehab setting. Physiatrist is providing close team supervision and 24 hour management of active medical problems listed below. Physiatrist and rehab team continue to assess barriers to discharge/monitor patient progress toward functional and medical goals. FIM: FIM - Bathing Bathing Steps Patient Completed: Chest;Right Arm;Left Arm;Abdomen;Front perineal area;Buttocks;Right upper leg;Left upper leg;Right lower leg (including foot);Left lower leg (including foot) Bathing: 4: Steadying assist  FIM - Upper Body  Dressing/Undressing Upper body dressing/undressing steps patient completed: Thread/unthread right sleeve of pullover shirt/dresss;Put head through opening of pull over shirt/dress Upper body dressing/undressing: 3: Mod-Patient completed 50-74% of tasks FIM - Lower Body Dressing/Undressing Lower body dressing/undressing steps patient completed: Thread/unthread right underwear leg;Thread/unthread left underwear leg;Pull underwear up/down;Thread/unthread right pants leg;Thread/unthread left pants leg;Don/Doff right shoe;Don/Doff left shoe Lower body dressing/undressing: 3: Mod-Patient completed 50-74% of tasks  FIM - Toileting Toileting steps completed by patient: Adjust clothing prior to toileting;Performs perineal hygiene;Adjust clothing after toileting Toileting Assistive Devices: Grab bar or rail for support Toileting: 4: Steadying assist  FIM - Diplomatic Services operational officer Devices: Grab bars Toilet Transfers: 4-To toilet/BSC: Min A (steadying Pt. > 75%);4-From toilet/BSC: Min A (steadying Pt. > 75%)  FIM - Bed/Chair Transfer Bed/Chair Transfer Assistive Devices: Therapist, occupational: 5: Sit > Supine: Supervision (verbal cues/safety issues);4: Bed > Chair or W/C: Min A (steadying Pt. > 75%);4: Chair or W/C > Bed: Min A (steadying Pt. > 75%)  FIM - Locomotion: Wheelchair Distance: 120 Locomotion: Wheelchair: 0: Activity did not occur FIM - Locomotion: Ambulation Locomotion: Ambulation Assistive Devices: Chief Operating Officer Ambulation/Gait Assistance: 4: Min guard Locomotion: Ambulation: 4: Travels 150 ft or more with minimal assistance (Pt.>75%)  Comprehension Comprehension Mode: Auditory Comprehension: 3-Understands basic 50 - 74% of the time/requires cueing 25 - 50%  of the time  Expression Expression Mode: Verbal Expression: 5-Expresses basic 90% of the time/requires cueing < 10% of the time.  Social Interaction Social Interaction: 4-Interacts appropriately 75 -  89% of the time - Needs redirection for appropriate language or to initiate interaction.  Problem Solving Problem Solving: 3-Solves basic 50 - 74% of the time/requires cueing 25 - 49% of the time  Memory Memory: 3-Recognizes or recalls 50 - 74% of the time/requires cueing 25 - 49% of the time  . 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 DVT Prophylaxis/Anticoagulation: Chronic Coumadin therapy INR therapeutic on Pharm protocol. Monitor platelet counts any signs of bleeding  3. Neuropsych: This patient is not capable of making decisions on his/her own behalf.  4. Epilepsia partialis continua now controlled. Continue Klonopin 0.5 mg each bedtime, vimpat 300mg  bid,keppra 2500mg  Q12hour, Dilantin 100 mg 3 times a day. Monitor closely for a seizure activity. Wife would like a neurologist closer to home.  Wife will try calling Dr Ninetta Lights 5. Hypertension/atrial fibrillation of new onset. Lisinopril 5 mg daily. Cardiac rate  is controlled. There's been no reports of chest pain or shortness of breath   7. Dysphagia. Monitor for signs of aspiration 8. Non-insulin-dependent diabetes mellitus: Hemoglobin A1c 6.9.. Patient currently with sliding scale insulin.    LOS (Days) 20 A FACE TO FACE EVALUATION WAS PERFORMED  Ignacio Lowder E 01/16/2012, 8:04 AM

## 2012-01-16 NOTE — Progress Notes (Signed)
Physical Therapy Session Note  Patient Details  Name: Brendan Scott MRN: [id number] Date of Birth: 07-09-1945  Today's Date: 01/16/2012 Time: 4098-1191 Time Calculation (min): 54 min  Short Term Goals: Week 3:  PT Short Term Goal 1 (Week 3): = LTG  Skilled Therapeutic Interventions/Progress Updates:   Patient very fatigued this pm; L knee also noted to be very tight, increased edema but patient denies pain.  Secondary to knee limited ROM patient required min-mod A to stand from multiple surfaces.  Patient performed sit <> stand from w/c and multiple heights of chairs in ADL apartment, kitchen and day room with UE support and min-mod A to translate COG fully over BOS.  Patient performed gait x 150' x 3 reps with R hemi walker and min A overall with patient demonstrating improved memory of directions to ADL kitchen, day room and his room and performing changes in direction appropriately 90% of the time with min questioning cues; patient still presents with wide BOS, LLE ER, decreased stance time LLE and decreased step length LLE and increased LOB during changes in direction, side stepping and retro stepping.  Patient performed dynamic standing balance, attention to task, attention to L environment and visual field, sequencing, initiation and cessation in kitchen in standing placing sprinkles on two plates of cookies and then use of bilat UE to transfer plates of food to push cart; patient performed gait with bilat UE support on push car to transport food to day room for [employer].  Patient also stood and performed bilat UE task of removing plastic wrap from plates and setting up plates on food table with supervision-min A and min cues for sequencing/problem solving.    Therapy Documentation Precautions:  Precautions Precautions: Fall Precaution Comments: Very limited L knee flexion ROM  Restrictions Weight Bearing Restrictions: No Pain: Pain Assessment Pain Assessment: No/denies  pain Locomotion : Ambulation Ambulation/Gait Assistance: 4: Min guard   See FIM for current functional status  Therapy/Group: Individual Therapy  Edman Circle [facility] 01/16/2012, 3:03 PM

## 2012-01-16 NOTE — Progress Notes (Signed)
Occupational Therapy Note  Patient Details  Name: Brendan Scott MRN: [id number] Date of Birth: Oct 06, 1945 Today's Date: 01/16/2012  Time: 1130- 1200 (30 min)  Pt participated in self feeding group, [employer], with focus on BUE use for meal set up with opening containers and self feeding, task initiation, attention to task, swallowing strategies, and portion control. Pt became lethargic during meal and required verbal cues to remain engaged in session. Pt required verbal cues to perform tongue sweep to check for pocketed food and use of LUE to wipe mouth of spillage.  Pt required min cues for throat clear.  Wife present and able to provide cues with swallowing to increase safety with portion control.  Pt denies pain  Group Therapy    Leonette Monarch 01/16/2012, 1:16 PM

## 2012-01-16 NOTE — Progress Notes (Signed)
Social Work Patient ID: [name], male   DOB: Apr 10, 1945, 66 y.o.   MRN: [id number] Met with pt and wife to discuss follow up at discharge.  Team feels home health then transition to OP therapies.  Both agreeable and  Feel he is making good progress here.  Grandchildren to come Monday for family education prior to discharge.  Team conference tomorrow.

## 2012-01-16 NOTE — Progress Notes (Signed)
Speech Language Pathology Daily Session Note  Patient Details  Name: Brendan Scott MRN: [id number] Date of Birth: February 14, 1945  Today's Date: 01/16/2012 Time: 1200-1210 Time Calculation (min): 10 min  Short Term Goals: Week 3: SLP Short Term Goal 1 (Week 3): Patient will self-monitor and correct left anterior loss of textures and liquids during p.o. intake with mod assist verbal and question cues. SLP Short Term Goal 2 (Week 3): Patient will utilize swallowing compensatory strategies while consuming Dys.2 textures and thin liquids with mod assist verbal cues. SLP Short Term Goal 3 (Week 3): Patient will demonstrate selective attention to a functional task for 10 minutes with mod assist verbal and question cues for redirection. SLP Short Term Goal 4 (Week 3): Patient will attend to left enviornment/body with mod assist tactile and verbal cues. SLP Short Term Goal 5 (Week 3): Patient will maintain topic of conversation for ~3 turns with max assist verbal and question cues for redirection. SLP Short Term Goal 6 (Week 3): Patient will identify request help as neeed with mod assist verbal cues.   Skilled Therapeutic Interventions: Group session, co-treatment with OT; SLP focused on addressing dysphagia goals. Patient consumed Dys.1 textures and thin liquids with mod assist to self-monitor left pocketing and anterior loss of food during meal and to stop eating when coughing episodes occur. Continue with current plan of care.   FIM:  Comprehension Comprehension Mode: Auditory Comprehension: 3-Understands basic 50 - 74% of the time/requires cueing 25 - 50%  of the time Expression Expression Mode: Verbal Expression: 5-Expresses basic 90% of the time/requires cueing < 10% of the time. Social Interaction Social Interaction: 4-Interacts appropriately 75 - 89% of the time - Needs redirection for appropriate language or to initiate interaction. Problem Solving Problem Solving: 3-Solves basic 50 - 74% of  the time/requires cueing 25 - 49% of the time Memory Memory: 3-Recognizes or recalls 50 - 74% of the time/requires cueing 25 - 49% of the time FIM - Eating Eating Activity: 4: Helper checks for pocketed food  Pain Pain Assessment Pain Assessment: No/denies pain  Therapy/Group: Group Therapy  Charlane Ferretti., CCC-SLP [phone]  Brendan Scott 01/16/2012, 1:21 PM

## 2012-01-16 NOTE — Progress Notes (Signed)
Occupational Therapy Session Note  Patient Details  Name: Brendan Scott MRN: [id number] Date of Birth: 1945-08-21  Today's Date: 01/16/2012 Time: 0930-1030 Time Calculation (min): 60 min  Short Term Goals: Week 3:  OT Short Term Goal 1 (Week 3): Pt will complete 2 of 4 grooming tasks in standing with min assist. OT Short Term Goal 2 (Week 3): Pt will complete toileting with min assist (3/3 steps with steadying assistance) OT Short Term Goal 3 (Week 3): Pt will demonstrate increased grasp with Lt hand by pulling up pants in standing. OT Short Term Goal 4 (Week 3): Pt will complete UB dressing with min assist 3/4 times. OT Short Term Goal 5 (Week 3): Pt will complete LB dressing with min assist  Skilled Therapeutic Interventions/Progress Updates:    Pt seen for ADL retraining at sink per pt's request.  Pt required max verbal and tactile cues for cessation of activities due to perseveration with donning shirt and fastening shoes with shoe button.  Pt with increased sequencing with bathing, requiring intermittent hand over hand assist with bathing and dressing to maintain grasp on washcloth and pants secondary to decreased grasp strength and attention and multiple drops of wash cloth when using Lt hand.  Pt required verbal and tactile cues for attention to Lt to obtain items from sink to complete grooming and when bathing.  Pt's wife present at end of session and educated her on use of shoe buttons.  Pt's wife was able to observe pt with increased perseveration with donning shoes and the increased cues and assistance he required with that singular task.  Therapy Documentation Precautions:  Precautions Precautions: Fall Precaution Comments: Very limited L knee flexion ROM  Restrictions Weight Bearing Restrictions: No Pain:  Pt with no c/o pain this session.  See FIM for current functional status  Therapy/Group: Individual Therapy  Leonette Monarch 01/16/2012, 10:54 AM

## 2012-01-16 NOTE — Progress Notes (Signed)
Speech Language Pathology Daily Session Note  Patient Details  Name: Brendan Scott MRN: [id number] Date of Birth: [birth year]  Today's Date: 01/16/2012 Time: 1130-1200 Time Calculation (min): 30 min  Short Term Goals: Week 3: SLP Short Term Goal 1 (Week 3): Patient will self-monitor and correct left anterior loss of textures and liquids during p.o. intake with mod assist verbal and question cues. SLP Short Term Goal 2 (Week 3): Patient will utilize swallowing compensatory strategies while consuming Dys.2 textures and thin liquids with mod assist verbal cues. SLP Short Term Goal 3 (Week 3): Patient will demonstrate selective attention to a functional task for 10 minutes with mod assist verbal and question cues for redirection. SLP Short Term Goal 4 (Week 3): Patient will attend to left enviornment/body with mod assist tactile and verbal cues. SLP Short Term Goal 5 (Week 3): Patient will maintain topic of conversation for ~3 turns with max assist verbal and question cues for redirection. SLP Short Term Goal 6 (Week 3): Patient will identify request help as neeed with mod assist verbal cues.   Skilled Therapeutic Interventions: Skilled therapy focused on addressing dysphagia and cognition during various self-care tasks. SLP facilitated session with breakfast; patient consumed Dys.1 textures and thin liquids with mod assist to self monitor and correct left sided buccal residue and left anterior loss of bolus.  SLP also facilitated session with trials of Dys.3 textures with max faded to mod assist cues to attend to mastication and compensate for left sided buccal pocketing. Recommend trial upgrade with breakfast tomorrow.   FIM:  Comprehension Comprehension Mode: Auditory Comprehension: 3-Understands basic 50 - 74% of the time/requires cueing 25 - 50%  of the time Expression Expression Mode: Verbal Expression: 5-Expresses basic 90% of the time/requires cueing < 10% of the time. Social  Interaction Social Interaction: 4-Interacts appropriately 75 - 89% of the time - Needs redirection for appropriate language or to initiate interaction. Problem Solving Problem Solving: 3-Solves basic 50 - 74% of the time/requires cueing 25 - 49% of the time Memory Memory: 3-Recognizes or recalls 50 - 74% of the time/requires cueing 25 - 49% of the time FIM - Eating Eating Activity: 4: Helper checks for pocketed food  Pain Pain Assessment Pain Assessment: No/denies pain  Therapy/Group: Individual Therapy  Charlane Ferretti., CCC-SLP [phone]  Willim Turnage 01/16/2012, 1:17 PM

## 2012-01-17 ENCOUNTER — Inpatient Hospital Stay (HOSPITAL_COMMUNITY): Payer: BC Managed Care – PPO | Admitting: Occupational Therapy

## 2012-01-17 ENCOUNTER — Inpatient Hospital Stay (HOSPITAL_COMMUNITY): Payer: BC Managed Care – PPO | Admitting: Physical Therapy

## 2012-01-17 ENCOUNTER — Inpatient Hospital Stay (HOSPITAL_COMMUNITY): Payer: BC Managed Care – PPO | Admitting: Speech Pathology

## 2012-01-17 LAB — GLUCOSE, CAPILLARY
Glucose-Capillary: 129 mg/dL — ABNORMAL HIGH (ref 70–99)
Glucose-Capillary: 70 mg/dL (ref 70–99)
Glucose-Capillary: 125 mg/dL — ABNORMAL HIGH (ref 70–99)

## 2012-01-17 LAB — PROTIME-INR
INR: 2.54 — ABNORMAL HIGH (ref 0.00–1.49)
Prothrombin Time: 26.1 seconds — ABNORMAL HIGH (ref 11.6–15.2)

## 2012-01-17 NOTE — Progress Notes (Signed)
Physical Therapy Weekly Progress Note  Patient Details  Name: Brendan Scott MRN: [id number] Date of Birth: 12-18-1945  Today's Date: 01/17/2012 Time: 1300-1400 Time Calculation (min): 60 min  Patient has met 3 of 9 long term goals.  Short term goals not set for week 3 due to estimated length of stay.  Patient continues to make steady progress with all functional mobility, attention, sequencing, initiation and cessation during functional mobility and is currently supervision-min A overall for bed mobility on flat bed, bed <> w/c transfers, car transfers, gait with hemi walker and stair negotiation.  Attempted to progress patient to [facility] but secondary to novelty of AD patient required mod-max physical assistance and total verbal and visual cues for safety and sequencing.  When patient is fatigued he does require increased physical and cognitive assistance to safely complete functional mobility tasks.  Patient to D/C home with wife and grandson's on 01/13/12 and family education to begin this weekend into next week.    Patient continues to demonstrate the following deficits: impaired activity tolerance, endurance, postural control, dynamic standing balance, L sided weakness and decreased ROM, impaired gait, cognitive impairments including impaired initiation/cessation, apraxia/sequencing, attention and therefore will continue to benefit from skilled PT intervention to enhance overall performance with activity tolerance, balance, postural control, ability to compensate for deficits, functional use of  left upper extremity and left lower extremity, attention, awareness and coordination.  See Patient's Care Plan for progression toward long term goals.  Patient progressing toward long term goals..  Continue plan of care.  Skilled Therapeutic Interventions/Progress Updates:   Patient's wife present first part of therapy session; scheduled formal education session with wife for this Sunday.  Patient very  fatigued this pm; performed sit > stand from w/c with min A to bring COG fully over BOS secondary to decreased ROM L knee.  Performed gait with hemi walker in controlled environment x 150' x 2 with min-mod A at times secondary to forward LOB with increased momentum, pushing to L and L lateral lean and LOB; required increased tactile and verbal cues for upright posture.  Performed prolonged supine stretches of L hip flexor, quad and trunk muscles for increased available ROM for transfers and gait.  Performed gait assessment with SPC x 50'; patient required increased physical assistance to perform safely and total verbal, visual and tactile cues for safety, sequencing, for full step/stride length and trunk positioning secondary to patient keeping L trunk, LE and pelvis retracted and stepping laterally to the R; patient also noted to be using cane to push self to L; removed cane and returned to gym with L HHA and verbal cues for continuous stepping.  Performed dynamic standing balance training with squat with rotation with RUE reaching low and across midline for horseshoe and then placing horseshoe on tall target on R for R lateral weight shifting and extension through LLE with mod A; continued with performing sit <> stand from mat and then reaching up and forwards with LUE to remove horseshoes from tall target for full trunk, hip, knee extension through L side and facilitation of LUE use and sustained grip during stand >sit x 5 reps with min-mod A.  Patient required multiple rest breaks secondary to fatigue today.  Therapy Documentation Precautions:  Precautions Precautions: Fall Precaution Comments: impulsive Restrictions Weight Bearing Restrictions: No Pain: Pain Assessment Pain Assessment: No/denies pain Locomotion : Ambulation Ambulation/Gait Assistance: 4: Min assist   See FIM for current functional status  Therapy/Group: Individual Therapy  Edman Circle  [place] 01/17/2012, 2:33 PM

## 2012-01-17 NOTE — Patient Care Conference (Signed)
Inpatient RehabilitationTeam Conference Note Date: 01/17/2012   Time: 10:45 AM    Patient Name: Brendan Scott      Medical Record Number: [id number]  Date of Birth: 1945/05/18 Sex: Male         Room/Bed: 4034/4034-01 Payor Info: Payor: MEDICARE  Plan: MEDICARE PART A AND B  Product Type: *No Product type*     Admitting Diagnosis: RT CVA/SEIZURES AND BRAIN TUMOR  Admit Date/Time:  12/27/2011  6:26 PM Admission Comments: No comment available   Primary Diagnosis:  CVA (cerebral infarction) Principal Problem: CVA (cerebral infarction)  Patient Active Problem List   Diagnosis Date Noted  . CVA (cerebral infarction) 12/28/2011  . Respiratory distress 12/25/2011  . Aspiration into respiratory tract 12/24/2011  . Embolic stroke involving middle cerebral artery 12/21/2011  . Focal motor seizure 12/16/2011  . Pain, knee 10/18/2010  . S/P total knee replacement 10/18/2010  . Diabetes mellitus   . OSTEOARTHRITIS, KNEE, LEFT 07/12/2009  . TOTAL KNEE FOLLOW-UP 08/13/2007  . JOINT EFFUSION, KNEE 06/17/2007  . OSTEOARTHRITIS, LOWER LEG 12/05/2006  . Pain in Joint, Lower Leg 12/05/2006  . SCIATICA, RIGHT 12/05/2006  . DIABETES 10/31/2006    Expected Discharge Date: Expected Discharge Date: 01/23/12  Team Members Present: Physician leading conference: Dr. Claudette Laws Social Worker Present: Dossie Der, LCSW Nurse Present: Rosalio Macadamia, RN PT Present: Edman Circle, PT;Becky Henrene Dodge, PT;Other (comment) (Brdiget Ripa-PT) OT Present: Bretta Bang, OT SLP Present: Fae Pippin, SLP     Current Status/Progress Goal Weekly Team Focus  Medical   Now is continent of bladder  remain seizure free, educate wife on diet and swallowing and meds  family ed   Bowel/Bladder   Pt continent of bowel and bladder         Swallow/Nutrition/ Hydration   Dys.2 textures and thin liquids  least restrictive p.o. intake  increase carryover and self monitoring   ADL's   min/steady assist bathing, mod  assist UB dressing, mod assist LB dressing, min assist transfers, mod assist toileting.  Pt requires mod-max verbal and tactile cues for sequencing, initiaition, and cessation of perseveration with self-care tasks  min assist overall, mod assist toileting  safety with transfers and sit <> stand, ADL retraining with focus on initiation, cessation, sequencing, and problem solving, LUE use in self-care tasks taken , family education    Mobility   supervision-min A depending on arousal level  supervision-min A overall  gait, balance, cognition and safety with all mobility   Communication   supervision-min assist  supervision  increase initiation   Safety/Cognition/ Behavioral Observations  mod assist  min assist   increase initiation and cessation; increase left attention    Pain             Skin   skin intact  remain intact  continue to monitor      *See Interdisciplinary Assessment and Plan and progress notes for long and short-term goals  Barriers to Discharge: family ed needed but caregiver schedule is limited    Possible Resolutions to Barriers:  try weekend family ed    Discharge Planning/Teaching Needs:  Home with wife and grandchildren to assist.  Grandchildren to come in Monday for fmaily education.  Wife here daily participating in pt's care      Team Discussion:  Pt is cont now, continues to be more alert.  Follow up with Neuro MD regarding seizures meds.  On-going family education  Revisions to Treatment Plan:  None   Continued Need for  Acute Rehabilitation Level of Care: The patient requires daily medical management by a physician with specialized training in physical medicine and rehabilitation for the following conditions: Daily direction of a multidisciplinary physical rehabilitation program to ensure safe treatment while eliciting the highest outcome that is of practical value to the patient.: Yes Daily medical management of patient stability for increased activity  during participation in an intensive rehabilitation regime.: Yes Daily analysis of laboratory values and/or radiology reports with any subsequent need for medication adjustment of medical intervention for : Neurological problems  Harlei Lehrmann, Lemar Livings 01/18/2012, 9:15 AM

## 2012-01-17 NOTE — Progress Notes (Signed)
Occupational Therapy Session Note  Patient Details  Name: Brendan Scott MRN: [id number] Date of Birth: 1945-06-12  Today's Date: 01/17/2012 Time: 1001-1100 Time Calculation (min): 59 min  Short Term Goals: Week 3:  OT Short Term Goal 1 (Week 3): Pt will complete 2 of 4 grooming tasks in standing with min assist. OT Short Term Goal 2 (Week 3): Pt will complete toileting with min assist (3/3 steps with steadying assistance) OT Short Term Goal 3 (Week 3): Pt will demonstrate increased grasp with Lt hand by pulling up pants in standing. OT Short Term Goal 4 (Week 3): Pt will complete UB dressing with min assist 3/4 times. OT Short Term Goal 5 (Week 3): Pt will complete LB dressing with min assist  Skilled Therapeutic Interventions/Progress Updates:    Performed bathing and dressing session sit to stand at the sink.  Pt able to perform most of bathing with only min assist.  Demonstrates disorganized pattern of performing activity but is able to complete.  Multiple times pt would drop the washcloth with his left hand secondary to decreased hand function but would not wait for therapist to try and help him.  Very impulsive through session.  During dressing tasks therapist attempted multiple times to re-direct pt when he was unsuccessful with attempting to donn shirt, pants, or use his shoe buttons.  He would not respond when therapist told him to stop.  Eventually after multiple times therapist had to physically make him stop in order for him to realize he was not using the right technique or sequence to complete the task.  Pt would become slightly upset and request his wife, who was not in the room at the time.  He was however able to perform sit to stand with LB selfcare and only min facilitation.    Therapy Documentation Precautions:  Precautions Precautions: Fall Precaution Comments: impulsive Restrictions Weight Bearing Restrictions: No  Pain: Pain Assessment Pain Assessment: No/denies  pain ADL: See FIM for current functional status  Therapy/Group: Individual Therapy  Valentin Benney OTR/L 01/17/2012, 12:47 PM

## 2012-01-17 NOTE — Progress Notes (Signed)
Patient ID: Brendan Scott, male   DOB: 09/25/1945, 67 y.o.   MRN: [id number] Subjective/Complaints: No seizures Very motivated for therapy  Sleeping OK Review of Systems  Neurological: Positive for focal weakness.  All other systems reviewed and are negative.     Objective: Vital Signs: Blood pressure 136/77, pulse 65, temperature 97.4 F (36.3 C), temperature source Oral, resp. rate 18, height 5\' 6"  (1.676 m), weight 90.5 kg (199 lb 8.3 oz), SpO2 100.00%. No results found. Results for orders placed during the hospital encounter of 12/27/11 (from the past 72 hour(s))  GLUCOSE, CAPILLARY     Status: Abnormal   Collection Time   01/14/12 12:06 PM      Component Value Range Comment   Glucose-Capillary 152 (*) 70 - 99 mg/dL   GLUCOSE, CAPILLARY     Status: Abnormal   Collection Time   01/14/12  4:51 PM      Component Value Range Comment   Glucose-Capillary 107 (*) 70 - 99 mg/dL   PROTIME-INR     Status: Abnormal   Collection Time   01/15/12  5:40 AM      Component Value Range Comment   Prothrombin Time 26.3 (*) 11.6 - 15.2 seconds    INR 2.56 (*) 0.00 - 1.49   GLUCOSE, CAPILLARY     Status: Abnormal   Collection Time   01/15/12  7:23 AM      Component Value Range Comment   Glucose-Capillary 115 (*) 70 - 99 mg/dL    Comment 1 Notify RN     GLUCOSE, CAPILLARY     Status: Abnormal   Collection Time   01/15/12 11:12 AM      Component Value Range Comment   Glucose-Capillary 190 (*) 70 - 99 mg/dL    Comment 1 Notify RN     GLUCOSE, CAPILLARY     Status: Abnormal   Collection Time   01/15/12  4:39 PM      Component Value Range Comment   Glucose-Capillary 140 (*) 70 - 99 mg/dL    Comment 1 Notify RN     GLUCOSE, CAPILLARY     Status: Abnormal   Collection Time   01/15/12  9:16 PM      Component Value Range Comment   Glucose-Capillary 110 (*) 70 - 99 mg/dL   GLUCOSE, CAPILLARY     Status: Abnormal   Collection Time   01/16/12  7:22 AM      Component Value Range Comment    Glucose-Capillary 131 (*) 70 - 99 mg/dL    Comment 1 Notify RN     GLUCOSE, CAPILLARY     Status: Abnormal   Collection Time   01/16/12 11:18 AM      Component Value Range Comment   Glucose-Capillary 196 (*) 70 - 99 mg/dL    Comment 1 Notify RN     GLUCOSE, CAPILLARY     Status: Abnormal   Collection Time   01/16/12  5:14 PM      Component Value Range Comment   Glucose-Capillary 112 (*) 70 - 99 mg/dL   GLUCOSE, CAPILLARY     Status: Abnormal   Collection Time   01/16/12  9:13 PM      Component Value Range Comment   Glucose-Capillary 133 (*) 70 - 99 mg/dL   PROTIME-INR     Status: Abnormal   Collection Time   01/17/12  6:18 AM      Component Value Range Comment   Prothrombin Time 26.1 (*)  11.6 - 15.2 seconds    INR 2.54 (*) 0.00 - 1.49   GLUCOSE, CAPILLARY     Status: Abnormal   Collection Time   01/17/12  7:17 AM      Component Value Range Comment   Glucose-Capillary 129 (*) 70 - 99 mg/dL    Comment 1 Notify RN       HEENT: L lower facial droop  Cardio: RRR  Resp: Clear to auscultation GI: BS positive  Extremity: Pulses positive and No Edema  Skin: Intact  Neuro: Awake oriented to place, month,situation, Cranial Nerve Abnormalities L central VII, Normal Sensory, Abnormal Motor 2-/5 L hand , 3-/5 Bi.tri,delt,HF,KE,ankle DF/PF, Dysarthric, Inattention and Other Left neglect no field cut  Musc/Skel: Normal and Other no shoulder pain  no ataxia  Still pocketing food  Labs reviewed Assessment/Plan: 1. Functional deficits secondary to R MCA infarct which require 3+ hours per day of interdisciplinary therapy in a comprehensive inpatient rehab setting. Physiatrist is providing close team supervision and 24 hour management of active medical problems listed below. Physiatrist and rehab team continue to assess barriers to discharge/monitor patient progress toward functional and medical goals. FIM: FIM - Bathing Bathing Steps Patient Completed: Chest;Right Arm;Left  Arm;Abdomen;Front perineal area;Buttocks;Right upper leg;Left upper leg;Right lower leg (including foot);Left lower leg (including foot) Bathing: 4: Steadying assist  FIM - Upper Body Dressing/Undressing Upper body dressing/undressing steps patient completed: Thread/unthread right sleeve of pullover shirt/dresss;Put head through opening of pull over shirt/dress Upper body dressing/undressing: 3: Mod-Patient completed 50-74% of tasks FIM - Lower Body Dressing/Undressing Lower body dressing/undressing steps patient completed: Thread/unthread right underwear leg;Thread/unthread left underwear leg;Pull underwear up/down;Thread/unthread right pants leg;Thread/unthread left pants leg;Don/Doff right shoe;Don/Doff left shoe;Fasten/unfasten left shoe Lower body dressing/undressing: 3: Mod-Patient completed 50-74% of tasks  FIM - Toileting Toileting steps completed by patient: Adjust clothing prior to toileting;Performs perineal hygiene;Adjust clothing after toileting Toileting Assistive Devices: Grab bar or rail for support Toileting: 4: Steadying assist  FIM - Diplomatic Services operational officer Devices: Grab bars Toilet Transfers: 4-To toilet/BSC: Min A (steadying Pt. > 75%);4-From toilet/BSC: Min A (steadying Pt. > 75%)  FIM - Bed/Chair Transfer Bed/Chair Transfer Assistive Devices: Therapist, occupational: 4: Bed > Chair or W/C: Min A (steadying Pt. > 75%);4: Chair or W/C > Bed: Min A (steadying Pt. > 75%)  FIM - Locomotion: Wheelchair Distance: 120 Locomotion: Wheelchair: 0: Activity did not occur FIM - Locomotion: Ambulation Locomotion: Ambulation Assistive Devices: Chief Operating Officer Ambulation/Gait Assistance: 4: Min guard Locomotion: Ambulation: 4: Travels 150 ft or more with minimal assistance (Pt.>75%)  Comprehension Comprehension Mode: Auditory Comprehension: 3-Understands basic 50 - 74% of the time/requires cueing 25 - 50%  of the time  Expression Expression Mode:  Verbal Expression: 5-Expresses basic 90% of the time/requires cueing < 10% of the time.  Social Interaction Social Interaction: 4-Interacts appropriately 75 - 89% of the time - Needs redirection for appropriate language or to initiate interaction.  Problem Solving Problem Solving: 3-Solves basic 50 - 74% of the time/requires cueing 25 - 49% of the time  Memory Memory: 3-Recognizes or recalls 50 - 74% of the time/requires cueing 25 - 49% of the time  . 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 DVT Prophylaxis/Anticoagulation: Chronic Coumadin therapy INR therapeutic on Pharm protocol. Monitor platelet counts any signs of bleeding  3. Neuropsych: This patient is not capable of making decisions on his/her own behalf.  4. Epilepsia partialis continua now controlled. Continue Klonopin 0.5 mg each bedtime,  vimpat 300mg  bid,keppra 2500mg  Q12hour, Dilantin 100 mg 3 times a day. Monitor closely for a seizure activity. Wife would like a neurologist closer to home.  Wife will try calling Dr Ninetta Lights 5. Hypertension/atrial fibrillation of new onset. Lisinopril 5 mg daily. Cardiac rate is controlled. There's been no reports of chest pain or shortness of breath   7. Dysphagia. Monitor for signs of aspiration 8. Non-insulin-dependent diabetes mellitus: Hemoglobin A1c 6.9.. Patient currently with sliding scale insulin.    LOS (Days) 21 A FACE TO FACE EVALUATION WAS PERFORMED  KIRSTEINS,ANDREW E 01/17/2012, 8:38 AM

## 2012-01-17 NOTE — Progress Notes (Signed)
ANTICOAGULATION CONSULT NOTE - Follow Up Consult Pharmacy Consult:  Coumadin Indication: atrial fibrillation  No Known Allergies  Patient Measurements: Ht: 66", Wt: 95.5 Kg  Vital Signs: Temp: 97.4 F (36.3 C) (12/18 0607) Temp src: Oral (12/18 0607) BP: 136/77 mmHg (12/18 0607) Pulse Rate: 65  (12/18 0607)  Labs:  Alvira Philips 01/17/12 0618 01/15/12 0540  HGB -- --  HCT -- --  PLT -- --  APTT -- --  LABPROT 26.1* 26.3*  INR 2.54* 2.56*  HEPARINUNFRC -- --  CREATININE -- --  CKTOTAL -- --  CKMB -- --  TROPONINI -- --   Assessment: 66 y/o male patient with a history of Afib who was on ASA 81 mg PTA and admitted with a stroke, not a candidate for TPA 2nd mass lesion on head CT.  Patient continues on Coumadin therapy with therapeutic INR.  No bleeding documented. CBC stable on 12/11. Noted Dilantin therapy could increase the effect of Coumadin.  He continues on Keppra as well.  Goal of Therapy:  INR 2-3   Plan:  - Coumadin 2.5mg  PO daily except 5mg  on Tues / Fri - PT/INR M/W/F  Christoper Fabian, PharmD, BCPS Clinical pharmacist, pager [phone] 01/17/2012 12:51 PM

## 2012-01-17 NOTE — Progress Notes (Addendum)
Speech Language Pathology Daily Session Note  Patient Details  Name: Brendan Scott MRN: [id number] Date of Birth: 01-10-1946  Today's Date: 01/17/2012 Time: 4098-1191 Time Calculation (min): 45 min  Short Term Goals: Week 3: SLP Short Term Goal 1 (Week 3): Patient will self-monitor and correct left anterior loss of textures and liquids during p.o. intake with mod assist verbal and question cues. SLP Short Term Goal 2 (Week 3): Patient will utilize swallowing compensatory strategies while consuming Dys.2 textures and thin liquids with mod assist verbal cues. SLP Short Term Goal 3 (Week 3): Patient will demonstrate selective attention to a functional task for 10 minutes with mod assist verbal and question cues for redirection. SLP Short Term Goal 4 (Week 3): Patient will attend to left enviornment/body with mod assist tactile and verbal cues. SLP Short Term Goal 5 (Week 3): Patient will maintain topic of conversation for ~3 turns with max assist verbal and question cues for redirection. SLP Short Term Goal 6 (Week 3): Patient will identify request help as neeed with mod assist verbal cues.   Skilled Therapeutic Interventions: Skilled therapy focused on addressing dysphagia and cognition during various self-care tasks. SLP facilitated session with breakfast; patient consumed Dys.3 textures and thin liquids with mod assist to self-monitor and correct left sided buccal residue and left anterior loss of bolus. Patient with intermittent throat clears and SLP cues for hard effortful coughs.  SLP also facilitated session with min assist increased to mod assist verbal cues to select attention to meal; cues were increased due to increased fatigue.  Continue with current plan of care.    FIM:  Comprehension Comprehension Mode: Auditory Comprehension: 3-Understands basic 50 - 74% of the time/requires cueing 25 - 50%  of the time Expression Expression Mode: Verbal Expression: 5-Expresses basic 90% of the  time/requires cueing < 10% of the time. Social Interaction Social Interaction: 4-Interacts appropriately 75 - 89% of the time - Needs redirection for appropriate language or to initiate interaction. Problem Solving Problem Solving: 3-Solves basic 50 - 74% of the time/requires cueing 25 - 49% of the time Memory Memory: 3-Recognizes or recalls 50 - 74% of the time/requires cueing 25 - 49% of the time FIM - Eating Eating Activity: 4: Helper checks for pocketed food  Pain Pain Assessment Pain Assessment: No/denies pain  Therapy/Group: Individual Therapy  Charlane Ferretti., CCC-SLP [phone]  Chakira Jachim 01/17/2012, 11:17 AM

## 2012-01-17 NOTE — Progress Notes (Signed)
Pt refused all his medications tonight. Staff explain why the medications are ordered and the importance of taking them.

## 2012-01-17 NOTE — Progress Notes (Signed)
Speech Language Pathology Daily Session Note  Patient Details  Name: Brendan Scott MRN: [id number] Date of Birth: [birth year]  Today's Date: 01/17/2012 Time: 1200-1220 Time Calculation (min): 20 min  Short Term Goals: Week 3: SLP Short Term Goal 1 (Week 3): Patient will self-monitor and correct left anterior loss of textures and liquids during p.o. intake with mod assist verbal and question cues. SLP Short Term Goal 2 (Week 3): Patient will utilize swallowing compensatory strategies while consuming Dys.2 textures and thin liquids with mod assist verbal cues. SLP Short Term Goal 3 (Week 3): Patient will demonstrate selective attention to a functional task for 10 minutes with mod assist verbal and question cues for redirection. SLP Short Term Goal 4 (Week 3): Patient will attend to left enviornment/body with mod assist tactile and verbal cues. SLP Short Term Goal 5 (Week 3): Patient will maintain topic of conversation for ~3 turns with max assist verbal and question cues for redirection. SLP Short Term Goal 6 (Week 3): Patient will identify request help as neeed with mod assist verbal cues.   Skilled Therapeutic Interventions: Group session, co-treatment with OT; SLP focused on addressing dysphagia goals. Patient consumed Dys.2 textures and thin liquids with max faded to mod assist cues to self-monitor left pocketing and anterior loss of food during meal and to stop eating to complete mastication and lingual sweeps to remove oral residue between bites.  Patient exhibited intermittent throat clears and SLP cued for hard effortful coughs which appeared to be effective at removing suspected penetrates.  Continue with current plan of care.   FIM:  Comprehension Comprehension Mode: Auditory Comprehension: 3-Understands basic 50 - 74% of the time/requires cueing 25 - 50%  of the time Expression Expression Mode: Verbal Expression: 5-Expresses basic 90% of the time/requires cueing < 10% of the  time. Social Interaction Social Interaction: 4-Interacts appropriately 75 - 89% of the time - Needs redirection for appropriate language or to initiate interaction. Problem Solving Problem Solving: 3-Solves basic 50 - 74% of the time/requires cueing 25 - 49% of the time Memory Memory: 3-Recognizes or recalls 50 - 74% of the time/requires cueing 25 - 49% of the time FIM - Eating Eating Activity: 4: Helper checks for pocketed food  Pain Pain Assessment Pain Assessment: No/denies pain  Therapy/Group: Group Therapy  Charlane Ferretti., CCC-SLP [phone]  Tyronne Blann 01/17/2012, 1:16 PM

## 2012-01-17 NOTE — Progress Notes (Signed)
Occupational Therapy Note  Patient Details  Name: Brendan Scott MRN: [id number] Date of Birth: 1945-09-28 Today's Date: 01/17/2012 . Time: 1130- 1200 (30 min)  Patient seen today in [employer] to address safety, LUE use, and visual attention during a self feeding activity.  Patient's wife attended group today and assisted with cueing patient to slow down rate of intake, and also to reduce bite / sip size.  Patient using LUE to wipe mouth and assist with set up.  Pt denies pain   Group Therapy     Joyia Riehle 01/17/2012, 4:21 PM

## 2012-01-18 ENCOUNTER — Inpatient Hospital Stay (HOSPITAL_COMMUNITY): Payer: BC Managed Care – PPO | Admitting: Occupational Therapy

## 2012-01-18 ENCOUNTER — Inpatient Hospital Stay (HOSPITAL_COMMUNITY): Payer: BC Managed Care – PPO | Admitting: Speech Pathology

## 2012-01-18 ENCOUNTER — Inpatient Hospital Stay (HOSPITAL_COMMUNITY): Payer: BC Managed Care – PPO | Admitting: *Deleted

## 2012-01-18 LAB — GLUCOSE, CAPILLARY: Glucose-Capillary: 164 mg/dL — ABNORMAL HIGH (ref 70–99)

## 2012-01-18 NOTE — Progress Notes (Signed)
Speech Language Pathology Daily Session Note  Patient Details  Name: CAYNE YOM MRN: [id number] Date of Birth: [birth year]  Today's Date: 01/18/2012 Time: 0830-0900 Time Calculation (min): 30 min  Short Term Goals: Week 3: SLP Short Term Goal 1 (Week 3): Patient will self-monitor and correct left anterior loss of textures and liquids during p.o. intake with mod assist verbal and question cues. SLP Short Term Goal 2 (Week 3): Patient will utilize swallowing compensatory strategies while consuming Dys.2 textures and thin liquids with mod assist verbal cues. SLP Short Term Goal 3 (Week 3): Patient will demonstrate selective attention to a functional task for 10 minutes with mod assist verbal and question cues for redirection. SLP Short Term Goal 4 (Week 3): Patient will attend to left enviornment/body with mod assist tactile and verbal cues. SLP Short Term Goal 5 (Week 3): Patient will maintain topic of conversation for ~3 turns with max assist verbal and question cues for redirection. SLP Short Term Goal 6 (Week 3): Patient will identify request help as neeed with mod assist verbal cues.   Skilled Therapeutic Interventions: Skilled therapy focused on addressing dysphagia goals during self-feeding. SLP facilitated session with breakfast; patient consumed Dys.2 textures and thin liquids with min increased to mod assist verbal cues to self-monitor and correct left sided buccal residue and left anterior loss of bolus while alternating attention between conversation with brother and task. Patient with intermittent hard effortful coughs; difficult to rule out carryover of strategy vs. sensed penetration. Continue with current plan of care. SLP educated brother on diet and use of compensatory strategies for safety with p.o.   FIM:  Comprehension Comprehension Mode: Auditory Comprehension: 3-Understands basic 50 - 74% of the time/requires cueing 25 - 50%  of the time Expression Expression Mode:  Verbal Expression: 5-Expresses basic 90% of the time/requires cueing < 10% of the time. Social Interaction Social Interaction: 4-Interacts appropriately 75 - 89% of the time - Needs redirection for appropriate language or to initiate interaction. Problem Solving Problem Solving: 3-Solves basic 50 - 74% of the time/requires cueing 25 - 49% of the time Memory Memory: 3-Recognizes or recalls 50 - 74% of the time/requires cueing 25 - 49% of the time FIM - Eating Eating Activity: 4: Helper checks for pocketed food  Pain Pain Assessment Pain Assessment: No/denies pain  Therapy/Group: Group Therapy  Charlane Ferretti., CCC-SLP [phone]  Jacki Couse 01/18/2012, 1:35 PM

## 2012-01-18 NOTE — Progress Notes (Signed)
Occupational Therapy Note  Patient Details  Name: Brendan Scott MRN: [id number] Date of Birth: 02/25/1945 Today's Date: 01/18/2012  Time: 1200-1215 (cotx with Speech Therapy-group time [phone]) Pt denies pain Group Therapy  Pt participated in self feeding group with focus on use of LUE to assist with meal setup and and assist with self feeding, swallowing strategies, and portion control.  Pt initiates use of LUE but requires physical assist.  Pt required min verbal cues to check for pocketed food in left side of mouth.   Lavone Neri [facility] 01/18/2012, 3:55 PM

## 2012-01-18 NOTE — Progress Notes (Signed)
Physical Therapy Session Note  Patient Details  Name: Brendan Scott MRN: [id number] Date of Birth: 11/20/1945  Today's Date: 01/18/2012 Time: 4696-2952 Time Calculation (min): 41 min  Short Term Goals: Week 3:  PT Short Term Goal 1 (Week 3): = LTG  Skilled Therapeutic Interventions/Progress Updates:    Patient received sitting in wheelchair. Patient with requests to use restroom. Patient ambulated to bathroom with rolling walker and L hand orthosis. Patient initiates ambulation before L hand is secure in hand orthosis and requires assistance to properly place. Patient requires min assist to ambulate to bathroom and to transfer to toilet. Patient requires assistance to manage left side of pants and boxers prior to sitting on toilet. Patient able to perform hygiene independently with min assist for steadying. Patient ambulated to wheelchair with rolling walker.  Patient instructed in gait training x140' with hemi-walker and instructed in cognitive task during gait training. Patient able to initiate and maintain cognitive task until fire alarm goes off. Patient becomes increasingly distracted from fire alarm and requires increased verbal and tactile cues to turn and return to wheelchair.  Patient returned to room and left seated in wheelchair with seatbelt donned.  Therapy Documentation Precautions:  Precautions Precautions: Fall Precaution Comments: impulsive Restrictions Weight Bearing Restrictions: No Pain: Pain Assessment Pain Assessment: No/denies pain Pain Score: 0-No pain Mobility: Transfers Sit to Stand: 4: Min assist;From chair/3-in-1;With armrests Sit to Stand Details: Verbal cues for precautions/safety;Verbal cues for technique;Verbal cues for sequencing Stand to Sit: 4: Min assist;To chair/3-in-1;With armrests Stand to Sit Details (indicate cue type and reason): Verbal cues for precautions/safety;Verbal cues for technique;Verbal cues for sequencing Locomotion  : Ambulation Ambulation/Gait Assistance: 4: Min assist Assistive device: Rolling walker;Hemi-walker Ambulation/Gait Assistance Details: Verbal cues for gait pattern;Tactile cues for initiation Gait Gait: Yes Gait Pattern: Step-through pattern;Decreased step length - right;Decreased stance time - right;Decreased stride length;Narrow base of support;Trunk flexed Wheelchair Mobility Distance: 150   See FIM for current functional status  Therapy/Group: Individual Therapy  Chipper Herb. Ronit Cranfield, PT, DPT  01/18/2012, 4:46 PM

## 2012-01-18 NOTE — Progress Notes (Signed)
Speech Language Pathology Daily Session Note  Patient Details  Name: Brendan Scott MRN: [id number] Date of Birth: 09/13/1945  Today's Date: 01/18/2012 Time: 1130-1200 Time Calculation (min): 30 min  Short Term Goals: Week 3: SLP Short Term Goal 1 (Week 3): Patient will self-monitor and correct left anterior loss of textures and liquids during p.o. intake with mod assist verbal and question cues. SLP Short Term Goal 2 (Week 3): Patient will utilize swallowing compensatory strategies while consuming Dys.2 textures and thin liquids with mod assist verbal cues. SLP Short Term Goal 3 (Week 3): Patient will demonstrate selective attention to a functional task for 10 minutes with mod assist verbal and question cues for redirection. SLP Short Term Goal 4 (Week 3): Patient will attend to left enviornment/body with mod assist tactile and verbal cues. SLP Short Term Goal 5 (Week 3): Patient will maintain topic of conversation for ~3 turns with max assist verbal and question cues for redirection. SLP Short Term Goal 6 (Week 3): Patient will identify request help as neeed with mod assist verbal cues.   Skilled Therapeutic Interventions: Group session, co-treatment with OT; SLP focused on addressing dysphagia goals. Patient consumed Dys.2 textures and thin liquids with wife and clinicians providing mod assist cues to self-monitor left pocketing and anterior loss of food during meal and to stop eating to complete mastication and lingual sweeps to remove oral residue between bites. Patient exhibited intermittent throat clears and SLP cued for hard effortful coughs which appeared to be effective at removing suspected penetrates. Continue with current plan of care.    FIM:  Comprehension Comprehension Mode: Auditory Comprehension: 3-Understands basic 50 - 74% of the time/requires cueing 25 - 50%  of the time Expression Expression Mode: Verbal Expression: 5-Expresses basic 90% of the time/requires cueing < 10%  of the time. Social Interaction Social Interaction: 4-Interacts appropriately 75 - 89% of the time - Needs redirection for appropriate language or to initiate interaction. Problem Solving Problem Solving: 3-Solves basic 50 - 74% of the time/requires cueing 25 - 49% of the time Memory Memory: 3-Recognizes or recalls 50 - 74% of the time/requires cueing 25 - 49% of the time FIM - Eating Eating Activity: 4: Helper checks for pocketed food  Pain Pain Assessment Pain Assessment: No/denies pain  Therapy/Group: Group Therapy  Charlane Ferretti., CCC-SLP [phone]  Brendan Scott 01/18/2012, 1:30 PM

## 2012-01-18 NOTE — Progress Notes (Signed)
Patient ID: Brendan Scott, male   DOB: 08-03-1945, 66 y.o.   MRN: [id number] Subjective/Complaints:  Refused meds and L hand splint per nsg No seizures, agrees to take meds this am Review of Systems  Neurological: Positive for focal weakness.  All other systems reviewed and are negative.     Objective: Vital Signs: Blood pressure 139/81, pulse 56, temperature 97.9 F (36.6 C), temperature source Oral, resp. rate 16, height 5\' 6"  (1.676 m), weight 89 kg (196 lb 3.4 oz), SpO2 93.00%. No results found. Results for orders placed during the hospital encounter of 12/27/11 (from the past 72 hour(s))  GLUCOSE, CAPILLARY     Status: Abnormal   Collection Time   01/15/12 11:12 AM      Component Value Range Comment   Glucose-Capillary 190 (*) 70 - 99 mg/dL    Comment 1 Notify RN     GLUCOSE, CAPILLARY     Status: Abnormal   Collection Time   01/15/12  4:39 PM      Component Value Range Comment   Glucose-Capillary 140 (*) 70 - 99 mg/dL    Comment 1 Notify RN     GLUCOSE, CAPILLARY     Status: Abnormal   Collection Time   01/15/12  9:16 PM      Component Value Range Comment   Glucose-Capillary 110 (*) 70 - 99 mg/dL   GLUCOSE, CAPILLARY     Status: Abnormal   Collection Time   01/16/12  7:22 AM      Component Value Range Comment   Glucose-Capillary 131 (*) 70 - 99 mg/dL    Comment 1 Notify RN     GLUCOSE, CAPILLARY     Status: Abnormal   Collection Time   01/16/12 11:18 AM      Component Value Range Comment   Glucose-Capillary 196 (*) 70 - 99 mg/dL    Comment 1 Notify RN     GLUCOSE, CAPILLARY     Status: Abnormal   Collection Time   01/16/12  5:14 PM      Component Value Range Comment   Glucose-Capillary 112 (*) 70 - 99 mg/dL   GLUCOSE, CAPILLARY     Status: Abnormal   Collection Time   01/16/12  9:13 PM      Component Value Range Comment   Glucose-Capillary 133 (*) 70 - 99 mg/dL   PROTIME-INR     Status: Abnormal   Collection Time   01/17/12  6:18 AM      Component Value  Range Comment   Prothrombin Time 26.1 (*) 11.6 - 15.2 seconds    INR 2.54 (*) 0.00 - 1.49   GLUCOSE, CAPILLARY     Status: Abnormal   Collection Time   01/17/12  7:17 AM      Component Value Range Comment   Glucose-Capillary 129 (*) 70 - 99 mg/dL    Comment 1 Notify RN     GLUCOSE, CAPILLARY     Status: Abnormal   Collection Time   01/17/12 11:18 AM      Component Value Range Comment   Glucose-Capillary 175 (*) 70 - 99 mg/dL    Comment 1 Notify RN     GLUCOSE, CAPILLARY     Status: Normal   Collection Time   01/17/12  4:30 PM      Component Value Range Comment   Glucose-Capillary 70  70 - 99 mg/dL   GLUCOSE, CAPILLARY     Status: Abnormal   Collection Time  01/17/12  9:07 PM      Component Value Range Comment   Glucose-Capillary 125 (*) 70 - 99 mg/dL    Comment 1 Notify RN     GLUCOSE, CAPILLARY     Status: Abnormal   Collection Time   01/18/12  7:24 AM      Component Value Range Comment   Glucose-Capillary 164 (*) 70 - 99 mg/dL    Comment 1 Notify RN       HEENT: L lower facial droop  Cardio: RRR  Resp: Clear to auscultation GI: BS positive  Extremity: Pulses positive and No Edema  Skin: Intact  Neuro: Awake oriented to place, month,situation, Cranial Nerve Abnormalities L central VII, Normal Sensory, Abnormal Motor 2-/5 L hand , 3-/5 Bi.tri,delt,HF,KE,ankle DF/PF, Dysarthric, Inattention and Other Left neglect no field cut  Musc/Skel: Normal and Other no shoulder pain, L finger flexor contracture no ataxia  Still pocketing food  Labs reviewed Assessment/Plan: 1. Functional deficits secondary to R MCA infarct which require 3+ hours per day of interdisciplinary therapy in a comprehensive inpatient rehab setting. Physiatrist is providing close team supervision and 24 hour management of active medical problems listed below. Physiatrist and rehab team continue to assess barriers to discharge/monitor patient progress toward functional and medical goals. FIM: FIM -  Bathing Bathing Steps Patient Completed: Chest;Left Arm;Abdomen;Right upper leg;Left upper leg;Right lower leg (including foot);Left lower leg (including foot);Buttocks;Front perineal area Bathing: 4: Min-Patient completes 8-9 53f 10 parts or 75+ percent  FIM - Upper Body Dressing/Undressing Upper body dressing/undressing steps patient completed: Thread/unthread right sleeve of pullover shirt/dresss;Put head through opening of pull over shirt/dress;Pull shirt over trunk Upper body dressing/undressing: 4: Min-Patient completed 75 plus % of tasks FIM - Lower Body Dressing/Undressing Lower body dressing/undressing steps patient completed: Thread/unthread right underwear leg;Thread/unthread left underwear leg;Don/Doff right shoe;Don/Doff left shoe;Thread/unthread right pants leg;Don/Doff right sock;Don/Doff left sock Lower body dressing/undressing: 3: Mod-Patient completed 50-74% of tasks  FIM - Toileting Toileting steps completed by patient: Adjust clothing prior to toileting;Performs perineal hygiene;Adjust clothing after toileting Toileting Assistive Devices: Grab bar or rail for support Toileting: 4: Steadying assist  FIM - Diplomatic Services operational officer Devices: Grab bars Toilet Transfers: 4-To toilet/BSC: Min A (steadying Pt. > 75%);4-From toilet/BSC: Min A (steadying Pt. > 75%)  FIM - Bed/Chair Transfer Bed/Chair Transfer Assistive Devices: Therapist, occupational: 4: Bed > Chair or W/C: Min A (steadying Pt. > 75%);4: Chair or W/C > Bed: Min A (steadying Pt. > 75%)  FIM - Locomotion: Wheelchair Distance: 120 Locomotion: Wheelchair: 0: Activity did not occur FIM - Locomotion: Ambulation Locomotion: Ambulation Assistive Devices: Patent attorney - Hemi Ambulation/Gait Assistance: 4: Min assist Locomotion: Ambulation: 4: Travels 150 ft or more with minimal assistance (Pt.>75%)  Comprehension Comprehension Mode: Auditory Comprehension: 3-Understands basic 50 - 74%  of the time/requires cueing 25 - 50%  of the time  Expression Expression Mode: Verbal Expression: 5-Expresses basic 90% of the time/requires cueing < 10% of the time.  Social Interaction Social Interaction: 4-Interacts appropriately 75 - 89% of the time - Needs redirection for appropriate language or to initiate interaction.  Problem Solving Problem Solving: 3-Solves basic 50 - 74% of the time/requires cueing 25 - 49% of the time  Memory Memory: 3-Recognizes or recalls 50 - 74% of the time/requires cueing 25 - 49% of the time  . 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 DVT Prophylaxis/Anticoagulation: Chronic Coumadin therapy INR therapeutic on Pharm protocol. Monitor platelet counts any  signs of bleeding  3. Neuropsych: This patient is not capable of making decisions on his/her own behalf.  4. Epilepsia partialis continua now controlled. Continue Klonopin 0.5 mg each bedtime, vimpat 300mg  bid,keppra 2500mg  Q12hour, Dilantin 100 mg 3 times a day. Monitor closely for a seizure activity. Wife would like a neurologist closer to home.  Wife will try calling Dr Ninetta Lights 5. Hypertension/atrial fibrillation of new onset. Lisinopril 5 mg daily. Cardiac rate is controlled. There's been no reports of chest pain or shortness of breath   7. Dysphagia. Monitor for signs of aspiration 8. Non-insulin-dependent diabetes mellitus: Hemoglobin A1c 6.9.. Patient currently with sliding scale insulin. 9.  L finger flexor contracture will place wrist hand orthosis   LOS (Days) 22 A FACE TO FACE EVALUATION WAS PERFORMED  KIRSTEINS,ANDREW E 01/18/2012, 8:16 AM

## 2012-01-18 NOTE — Progress Notes (Signed)
Occupational Therapy Weekly Progress Note and Treatment Session Note  Patient Details  Name: Brendan Scott MRN: [id number] Date of Birth: [birth year]  Today's Date: 01/18/2012 Time: 0930-1030 and 1330-1400 Time Calculation (min): 60 min and 30 min  Patient has met 1 of 5 short term goals.  Pt is continuing to show progress towards goals, however continues to require mod-max cues for attention to task and sequencing with self-care tasks.  Pt continues to be motivated to improve LUE function in isolated tasks as well as functional tasks.  Patient continues to demonstrate the following deficits:LUE decreased grasp and ROM, impaired motor planning/sequencing, apraxia, impaired dynamic standing balance, increased falls risk and multiple cognitive impairments (awareness, sequencing, initiation, perseveration, cessation, motor planning, problem solving, perception) and therefore will continue to benefit from skilled OT intervention to enhance overall performance with BADL and Reduce care partner burden.  Patient progressing toward long term goals..  Continue plan of care.  OT Short Term Goals Week 3:  OT Short Term Goal 1 (Week 3): Pt will complete 2 of 4 grooming tasks in standing with min assist. OT Short Term Goal 1 - Progress (Week 3): Met OT Short Term Goal 2 (Week 3): Pt will complete toileting with min assist (3/3 steps with steadying assistance) OT Short Term Goal 2 - Progress (Week 3): Progressing toward goal OT Short Term Goal 3 (Week 3): Pt will demonstrate increased grasp with Lt hand by pulling up pants in standing. OT Short Term Goal 3 - Progress (Week 3): Progressing toward goal OT Short Term Goal 4 (Week 3): Pt will complete UB dressing with min assist 3/4 times. OT Short Term Goal 4 - Progress (Week 3): Partly met OT Short Term Goal 5 (Week 3): Pt will complete LB dressing with min assist OT Short Term Goal 5 - Progress (Week 3): Progressing toward goal Week 4:  OT Short Term Goal 1  (Week 4): Pt will complete toileting with min assist (3/3 steps with steadying assistance) OT Short Term Goal 2 (Week 4): Pt will demonstrate increased grasp with Lt hand by pulling up pants in standing OT Short Term Goal 3 (Week 4): Pt will complete UB dressing with min assist 3/4 times OT Short Term Goal 4 (Week 4): Pt will complete LB dressing with min assist  Skilled Therapeutic Interventions/Progress Updates:    1) Pt seen for ADL retraining at sink with focus on sit <> stand, standing tolerance, and sequencing of dressing with hemi-technique.  Pt asleep in w/c upon arrival secondary to refusal of PM meds and decreased arousal post AM meds.  Pt required sternal rub and cold wash cloth on face to arouse and maintain arousal.  Verbal and tactile/hand over hand assist with LB dressing due to requiring cues for sequencing and attention to Lt side to compensate for decreased grasp.  With failure at task, pt unable to recognize and unable to start over or fix task (ie dressing - missing threading of Lt leg unable to recognize or come back to don leg, attempting to stand to pull up pants with only one leg dressed) also continues with perceptual deficits with donning shirt.  2) 1:1 OT with focus on NM re-ed in sitting on edge of mat.  Use of Bioness e-stim to help facilitate finger flexion and extension to address decreased motor planning and control of UE movements. AWorked in open exercise mode to help with digit extension. Pt needs mod instructional cueing to maintain visual and mental attention on the left  hand to assist with facilitating digit extension. Progressed to having pt grasp and release a wash cloth into this therapists hand while e-stim stimulated digit extension.  Use of theraband with focus on Lt scapular retraction and grasp with shoulder extension to simulate LB dressing of pulling up pants.   Therapy Documentation Precautions:  Precautions Precautions: Fall Precaution Comments:  impulsive Restrictions Weight Bearing Restrictions: No General:   Vital Signs: Therapy Vitals BP: 147/93 mmHg Pain: Pain Assessment Pain Assessment: No/denies pain Pain Score: 0-No pain ADL: ADL Grooming: Setup Where Assessed-Grooming: Sitting at sink Upper Body Bathing: Setup Where Assessed-Upper Body Bathing: Sitting at sink Lower Body Bathing: Minimal assistance Where Assessed-Lower Body Bathing: Sitting at sink;Standing at sink Upper Body Dressing: Minimal assistance;Moderate cueing Where Assessed-Upper Body Dressing: Sitting at sink Lower Body Dressing: Moderate assistance Where Assessed-Lower Body Dressing: Sitting at sink;Standing at sink Toileting: Moderate assistance Where Assessed-Toileting: Teacher, adult education: Curator Method: Proofreader: Engineer, manufacturing systems Transfer: Insurance underwriter Method: Designer, industrial/product: Shower seat with back  See FIM for current functional status  Therapy/Group: Individual Therapy  Leonette Monarch 01/18/2012, 10:43 AM

## 2012-01-19 ENCOUNTER — Inpatient Hospital Stay (HOSPITAL_COMMUNITY): Payer: BC Managed Care – PPO | Admitting: Physical Therapy

## 2012-01-19 ENCOUNTER — Inpatient Hospital Stay (HOSPITAL_COMMUNITY): Payer: BC Managed Care – PPO | Admitting: Speech Pathology

## 2012-01-19 ENCOUNTER — Inpatient Hospital Stay (HOSPITAL_COMMUNITY): Payer: BC Managed Care – PPO | Admitting: Occupational Therapy

## 2012-01-19 LAB — GLUCOSE, CAPILLARY
Glucose-Capillary: 165 mg/dL — ABNORMAL HIGH (ref 70–99)
Glucose-Capillary: 166 mg/dL — ABNORMAL HIGH (ref 70–99)
Glucose-Capillary: 122 mg/dL — ABNORMAL HIGH (ref 70–99)
Glucose-Capillary: 102 mg/dL — ABNORMAL HIGH (ref 70–99)

## 2012-01-19 LAB — PROTIME-INR: Prothrombin Time: 31.2 seconds — ABNORMAL HIGH (ref 11.6–15.2)

## 2012-01-19 LAB — CBC
HCT: 46 % (ref 39.0–52.0)
Hemoglobin: 14.9 g/dL (ref 13.0–17.0)
MCH: 26.7 pg (ref 26.0–34.0)
MCHC: 32.4 g/dL (ref 30.0–36.0)
MCV: 82.4 fL (ref 78.0–100.0)

## 2012-01-19 NOTE — Progress Notes (Signed)
ANTICOAGULATION CONSULT NOTE - Follow Up Consult Pharmacy Consult:  Coumadin Indication: atrial fibrillation  No Known Allergies  Labs:  Basename 01/19/12 0600 01/17/12 0618  HGB 14.9 --  HCT 46.0 --  PLT 200 --  APTT -- --  LABPROT 31.2* 26.1*  INR 3.23* 2.54*  HEPARINUNFRC -- --  CREATININE -- --  CKTOTAL -- --  CKMB -- --  TROPONINI -- --   Assessment: 66 y/o male patient with a history of Afib who was on ASA 81 mg PTA and admitted with a stroke, not a candidate for TPA 2nd mass lesion on head CT.  Noted Dilantin therapy could increase the effect of Coumadin.  He continues on Keppra as well.  INR elevated today 3.23  Goal of Therapy:  INR 2-3   Plan:  -No Coumadin today -Change to daily INR  Thank you. Okey Regal, PharmD [phone]  01/19/2012 11:51 AM

## 2012-01-19 NOTE — Progress Notes (Signed)
Occupational Therapy Note  Patient Details  Name: JAKOBE BLAU MRN: [id number] Date of Birth: 1945/09/26 Today's Date: 01/19/2012  Time: 1200-1230 (cotx with Speech Therapy-group time 1130-1230) Pt denies pain Group Therapy Pt participated in self feeding group with focus on increased functional use of LUE, attention to left, task initiation and completion, swallowing strategies, and portion control.  Pt required increased verbal cues for swallowing strategies and task completion.  Therapist had to remove utensil from patients hand to prevent patient from taking additional portions of food before oral cavity was empty.   Lavone Neri [facility] 01/19/2012, 3:21 PM

## 2012-01-19 NOTE — Progress Notes (Signed)
Speech Language Pathology Daily Session Note  Patient Details  Name: Brendan Scott MRN: [id number] Date of Birth: 1946/01/16  Today's Date: 01/19/2012 Time: 1130-1200 Time Calculation (min): 30 min  Short Term Goals: Week 3: SLP Short Term Goal 1 (Week 3): Patient will self-monitor and correct left anterior loss of textures and liquids during p.o. intake with mod assist verbal and question cues. SLP Short Term Goal 2 (Week 3): Patient will utilize swallowing compensatory strategies while consuming Dys.2 textures and thin liquids with mod assist verbal cues. SLP Short Term Goal 3 (Week 3): Patient will demonstrate selective attention to a functional task for 10 minutes with mod assist verbal and question cues for redirection. SLP Short Term Goal 4 (Week 3): Patient will attend to left enviornment/body with mod assist tactile and verbal cues. SLP Short Term Goal 5 (Week 3): Patient will maintain topic of conversation for ~3 turns with max assist verbal and question cues for redirection. SLP Short Term Goal 6 (Week 3): Patient will identify request help as neeed with mod assist verbal cues.   Skilled Therapeutic Interventions: Group session, co-treatment with OT; SLP focused on addressing dysphagia goals. Patient consumed Dys.2 textures and thin liquids with friends present and SLP educated friends and patient to stop alternating attention and select attention to self-feeding and monitoring oral residue to assist with facilitating increase safety with p.o. intake.  Patient required max assist visual and verbal cues to manage secretions, self-monitor left pocketing and anterior loss of food during meal.  Clinician recommended to stop eating to complete mastication and lingual sweeps to remove oral residue between bites. Patient appeared more fatigued toady and consumed less p.o. overall;continue with current plan of care.    FIM:  Comprehension Comprehension Mode: Auditory Comprehension:  3-Understands basic 50 - 74% of the time/requires cueing 25 - 50%  of the time Expression Expression Mode: Verbal Expression: 5-Expresses basic 90% of the time/requires cueing < 10% of the time. Social Interaction Social Interaction: 4-Interacts appropriately 75 - 89% of the time - Needs redirection for appropriate language or to initiate interaction. Problem Solving Problem Solving: 3-Solves basic 50 - 74% of the time/requires cueing 25 - 49% of the time Memory Memory: 3-Recognizes or recalls 50 - 74% of the time/requires cueing 25 - 49% of the time FIM - Eating Eating Activity: 4: Helper checks for pocketed food  Pain Pain Assessment Pain Assessment: No/denies pain  Therapy/Group: Group Therapy  Charlane Ferretti., CCC-SLP [phone]  Brendan Scott 01/19/2012, 4:02 PM

## 2012-01-19 NOTE — Progress Notes (Signed)
Occupational Therapy Session Note  Patient Details  Name: Brendan Scott MRN: [id number] Date of Birth: [birth year]  Today's Date: 01/19/2012 Time: 1030-1130 Time Calculation (min): 60 min  Short Term Goals: Week 4:  OT Short Term Goal 1 (Week 4): Pt will complete toileting with min assist (3/3 steps with steadying assistance) OT Short Term Goal 2 (Week 4): Pt will demonstrate increased grasp with Lt hand by pulling up pants in standing OT Short Term Goal 3 (Week 4): Pt will complete UB dressing with min assist 3/4 times OT Short Term Goal 4 (Week 4): Pt will complete LB dressing with min assist  Skilled Therapeutic Interventions/Progress Updates:    Pt seen for ADL retraining at sink with focus on sit <> stand, standing tolerance, and sequencing of dressing with hemi-technique. Verbal and tactile/hand over hand assist with LB dressing due to requiring cues for sequencing and attention to Lt side to compensate for decreased grasp. Pt demonstrated increased independence with donning shirt this session, reporting need to don Lt arm first and able to carry out this task with initial setup assist.  Pt required increased time with donning shirt, and reports "short sleeves would be easier".  Backward chaining with socks with pt demonstrating crossing feet at ankles to increase safety and ability to complete donning socks.  Increased carryover with use of shoe button this session, requiring verbal cue to release shoelace once fastened.  Ambulated with pt with hand held assist and min-mod cues for safety with gait speed.  Therapy Documentation Precautions:  Precautions Precautions: Fall Precaution Comments: impulsive Restrictions Weight Bearing Restrictions: No Pain: Pain Assessment Pain Assessment: No/denies pain Pain Score: 0-No pain  See FIM for current functional status  Therapy/Group: Individual Therapy  Leonette Monarch 01/19/2012, 12:28 PM

## 2012-01-19 NOTE — Progress Notes (Signed)
Social Work Patient ID: Brendan Scott, male   DOB: 10-Oct-1945, 66 y.o.   MRN: [id number] Spoke with Cheryl-[employer] Case Manager who has approved him from 11/27-12/24 auth number [id number]. She is aware of his discharge date and will have covering person contact myself 12/26 to confirm he was discharged 12/24.

## 2012-01-19 NOTE — Progress Notes (Signed)
NUTRITION FOLLOW UP  Intervention:   Continue to encourage intake Ensure Pudding once daily Glucerna Shake bid  Nutrition Dx:   Inadequate oral intake r/t dysphagia AEB dysphagia 2 thin liquid diet-improving.  Goal:   Intake of >90% meals  Monitor:   Intake, labs, weight  Assessment:   Intake improved with 50-90% of meals.  Pt reports improving appetite.  Family reports that patient cannot always chew meat on Dysphagia 2 diet or is afraid to swallow.  Does continue to drink the Glucerna Shake.  Some weight loss since admit.   Height: Ht Readings from Last 1 Encounters:  01/09/12 5\' 6"  (1.676 m)    Weight Status:   Wt Readings from Last 1 Encounters:  01/17/12 196 lb 3.4 oz (89 kg)    Re-estimated needs:  Kcal: 1800-1900 Protein: 90-100 Fluid: 1.8-1.9L daily   Skin: intact  Diet Order: Dysphagia 2 thin, CHO MOD   Intake/Output Summary (Last 24 hours) at 01/19/12 1408 Last data filed at 01/19/12 0900  Gross per 24 hour  Intake    240 ml  Output      0 ml  Net    240 ml     Labs:  No results found for this basename: NA:3,K:3,CL:3,CO2:3,BUN:3,CREATININE:3,CALCIUM:3,MG:3,PHOS:3,GLUCOSE:3 in the last 168 hours CBG (last 3)   Basename 01/19/12 1126 01/19/12 0721 01/18/12 2130  GLUCAP 165* 132* 154*    Scheduled Meds:    . clonazePAM  0.5 mg Oral QHS  . feeding supplement  1 Container Oral Q24H  . feeding supplement  237 mL Oral BID BM  . hydrocerin   Topical BID  . insulin aspart  0-9 Units Subcutaneous TID WC  . lacosamide  300 mg Oral BID  . levETIRAcetam  2,500 mg Oral Q12H  . lisinopril  5 mg Oral Daily  . metFORMIN  500 mg Oral Q breakfast  . phenytoin  100 mg Oral TID  . senna-docusate  1 tablet Oral QHS  . simvastatin  20 mg Oral q1800  . Warfarin - Pharmacist Dosing Inpatient   Does not apply q1800   Continuous Infusions:      Oran Rein, RD, LDN Clinical Inpatient Dietitian Pager:  [phone] Weekend and after hours pager:   [phone]

## 2012-01-19 NOTE — Progress Notes (Signed)
Patient ID: Brendan Scott, male   DOB: 1945-12-01, 66 y.o.   MRN: [id number] Subjective/Complaints:  Cooperative with medications still poor compliance with hand split. Discussed this with patient Review of Systems  Neurological: Positive for focal weakness.  All other systems reviewed and are negative.     Objective: Vital Signs: Blood pressure 109/74, pulse 83, temperature 97.8 F (36.6 C), temperature source Oral, resp. rate 16, height 5\' 6"  (1.676 m), weight 89 kg (196 lb 3.4 oz), SpO2 100.00%. No results found. Results for orders placed during the hospital encounter of 12/27/11 (from the past 72 hour(s))  GLUCOSE, CAPILLARY     Status: Abnormal   Collection Time   01/16/12  5:14 PM      Component Value Range Comment   Glucose-Capillary 112 (*) 70 - 99 mg/dL   GLUCOSE, CAPILLARY     Status: Abnormal   Collection Time   01/16/12  9:13 PM      Component Value Range Comment   Glucose-Capillary 133 (*) 70 - 99 mg/dL   PROTIME-INR     Status: Abnormal   Collection Time   01/17/12  6:18 AM      Component Value Range Comment   Prothrombin Time 26.1 (*) 11.6 - 15.2 seconds    INR 2.54 (*) 0.00 - 1.49   GLUCOSE, CAPILLARY     Status: Abnormal   Collection Time   01/17/12  7:17 AM      Component Value Range Comment   Glucose-Capillary 129 (*) 70 - 99 mg/dL    Comment 1 Notify RN     GLUCOSE, CAPILLARY     Status: Abnormal   Collection Time   01/17/12 11:18 AM      Component Value Range Comment   Glucose-Capillary 175 (*) 70 - 99 mg/dL    Comment 1 Notify RN     GLUCOSE, CAPILLARY     Status: Normal   Collection Time   01/17/12  4:30 PM      Component Value Range Comment   Glucose-Capillary 70  70 - 99 mg/dL   GLUCOSE, CAPILLARY     Status: Abnormal   Collection Time   01/17/12  9:07 PM      Component Value Range Comment   Glucose-Capillary 125 (*) 70 - 99 mg/dL    Comment 1 Notify RN     GLUCOSE, CAPILLARY     Status: Abnormal   Collection Time   01/18/12  7:24 AM     Component Value Range Comment   Glucose-Capillary 164 (*) 70 - 99 mg/dL    Comment 1 Notify RN     GLUCOSE, CAPILLARY     Status: Abnormal   Collection Time   01/18/12 11:13 AM      Component Value Range Comment   Glucose-Capillary 165 (*) 70 - 99 mg/dL    Comment 1 Notify RN     GLUCOSE, CAPILLARY     Status: Normal   Collection Time   01/18/12  4:51 PM      Component Value Range Comment   Glucose-Capillary 96  70 - 99 mg/dL   GLUCOSE, CAPILLARY     Status: Abnormal   Collection Time   01/18/12  9:30 PM      Component Value Range Comment   Glucose-Capillary 154 (*) 70 - 99 mg/dL   PROTIME-INR     Status: Abnormal   Collection Time   01/19/12  6:00 AM      Component Value Range Comment  Prothrombin Time 31.2 (*) 11.6 - 15.2 seconds    INR 3.23 (*) 0.00 - 1.49   CBC     Status: Normal   Collection Time   01/19/12  6:00 AM      Component Value Range Comment   WBC 7.1  4.0 - 10.5 K/uL    RBC 5.58  4.22 - 5.81 MIL/uL    Hemoglobin 14.9  13.0 - 17.0 g/dL    HCT 16.1  09.6 - 04.5 %    MCV 82.4  78.0 - 100.0 fL    MCH 26.7  26.0 - 34.0 pg    MCHC 32.4  30.0 - 36.0 g/dL    RDW 40.9  81.1 - 91.4 %    Platelets 200  150 - 400 K/uL   GLUCOSE, CAPILLARY     Status: Abnormal   Collection Time   01/19/12  7:21 AM      Component Value Range Comment   Glucose-Capillary 132 (*) 70 - 99 mg/dL    Comment 1 Notify RN     GLUCOSE, CAPILLARY     Status: Abnormal   Collection Time   01/19/12 11:26 AM      Component Value Range Comment   Glucose-Capillary 165 (*) 70 - 99 mg/dL    Comment 1 Notify RN     GLUCOSE, CAPILLARY     Status: Abnormal   Collection Time   01/19/12  1:39 PM      Component Value Range Comment   Glucose-Capillary 166 (*) 70 - 99 mg/dL    Comment 1 Notify RN     GLUCOSE, CAPILLARY     Status: Abnormal   Collection Time   01/19/12  4:24 PM      Component Value Range Comment   Glucose-Capillary 122 (*) 70 - 99 mg/dL    Comment 1 Notify RN       HEENT: L  lower facial droop  Cardio: RRR  Resp: Clear to auscultation GI: BS positive  Extremity: Pulses positive and No Edema  Skin: Intact  Neuro: Awake oriented to place, month,situation, Cranial Nerve Abnormalities L central VII, Normal Sensory, Abnormal Motor 2-/5 L hand , 3-/5 Bi.tri,delt,HF,KE,ankle DF/PF, Dysarthric, Inattention and  Left neglect, no field cut  Musc/Skel: Normal and Other no shoulder pain, L finger flexor contracture no ataxia  Still pocketing food  Labs reviewed Assessment/Plan: 1. Functional deficits secondary to R MCA infarct which require 3+ hours per day of interdisciplinary therapy in a comprehensive inpatient rehab setting. Physiatrist is providing close team supervision and 24 hour management of active medical problems listed below. Physiatrist and rehab team continue to assess barriers to discharge/monitor patient progress toward functional and medical goals. FIM: FIM - Bathing Bathing Steps Patient Completed: Chest;Right Arm;Left Arm;Abdomen;Front perineal area;Buttocks;Right upper leg;Left upper leg;Right lower leg (including foot);Left lower leg (including foot) Bathing: 4: Steadying assist  FIM - Upper Body Dressing/Undressing Upper body dressing/undressing steps patient completed: Thread/unthread right sleeve of pullover shirt/dresss;Put head through opening of pull over shirt/dress;Pull shirt over trunk Upper body dressing/undressing: 4: Min-Patient completed 75 plus % of tasks FIM - Lower Body Dressing/Undressing Lower body dressing/undressing steps patient completed: Thread/unthread right underwear leg;Thread/unthread left underwear leg;Pull underwear up/down;Thread/unthread right pants leg;Thread/unthread left pants leg;Don/Doff right shoe;Don/Doff left shoe;Fasten/unfasten right shoe;Fasten/unfasten left shoe Lower body dressing/undressing: 4: Min-Patient completed 75 plus % of tasks  FIM - Toileting Toileting steps completed by patient: Adjust clothing  prior to toileting;Performs perineal hygiene;Adjust clothing after toileting Toileting Assistive Devices: Grab  bar or rail for support Toileting: 4: Steadying assist  FIM - Diplomatic Services operational officer Devices: Grab bars Toilet Transfers: 4-To toilet/BSC: Min A (steadying Pt. > 75%);4-From toilet/BSC: Min A (steadying Pt. > 75%)  FIM - Bed/Chair Transfer Bed/Chair Transfer Assistive Devices: Therapist, occupational: 2: Bed > Chair or W/C: Max A (lift and lower assist);2: Chair or W/C > Bed: Max A (lift and lower assist)  FIM - Locomotion: Wheelchair Distance: 150 Locomotion: Wheelchair: 0: Activity did not occur FIM - Locomotion: Ambulation Locomotion: Ambulation Assistive Devices: Chief Operating Officer Ambulation/Gait Assistance: 3: Mod assist;2: Max assist Locomotion: Ambulation: 2: Travels 150 ft or more with maximal assistance (Pt: 25 - 49%)  Comprehension Comprehension Mode: Auditory Comprehension: 3-Understands basic 50 - 74% of the time/requires cueing 25 - 50%  of the time  Expression Expression Mode: Verbal Expression: 5-Expresses basic 90% of the time/requires cueing < 10% of the time.  Social Interaction Social Interaction: 4-Interacts appropriately 75 - 89% of the time - Needs redirection for appropriate language or to initiate interaction.  Problem Solving Problem Solving: 3-Solves basic 50 - 74% of the time/requires cueing 25 - 49% of the time  Memory Memory: 3-Recognizes or recalls 50 - 74% of the time/requires cueing 25 - 49% of the time  . 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 DVT Prophylaxis/Anticoagulation: Chronic Coumadin therapy INR therapeutic on Pharm protocol. Monitor platelet counts any signs of bleeding  3. Neuropsych: This patient is not capable of making decisions on his/her own behalf.  4. Epilepsia partialis continua now controlled. Continue Klonopin 0.5 mg each bedtime, vimpat 300mg  bid,keppra 2500mg   Q12hour, Dilantin 100 mg 3 times a day. Monitor closely for a seizure activity. Wife would like a neurologist closer to home.  Wife will try calling Dr Ninetta Lights 5. Hypertension/atrial fibrillation of new onset. Lisinopril 5 mg daily. Cardiac rate is controlled. There's been no reports of chest pain or shortness of breath   7. Dysphagia. Monitor for signs of aspiration 8. Non-insulin-dependent diabetes mellitus: Hemoglobin A1c 6.9.. Patient currently with sliding scale insulin. 9.  L finger flexor contracture will place wrist hand orthosis   LOS (Days) 23 A FACE TO FACE EVALUATION WAS PERFORMED  KIRSTEINS,ANDREW E 01/19/2012, 5:13 PM

## 2012-01-19 NOTE — Progress Notes (Signed)
Physical Therapy Session Note  Patient Details  Name: Brendan Scott MRN: [id number] Date of Birth: 25-Sep-1945  Today's Date: 01/19/2012 Time: 1330-1430 Time Calculation (min): 60 min  Short Term Goals: Week 3:  PT Short Term Goal 1 (Week 3): = LTG  Skilled Therapeutic Interventions/Progress Updates:   Upon entering room patient noted to be asleep; therapist unable to arouse patient with leg tapping, sternal rub, calling patient's name; patient also noted to be clammy and diaphoretic.  RN, PA notified and vitals assessed.  Patient did awaken and all vitals and CBG WNL.  PA feels it is a side effect of seizure medication.  Assisted patient to stand from w/c with max A and stand at sink to brush teeth with mod A for balance and sequencing.  Performed gait in controlled environment x 150 with R hemi walker and mod-max A at time secondary to patient presenting with increased trunk flexion, L trunk/pelvis retraction, step to gait sequence and forward LOB.  Once in gym performed stretching to hip flexor and L knee reclined on wedge at edge of mat; also performed R and L lateral lean on elbow with other UE performing PNF patterns reaching in air and then to opposite shoe for increased trunk and hip ROM in transverse plane.  Continued gait training x 25' forwards and retro stepping x 2 with patient placing UE on therapist's shoulders with manual facilitation through UE and ribs for trunk rotation for forward and backward weight shifting, rotation and full step and stride length bilat.  Continued NMR with forced use of LUE and LLE and for sustained activation, lateral weight shifting with partial WB through UE on tall mat during L and R side stepping and then during reaching with RUE in various planes of movement for WB through LUE and then during 2 reps LLE single limb stance for increased weight shift and WB through LLE.  Performed gait in controlled environment back to room with LUE around therapist to  facilitate upright trunk posture and control during gait and with manual facilitation of lateral weight shifting and full step and stride length bilat.  Patient very fatigued and returned to bed with min A.    Therapy Documentation Precautions:  Precautions Precautions: Fall Precaution Comments: impulsive Restrictions Weight Bearing Restrictions: No Vital Signs: Therapy Vitals Temp: 97.8 F (36.6 C) Temp src: Oral Pulse Rate: 83  Resp: 16  BP: 109/74 mmHg Patient Position, if appropriate: Sitting Oxygen Therapy SpO2: 100 % Pain: Pain Assessment Pain Assessment: No/denies pain Locomotion : Ambulation Ambulation/Gait Assistance: 3: Mod assist;2: Max assist   See FIM for current functional status  Therapy/Group: Individual Therapy  Edman Circle [facility] 01/19/2012, 4:57 PM

## 2012-01-19 NOTE — Progress Notes (Signed)
Social Work Patient ID: Brendan Scott, male   DOB: [birth year], 66 y.o.   MRN: [id number] Met with pt and wife to discuss team conference progression toward goals and discharge 12/24.  Grandchildren to come in Sunday due to Audra-PT working that day. Wife has been here daily and has a good feel for pt's care.  Have arranged Home Health follow up via [facility] and awaiting DME.  Prepare for discharge Tuesday.

## 2012-01-19 NOTE — Progress Notes (Signed)
Speech Language Pathology Daily Session Note  Patient Details  Name: NEFI MUSICH MRN: [id number] Date of Birth: [birth year]  Today's Date: 01/19/2012 Time: 1500-1540 Time Calculation (min): 40 min  Short Term Goals: Week 3: SLP Short Term Goal 1 (Week 3): Patient will self-monitor and correct left anterior loss of textures and liquids during p.o. intake with mod assist verbal and question cues. SLP Short Term Goal 2 (Week 3): Patient will utilize swallowing compensatory strategies while consuming Dys.2 textures and thin liquids with mod assist verbal cues. SLP Short Term Goal 3 (Week 3): Patient will demonstrate selective attention to a functional task for 10 minutes with mod assist verbal and question cues for redirection. SLP Short Term Goal 4 (Week 3): Patient will attend to left enviornment/body with mod assist tactile and verbal cues. SLP Short Term Goal 5 (Week 3): Patient will maintain topic of conversation for ~3 turns with max assist verbal and question cues for redirection. SLP Short Term Goal 6 (Week 3): Patient will identify request help as neeed with mod assist verbal cues.   Skilled Therapeutic Interventions: Skilled treatment session focused on addressing cognition goals during various tasks.  SLP introduced a new card game to patient with max assist cues faded to min assist verbal cues, after 5 minute intervals patient began to perseverate on turning cards over and lost the task/rules of the game.  SLP provided patient with direct verbal instructions to increase self-monitoring and that his goals were to identify and stop those moments of perseveration during daily tasks.  Patient verbalized understanding but continued to require max-total assist to break moments of perseveration after SLP identified them.  Patient was unable to verbally recall location of room but was functionally able to direct SLP to location accurately.    FIM:  Comprehension Comprehension Mode:  Auditory Comprehension: 3-Understands basic 50 - 74% of the time/requires cueing 25 - 50%  of the time Expression Expression Mode: Verbal Expression: 5-Expresses basic 90% of the time/requires cueing < 10% of the time. Social Interaction Social Interaction: 4-Interacts appropriately 75 - 89% of the time - Needs redirection for appropriate language or to initiate interaction. Problem Solving Problem Solving: 3-Solves basic 50 - 74% of the time/requires cueing 25 - 49% of the time Memory Memory: 3-Recognizes or recalls 50 - 74% of the time/requires cueing 25 - 49% of the time FIM - Eating Eating Activity: 4: Helper checks for pocketed food  Pain Pain Assessment Pain Assessment: No/denies pain  Therapy/Group: Individual Therapy  Charlane Ferretti., CCC-SLP [phone]  Maleigh Bagot 01/19/2012, 4:44 PM

## 2012-01-19 NOTE — Progress Notes (Signed)
Social Work Patient ID: Brendan Scott, male   DOB: 08/22/1945, 66 y.o.   MRN: [id number] Pt requires a lightweight wheelchair to be able to self propel and uses for his self care tasks.  He is not able to self propel a standard weight wheelchair. Wife and his mother here today going through therapies.  Wife very pleased with his progress.

## 2012-01-19 NOTE — Progress Notes (Signed)
Nursing Note: Bed alrm sounded.Pt trying to get up.DId not need to use bathroom,but confused and paranoid. Pt states we are trying to force something on him and refused to take meds or get back in bed. After 15 minutes of calm coaxing and leaving a message for his wife ,pt finally allowed [place] to put his legs in bed and we re-set his bedalrm. After wife called back and talked w/ pt,he agreed to take his meds while his wife was still on the phone waiting for him to take them. Pt did take all his meds but asked ,if he was being given something that would hurt him.Pt re-assured that we are here to take care of him .Per report,same thing happened night before but pt would not take his meds.wbb

## 2012-01-20 ENCOUNTER — Inpatient Hospital Stay (HOSPITAL_COMMUNITY): Payer: BC Managed Care – PPO | Admitting: Physical Therapy

## 2012-01-20 ENCOUNTER — Inpatient Hospital Stay (HOSPITAL_COMMUNITY): Payer: BC Managed Care – PPO | Admitting: Speech Pathology

## 2012-01-20 LAB — GLUCOSE, CAPILLARY
Glucose-Capillary: 147 mg/dL — ABNORMAL HIGH (ref 70–99)
Glucose-Capillary: 175 mg/dL — ABNORMAL HIGH (ref 70–99)

## 2012-01-20 MED ORDER — WARFARIN SODIUM 2.5 MG PO TABS
2.5000 mg | ORAL_TABLET | Freq: Once | ORAL | Status: AC
  Administered 2012-01-20: 2.5 mg via ORAL
  Filled 2012-01-20: qty 1

## 2012-01-20 NOTE — Progress Notes (Signed)
ANTICOAGULATION CONSULT NOTE - Follow Up Consult  Pharmacy Consult for Coumadin Indication: atrial fibrillation  No Known Allergies  Patient Measurements: Height: 5\' 6"  (167.6 cm) Weight: 196 lb 3.4 oz (89 kg) IBW/kg (Calculated) : 63.8  Heparin Dosing Weight:   Vital Signs: Temp: 98.5 F (36.9 C) (12/21 0700) Temp src: Oral (12/21 0700) BP: 106/73 mmHg (12/21 0700) Pulse Rate: 80  (12/21 0700)  Labs:  Basename 01/20/12 0441 01/19/12 0600  HGB -- 14.9  HCT -- 46.0  PLT -- 200  APTT -- --  LABPROT 28.5* 31.2*  INR 2.86* 3.23*  HEPARINUNFRC -- --  CREATININE -- --  CKTOTAL -- --  CKMB -- --  TROPONINI -- --    Estimated Creatinine Clearance: 69.7 ml/min (by C-G formula based on Cr of 1.09).   Medications:  Scheduled:    . clonazePAM  0.5 mg Oral QHS  . feeding supplement  1 Container Oral Q24H  . feeding supplement  237 mL Oral BID BM  . hydrocerin   Topical BID  . insulin aspart  0-9 Units Subcutaneous TID WC  . lacosamide  300 mg Oral BID  . levETIRAcetam  2,500 mg Oral Q12H  . lisinopril  5 mg Oral Daily  . metFORMIN  500 mg Oral Q breakfast  . phenytoin  100 mg Oral TID  . senna-docusate  1 tablet Oral QHS  . simvastatin  20 mg Oral q1800  . Warfarin - Pharmacist Dosing Inpatient   Does not apply q1800    Assessment: 66yo male on Coumadin for AFib.  INR down to 2.86 today after dose held last PM.  No bleeding problems noted.    Goal of Therapy:  INR 2-3 Monitor platelets by anticoagulation protocol: Yes   Plan:  1.  Coumadin 2.5mg  today 2.  Continue daily INR  Marisue Humble, PharmD Clinical Pharmacist [facility]

## 2012-01-20 NOTE — Progress Notes (Signed)
Patient ID: Brendan Scott, male   DOB: [birth year], 66 y.o.   MRN: [id number] Subjective/Complaints:  Cooperative with medications still poor compliance with hand split. Discussed this with patient Review of Systems  Neurological: Positive for focal weakness.     Objective: Vital Signs: Blood pressure 106/73, pulse 80, temperature 98.5 F (36.9 C), temperature source Oral, resp. rate 18, height 5\' 6"  (1.676 m), weight 196 lb 3.4 oz (89 kg), SpO2 99.00%. elderly male in no acute distress. HEENT exam atraumatic, normocephalic, neck supple without jugular venous distention. Chest clear to auscultation . Abdominal exam overweight with bowel sounds, soft and nontender.   Assessment/Plan: 1. Functional deficits secondary to R MCA infarct  . 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 DVT Prophylaxis/Anticoagulation: Chronic Coumadin therapy INR therapeutic on Pharm protocol. Monitor platelet counts any signs of bleeding  3. Neuropsych: This patient is not capable of making decisions on his/her own behalf.  4. Epilepsia partialis continua now controlled. Continue Klonopin 0.5 mg each bedtime, vimpat 300mg  bid,keppra 2500mg  Q12hour, Dilantin 100 mg 3 times a day.  5. Hypertension/atrial fibrillation of new onset. Lisinopril 5 mg daily. Cardiac rate is controlled. There's been no reports of chest pain or shortness of breath   7. Dysphagia. Monitor for signs of aspiration 8. Non-insulin-dependent diabetes mellitus: Hemoglobin A1c 6.9.. Patient currently with sliding scale insulin. 9.  L finger flexor contracture will place wrist hand orthosis   LOS (Days) 24 A FACE TO FACE EVALUATION WAS PERFORMED  Brendan Scott 01/20/2012, 9:18 AM

## 2012-01-20 NOTE — Progress Notes (Signed)
Speech Language Pathology Daily Session Note  Patient Details  Name: Brendan Scott MRN: [id number] Date of Birth: [birth year]  Today's Date: 01/20/2012 Time: 1130-1220 Time Calculation (min): 50 min  Short Term Goals: Week 3: SLP Short Term Goal 1 (Week 3): Patient will self-monitor and correct left anterior loss of textures and liquids during p.o. intake with mod assist verbal and question cues. SLP Short Term Goal 2 (Week 3): Patient will utilize swallowing compensatory strategies while consuming Dys.2 textures and thin liquids with mod assist verbal cues. SLP Short Term Goal 3 (Week 3): Patient will demonstrate selective attention to a functional task for 10 minutes with mod assist verbal and question cues for redirection. SLP Short Term Goal 4 (Week 3): Patient will attend to left enviornment/body with mod assist tactile and verbal cues. SLP Short Term Goal 5 (Week 3): Patient will maintain topic of conversation for ~3 turns with max assist verbal and question cues for redirection. SLP Short Term Goal 6 (Week 3): Patient will identify request help as neeed with mod assist verbal cues.   Skilled Therapeutic Interventions: Group session; SLP focused on addressing dysphagia goals. Patient consumed Dys.2 textures and thin liquids with wife present.  SLP and wife facilitated session with mod assist verbal cues to stop self-feeding after 1-2 bites to monitor Brendan residue and perform lingual sweep to compensate for pocketing and utilize napkin for anterior loss of food during meal.  Patient exhibited intermittent hard reflexive coughs which appeared to remove suspected penetrates.  Wife cued to remove Brendan residue at end of meal and SLP educated that Brendan care after meals would be recommended.  Continue with current plan of care.    FIM:  Comprehension Comprehension Mode: Auditory Comprehension: 3-Understands basic 50 - 74% of the time/requires cueing 25 - 50%  of the time Expression Expression  Mode: Verbal Expression: 5-Expresses basic 90% of the time/requires cueing < 10% of the time. Social Interaction Social Interaction: 4-Interacts appropriately 75 - 89% of the time - Needs redirection for appropriate language or to initiate interaction. Problem Solving Problem Solving: 3-Solves basic 50 - 74% of the time/requires cueing 25 - 49% of the time Memory Memory: 3-Recognizes or recalls 50 - 74% of the time/requires cueing 25 - 49% of the time FIM - Eating Eating Activity: 4: Helper checks for pocketed food  Pain Pain Assessment Pain Assessment: No/denies pain  Therapy/Group: Group Therapy  Brendan Scott., CCC-SLP [phone]  Brendan Scott 01/20/2012, 3:02 PM

## 2012-01-20 NOTE — Progress Notes (Signed)
Physical Therapy Note  Patient Details  Name: Brendan Scott MRN: [id number] Date of Birth: Jan 15, 1946 Today's Date: 01/20/2012  1300-1355 (55 minutes) group Pain: no reported pain Pt participated in PT group session focused on gait training/safety/endurance. Pt ambulates 80 feet X 3 with HW min assist with max vcs for safety approaching wc for safe stand to sit ; up/down 2 steps with bilateral rails min assist with max vcs for sequencing.   Albina Gosney,JIM 01/20/2012, 7:32 AM

## 2012-01-21 ENCOUNTER — Inpatient Hospital Stay (HOSPITAL_COMMUNITY): Payer: BC Managed Care – PPO | Admitting: Physical Therapy

## 2012-01-21 LAB — PROTIME-INR: Prothrombin Time: 23 seconds — ABNORMAL HIGH (ref 11.6–15.2)

## 2012-01-21 LAB — GLUCOSE, CAPILLARY: Glucose-Capillary: 85 mg/dL (ref 70–99)

## 2012-01-21 MED ORDER — WARFARIN SODIUM 5 MG PO TABS
5.0000 mg | ORAL_TABLET | Freq: Once | ORAL | Status: AC
  Administered 2012-01-21: 5 mg via ORAL
  Filled 2012-01-21: qty 1

## 2012-01-21 NOTE — Progress Notes (Signed)
Physical Therapy Session Note  Patient Details  Name: Brendan Scott MRN: [id number] Date of Birth: 11-18-1945  Today's Date: 01/21/2012 Time: 7829-5621 Time Calculation (min): 57 min  Short Term Goals: Week 2:  PT Short Term Goal 1 (Week 2): Will perform all transfers and gait with min A and 25% cues for initiation, cessaition, sequencing PT Short Term Goal 1 - Progress (Week 2): Met Week 3:  PT Short Term Goal 1 (Week 3): = LTG  Skilled PT intervention with focus on family education with wife; see below for details for transfers, gait, equipment and stair negotiation.  Grandchildren to be present tomorrow for further education.  Therapy Documentation Precautions:  Precautions Precautions: Fall Precaution Comments: impulsive Restrictions Weight Bearing Restrictions: No Vital Signs: Therapy Vitals Temp: 97.6 F (36.4 C) Temp src: Oral Pulse Rate: 85  Resp: 18  BP: 103/66 mmHg Patient Position, if appropriate: Lying Oxygen Therapy SpO2: 100 % O2 Device: None (Room air) Pain: Pain Assessment Pain Assessment: No/denies pain Mobility: Transfers Stand Pivot Transfers: 5: Supervision Stand Pivot Transfer Details (indicate cue type and reason): Decreased verbal cues needed for initiation or safe hand placement; requires extra time secondary to L knee decreased ROM and cues to translate COG over BOS; needs intermittent cues for safety and sequencing with hemiwalker when pivoting and to keep walker next to him; patient and wife also demonstrated safe w/c <> car transfer with stand pivot with hemi walker with patient placing L foot inside car first and then sitting with UE support on back of seat and door.  Required min A for balance to bring LLE out of car secondary to decreased knee ROM.  While patient in car demonstrated to wife how to collapse w/c for storage and for set up for transfers and parts management.    Locomotion : Ambulation Ambulation/Gait Assistance: 4: Min  guard Ambulation Distance (Feet): 150 Feet Assistive device: Hemi-walker Ambulation/Gait Assistance Details: Requires supervision-min A for gait in household and controlled environments with intermittent verbal and tactile cues to maintain upright posture and safe distance to hemi walker; also requires cues for full step and stride length and for attention to L environment for safety.  As patient fatigues he requires increased assistance to maintain balance secondary to L lateral lean and forward LOB; wife has been trained how to cue patient and when to recognize need for rest breaks.  Patient has w/c for longer community distances Gait Gait Pattern: Step-to pattern;Decreased step length - left;Decreased stance time - left;Decreased stride length;Decreased weight shift to left;Lateral trunk lean to right;Decreased trunk rotation;Trunk rotated posteriorly on left;Trunk flexed;Wide base of support Stairs / Additional Locomotion Stairs: Yes Stairs Assistance: 4: Min assist Stairs Assistance Details (indicate cue type and reason): Reviewed stair negotiation sequence with wife and demonstrated to wife how patient ascends with alternating sequence but descends with step to pattern and to descend leading with LLE secondary to decreased ROM.  Wife and patient gave repeat demonstration with wife providing verbal and tactile cues for safety, sequencing and to maintain LUE grip on rail.   Stair Management Technique: Two rails;Alternating pattern;Step to pattern;Forwards Number of Stairs: 8  Height of Stairs: 5  Wheelchair Mobility Wheelchair Mobility: No   See FIM for current functional status  Therapy/Group: Individual Therapy  Edman Circle [facility] 01/21/2012, 4:16 PM

## 2012-01-21 NOTE — Progress Notes (Signed)
ANTICOAGULATION CONSULT NOTE - Follow Up Consult  Pharmacy Consult for Coumadin Indication: atrial fibrillation  No Known Allergies  Patient Measurements: Height: 5\' 6"  (167.6 cm) Weight: 196 lb 3.4 oz (89 kg) IBW/kg (Calculated) : 63.8  Heparin Dosing Weight:   Vital Signs: Temp: 98 F (36.7 C) (12/22 0600) Temp src: Oral (12/22 0600) BP: 111/75 mmHg (12/22 0600) Pulse Rate: 72  (12/22 0600)  Labs:  Basename 01/21/12 0842 01/20/12 0441 01/19/12 0600  HGB -- -- 14.9  HCT -- -- 46.0  PLT -- -- 200  APTT -- -- --  LABPROT 23.0* 28.5* 31.2*  INR 2.14* 2.86* 3.23*  HEPARINUNFRC -- -- --  CREATININE -- -- --  CKTOTAL -- -- --  CKMB -- -- --  TROPONINI -- -- --    Estimated Creatinine Clearance: 69.7 ml/min (by C-G formula based on Cr of 1.09).   Medications:  Scheduled:    . clonazePAM  0.5 mg Oral QHS  . feeding supplement  1 Container Oral Q24H  . feeding supplement  237 mL Oral BID BM  . hydrocerin   Topical BID  . insulin aspart  0-9 Units Subcutaneous TID WC  . lacosamide  300 mg Oral BID  . levETIRAcetam  2,500 mg Oral Q12H  . lisinopril  5 mg Oral Daily  . metFORMIN  500 mg Oral Q breakfast  . phenytoin  100 mg Oral TID  . senna-docusate  1 tablet Oral QHS  . simvastatin  20 mg Oral q1800  . [COMPLETED] warfarin  2.5 mg Oral ONCE-1800  . Warfarin - Pharmacist Dosing Inpatient   Does not apply q1800    Assessment: 66yo male with AFib.  INR down to 2.14 this AM, large drop over last 2 days s/p held dose on Friday.  No bleeding problems noted.  Goal of Therapy:  INR 2-3 Monitor platelets by anticoagulation protocol: Yes   Plan:  1.  Coumadin 5mg  today 2.  Daily INR  Marisue Humble, PharmD Clinical Pharmacist [facility]

## 2012-01-21 NOTE — Progress Notes (Signed)
Physical Therapy Discharge Summary  Patient Details  Name: Brendan Scott MRN: [id number] Date of Birth: 1945-11-26  Today's Date: 01/22/2012 Time: 6295-2841, 56 min  Today's session focused on neuromuscular re-education with standing dynamic balance task with emphasis on scanning and attention to the L side, using L hand to pick up small objects and reach to various positions in front and to the side. Session also focused on gait training, stair negotiation, transfers, and bed mobility. Patient ambulated 150' with hemiwalker and min assist, negotiated 8 stairs with bilateral handrails and min assist, performs bed mobility and transfers with hemiwalker with supervision.  Patient has met 10 of 10 long term goals due to improved activity tolerance, improved balance, improved postural control, increased strength, increased range of motion, ability to compensate for deficits, functional use of  left upper extremity and left lower extremity, improved attention, improved awareness and improved coordination.  Patient to discharge at an ambulatory level Min Assist with hemi walker for household distances with w/c use for longer distances.   Patient's care partner is independent to provide the necessary physical, cognitive and supervision assistance at discharge.  Reasons goals not met: All goals met  Recommendation:  Patient will benefit from ongoing skilled PT services in home health setting to continue to advance safe functional mobility, address ongoing impairments in L sided impaired strength, motor control, timing, sequencing, apraxia, cognition, postural control, dynamic standing balance, gait, and minimize fall risk.  Equipment: hemi walker and w/c for longer community distances  Reasons for discharge: treatment goals met and discharge from hospital  Patient/family agrees with progress made and goals achieved: Yes  PT Discharge Precautions/Restrictions Precautions Precautions:  Fall Precaution Comments: impulsive Pain Pain Assessment Pain Assessment: No/denies pain Cognition Overall Cognitive Status: Impaired Arousal/Alertness: Lethargic Orientation Level: Oriented X4 Sensation Sensation Stereognosis: Not tested Hot/Cold: Not tested Proprioception: Appears Intact Coordination Gross Motor Movements are Fluid and Coordinated: No Fine Motor Movements are Fluid and Coordinated: No Coordination and Movement Description: Significant L LE hypertonia Heel Shin Test: Severe dysmetria on L LE Motor  Motor Motor: Hemiplegia;Abnormal tone;Motor apraxia;Motor perseverations;Abnormal postural alignment and control Motor - Discharge Observations: L hemiplegia and increased tone persist; also presents with continued L knee decreased ROM, motor apraxia and perseverations less severe but still persist; stands with LLE ER, trunk and pelvis retracted with trunk lateral lean and flexion  Mobility Bed Mobility Bed Mobility: Supine to Sit;Sit to Supine Supine to Sit: 5: Supervision Sit to Supine: 5: Supervision Sit to Supine - Details (indicate cue type and reason): Supine <> sit flat bed, no rail with supervision and verbal cues to initiate and for safety/sequencing Transfers Stand Pivot Transfers: 5: Supervision Stand Pivot Transfer Details (indicate cue type and reason): Decreased verbal cues needed for initiation or safe hand placement; requires extra time secondary to L knee decreased ROM and cues to translate COG over BOS; needs intermittent cues for safety and sequencing with hemiwalker when pivoting and to keep walker next to him; patient and wife also demonstrated safe w/c <> car transfer with stand pivot with hemi walker with patient placing L foot inside car first and then sitting with UE support on back of seat and door.  Required min A for balance to bring LLE out of car secondary to decreased knee ROM.  While patient in car demonstrated to wife how to collapse w/c for  storage and for set up for transfers and parts management.    Locomotion  Ambulation Ambulation/Gait Assistance: 4: Min guard  Ambulation Distance (Feet): 150 Feet Assistive device: Hemi-walker Ambulation/Gait Assistance Details: Requires supervision-min A for gait in household and controlled environments with intermittent verbal and tactile cues to maintain upright posture and safe distance to hemi walker; also requires cues for full step and stride length and for attention to L environment for safety.  As patient fatigues he requires increased assistance to maintain balance secondary to L lateral lean and forward LOB; wife has been trained how to cue patient and when to recognize need for rest breaks.  Patient has w/c for longer community distances Gait Gait Pattern: Step-to pattern;Decreased step length - left;Decreased stance time - left;Decreased stride length;Decreased weight shift to left;Lateral trunk lean to right;Decreased trunk rotation;Trunk rotated posteriorly on left;Trunk flexed;Wide base of support Stairs / Additional Locomotion Stairs: Yes Stairs Assistance: 4: Min assist Stairs Assistance Details (indicate cue type and reason): Reviewed stair negotiation sequence with wife and demonstrated to wife how patient ascends with alternating sequence but descends with step to pattern and to descend leading with LLE secondary to decreased ROM.  Wife and patient gave repeat demonstration with wife providing verbal and tactile cues for safety, sequencing and to maintain LUE grip on rail.   Stair Management Technique: Two rails;Alternating pattern;Step to pattern;Forwards Number of Stairs: 8  Height of Stairs: 5  Wheelchair Mobility Wheelchair Mobility: No  Trunk/Postural Assessment  Postural Control Trunk Control: In standing still presents with L lateral and forward trunk flexion with L trunk/pelvis posteriorly rotated  Balance Static Sitting Balance Static Sitting - Balance Support:  Feet supported Static Sitting - Level of Assistance: 6: Modified independent (Device/Increase time) Dynamic Sitting Balance Dynamic Sitting - Balance Support: Feet supported Dynamic Sitting - Level of Assistance: 5: Stand by assistance Static Standing Balance Static Standing - Balance Support: Right upper extremity supported Static Standing - Level of Assistance: 5: Stand by assistance Dynamic Standing Balance Dynamic Standing - Balance Support: Right upper extremity supported Dynamic Standing - Level of Assistance: 5: Stand by assistance Extremity Assessment  RLE Assessment RLE Assessment: Within Functional Limits RLE Strength RLE Overall Strength: Within Functional Limits for tasks assessed LLE Assessment LLE Assessment: Exceptions to [facility] LLE Strength LLE Overall Strength: Deficits;Due to premorbid status LLE Overall Strength Comments: Grossly 4+/5  See FIM for current functional status  Zella Richer Lynora Dymond 01/22/2012, 2:41 PM

## 2012-01-21 NOTE — Progress Notes (Signed)
Patient ID: Brendan Scott, male   DOB: 1945-09-14, 66 y.o.   MRN: [id number] Subjective/Complaints:  No complaints Eager to go home tuesday Review of Systems  Neurological: Positive for focal weakness.     Objective: Vital Signs: Blood pressure 111/75, pulse 72, temperature 98 F (36.7 C), temperature source Oral, resp. rate 18, height 5\' 6"  (1.676 m), weight 196 lb 3.4 oz (89 kg), SpO2 100.00%. elderly male in no acute distress. HEENT exam atraumatic, normocephalic, neck supple without jugular venous distention. Chest clear to auscultation . Abdominal exam overweight with bowel sounds, soft and nontender.   Assessment/Plan: 1. Functional deficits secondary to R MCA infarct  . 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 DVT Prophylaxis/Anticoagulation: Chronic Coumadin therapy INR therapeutic on Pharm protocol. Monitor platelet counts any signs of bleeding  3. Neuropsych: This patient is not capable of making decisions on his/her own behalf.  4. Epilepsia partialis continua now controlled. Continue Klonopin 0.5 mg each bedtime, vimpat 300mg  bid,keppra 2500mg  Q12hour, Dilantin 100 mg 3 times a day.  5. Hypertension/atrial fibrillation of new onset. Lisinopril 5 mg daily. Cardiac rate is controlled. There's been no reports of chest pain or shortness of breath   7. Dysphagia. Monitor for signs of aspiration 8. Non-insulin-dependent diabetes mellitus: Hemoglobin A1c 6.9. 9.  L finger flexor contracture will place wrist hand orthosis   LOS (Days) 25 A FACE TO FACE EVALUATION WAS PERFORMED  SWORDS,BRUCE HENRY 01/21/2012, 9:13 AM

## 2012-01-22 ENCOUNTER — Inpatient Hospital Stay (HOSPITAL_COMMUNITY): Payer: BC Managed Care – PPO | Admitting: Occupational Therapy

## 2012-01-22 ENCOUNTER — Inpatient Hospital Stay (HOSPITAL_COMMUNITY): Payer: BC Managed Care – PPO | Admitting: *Deleted

## 2012-01-22 ENCOUNTER — Inpatient Hospital Stay (HOSPITAL_COMMUNITY): Payer: BC Managed Care – PPO | Admitting: Speech Pathology

## 2012-01-22 DIAGNOSIS — G8110 Spastic hemiplegia affecting unspecified side: Secondary | ICD-10-CM

## 2012-01-22 DIAGNOSIS — I6340 Cerebral infarction due to embolism of unspecified cerebral artery: Secondary | ICD-10-CM

## 2012-01-22 LAB — GLUCOSE, CAPILLARY
Glucose-Capillary: 137 mg/dL — ABNORMAL HIGH (ref 70–99)
Glucose-Capillary: 107 mg/dL — ABNORMAL HIGH (ref 70–99)
Glucose-Capillary: 146 mg/dL — ABNORMAL HIGH (ref 70–99)

## 2012-01-22 LAB — PROTIME-INR
INR: 2.11 — ABNORMAL HIGH (ref 0.00–1.49)
Prothrombin Time: 22.8 seconds — ABNORMAL HIGH (ref 11.6–15.2)

## 2012-01-22 MED ORDER — WARFARIN SODIUM 5 MG PO TABS
5.0000 mg | ORAL_TABLET | Freq: Once | ORAL | Status: AC
  Administered 2012-01-22: 5 mg via ORAL
  Filled 2012-01-22: qty 1

## 2012-01-22 NOTE — Progress Notes (Signed)
Speech Language Pathology Daily Session Note  Patient Details  Name: ARIZ TERRONES MRN: [id number] Date of Birth: October 30, 1945  Today's Date: 01/22/2012 Time: 1130-1200 Time Calculation (min): 30 min  Short Term Goals: Week 3: SLP Short Term Goal 1 (Week 3): Patient will self-monitor and correct left anterior loss of textures and liquids during p.o. intake with mod assist verbal and question cues. SLP Short Term Goal 2 (Week 3): Patient will utilize swallowing compensatory strategies while consuming Dys.2 textures and thin liquids with mod assist verbal cues. SLP Short Term Goal 3 (Week 3): Patient will demonstrate selective attention to a functional task for 10 minutes with mod assist verbal and question cues for redirection. SLP Short Term Goal 4 (Week 3): Patient will attend to left enviornment/body with mod assist tactile and verbal cues. SLP Short Term Goal 5 (Week 3): Patient will maintain topic of conversation for ~3 turns with max assist verbal and question cues for redirection. SLP Short Term Goal 6 (Week 3): Patient will identify request help as neeed with mod assist verbal cues.   Skilled Therapeutic Interventions: Group session, co-treatment with OT; SLP focused on addressing dysphagia goals. Patient consumed Dys.2 textures and thin liquids with wife present. SLP and wife facilitated session with mod assist verbal cues to stop self-feeding after 1-2 bites to monitor oral residue and perform lingual sweep to compensate for pocketing and utilize napkin for anterior loss of food during meal.  SLP educated wife and patient on use of manual finger sweep as needed and provided tactile cue to perform x1, then patient able to return demonstration.  Wife reported that was easier for management for now.  Patient exhibited intermittent throat clears which appeared to remove suspected penetrates.  Patient and wife ready for discharge.     FIM:  Comprehension Comprehension Mode:  Auditory Comprehension: 3-Understands basic 50 - 74% of the time/requires cueing 25 - 50%  of the time Expression Expression Mode: Verbal Expression: 4-Expresses basic 75 - 89% of the time/requires cueing 10 - 24% of the time. Needs helper to occlude trach/needs to repeat words. Social Interaction Social Interaction: 4-Interacts appropriately 75 - 89% of the time - Needs redirection for appropriate language or to initiate interaction. Problem Solving Problem Solving: 3-Solves basic 50 - 74% of the time/requires cueing 25 - 49% of the time Memory Memory: 4-Recognizes or recalls 75 - 89% of the time/requires cueing 10 - 24% of the time FIM - Eating Eating Activity: 4: Helper checks for pocketed food  Pain Pain Assessment Pain Assessment: No/denies pain  Therapy/Group: Group Therapy  Charlane Ferretti., CCC-SLP [phone]  Ashaunti Treptow 01/22/2012, 4:10 PM

## 2012-01-22 NOTE — Progress Notes (Signed)
Occupational Therapy Discharge Summary  Patient Details  Name: Brendan Scott MRN: [id number] Date of Birth: [birth year]  Today's Date: 01/22/2012  Patient has met 10 of 10 long term goals due to improved activity tolerance, improved balance, postural control, ability to compensate for deficits, functional use of  LEFT upper and LEFT lower extremity and improved awareness.  Patient to discharge at [facility] Assist level.  Patient's care partner is independent to provide the necessary physical and cognitive assistance at discharge.  Patient requires cues for initiation, sequencing, and motor planning with ADL skills of bathing, dressing, and transfers as well as min assist with standing tasks and transfers secondary to decreased safety awareness and impulsivity.  Reasons goals not met: N/A  Recommendation:  Patient will benefit from ongoing skilled OT services in home health setting to continue to advance functional skills in the area of BADL, iADL and Reduce care partner burden.  Equipment: No equipment provided Pt owns 3 in 1 BSC from knee surgery  Reasons for discharge: treatment goals met and discharge from hospital  Patient/family agrees with progress made and goals achieved: Yes  OT Discharge Precautions/Restrictions  Precautions Precautions: Fall Precaution Comments: impulsive Pain Pain Assessment Pain Assessment: No/denies pain ADL ADL Grooming: Supervision/safety Where Assessed-Grooming: Sitting at sink Upper Body Bathing: Setup Where Assessed-Upper Body Bathing: Sitting at sink Lower Body Bathing: Minimal assistance Where Assessed-Lower Body Bathing: Sitting at sink Upper Body Dressing: Setup;Moderate cueing Where Assessed-Upper Body Dressing: Sitting at sink Lower Body Dressing: Minimal assistance;Moderate cueing Where Assessed-Lower Body Dressing: Sitting at sink;Standing at sink Toileting: Moderate assistance Where Assessed-Toileting: Teacher, adult education:  Curator Method: Proofreader: Acupuncturist: Insurance underwriter Method: Designer, industrial/product: Information systems manager with back Vision/Perception  Vision - History Baseline Vision: Wears glasses only for reading Patient Visual Report: No change from baseline Vision - Assessment Eye Alignment: Within Functional Limits Additional Comments: Decreased Lt attention and difficulty scanning and attending to [employer] Perception: Impaired Inattention/Neglect: Other (comment) (decreased attention to Lt side of body/visual field) Praxis Praxis: Impaired Praxis Impairment Details: Motor planning;Perseveration;Initiation Praxis-Other Comments: Patient presents with delayed process and initiation when cued; impaired motor planning during functional mobility and severe perseveration during functional mobility tasks   Cognition Overall Cognitive Status: Impaired Arousal/Alertness: Lethargic Orientation Level: Oriented X4 Sensation Sensation Light Touch: Appears Intact Stereognosis: Not tested Hot/Cold: Not tested Proprioception: Appears Intact Coordination Gross Motor Movements are Fluid and Coordinated: No Fine Motor Movements are Fluid and Coordinated: No Coordination and Movement Description: Significant L LE hypertonia Heel Shin Test: Severe dysmetria on L LE Motor  Motor Motor: Hemiplegia;Abnormal tone;Motor apraxia;Motor perseverations;Abnormal postural alignment and control Motor - Skilled Clinical Observations: L hemiplegia, ? tone in LLE vs. premorbid decreased ROM knee and ankle, motor apraxia and perseveration during functional mobility Motor - Discharge Observations: L hemiplegia and increased tone persist; also presents with continued L knee decreased ROM, motor apraxia and perseverations less severe but still persist; stands with LLE ER, trunk and pelvis retracted  with trunk lateral lean and flexion  Extremity/Trunk Assessment RUE Assessment RUE Assessment: Within Functional Limits LUE Assessment LUE Assessment: Exceptions to [facility] (full shoulder ROM, elbow approx 100 degrees, decreased grasp)  See FIM for current functional status  Leonette Monarch 01/22/2012, 3:20 PM

## 2012-01-22 NOTE — Progress Notes (Signed)
Called to room by nursing after questionable increase in lethargy. Patient was sitting at sink side is wheelchair of occupational therapy. He was easily arousable blood pressures were acceptable and blood sugar 175. Patient just received his seizure medications earlier in the morning questionable lethargy secondary seizure medication. Patient appeared to be doing well as I entered the room and would followup as therapies continued

## 2012-01-22 NOTE — Progress Notes (Signed)
Occupational Therapy Session Note  Patient Details  Name: Brendan Scott MRN: [id number] Date of Birth: [birth year]  Today's Date: 01/22/2012 Time: 1030-1130 and 1330-1400 Time Calculation (min): 60 min and 30 min  Short Term Goals: Week 4:  OT Short Term Goal 1 (Week 4): Pt will complete toileting with min assist (3/3 steps with steadying assistance) OT Short Term Goal 2 (Week 4): Pt will demonstrate increased grasp with Lt hand by pulling up pants in standing OT Short Term Goal 3 (Week 4): Pt will complete UB dressing with min assist 3/4 times OT Short Term Goal 4 (Week 4): Pt will complete LB dressing with min assist  Skilled Therapeutic Interventions/Progress Updates:    1) Pt completed ADL retraining at sink with focus on hands on education with wife and increased carryover of safety and hemi-technique with bathing and dressing. Pt in bed upon arrival and pt's wife reports he was falling asleep in prior session.  Upon getting pt OOB, he was soaked in sweat and disoriented.  RN and PA notified, vitals taken and all WNL.  Pt with increased alertness and orientation and able to complete session, post ~5 min rest after discussion with PA.  Pt required mod cues for sequencing with dressing and reorientation with LB dressing when incorrectly donned pants, but was unable to identify and correct.  Educated pt's wife on allowing him increased time to attempt tasks prior to stepping in to assist and for her verbal cues to be short and specific with time to process.    2) 1:1 OT with focus on hands on education with wife during functional transfers in ADL apartment.  Pt ambulated to toilet with min assist with use of hemi-walker and cues for posture and Lt attention with transfer.  Pt's wife return demonstrated safety with ambulation with pt, requiring cues for hand placement at trunk instead of pulling on arm. Pt's wife reports understanding of cues to provide and when to increase safety and independence  with ADLs.  Therapy Documentation Precautions:  Precautions Precautions: Fall Precaution Comments: impulsive Restrictions Weight Bearing Restrictions: No General:   Vital Signs: Therapy Vitals Pulse Rate: 106  BP: 150/76 mmHg (notified Leta Jungling, PA) Patient Position, if appropriate: Sitting Pain: Pain Assessment Pain Assessment: No/denies pain ADL: ADL Grooming: Supervision/safety Where Assessed-Grooming: Sitting at sink Upper Body Bathing: Setup Where Assessed-Upper Body Bathing: Sitting at sink Lower Body Bathing: Minimal assistance Where Assessed-Lower Body Bathing: Sitting at sink Upper Body Dressing: Setup;Moderate cueing Where Assessed-Upper Body Dressing: Sitting at sink Lower Body Dressing: Minimal assistance;Moderate cueing Where Assessed-Lower Body Dressing: Sitting at sink;Standing at sink Toileting: Moderate assistance Where Assessed-Toileting: Teacher, adult education: Curator Method: Proofreader: Engineer, manufacturing systems Transfer: Insurance underwriter Method: Designer, industrial/product: Shower seat with back  See FIM for current functional status  Therapy/Group: Individual Therapy  Leonette Monarch 01/22/2012, 12:31 PM

## 2012-01-22 NOTE — Discharge Summary (Signed)
Brendan Scott, Brendan Scott NO.:  [id number]  MEDICAL RECORD NO.:  [id number]  LOCATION:  4034                         FACILITY:  [facility]  PHYSICIAN:  Erick Colace, M.D.DATE OF BIRTH:  11-11-1945  DATE OF ADMISSION:  12/28/2011 DATE OF DISCHARGE:  01/23/2012                              DISCHARGE SUMMARY   DISCHARGE DIAGNOSES: 1. Large right middle cerebral artery distribution nonhemorrhagic     infarction. 2. Chronic Coumadin for atrial fibrillation and stroke prophylaxis,     recurrent seizures, hypertension, Proteus urinary tract infection     resolved, dysphagia, and diabetes mellitus,  This is a 66 year old right-handed male with history of atrial fibrillation on aspirin therapy prior to admission as well as diabetes mellitus, who was admitted December 15, 2011, after being found in his driveway by family unresponsive.  EMS arrived and noted the patient to be slightly confused with left facial droop and dysarthria.  MRI of the brain showed a large right middle cerebral artery distribution, nonhemorrhagic infarct as well as incidental findings of large left sphenoid wing mass consistent with meningioma.  He was not considered a candidate for intravenous thrombolytic therapy with tPA.  MRA of the head showed high-grade stenosis proximal basilar artery.  Echocardiogram with ejection fraction 60%.  No wall motion abnormalities without emboli.  Cerebral angiogram showed no occlusions or stenosis.  There was reported seizure December 16, 2011 and the patient loaded with Keppra, Dilantin as well as Vimpat.  EEG did not show definite epileptiform discharges.  Throughout the hospital course, he had recurrent seizures that had since resolved December 23, 2011.  Neurology Service consulted, maintained on aspirin therapy as well as subcutaneous Lovenox.  On November 25, EKG consistent with atrial fibrillation with cardiac enzymes negative and Coumadin was initiated  and aspirin discontinued at that time.  He remained on subcutaneous Lovenox until INR greater than 2.00 for both stroke prophylaxis and recent atrial fibrillation with cardiac rate now normal sinus rhythm.  Speech therapy followup, maintained on a dysphagia II pudding thick liquid diet.  He did complete a course of Rocephin for Proteus urinary tract infection.  He was admitted for comprehensive rehab program.  PAST MEDICAL HISTORY:  See discharge diagnoses.  SOCIAL HISTORY:  Lives with spouse, 1 level home, 4 steps to entry. Functional history prior to admission was independent.  Worked full time as a Arboriculturist.  Functional status upon admission to rehab services was +2 total assist sit to supine, +2 total assist scooting to the head of the bed.  PHYSICAL EXAMINATION:  VITAL SIGNS:  Blood pressure 133/95, pulse 78, temperature 97.6, respirations 18. GENERAL:  This was an alert male.  He was somewhat impulsive.  His speech was dysarthric, but intelligible.  He did have some left inattention. HEENT:  Pupils are round and reactive to light. LUNGS:  Clear to auscultation. CARDIAC:  Regular rate and rhythm. ABDOMEN:  Soft, nontender.  Good bowel sounds.  REHABILITATION HOSPITAL COURSE:  The patient was admitted to inpatient rehab services with therapies initiated on a 3-hour daily basis consisting of physical therapy, occupational therapy, speech therapy, and rehabilitation nursing.  The following issues were addressed during the  patient's rehabilitation stay.  Pertaining to [name] right middle cerebral artery distribution, nonhemorrhagic infarct remained stable followed by Neurology Services.  He was now on chronic Coumadin therapy for both recent atrial fibrillation with cardiac rate now controlled, normal sinus as well as stroke prophylaxis with no bleeding episodes.  Latest INR of 2.86.  He would be followed by Dr. Regino Schultze of [place], [place] at [id number] upon his  discharge home.  Noted recurrent seizures.  He had been followed by Neurology Services.  He remained on Klonopin, Vimpat, Keppra, and Dilantin with no seizure activity noted during his rehab stay.  There was some discussion in regards to weaning and tapering to simplify seizure medications at this time.  Neurology Services request to continue current regimen.  His blood pressures remained well controlled with no orthostatic changes. He completed a course of Rocephin for Proteus urinary tract infection. His diet had been advanced to dysphagia II with thin liquids of which he was tolerating well with no signs of aspiration.  He remained on Glucophage for history of diabetes mellitus.  The patient received weekly collaborative interdisciplinary team conferences to discuss estimated length of stay, family teaching, and any barriers to his discharge.  He was continent of bowel and bladder, minimal to steady assist for bathing, moderate assist upper body dressing, moderate assist lower body dressing, minimal assist transfers and toileting, moderate to max assist for verbal and tactile cues for sequencing.  Supervision minimal assist overall depending on arousal level for his therapies. Full family teaching was completed.  He was discharged to home.  DISCHARGE MEDICATIONS: 1. Klonopin 0.5 mg at bedtime. 2. Vimpat 300 mg b.i.d. 3. Keppra 2500 mg every 12 hours. 4. Lisinopril 5 mg daily. 5. Glucophage 500 mg daily. 6. Dilantin 100 mg t.i.d. 7. Zocor 20 mg daily. 8. Coumadin 5 mg daily adjusted accordingly for an INR of 2.0 to 3.0.  DIET:  Dysphagia II thin liquid.  SPECIAL INSTRUCTIONS:  Included home health nurse to check INR on Friday December 27, results with Dr. Regino Schultze of [place], [place] [id number], fax [phone]. Follow up PM&R Dr Wynn Banker in 3-4 weeks    Mariam Dollar, P.A.   ______________________________ Erick Colace, M.D.    DA/MEDQ  D:  01/22/2012  T:   01/22/2012  Job:  [id number]  cc:   Erick Colace, M.D. Pramod P. Pearlean Brownie, MD Kirk Ruths, M.D.

## 2012-01-22 NOTE — Progress Notes (Signed)
Occupational Therapy Note  Patient Details  Name: Brendan Scott MRN: [id number] Date of Birth: [birth year] Today's Date: 01/22/2012  Time: 1200-1225 (cotx with Speech Therapy-group time [phone]) Pt denies pain Group therapy Pt participated with self feeding group with focus on increased use of LUE as stabilizer with containers.  Pt requires max A to use LUE/hand as stabilizer.  Pt requires constant verbal cues for swallowing strategies and for clearing mouth out before taking another bite.  Pt's wife present and provided appropriate supervision.   Lavone Neri Mchs [place] 01/22/2012, 3:52 PM

## 2012-01-22 NOTE — Progress Notes (Signed)
Recreational Therapy Discharge Summary Patient Details  Name: Brendan Scott MRN: [id number] Date of Birth: Nov 14, 1945 Today's Date: 01/22/2012  Long term goals set: 1  Long term goals met: 1  Comments on progress toward goals: Pt has made good progress toward goal and is ready for discharge home with family to provide 24 hour supervision/assist.  Pt continues to require verbal cues for L inattention, safety, cognition. Reasons for discharge: discharge from hospital  Patient/family agrees with progress made and goals achieved: Yes  Kirrah Mustin 01/22/2012, 3:26 PM

## 2012-01-22 NOTE — Discharge Summary (Signed)
  Discharge summary job # [id number]

## 2012-01-22 NOTE — Progress Notes (Signed)
Social Work Discharge Note Discharge Note  The overall goal for the admission was met for:   Discharge location: Yes-HOME WITH WIFE AND GRANDCHILDREN PROVIDING CARE  Length of Stay: Yes-27 DAYS  Discharge activity level: Yes-SUPERVISION/MIN LEVEL  Home/community participation: Yes  Services provided included: MD, RD, PT, OT, SLP, RN, TR, Pharmacy, Neuropsych and SW  Financial Services: Medicare and Private Insurance: [place] BCBS  Follow-up services arranged: Home Health: [facility]-PT, OT, RN, SPT, DME: [facility]-WHEELCHA[facility], HEMI-WALKER, BSC and Patient/Family has no preference for HH/DME agencies  Comments (or additional information):  Patient/Family verbalized understanding of follow-up arrangements: Yes  Individual responsible for coordination of the follow-up plan: BETTY-WIFE  Confirmed correct DME delivered: Lucy Chris 01/22/2012    Lucy Chris

## 2012-01-22 NOTE — Progress Notes (Signed)
Speech Language Pathology Daily Session Note  Patient Details  Name: SRIRAM FEBLES MRN: [id number] Date of Birth: [birth year]  Today's Date: 01/22/2012 Time: 1027-2536 Time Calculation (min): 40 min  Short Term Goals: Week 3: SLP Short Term Goal 1 (Week 3): Patient will self-monitor and correct left anterior loss of textures and liquids during p.o. intake with mod assist verbal and question cues. SLP Short Term Goal 2 (Week 3): Patient will utilize swallowing compensatory strategies while consuming Dys.2 textures and thin liquids with mod assist verbal cues. SLP Short Term Goal 3 (Week 3): Patient will demonstrate selective attention to a functional task for 10 minutes with mod assist verbal and question cues for redirection. SLP Short Term Goal 4 (Week 3): Patient will attend to left enviornment/body with mod assist tactile and verbal cues. SLP Short Term Goal 5 (Week 3): Patient will maintain topic of conversation for ~3 turns with max assist verbal and question cues for redirection. SLP Short Term Goal 6 (Week 3): Patient will identify request help as neeed with mod assist verbal cues.   Skilled Therapeutic Interventions: Skilled treatment session focused on addressing cognition and completing family education with cuing techniques.  SLP facilitated session with basic card game and max faded to mod assist verbal, visual ad tactile cues to self-monitor and correct moments perseveration.  Wife verbalized understanding and demonstrated multi-modal cuing techniques.  SLP recommended home management strategies such as routines, keeping things in the same, familiar place and external aids to assist with recall of information.  Wife verbalized understanding.     FIM:  Comprehension Comprehension Mode: Auditory Comprehension: 3-Understands basic 50 - 74% of the time/requires cueing 25 - 50%  of the time Expression Expression Mode: Verbal Expression: 4-Expresses basic 75 - 89% of the time/requires  cueing 10 - 24% of the time. Needs helper to occlude trach/needs to repeat words. Social Interaction Social Interaction: 4-Interacts appropriately 75 - 89% of the time - Needs redirection for appropriate language or to initiate interaction. Problem Solving Problem Solving: 3-Solves basic 50 - 74% of the time/requires cueing 25 - 49% of the time Memory Memory: 4-Recognizes or recalls 75 - 89% of the time/requires cueing 10 - 24% of the time FIM - Eating Eating Activity: 4: Helper checks for pocketed food  Pain Pain Assessment Pain Assessment: No/denies pain  Therapy/Group: Individual Therapy   Speech Language Pathology Discharge Summary  Patient Details  Name: AYINDE SWIM MRN: [id number] Date of Birth: 1945-07-16  Today's Date: 01/22/2012  Patient has met 4 of 7 long term goals.  Patient to discharge at overall Min;Mod level.  Reasons goals not met: Fluctuating cognition   Clinical Impression/Discharge Summary: Patient met 4 out of 7 long term goals during CIR stay due to functional gains in swallow, attention, awareness, initiation, cessation which impact all cognitive-linguistic tasks.  Patient progressed from Dys.1 textures with honey-thick liquids to Dys.2 textures and thin liquids with min-mod assist multi-modal cues.  Patient continues to require min-mod assist with all basic ADLs due to his fluctuating cognition throughout the day as a result of his CVA and his seizure medications.  All family education is complete and SLP recommends follow up at the next level of care for diet advancement as well as cognitive based therapy.    Care Partner:  Caregiver Able to Provide Assistance: Yes  Type of Caregiver Assistance: Physical;Cognitive  Recommendation:  Home Health SLP;24 hour supervision/assistance  Rationale for SLP Follow Up: Maximize cognitive function and independence;Maximize swallowing safety;Reduce caregiver  burden   Equipment: none   Reasons for discharge:  Discharged from hospital   Patient/Family Agrees with Progress Made and Goals Achieved: Yes   See FIM for current functional status  Charlane Ferretti., CCC-SLP [phone]  Joscelyn Hardrick 01/22/2012, 4:04 PM

## 2012-01-22 NOTE — Discharge Instructions (Signed)
Inpatient Rehab Discharge Instructions  Brendan Scott Discharge date and time: No discharge date for patient encounter.   Activities/Precautions/ Functional Status: Activity: activity as tolerated Diet: Dysphagia 2 with diabetic restrictions Wound Care: None needed Functional status:  ___ No restrictions     ___ Walk up steps independently _x__ 24/7 supervision/assistance   ___ Walk up steps with assistance ___ Intermittent supervision/assistance  ___ Bathe/dress independently ___ Walk with walker     ___ Bathe/dress with assistance ___ Walk Independently    ___ Shower independently __x STROKE/TIA DISCHARGE INSTRUCTIONS SMOKING Cigarette smoking nearly doubles your risk of having a stroke & is the single most alterable risk factor  If you smoke or have smoked in the last 12 months, you are advised to quit smoking for your health.  Most of the excess cardiovascular risk related to smoking disappears within a year of stopping.  Ask you doctor about anti-smoking medications  [place] Quit Line: 1-800-QUIT NOW  Free Smoking Cessation Classes [phone]  CHOLESTEROL Know your levels; limit fat & cholesterol in your diet  Lipid Panel     Component Value Date/Time   CHOL 184 12/16/2011 0430   TRIG 51 12/16/2011 0430   HDL 60 12/16/2011 0430   CHOLHDL 3.1 12/16/2011 0430   VLDL 10 12/16/2011 0430   LDLCALC 114* 12/16/2011 0430      Many patients benefit from treatment even if their cholesterol is at goal.  Goal: Total Cholesterol (CHOL) less than 160  Goal:  Triglycerides (TRIG) less than 150  Goal:  HDL greater than 40  Goal:  LDL (LDLCALC) less than 100   BLOOD PRESSURE American Stroke Association blood pressure target is less that 120/80 mm/Hg  Your discharge blood pressure is:  BP: 103/66 mmHg  Monitor your blood pressure  Limit your salt and alcohol intake  Many individuals will require more than one medication for high blood pressure  DIABETES (A1c is a blood sugar  average for last 3 months) Goal HGBA1c is under 7% (HBGA1c is blood sugar average for last 3 months)  Diabetes: Diagnosis of diabetes:  {STROKE DC Z6X:09604}    Lab Results  Component Value Date   HGBA1C 6.9* 12/16/2011     Your HGBA1c can be lowered with medications, healthy diet, and exercise.  Check your blood sugar as directed by your physician  Call your physician if you experience unexplained or low blood sugars.  PHYSICAL ACTIVITY/REHABILITATION Goal is 30 minutes at least 4 days per week  Activity: {STROKE DC ACTIVITIES:22360} Therapies: {STROKE DC THERAPIES:22361} Return to work: ***  Activity decreases your risk of heart attack and stroke and makes your heart stronger.  It helps control your weight and blood pressure; helps you relax and can improve your mood.  Participate in a regular exercise program.  Talk with your doctor about the best form of exercise for you (dancing, walking, swimming, cycling).  DIET/WEIGHT Goal is to maintain a healthy weight  Your discharge diet is: Dysphagia *** liquids Your height is:  Height: 5\' 6"  (167.6 cm) Your current weight is: Weight: 89 kg (196 lb 3.4 oz) Your Body Mass Index (BMI) is:  BMI (Calculated): 34   Following the type of diet specifically designed for you will help prevent another stroke.  Your goal weight range is:  ***  Your goal Body Mass Index (BMI) is 19-24.  Healthy food habits can help reduce 3 risk factors for stroke:  High cholesterol, hypertension, and excess weight.  RESOURCES Stroke/Support Group:  Call [phone]   STROKE EDUCATION PROVIDED/REVIEWED AND GIVEN TO PATIENT Stroke warning signs and symptoms How to activate emergency medical system (call 911). Medications prescribed at discharge. Need for follow-up after discharge. Personal risk factors for stroke. Pneumonia vaccine given: {STROKE DC YES/NO/DATE:22363} Flu vaccine given: {STROKE DC YES/NO/DATE:22363} My questions have been answered, the  writing is legible, and I understand these instructions.  I will adhere to these goals & educational materials that have been provided to me after my discharge from the hospital.     _ Walk with assistance    ___ Shower with assistance ___ No alcohol     ___ Return to work/school ________  Special Instructions:  Home health nurse to check INR on Friday, 01/26/2012 results to Dr. Sherrin Daisy [facility] phone number [phone] fax number [phone]    COMMUNITY REFERRALS UPON DISCHARGE:    Home Health:   PT, OT, SPT, RN    Agency:[facility] Phone:[phone] Date of last service:01/23/2012  Medical Equipment/Items Ordered:WHEELCHAIR, BSC, [facility]  Agency/Supplier:[facility]  GENERAL COMMUNITY RESOURCES FOR PATIENT/FAMILY: Support Groups:[facility] SUPPORT GROUP    My questions have been answered and I understand these instructions. I will adhere to these goals and the provided educational materials after my discharge from the hospital.  Patient/Caregiver Signature _______________________________ Date __________  Clinician Signature _______________________________________ Date __________  Please bring this form and your medication list with you to all your follow-up doctor's appointments.

## 2012-01-22 NOTE — Progress Notes (Signed)
Recreational Therapy Session Note  Patient Details  Name: BRADLEE HEITMAN MRN: [id number] Date of Birth: 06-30-1945 Today's Date: 01/22/2012 Time:  1400-1425 Pain: no c/o Skilled Therapeutic Interventions/Progress Updates: Pt stood with supervision to place Connect 4 checkers into frame using LUE.  After given verbal instructions, pt initially did not require verbal cues to attend to the left or to use LUE.  When fatigued, pt required verbal cues, extra time & multiple attempts to manipulate checkers and place in frame.  Therapy/Group: Co-Treatment Lashon Hillier 01/22/2012, 3:22 PM

## 2012-01-22 NOTE — Progress Notes (Signed)
Patient ID: Brendan Scott, male   DOB: 1946/01/15, 66 y.o.   MRN: [id number] Subjective/Complaints:  Cooperative with medications still poor compliance with hand split. Discussed this with patient Review of Systems  Neurological: Positive for focal weakness.  All other systems reviewed and are negative.     Objective: Vital Signs: Blood pressure 117/79, pulse 74, temperature 97.5 F (36.4 C), temperature source Oral, resp. rate 18, height 5\' 6"  (1.676 m), weight 89 kg (196 lb 3.4 oz), SpO2 100.00%. No results found. Results for orders placed during the hospital encounter of 12/27/11 (from the past 72 hour(s))  GLUCOSE, CAPILLARY     Status: Abnormal   Collection Time   01/19/12 11:26 AM      Component Value Range Comment   Glucose-Capillary 165 (*) 70 - 99 mg/dL    Comment 1 Notify RN     GLUCOSE, CAPILLARY     Status: Abnormal   Collection Time   01/19/12  1:39 PM      Component Value Range Comment   Glucose-Capillary 166 (*) 70 - 99 mg/dL    Comment 1 Notify RN     GLUCOSE, CAPILLARY     Status: Abnormal   Collection Time   01/19/12  4:24 PM      Component Value Range Comment   Glucose-Capillary 122 (*) 70 - 99 mg/dL    Comment 1 Notify RN     GLUCOSE, CAPILLARY     Status: Abnormal   Collection Time   01/19/12  9:11 PM      Component Value Range Comment   Glucose-Capillary 102 (*) 70 - 99 mg/dL   PROTIME-INR     Status: Abnormal   Collection Time   01/20/12  4:41 AM      Component Value Range Comment   Prothrombin Time 28.5 (*) 11.6 - 15.2 seconds    INR 2.86 (*) 0.00 - 1.49   GLUCOSE, CAPILLARY     Status: Abnormal   Collection Time   01/20/12  8:45 AM      Component Value Range Comment   Glucose-Capillary 147 (*) 70 - 99 mg/dL    Comment 1 Documented in Chart     GLUCOSE, CAPILLARY     Status: Abnormal   Collection Time   01/20/12 11:07 AM      Component Value Range Comment   Glucose-Capillary 216 (*) 70 - 99 mg/dL    Comment 1 Documented in Chart      GLUCOSE, CAPILLARY     Status: Normal   Collection Time   01/20/12  4:55 PM      Component Value Range Comment   Glucose-Capillary 95  70 - 99 mg/dL   GLUCOSE, CAPILLARY     Status: Abnormal   Collection Time   01/20/12  9:17 PM      Component Value Range Comment   Glucose-Capillary 175 (*) 70 - 99 mg/dL    Comment 1 Notify RN     GLUCOSE, CAPILLARY     Status: Abnormal   Collection Time   01/21/12  8:07 AM      Component Value Range Comment   Glucose-Capillary 156 (*) 70 - 99 mg/dL   PROTIME-INR     Status: Abnormal   Collection Time   01/21/12  8:42 AM      Component Value Range Comment   Prothrombin Time 23.0 (*) 11.6 - 15.2 seconds    INR 2.14 (*) 0.00 - 1.49   GLUCOSE, CAPILLARY  Status: Normal   Collection Time   01/21/12  4:42 PM      Component Value Range Comment   Glucose-Capillary 85  70 - 99 mg/dL   GLUCOSE, CAPILLARY     Status: Abnormal   Collection Time   01/21/12  8:53 PM      Component Value Range Comment   Glucose-Capillary 170 (*) 70 - 99 mg/dL    Comment 1 Notify RN     GLUCOSE, CAPILLARY     Status: Abnormal   Collection Time   01/22/12  7:16 AM      Component Value Range Comment   Glucose-Capillary 137 (*) 70 - 99 mg/dL    Comment 1 Notify RN       HEENT: L lower facial droop  Cardio: RRR  Resp: Clear to auscultation GI: BS positive  Extremity: Pulses positive and No Edema  Skin: Intact  Neuro: Awake oriented to place, month,situation, Cranial Nerve Abnormalities L central VII, Normal Sensory, Abnormal Motor 2-/5 L hand , 3-/5 Bi.tri,delt,HF,KE,ankle DF/PF, Dysarthric, Inattention and  Left neglect, no field cut  Musc/Skel: Normal and Other no shoulder pain, L finger flexor contracture no ataxia  Still pocketing food  Labs reviewed Assessment/Plan: 1. Functional deficits secondary to R MCA infarct ready for D/C in am   FIM: FIM - Bathing Bathing Steps Patient Completed: Chest;Right Arm;Left Arm;Abdomen;Front perineal area;Buttocks;Right  upper leg;Left upper leg Bathing: 4: Min-Patient completes 8-9 58f 10 parts or 75+ percent  FIM - Upper Body Dressing/Undressing Upper body dressing/undressing steps patient completed: Thread/unthread left sleeve of pullover shirt/dress;Put head through opening of pull over shirt/dress;Thread/unthread right sleeve of pullover shirt/dresss;Pull shirt over trunk Upper body dressing/undressing: 4: Min-Patient completed 75 plus % of tasks FIM - Lower Body Dressing/Undressing Lower body dressing/undressing steps patient completed: Thread/unthread right underwear leg;Thread/unthread left underwear leg;Thread/unthread right pants leg;Thread/unthread left pants leg;Don/Doff right shoe;Don/Doff left shoe Lower body dressing/undressing: 4: Min-Patient completed 75 plus % of tasks  FIM - Toileting Toileting steps completed by patient: Performs perineal hygiene Toileting Assistive Devices: Grab bar or rail for support Toileting: 4: Steadying assist  FIM - Diplomatic Services operational officer Devices: Grab bars Toilet Transfers: 4-To toilet/BSC: Min A (steadying Pt. > 75%)  FIM - Bed/Chair Transfer Bed/Chair Transfer Assistive Devices: Therapist, occupational: 5: Bed > Chair or W/C: Supervision (verbal cues/safety issues);5: Chair or W/C > Bed: Supervision (verbal cues/safety issues)  FIM - Locomotion: Wheelchair Distance: 150 Locomotion: Wheelchair: 1: Total Assistance/staff pushes wheelchair (Pt<25%) FIM - Locomotion: Ambulation Locomotion: Ambulation Assistive Devices: Chief Operating Officer Ambulation/Gait Assistance: 4: Min guard Locomotion: Ambulation: 4: Travels 150 ft or more with minimal assistance (Pt.>75%)  Comprehension Comprehension Mode: Visual Comprehension: 3-Understands basic 50 - 74% of the time/requires cueing 25 - 50%  of the time  Expression Expression Mode: Verbal Expression: 5-Expresses basic 90% of the time/requires cueing < 10% of the time.  Social  Interaction Social Interaction: 4-Interacts appropriately 75 - 89% of the time - Needs redirection for appropriate language or to initiate interaction.  Problem Solving Problem Solving: 3-Solves basic 50 - 74% of the time/requires cueing 25 - 49% of the time  Memory Memory: 3-Recognizes or recalls 50 - 74% of the time/requires cueing 25 - 49% of the time  . 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 DVT Prophylaxis/Anticoagulation: Chronic Coumadin therapy INR therapeutic on Pharm protocol. Monitor platelet counts any signs of bleeding  3. Neuropsych: This patient is not capable of making decisions on  his/her own behalf.  4. Epilepsia partialis continua now controlled. Continue Klonopin 0.5 mg each bedtime, vimpat 300mg  bid,keppra 2500mg  Q12hour, Dilantin 100 mg 3 times a day. Monitor closely for a seizure activity. Wife would like a neurologist closer to home.  Wife will try calling Dr Ninetta Lights 5. Hypertension/atrial fibrillation of new onset. Lisinopril 5 mg daily. Cardiac rate is controlled. There's been no reports of chest pain or shortness of breath   7. Dysphagia. Monitor for signs of aspiration 8. Non-insulin-dependent diabetes mellitus: Hemoglobin A1c 6.9.. Patient currently with sliding scale insulin. 9.  L finger flexor contracture will place wrist hand orthosis   LOS (Days) 26 A FACE TO FACE EVALUATION WAS PERFORMED  KIRSTEINS,ANDREW E 01/22/2012, 9:46 AM

## 2012-01-22 NOTE — Progress Notes (Signed)
ANTICOAGULATION CONSULT NOTE - Follow Up Consult  Pharmacy Consult for Coumadin Indication: atrial fibrillation  No Known Allergies  Patient Measurements: Height: 5\' 6"  (167.6 cm) Weight: 196 lb 3.4 oz (89 kg) IBW/kg (Calculated) : 63.8  Heparin Dosing Weight:   Vital Signs: Temp: 97.5 F (36.4 C) (12/23 0700) Temp src: Oral (12/23 0700) BP: 150/76 mmHg (12/23 1100) Pulse Rate: 106  (12/23 1100)  Labs:  Basename 01/22/12 1000 01/21/12 0842 01/20/12 0441  HGB -- -- --  HCT -- -- --  PLT -- -- --  APTT -- -- --  LABPROT 22.8* 23.0* 28.5*  INR 2.11* 2.14* 2.86*  HEPARINUNFRC -- -- --  CREATININE -- -- --  CKTOTAL -- -- --  CKMB -- -- --  TROPONINI -- -- --    Estimated Creatinine Clearance: 69.7 ml/min (by C-G formula based on Cr of 1.09).   Medications:  Scheduled:     . clonazePAM  0.5 mg Oral QHS  . feeding supplement  1 Container Oral Q24H  . feeding supplement  237 mL Oral BID BM  . hydrocerin   Topical BID  . insulin aspart  0-9 Units Subcutaneous TID WC  . lacosamide  300 mg Oral BID  . levETIRAcetam  2,500 mg Oral Q12H  . lisinopril  5 mg Oral Daily  . metFORMIN  500 mg Oral Q breakfast  . phenytoin  100 mg Oral TID  . senna-docusate  1 tablet Oral QHS  . simvastatin  20 mg Oral q1800  . [COMPLETED] warfarin  5 mg Oral ONCE-1800  . Warfarin - Pharmacist Dosing Inpatient   Does not apply q1800    Assessment: 66 yo male with AFib.  INR remains stable at 2.11 this am.  Recent large drop likely due to held dose on Friday.  No bleeding problems noted.  Goal of Therapy:  INR 2-3 Monitor platelets by anticoagulation protocol: Yes   Plan:  1.  Coumadin 5mg  today 2.  Daily INR  Daleigh Pollinger L. Illene Bolus, PharmD, BCPS Clinical Pharmacist Pager: [phone] Pharmacy: [phone] 01/22/2012 12:29 PM

## 2012-01-23 LAB — PROTIME-INR: Prothrombin Time: 27.5 seconds — ABNORMAL HIGH (ref 11.6–15.2)

## 2012-01-23 MED ORDER — METFORMIN HCL 850 MG PO TABS: 850.0000 mg | ORAL_TABLET | Freq: Every day | ORAL | Status: DC

## 2012-01-23 MED ORDER — PHENYTOIN 50 MG PO CHEW: 100.0000 mg | CHEWABLE_TABLET | Freq: Three times a day (TID) | ORAL | Status: DC

## 2012-01-23 MED ORDER — METFORMIN HCL 850 MG PO TABS
850.0000 mg | ORAL_TABLET | Freq: Every day | ORAL | Status: DC
  Filled 2012-01-23: qty 1

## 2012-01-23 MED ORDER — CLONAZEPAM 0.5 MG PO TABS: 0.5000 mg | ORAL_TABLET | Freq: Every day | ORAL | Status: DC

## 2012-01-23 MED ORDER — WARFARIN SODIUM 5 MG PO TABS
5.0000 mg | ORAL_TABLET | Freq: Every day | ORAL | Status: DC
  Filled 2012-01-23: qty 1

## 2012-01-23 MED ORDER — LISINOPRIL 5 MG PO TABS: 5.0000 mg | ORAL_TABLET | Freq: Every day | ORAL | Status: DC

## 2012-01-23 MED ORDER — WARFARIN SODIUM 5 MG PO TABS: 5.0000 mg | ORAL_TABLET | Freq: Every day | ORAL | Status: DC

## 2012-01-23 MED ORDER — LEVETIRACETAM 500 MG PO TABS: 2500.0000 mg | ORAL_TABLET | Freq: Two times a day (BID) | ORAL | Status: DC

## 2012-01-23 MED ORDER — LACOSAMIDE 150 MG PO TABS: 300.0000 mg | ORAL_TABLET | Freq: Two times a day (BID) | ORAL | Status: DC

## 2012-01-23 MED ORDER — SIMVASTATIN 20 MG PO TABS: 20.0000 mg | ORAL_TABLET | Freq: Every day | ORAL | Status: DC

## 2012-01-23 NOTE — Progress Notes (Signed)
Pt discharged to home with wife at 67. All belongings with pt. Discharge instructions given by Deatra Ina, PA with all questions answered at this time. No further questions at this time.

## 2012-01-23 NOTE — Progress Notes (Signed)
Patient ID: MAN EFFERTZ, male   DOB: 12/01/1945, 66 y.o.   MRN: [id number] Subjective/Complaints:  Excited about D/C Review of Systems  Neurological: Positive for focal weakness.  All other systems reviewed and are negative.     Objective: Vital Signs: Blood pressure 119/72, pulse 93, temperature 97.6 F (36.4 C), temperature source Oral, resp. rate 16, height 5\' 6"  (1.676 m), weight 89 kg (196 lb 3.4 oz), SpO2 90.00%. No results found. Results for orders placed during the hospital encounter of 12/27/11 (from the past 72 hour(s))  GLUCOSE, CAPILLARY     Status: Abnormal   Collection Time   01/20/12 11:07 AM      Component Value Range Comment   Glucose-Capillary 216 (*) 70 - 99 mg/dL    Comment 1 Documented in Chart     GLUCOSE, CAPILLARY     Status: Normal   Collection Time   01/20/12  4:55 PM      Component Value Range Comment   Glucose-Capillary 95  70 - 99 mg/dL   GLUCOSE, CAPILLARY     Status: Abnormal   Collection Time   01/20/12  9:17 PM      Component Value Range Comment   Glucose-Capillary 175 (*) 70 - 99 mg/dL    Comment 1 Notify RN     GLUCOSE, CAPILLARY     Status: Abnormal   Collection Time   01/21/12  8:07 AM      Component Value Range Comment   Glucose-Capillary 156 (*) 70 - 99 mg/dL   PROTIME-INR     Status: Abnormal   Collection Time   01/21/12  8:42 AM      Component Value Range Comment   Prothrombin Time 23.0 (*) 11.6 - 15.2 seconds    INR 2.14 (*) 0.00 - 1.49   GLUCOSE, CAPILLARY     Status: Normal   Collection Time   01/21/12  4:42 PM      Component Value Range Comment   Glucose-Capillary 85  70 - 99 mg/dL   GLUCOSE, CAPILLARY     Status: Abnormal   Collection Time   01/21/12  8:53 PM      Component Value Range Comment   Glucose-Capillary 170 (*) 70 - 99 mg/dL    Comment 1 Notify RN     GLUCOSE, CAPILLARY     Status: Abnormal   Collection Time   01/22/12  7:16 AM      Component Value Range Comment   Glucose-Capillary 137 (*) 70 - 99 mg/dL     Comment 1 Notify RN     PROTIME-INR     Status: Abnormal   Collection Time   01/22/12 10:00 AM      Component Value Range Comment   Prothrombin Time 22.8 (*) 11.6 - 15.2 seconds    INR 2.11 (*) 0.00 - 1.49   GLUCOSE, CAPILLARY     Status: Abnormal   Collection Time   01/22/12 11:01 AM      Component Value Range Comment   Glucose-Capillary 175 (*) 70 - 99 mg/dL    Comment 1 Notify RN     GLUCOSE, CAPILLARY     Status: Abnormal   Collection Time   01/22/12  4:58 PM      Component Value Range Comment   Glucose-Capillary 107 (*) 70 - 99 mg/dL    Comment 1 Notify RN     GLUCOSE, CAPILLARY     Status: Abnormal   Collection Time   01/22/12  8:55  PM      Component Value Range Comment   Glucose-Capillary 146 (*) 70 - 99 mg/dL    Comment 1 Notify RN     PROTIME-INR     Status: Abnormal   Collection Time   01/23/12  8:05 AM      Component Value Range Comment   Prothrombin Time 27.5 (*) 11.6 - 15.2 seconds    INR 2.72 (*) 0.00 - 1.49     HEENT: L lower facial droop  Cardio: RRR  Resp: Clear to auscultation GI: BS positive  Extremity: Pulses positive and No Edema  Skin: Intact  Neuro: Awake oriented to place, month,situation, Cranial Nerve Abnormalities L central VII, Normal Sensory, Abnormal Motor 2-/5 L hand , 3-/5 Bi.tri,delt,HF,KE,ankle DF/PF, Dysarthric, Inattention and  Left neglect, no field cut  Musc/Skel: Normal and Other no shoulder pain, L finger flexor contracture no ataxia  Still pocketing food  Labs reviewed Assessment/Plan: 1. Functional deficits secondary to R MCA infarct ready for D/C  PMR F/u next month PCP f/u in 2 weeks Neuro f/u in one month  FIM: FIM - Bathing Bathing Steps Patient Completed: Chest;Right Arm;Left Arm;Abdomen;Front perineal area;Buttocks;Right upper leg;Left upper leg;Right lower leg (including foot);Left lower leg (including foot) Bathing: 4: Steadying assist  FIM - Upper Body Dressing/Undressing Upper body dressing/undressing steps  patient completed: Thread/unthread right sleeve of pullover shirt/dresss;Thread/unthread left sleeve of pullover shirt/dress;Put head through opening of pull over shirt/dress;Pull shirt over trunk Upper body dressing/undressing: 5: Set-up assist to: Obtain clothing/put away FIM - Lower Body Dressing/Undressing Lower body dressing/undressing steps patient completed: Thread/unthread right underwear leg;Thread/unthread left underwear leg;Pull underwear up/down;Thread/unthread right pants leg;Thread/unthread left pants leg;Don/Doff right shoe;Fasten/unfasten right shoe;Fasten/unfasten left shoe Lower body dressing/undressing: 4: Min-Patient completed 75 plus % of tasks  FIM - Toileting Toileting steps completed by patient: Performs perineal hygiene Toileting Assistive Devices: Grab bar or rail for support Toileting: 4: Steadying assist  FIM - Diplomatic Services operational officer Devices: Art gallery manager Transfers: 4-To toilet/BSC: Min A (steadying Pt. > 75%);4-From toilet/BSC: Min A (steadying Pt. > 75%)  FIM - Bed/Chair Transfer Bed/Chair Transfer Assistive Devices: Therapist, occupational: 5: Supine > Sit: Supervision (verbal cues/safety issues);5: Sit > Supine: Supervision (verbal cues/safety issues);5: Chair or W/C > Bed: Supervision (verbal cues/safety issues);5: Bed > Chair or W/C: Supervision (verbal cues/safety issues)  FIM - Locomotion: Wheelchair Distance: 150 Locomotion: Wheelchair: 1: Total Assistance/staff pushes wheelchair (Pt<25%) FIM - Locomotion: Ambulation Locomotion: Ambulation Assistive Devices: Chief Operating Officer Ambulation/Gait Assistance: 4: Min assist Locomotion: Ambulation: 4: Travels 150 ft or more with minimal assistance (Pt.>75%)  Comprehension Comprehension Mode: Auditory Comprehension: 3-Understands basic 50 - 74% of the time/requires cueing 25 - 50%  of the time  Expression Expression Mode: Verbal Expression: 4-Expresses basic 75 - 89% of the  time/requires cueing 10 - 24% of the time. Needs helper to occlude trach/needs to repeat words.  Social Interaction Social Interaction: 4-Interacts appropriately 75 - 89% of the time - Needs redirection for appropriate language or to initiate interaction.  Problem Solving Problem Solving: 3-Solves basic 50 - 74% of the time/requires cueing 25 - 49% of the time  Memory Memory: 4-Recognizes or recalls 75 - 89% of the time/requires cueing 10 - 24% of the time  . 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 DVT Prophylaxis/Anticoagulation: Chronic Coumadin therapy INR therapeutic on Pharm protocol. Monitor platelet counts any signs of bleeding  3. Neuropsych: This patient is not capable of making decisions on his/her own  behalf.  4. Epilepsia partialis continua now controlled. Continue Klonopin 0.5 mg each bedtime, vimpat 300mg  bid,keppra 2500mg  Q12hour, Dilantin 100 mg 3 times a day. Monitor closely for a seizure activity. Wife would like a neurologist closer to home.  Wife will try calling Dr Ninetta Lights 5. Hypertension/atrial fibrillation of new onset. Lisinopril 5 mg daily. Cardiac rate is controlled. There's been no reports of chest pain or shortness of breath   7. Dysphagia. Monitor for signs of aspiration 8. Non-insulin-dependent diabetes mellitus: Hemoglobin A1c 6.9.. Patient currently with sliding scale insulin. 9.  L finger flexor contracture will place wrist hand orthosis   LOS (Days) 27 A FACE TO FACE EVALUATION WAS PERFORMED  KIRSTEINS,ANDREW E 01/23/2012, 9:16 AM

## 2012-01-30 ENCOUNTER — Telehealth: Payer: Self-pay

## 2012-01-30 NOTE — Telephone Encounter (Signed)
Left message for Brendan Scott to call office regarding her phone call.

## 2012-01-30 NOTE — Telephone Encounter (Signed)
[facility] with [facility] called to let us know that the patients wife has put him back on glimepiride because metformin was not controlling his glucose.  Please call Hilda Lias.

## 2012-01-30 NOTE — Telephone Encounter (Signed)
Cira Servant to have patient contact primary caregiver regarding diabetic medication.

## 2012-02-05 DIAGNOSIS — G4710 Hypersomnia, unspecified: Secondary | ICD-10-CM | POA: Diagnosis not present

## 2012-02-05 DIAGNOSIS — I6990 Unspecified sequelae of unspecified cerebrovascular disease: Secondary | ICD-10-CM | POA: Diagnosis not present

## 2012-02-05 DIAGNOSIS — R569 Unspecified convulsions: Secondary | ICD-10-CM | POA: Diagnosis not present

## 2012-02-05 DIAGNOSIS — I6789 Other cerebrovascular disease: Secondary | ICD-10-CM | POA: Diagnosis not present

## 2012-02-09 DIAGNOSIS — R569 Unspecified convulsions: Secondary | ICD-10-CM | POA: Diagnosis not present

## 2012-02-20 ENCOUNTER — Encounter: Payer: BC Managed Care – PPO | Attending: Physical Medicine & Rehabilitation

## 2012-02-20 ENCOUNTER — Encounter: Payer: Self-pay | Admitting: Physical Medicine & Rehabilitation

## 2012-02-20 ENCOUNTER — Ambulatory Visit (HOSPITAL_BASED_OUTPATIENT_CLINIC_OR_DEPARTMENT_OTHER): Payer: Medicare Other | Admitting: Physical Medicine & Rehabilitation

## 2012-02-20 VITALS — BP 115/73 | HR 66 | Resp 14 | Ht 66.0 in | Wt 193.0 lb

## 2012-02-20 DIAGNOSIS — G8110 Spastic hemiplegia affecting unspecified side: Secondary | ICD-10-CM

## 2012-02-20 DIAGNOSIS — I69959 Hemiplegia and hemiparesis following unspecified cerebrovascular disease affecting unspecified side: Secondary | ICD-10-CM | POA: Diagnosis not present

## 2012-02-20 NOTE — Progress Notes (Signed)
Subjective:    Patient ID: Brendan Scott, male    DOB: 1945-08-25, 67 y.o.   MRN: [id number] This is a 67 year old right-handed male with history of atrial  fibrillation on aspirin therapy prior to admission as well as diabetes  mellitus, who was admitted December 15, 2011, after being found in his  driveway by family unresponsive. EMS arrived and noted the patient to  be slightly confused with left facial droop and dysarthria. MRI of the  brain showed a large right middle cerebral artery distribution,  nonhemorrhagic infarct as well as incidental findings of large left  sphenoid wing mass consistent with meningioma. He was not considered a  candidate for intravenous thrombolytic therapy with tPA. MRA of the  head showed high-grade stenosis proximal basilar artery. Echocardiogram  with ejection fraction 60%  HPI Complaints of tightness in the fingers of the left hand No falls No seizures Mildly Elevated liver function tests. I reviewed these with the patient's wife likely medication related do to statin or seizure medications instructed patient to discuss this with primary care physician as well Pain Inventory Average Pain 0 Pain Right Now 0 My pain is no pain  In the last 24 hours, has pain interfered with the following? General activity 0 Relation with others 0 Enjoyment of life 0 What TIME of day is your pain at its worst? no pain Sleep (in general) Good  Pain is worse with: no pain Pain improves with: no pain Relief from Meds: 0  Mobility use a cane how many minutes can you walk? 20 ability to climb steps?  yes do you drive?  no  Function disabled: date disabled 12/15/11 I need assistance with the following:  meal prep, household duties and shopping  Neuro/Psych weakness confusion  Prior Studies Any changes since last visit?  no  Physicians involved in your care Any changes since last visit?  no   Family History  Problem Relation Age of Onset  . Arthritis     . Diabetes     History   Social History  . Marital Status: Married    Spouse Name: N/A    Number of Children: N/A  . Years of Education: N/A   Social History Main Topics  . Smoking status: Former Games developer  . Smokeless tobacco: None     Comment: quit 30 years ago  . Alcohol Use: No  . Drug Use: No  . Sexually Active: None   Other Topics Concern  . None   Social History Narrative  . None   Past Surgical History  Procedure Date  . Total knee arthroplasty     Bilateral   Past Medical History  Diagnosis Date  . Arthritis   . Diabetes mellitus   . Atrial fibrillation    BP 115/73  Pulse 66  Resp 14  Ht 5\' 6"  (1.676 m)  Wt 193 lb (87.544 kg)  BMI 31.15 kg/m2  SpO2 90%    Review of Systems  Respiratory: Positive for shortness of breath.   Neurological: Positive for weakness.  Psychiatric/Behavioral: Positive for confusion.  All other systems reviewed and are negative.       Objective:   Physical Exam  Constitutional: He is oriented to person, place, and time. He appears well-developed and well-nourished.  HENT:  Head: Normocephalic and atraumatic.  Eyes: Conjunctivae normal and EOM are normal. Pupils are equal, round, and reactive to light.  Neurological: He is alert and oriented to person, place, and time. Gait normal.  Reflex  Scores:      Tricep reflexes are 2+ on the right side and 3+ on the left side.      Bicep reflexes are 2+ on the right side and 3+ on the left side.      Brachioradialis reflexes are 2+ on the right side and 3+ on the left side.      Patellar reflexes are 2+ on the right side and 3+ on the left side.      Achilles reflexes are 2+ on the right side and 3+ on the left side.      Motor strength is 4/5 in the left deltoid, biceps, triceps,  3 minus grip 4/5 in the left hip flexor, knee extensors, ankle dorsiflexor plantar flexor 5/5 on the right side  Decreased fine motor skills left hand. Increased tone left hand Ashworth grade 3  spasticity in the finger flexors  Psychiatric: He has a normal mood and affect. Thought content normal. His speech is delayed and slurred. He is inattentive.   Left neglect on visual confrontation testing       Assessment & Plan:  1. Right MCA distribution infarct with left spastic hemiplegia. His main problem with spasticity is in the left finger flexors FDS and FDP. He could benefit from Botox injection since he is failed both oral medications as well as physical therapy. Overall he has made good progress. His ambulation has improved to great degree. He still exhibits left neglect and is not able to drive. Return to clinic in 2 weeks for the Botox injection. Continue outpatient OT

## 2012-02-21 ENCOUNTER — Ambulatory Visit (HOSPITAL_COMMUNITY)
Admission: RE | Admit: 2012-02-21 | Discharge: 2012-02-21 | Disposition: A | Discharge status: Home / Self Care | Payer: BC Managed Care – PPO | Source: Ambulatory Visit | Attending: Family Medicine | Admitting: Family Medicine

## 2012-02-21 DIAGNOSIS — I6990 Unspecified sequelae of unspecified cerebrovascular disease: Secondary | ICD-10-CM | POA: Diagnosis not present

## 2012-02-21 DIAGNOSIS — G3184 Mild cognitive impairment, so stated: Secondary | ICD-10-CM | POA: Insufficient documentation

## 2012-02-21 DIAGNOSIS — R279 Unspecified lack of coordination: Secondary | ICD-10-CM | POA: Insufficient documentation

## 2012-02-21 DIAGNOSIS — E119 Type 2 diabetes mellitus without complications: Secondary | ICD-10-CM | POA: Insufficient documentation

## 2012-02-21 DIAGNOSIS — I69928 Other speech and language deficits following unspecified cerebrovascular disease: Secondary | ICD-10-CM | POA: Diagnosis not present

## 2012-02-21 DIAGNOSIS — M6281 Muscle weakness (generalized): Secondary | ICD-10-CM | POA: Insufficient documentation

## 2012-02-21 DIAGNOSIS — Z5189 Encounter for other specified aftercare: Secondary | ICD-10-CM | POA: Diagnosis not present

## 2012-02-21 NOTE — Evaluation (Cosign Needed)
Physical Therapy Evaluation  Patient Details  Name: Brendan Scott MRN: [id number] Date of Birth: 06/15/1945 Charges:  Eval; physical performance Today's Date: 02/21/2012 Time: 9629-5284 PT Time Calculation (min): 40 min  Visit#: 1  of 12   Re-eval: 03/22/12 Assessment Diagnosis: CVA Prior Therapy: CIR until 12/24; HH until 02/16/12  Authorization: BCBS/MEDICARE  Authorization Time Period:    Authorization Visit#: 1  of 10    Past Medical History:  Past Medical History  Diagnosis Date  . Arthritis   . Diabetes mellitus   . Atrial fibrillation    Past Surgical History:  Past Surgical History  Procedure Date  . Total knee arthroplasty     Bilateral    Subjective Symptoms/Limitations Symptoms: Brendan Scott states that he had a stroke on November 15th.  He states prior to the stroke he was not using a cane.  He is having trouble going from sit to stand; he is going up his steps one at a time.  But his main concern at this time is to get his left hand working better.   Pertinent History: B TKR How long can you stand comfortably?: Able to stand for five minutes.   How long can you walk comfortably?: The longest the patient has walked for since the stroke has been five minutes. 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 Leisure: Hobbies-yes (Comment) Comments: gardening; fishing  Cognition/Observation Cognition Overall Cognitive Status: Appears within functional limits for tasks assessed Arousal/Alertness: Awake/alert Orientation Level: Oriented X4 Awareness: Impaired Problem Solving:  Impaired  Sensation/Coordination/Flexibility/Functional Tests Functional Tests Functional Tests: sit to stand 30sec- 4x   Assessment  RLE Strength Right Hip Flexion: 4/5 Right Hip Extension: 3-/5 Right Hip ABduction: 4/5 Right Hip ADduction: 4/5 Right Knee Flexion:  (4-/5) Right Knee Extension: 5/5 Right Ankle Dorsiflexion: 5/5 LLE Strength Left Hip Flexion: 5/5 Left Hip Extension: 3-/5 Left Hip ABduction: 3/5 Left Hip ADduction: 4/5 Left Knee Flexion: 4/5 (4-/5) Left Knee Extension: 5/5 Left Ankle Dorsiflexion: 4/5  Exercise/Treatments Mobility/Balance  Berg Balance Test Sit to Stand: Able to stand  independently using hands Timed Up and Go Test TUG: Normal TUG Normal TUG (seconds): 19 98/100- anything above 13.8 is considered a high fall risk.   Exercises Seated Other Seated Knee Exercises: DF w/ 3# Supine Bridges: 10 reps Straight Leg Raises: 10 reps Sidelying Hip ABduction: Strengthening;10 reps Prone  Hamstring Curl: 10 reps    Physical Therapy Assessment and Plan PT Assessment and Plan Clinical Impression Statement: Pt s/p CVA who functional mobility has declined secondary to decreased strength and balance.  Pt will benefit from skilled care to return pt to maximum functional ability. Pt will benefit from skilled therapeutic intervention in order to improve on the following deficits: Decreased activity tolerance;Decreased balance;Decreased strength;Difficulty walking Rehab Potential: Good PT Frequency: Min 3X/week PT Duration: 4 weeks PT Treatment/Interventions: Gait training;Balance training;Therapeutic exercise;Therapeutic activities;Patient/family education PT Plan: Pt to begin TM; tandem, retro and side steping, cone rotation. SLS; L forward and lateral stepping;  Continue to progress working on balance and strengthening.    Goals Home Exercise Program Pt will Perform Home Exercise Program: Independently PT Goal: Perform Home Exercise Program -  Progress: Goal set today PT  Short Term Goals Time to Complete Short Term Goals: 2 weeks PT Short Term Goal 1: increases strength 1/2 grade to allow pt to come sit to stand on one try without using hands. PT Short Term Goal 2: Pt to state no falls in the past 2 weeks. PT Short Term Goal 3: Pt to be able to walk for ten minutes at a time PT Long Term Goals Time to Complete Long Term Goals: 4 weeks PT Long Term Goal 1: strength increased by one grade to allow pt to ambulate for 20 minutes at a time. PT Long Term Goal 2: Pt Berg to be increased 10 points to allow pt to be safe ambulating without an assistive device. Long Term Goal 3: Pt to be able to go up and down steps reciprocally Long Term Goal 4: Pt to be able to return to part-time work.  Problem List Patient Active Problem List  Diagnosis  . DIABETES  . OSTEOARTHRITIS, LOWER LEG  . OSTEOARTHRITIS, KNEE, LEFT  . JOINT EFFUSION, KNEE  . Pain in Joint, Lower Leg  . SCIATICA, RIGHT  . TOTAL KNEE FOLLOW-UP  . Pain, knee  . S/P total knee replacement  . Diabetes mellitus  . Focal motor seizure  . Embolic stroke involving middle cerebral artery  . Aspiration into respiratory tract  . Respiratory distress  . CVA (cerebral infarction)  . Muscle weakness (generalized)  . Lack of coordination    PT - End of Session Equipment Utilized During Treatment: Gait belt Activity Tolerance: Patient tolerated treatment well General Behavior During Session: [facility] for tasks performed Cognition: [facility] for tasks performed PT Plan of Care PT Home Exercise Plan: given  GP Functional Assessment Tool Used: LEFS / clinical judgement Functional Limitation: Mobility: Walking and moving around Mobility: Walking and Moving Around Current Status (Z6109): At least 60 percent but less than 80 percent impaired, limited or restricted Mobility: Walking and Moving Around Goal Status [phone]): At least 20 percent but less than 40 percent impaired, limited or  restricted  Brendan Scott,Brendan Scott 02/21/2012, 2:29 PM  Physician Documentation Your signature is required to indicate approval of the treatment plan as stated above.  Please sign and either send electronically or make a copy of this report for your files and return this physician signed original.   Please mark one 1.__approve of plan  2. ___approve of plan with the following conditions.   ______________________________                                                          _____________________ Physician Signature                                                                                                             Date

## 2012-02-21 NOTE — Evaluation (Signed)
Occupational Therapy Evaluation  Patient Details  Name: Brendan Scott MRN: [id number] Date of Birth: Aug 03, 1945  Today's Date: 02/21/2012 Time: 2952-8413 OT Time Calculation (min): 49 min OT eval 2440-1027 49'  Visit#: 1  of 36   Re-eval: 03/20/12  Assessment Diagnosis: R CVA Prior Therapy: CIR at [facility]  Authorization: medicare  Authorization Time Period: before 10th visit  Authorization Visit#: 1  of 10    Past Medical History:  Past Medical History  Diagnosis Date  . Arthritis   . Diabetes mellitus   . Atrial fibrillation    Past Surgical History:  Past Surgical History  Procedure Date  . Total knee arthroplasty     Bilateral    Subjective Symptoms/Limitations Symptoms: S: I just want to be able to use this left hand better. Pertinent History: [name] is a 67 y/o male s/p R CVA presenting to outpatient occupational therapy with left side weakness and decreased ADL performance. [name] experienced his stroke on December 15, 2011 and received rehab at [facility]. Patient is referred to occpational therapy for eval and treat. Limitations: Right hand grip strength, grabbing objects, opening and closing hand, pinching, doing zippers and buttons. Special Tests: UEFI: 16/80 = 16% Ind; 84% impaired Patient Stated Goals: To be able to use his left hand normally. 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 Leisure: Hobbies-yes (Comment) Comments: gardening; fishing  Assessment ADL/Vision/Perception ADL ADL Comments: Difficutly with buttons, zippers. Dominant Hand: Right Vision - History Baseline Vision: Wears glasses all the time Patient  Visual Report: No change from baseline  Cognition/Observation Cognition Overall Cognitive Status: Appears within functional limits for tasks assessed Arousal/Alertness: Awake/alert Orientation Level: Oriented X4 Awareness: Impaired Problem Solving: Impaired  Sensation/Coordination/Edema Sensation Light Touch: Appears Intact Stereognosis: Impaired by gross assessment Hot/Cold: Appears Intact Proprioception: Impaired by gross assessment Coordination Gross Motor Movements are Fluid and Coordinated: No Fine Motor Movements are Fluid and Coordinated: No Coordination and Movement Description:  (impaired gross and fine motor coordination; LUE) 9 Hole Peg Test: L: 4'31" R: 34" (Assist with holding pegs for patient to grab with left hand.)  Additional Assessments RUE AROM (degrees) Right Shoulder Flexion: 160 Degrees Right Shoulder ABduction: 140 Degrees RUE Strength Right Shoulder Flexion: 5/5 Right Shoulder ABduction: 5/5 Right Shoulder Horizontal ABduction: 5/5 Right Shoulder Horizontal ADduction: 5/5 Right Elbow Flexion: 5/5 Right Elbow Extension: 5/5 Grip (lbs): 115 Lateral Pinch: 28 lbs 3 Point Pinch: 18 lbs LUE Strength Left Shoulder Flexion: 3+/5 Left Shoulder ABduction: 3+/5 Left Shoulder Horizontal ABduction: 3+/5 Left Shoulder Horizontal ADduction: 3+/5 Left Elbow Flexion: 4/5 Left Elbow Extension: 3+/5 Grip (lbs): 25 Lateral Pinch: 4 lbs 3 Point Pinch: 2 lbs LUE Tone LUE Tone: Hypertonic;Moderate (fingers and hand) Hypertonic Details: Unable to extend fingers actively due to strong flexor tone Right Hand Strength - Pinch (lbs) Lateral Pinch: 28 lbs 3 Point Pinch: 18 lbs Left Hand Strength - Pinch (lbs) Lateral Pinch: 4 lbs 3 Point Pinch: 2 lbs    Occupational Therapy Assessment and Plan OT Assessment and Plan Clinical Impression Statement: A:  67 year old male with left upper extremity weakness and hemiplegia s/p cva.  His deficts including decreased left  upper extremity ROM, strength, coordination are causing decreased functional  I with all daily  activiites.   Pt will benefit from skilled therapeutic intervention in order to improve on the following deficits: Decreased coordination;Decreased range of motion;Decreased strength;Impaired UE functional use;Impaired perceived functional ability;Impaired flexibility Rehab Potential: Good OT Frequency: Min 3X/week OT Duration: 12 weeks OT Treatment/Interventions: Self-care/ADL training;Therapeutic exercise;Neuromuscular education;Therapeutic activities;Patient/family education OT Plan: P: Skilled OT intervention is indicated to improve AROM, strength, GMC, FMC, and functional use of LUE needed to return to highest level of I with daily activiites, lesiure, and work Animator.    Goals Home Exercise Program Pt will Perform Home Exercise Program: Independently PT Goal: Perform Home Exercise Program - Progress: Goal set today Short Term Goals Time to Complete Short Term Goals: 6 weeks Short Term Goal 1: Patient will be educated and perform HEP. Short Term Goal 2: Patient will increase AROM in his left shoulder to 75% for increased ablility to reach for objects when completing functional activiites.   Short Term Goal 3: Patient will increase left grip strength by 10 pounds and pich strength by 5 pounds for increased ability to open containers when fishing. Short Term Goal 4: Patient will complete Nine Hole Peg Test in standardized fashion. Short Term Goal 5: Patient will use his left hand as a gross assist with all daily activities. Long Term Goals Time to Complete Long Term Goals: 12 weeks Long Term Goal 1: Patient will return to highest level of independence with leisure and home activities. Long Term Goal 2: Patient will increase AROM in his left shoulder to 90% for increased ablility to reach for objects when completing functional activiites.   Long Term Goal 3: Patient will increase left grip  strength by 40 pounds and pich strength by 10  pounds for increased ability to open containers when fishing. Long Term Goal 4: Patient will improve fine motor coordination in order to complete Nine Hole Peg Test in 11' or less. Long Term Goal 5: Patient will use left hand to zip coat/jacket/pants without physical assistance.  Problem List Patient Active Problem List  Diagnosis  . DIABETES  . OSTEOARTHRITIS, LOWER LEG  . OSTEOARTHRITIS, KNEE, LEFT  . JOINT EFFUSION, KNEE  . Pain in Joint, Lower Leg  . SCIATICA, RIGHT  . TOTAL KNEE FOLLOW-UP  . Pain, knee  . S/P total knee replacement  . Diabetes mellitus  . Focal motor seizure  . Embolic stroke involving middle cerebral artery  . Aspiration into respiratory tract  . Respiratory distress  . CVA (cerebral infarction)  . Muscle weakness (generalized)  . Lack of coordination    End of Session Activity Tolerance: Patient tolerated treatment well General Behavior During Session: [facility] for tasks performed Cognition: [facility] for tasks performed OT Plan of Care OT Home Exercise Plan: theraputty and AROM exercises OT Patient Instructions: handout Consulted and Agree with Plan of Care: Patient;Family member/caregiver Family Member Consulted: wife and brother  GO Functional Assessment Tool Used: UEFI Functional Limitation: Carrying, moving and handling objects Carrying, Moving and Handling Objects Current Status [phone]): At least 80 percent but less than 100 percent impaired, limited or restricted Carrying, Moving and Handling Objects Goal Status [phone]): At least 20 percent but less than 40 percent impaired, limited or restricted  Limmie Patricia, OTR/L 02/21/2012, 12:33 PM  Physician Documentation Your signature is required to indicate approval of the treatment plan as stated above.  Please sign and either send electronically or make a copy of this report for your files and return this physician signed original.  Please mark one  1.__approve of plan  2. ___approve of plan with the following conditions.   ______________________________                                                          _____________________ Physician Signature                                                                                                             Date  

## 2012-02-22 ENCOUNTER — Ambulatory Visit (HOSPITAL_COMMUNITY): Payer: Medicare Other | Admitting: Specialist

## 2012-02-26 ENCOUNTER — Ambulatory Visit (HOSPITAL_COMMUNITY)
Admission: RE | Admit: 2012-02-26 | Discharge: 2012-02-26 | Disposition: A | Discharge status: Home / Self Care | Payer: BC Managed Care – PPO | Source: Ambulatory Visit | Attending: Family Medicine | Admitting: Family Medicine

## 2012-02-26 ENCOUNTER — Ambulatory Visit (HOSPITAL_COMMUNITY): Payer: BC Managed Care – PPO

## 2012-02-26 DIAGNOSIS — G3184 Mild cognitive impairment, so stated: Secondary | ICD-10-CM | POA: Diagnosis not present

## 2012-02-26 DIAGNOSIS — M6281 Muscle weakness (generalized): Secondary | ICD-10-CM | POA: Diagnosis not present

## 2012-02-26 DIAGNOSIS — I6990 Unspecified sequelae of unspecified cerebrovascular disease: Secondary | ICD-10-CM | POA: Diagnosis not present

## 2012-02-26 DIAGNOSIS — I69928 Other speech and language deficits following unspecified cerebrovascular disease: Secondary | ICD-10-CM | POA: Diagnosis not present

## 2012-02-26 DIAGNOSIS — Z5189 Encounter for other specified aftercare: Secondary | ICD-10-CM | POA: Diagnosis not present

## 2012-02-26 DIAGNOSIS — R279 Unspecified lack of coordination: Secondary | ICD-10-CM | POA: Diagnosis not present

## 2012-02-26 NOTE — Evaluation (Signed)
Speech Language Pathology Evaluation Patient Details  Name: Brendan Scott MRN: [id number] Date of Birth: 06/17/1945  Today's Date: 02/26/2012 Time: 4098-1191 SLP Time Calculation (min): 35 min  Authorization: Medicare  Authorization Time Period: 02/26/2012-2/24  Authorization Visit#:   of     Past Medical History:  Past Medical History  Diagnosis Date  . Arthritis   . Diabetes mellitus   . Atrial fibrillation    Past Surgical History:  Past Surgical History  Procedure Date  . Total knee arthroplasty     Bilateral   HPI:  Symptoms/Limitations Symptoms: "I want to work on my hand." Pain Assessment Currently in Pain?: No/denies  Prior Functional Status  Cognitive/Linguistic Baseline: Within functional limits Type of Home: House Lives With: Spouse;Family Available Help at Discharge: Family Education: 11th grade education Vocation: Part time employment  Cognition  Overall Cognitive Status: Impaired Arousal/Alertness: Awake/alert Orientation Level: Disoriented to time;Oriented to person;Oriented to place;Oriented to situation Attention: Sustained Sustained Attention: Impaired Sustained Attention Impairment: Verbal complex;Functional complex Memory: Impaired Memory Impairment: Retrieval deficit;Prospective memory;Decreased short term memory Decreased Short Term Memory: Verbal complex;Functional complex Awareness: Impaired Awareness Impairment: Emergent impairment Problem Solving: Impaired Problem Solving Impairment: Verbal complex;Functional complex Executive Function: Self Monitoring;Organizing Organizing: Impaired Organizing Impairment: Verbal complex;Functional complex Self Monitoring: Impaired Self Monitoring Impairment: Verbal complex;Functional complex Safety/Judgment: Impaired Comments: Wife is hiding the car keys because he thinks he is able to drive right now (left neglect).  Comprehension  Auditory Comprehension Overall Auditory Comprehension: Appears  within functional limits for tasks assessed Visual Recognition/Discrimination Discrimination: Within Function Limits Reading Comprehension Reading Status: Not tested (Did not read much at baseline)  Expression  Expression Primary Mode of Expression: Verbal Verbal Expression Overall Verbal Expression: Appears within functional limits for tasks assessed Initiation: No impairment Level of Generative/Spontaneous Verbalization: Conversation Repetition: No impairment Naming: No impairment Pragmatics: No impairment Interfering Components: Attention Non-Verbal Means of Communication: Not applicable Written Expression Dominant Hand: Right Written Expression:  (Question premorbid writing abilities, able to write name/add)  Oral/Motor  Oral Motor/Sensory Function Overall Oral Motor/Sensory Function: Impaired Labial ROM: Reduced left Labial Symmetry: Abnormal symmetry left Labial Strength: Reduced Lingual ROM: Reduced left Lingual Symmetry: Abnormal symmetry left Lingual Strength: Reduced Facial ROM: Reduced left Facial Symmetry: Left droop Facial Strength: Reduced Facial Sensation: Reduced Velum: Within Functional Limits Mandible: Within Functional Limits Motor Speech Overall Motor Speech: Impaired Respiration: Within functional limits Phonation: Normal Resonance: Within functional limits Articulation: Within functional limitis Intelligibility: Intelligibility reduced Conversation: 75-100% accurate Motor Planning: Witnin functional limits Motor Speech Errors: Not applicable Interfering Components: Inadequate dentition Effective Techniques: Slow rate;Over-articulate;Pause;Pacing  SLP Goals  Home Exercise SLP Goal: Patient will Perform Home Exercise Program: with supervision, verbal cues required/provided SLP Short Term Goals SLP Short Term Goal 1: Pt will use left attention strategies in functional tasks with mi/mod cues. SLP Short Term Goal 2: Pt will implement memory  strategies in functional tasks with mi/mod cues. SLP Short Term Goal 3: Pt will increase recall of functional information to 80% acc with use of compensatory strategies and mi/mod assist. SLP Short Term Goal 4: Pt will complete OMEs x10 each (labiolingual) with min cues. SLP Short Term Goal 5: Pt will implement speech intelligibility strategies in conversation with min cues for 100% intelligibility. SLP Long Term Goals SLP Long Term Goal 1: Pt will increase attention to left with independent use of strategies. SLP Long Term Goal 2: Pt will increase memory skills to [facility] in independent environment with use of strategies prn.  Assessment/Plan  Patient Active Problem List  Diagnosis  . DIABETES  . OSTEOARTHRITIS, LOWER LEG  . OSTEOARTHRITIS, KNEE, LEFT  . JOINT EFFUSION, KNEE  . Pain in Joint, Lower Leg  . SCIATICA, RIGHT  . TOTAL KNEE FOLLOW-UP  . Pain, knee  . S/P total knee replacement  . Diabetes mellitus  . Focal motor seizure  . Embolic Scott involving middle cerebral artery  . Aspiration into respiratory tract  . Respiratory distress  . CVA (cerebral infarction)  . Muscle weakness (generalized)  . Lack of coordination   SLP - End of Session Activity Tolerance: Patient tolerated treatment well General Behavior During Session: [facility] for tasks performed Cognition: [facility] for tasks performed  SLP Assessment/Plan  Clinical Impression: Brendan Scott presents with moderate cognitive linguistic deficits characterized by decreased sustained attention and decreased short term memory/recall which negatively impact safety in home/community. In addition, he presents with left facial droop which only minimally impacts speech intelligibility. Brendan Scott and would benefit from continued skilled cognitive-linguistic therapy in the outpatient setting to maximize recovery and independence in the home/community environment. He has excellent family  support and the pt appears motivate to work on residual deficits.   Speech Therapy Frequency: min 2x/week Duration: 4 weeks Treatment/Interventions: Compensatory techniques;SLP instruction and feedback;Patient/family education;Compensatory strategies;Functional tasks;Cueing hierarchy;Oral motor exercises;Internal/external aids;Cognitive reorganization Potential to Achieve Goals: Good  GN Functional Assessment Tool Used: informal cognitive-linguistic evaluation Functional Limitations: Memory Memory Current Status (Y7829): At least 40 percent but less than 60 percent impaired, limited or restricted Memory Goal Status (F6213): At least 1 percent but less than 20 percent impaired, limited or restricted  Decari Duggar 02/26/2012, 12:41 PM  Physician Documentation Your signature is required to indicate approval of the treatment plan as stated above.  Please sign and either send electronically or make a copy of this report for your files and return this physician signed original.  Please mark one 1.__approve of plan  2. ___approve of plan with the following conditions.   ______________________________                                                          _____________________ Physician Signature                                                                                                             Date

## 2012-02-26 NOTE — Progress Notes (Signed)
Occupational Therapy Treatment Patient Details  Name: Brendan Scott MRN: [id number] Date of Birth: 1946-01-24  Today's Date: 02/26/2012 Time: 6213-0865 OT Time Calculation (min): 47 min Therex [id number] 22' NM re-ed 1040-1105 25'  Visit#: 2  of 36   Re-eval: 03/20/12    Authorization: medicare  Authorization Time Period: before 10th visit  Authorization Visit#: 2  of 10   Subjective Symptoms/Limitations Symptoms: S: I've been doing my exercises for my hand at home. Pain Assessment Currently in Pain?: No/denies  Precautions/Restrictions   None   Exercise/Treatments Supine Flexion: AROM;10 reps Elbow Exercises Elbow Flexion: AROM;10 reps;Limitations Elbow Flexion Limitations: tactile cues to straighten elbow all the way during extension Wrist Flexion: AAROM;10 reps;Limitations Wrist Flexion Limitations: tacticle cues for technique. Wrist Extension: AROM;10 reps  Wrist Exercises Wrist Flexion: AAROM;10 reps;Limitations Wrist Flexion Limitations: tacticle cues for technique. Wrist Extension: AROM;10 reps  Hand Exercises MCPJ Flexion: PROM;10 reps MCPJ Extension: PROM;10 reps PIPJ Flexion: PROM;10 reps PIPJ Extension: PROM;10 reps DIPJ Flexion: PROM;10 reps DIPJ Extension: PROM;10 reps Digit Composite Abduction: PROM;10 reps Digit Composite Adduction: PROM;10 reps Opposition: PROM;10 reps Theraputty: Flatten;Grip;Locate Pegs Theraputty - Flatten: yellow Theraputty - Grip: yellow Theraputty - Locate Pegs: 6 small wooden Thumb Opposition: PROM x10 Digit Abduction/Adduction: x10     Weight Bearing Technique Weight Bearing Technique: Yes LUE Weight Bearing Technique: Extended arm standing Response to Weight Bearing Technique: verbal and physical cues for arm positioning. 5x5  Occupational Therapy Assessment and Plan OT Assessment and Plan Clinical Impression Statement: A: Left neglect very prominent this session during theraputty activity. Wife and brother present  during tx session .Wife over-cues patient during tx session.  OT Plan: P: Work on decreasing spaticity in DIP and PIP joints. Increase AROM of LUE. Incorporate increasing awareness to left side during tx sessions.   Goals Short Term Goals Time to Complete Short Term Goals: 6 weeks Short Term Goal 1: Patient will be educated and perform HEP. Short Term Goal 1 Progress: Progressing toward goal Short Term Goal 2: Patient will increase AROM in his left shoulder to 75% for increased ablility to reach for objects when completing functional activiites.   Short Term Goal 2 Progress: Progressing toward goal Short Term Goal 3: Patient will increase left grip strength by 10 pounds and pich strength by 5 pounds for increased ability to open containers when fishing. Short Term Goal 3 Progress: Progressing toward goal Short Term Goal 4: Patient will complete Nine Hole Peg Test in standardized fashion. Short Term Goal 4 Progress: Progressing toward goal Short Term Goal 5: Patient will use his left hand as a gross assist with all daily activities. Short Term Goal 5 Progress: Progressing toward goal Long Term Goals Time to Complete Long Term Goals: 12 weeks Long Term Goal 1: Patient will return to highest level of independence with leisure and home activities. Long Term Goal 1 Progress: Progressing toward goal Long Term Goal 2: Patient will increase AROM in his left shoulder to 90% for increased ablility to reach for objects when completing functional activiites.   Long Term Goal 2 Progress: Progressing toward goal Long Term Goal 3: Patient will increase left grip strength by 40 pounds and pich strength by 10  pounds for increased ability to open containers when fishing. Long Term Goal 3 Progress: Progressing toward goal Long Term Goal 4: Patient will improve fine motor coordination in order to complete Nine Hole Peg Test in 45' or less. Long Term Goal 4 Progress: Progressing toward goal Long Term Goal  5:  Patient will use left hand to zip coat/jacket/pants without physical assistance. Long Term Goal 5 Progress: Progressing toward goal  Problem List Patient Active Problem List  Diagnosis  . DIABETES  . OSTEOARTHRITIS, LOWER LEG  . OSTEOARTHRITIS, KNEE, LEFT  . JOINT EFFUSION, KNEE  . Pain in Joint, Lower Leg  . SCIATICA, RIGHT  . TOTAL KNEE FOLLOW-UP  . Pain, knee  . S/P total knee replacement  . Diabetes mellitus  . Focal motor seizure  . Embolic stroke involving middle cerebral artery  . Aspiration into respiratory tract  . Respiratory distress  . CVA (cerebral infarction)  . Muscle weakness (generalized)  . Lack of coordination    End of Session Activity Tolerance: Patient tolerated treatment well General Behavior During Session: [facility] for tasks performed Cognition: [facility] for tasks performed   Limmie Patricia, OTR/L 02/26/2012, 11:18 AM

## 2012-02-27 ENCOUNTER — Encounter: Payer: Self-pay | Admitting: *Deleted

## 2012-02-28 ENCOUNTER — Ambulatory Visit (HOSPITAL_COMMUNITY): Payer: BC Managed Care – PPO

## 2012-02-28 ENCOUNTER — Ambulatory Visit (HOSPITAL_COMMUNITY)
Admission: RE | Admit: 2012-02-28 | Discharge: 2012-02-28 | Disposition: A | Discharge status: Home / Self Care | Payer: BC Managed Care – PPO | Source: Ambulatory Visit | Attending: Family Medicine | Admitting: Family Medicine

## 2012-02-28 DIAGNOSIS — I6990 Unspecified sequelae of unspecified cerebrovascular disease: Secondary | ICD-10-CM | POA: Diagnosis not present

## 2012-02-28 DIAGNOSIS — Z5189 Encounter for other specified aftercare: Secondary | ICD-10-CM | POA: Diagnosis not present

## 2012-02-28 DIAGNOSIS — M6281 Muscle weakness (generalized): Secondary | ICD-10-CM | POA: Diagnosis not present

## 2012-02-28 DIAGNOSIS — I69928 Other speech and language deficits following unspecified cerebrovascular disease: Secondary | ICD-10-CM | POA: Diagnosis not present

## 2012-02-28 DIAGNOSIS — G3184 Mild cognitive impairment, so stated: Secondary | ICD-10-CM | POA: Diagnosis not present

## 2012-02-28 DIAGNOSIS — R279 Unspecified lack of coordination: Secondary | ICD-10-CM | POA: Diagnosis not present

## 2012-02-28 NOTE — Progress Notes (Signed)
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  Pain Assessment Currently in Pain?: No/denies Pain Score: 0-No pain  Precautions/Restrictions  Precautions Precautions: None  Exercise/Treatments Neurological Re-education Exercises Shoulder Flexion: Supine;AROM;10 reps Shoulder ABduction: Supine;AROM;10 reps Shoulder Protraction: Supine;AROM;10 reps Shoulder Horizontal ABduction: Supine;AROM;10 reps Shoulder External Rotation: Supine;AROM;10 reps Shoulder Internal Rotation: Supine;AROM;10 reps Elbow Flexion: Supine;AROM;10 reps Elbow Extension: Supine;AROM;10 reps Forearm Supination: Supine;AROM;10 reps Forearm Pronation: Supine;AROM;10 reps Wrist Flexion: Supine;AROM;10 reps Wrist Extension: Supine;AROM;10 reps  Weight Bearing Exercises Weight Bearing Position: Seated Seated with weight on hand: sat edge of mat, weightbearing on LUE while moving Saebo balls from mat on his right side to crate on floor positioned to his left.  He is able to weightbear on his LUE with occasional vg and tactile cues to maintain proper positioning.    Development of Reach  Development of Reach: Reaching Reaching to Shoulder Height: supine reaching into various postions/degrees of shoulder flexion, abduction, ext rotation, with vg to use full elbow extension.  Sitting at table patient picked up small and large rifle shells and reached into flexion or abduction to hand shells to therapist  Grasp and Release Grasp and Release:  (with Saebo balls, sponges, rifle shells.  )  Fine Motor  Coordination In Hand Manipulation Training: attempted with rifle cases unable to complete.  Was able to complete with sponges, able to hold 5 sponges at a time before dropping them.         Occupational Therapy Assessment and Plan OT Assessment and Plan Clinical Impression Statement: A:  Greater attention to left side this date.  Required mod vg initially to complete full range of each exercise in supine, and was able to complete more Ily the more reps we did.  Patient able to maintain grasp on various sized objects through entire reaching activity.  OT Plan: P:  Increase grip strength and ability to grasp and fully release larger objects Ily during functional activities.    Goals Short Term Goals Time to Complete Short Term Goals: 6 weeks Short Term Goal 1: Patient will be educated and perform HEP. Short Term Goal 2: Patient will increase AROM in his left shoulder to 75% for increased ablility to reach for objects when completing functional activiites.   Short Term Goal 3: Patient will increase left grip strength by 10 pounds and pich strength by 5 pounds for increased ability to open containers when fishing. Short Term Goal 4: Patient will complete Nine Hole Peg Test in standardized fashion. Short Term Goal 5: Patient will use his left hand as a gross assist with all daily activities. Long Term Goals Time to Complete Long Term Goals: 12 weeks Long Term Goal 1: Patient will return to highest level of independence with leisure and home activities. Long Term Goal 2: Patient will increase AROM in his left shoulder to 90% for increased ablility to reach for objects when completing functional activiites.   Long Term Goal 3: Patient will increase left grip strength by 40 pounds and pich strength by 10  pounds for  increased ability to open containers when fishing. Long Term Goal 4: Patient will improve fine motor coordination in order to complete Nine Hole Peg Test in 57' or less. Long Term Goal 5:  Patient will use left hand to zip coat/jacket/pants without physical assistance.  Problem List Patient Active Problem List  Diagnosis  . DIABETES  . OSTEOARTHRITIS, LOWER LEG  . OSTEOARTHRITIS, KNEE, LEFT  . JOINT EFFUSION, KNEE  . Pain in Joint, Lower Leg  . SCIATICA, RIGHT  . TOTAL KNEE FOLLOW-UP  . Pain, knee  . S/P total knee replacement  . Diabetes mellitus  . Focal motor seizure  . Embolic stroke involving middle cerebral artery  . Aspiration into respiratory tract  . Respiratory distress  . CVA (cerebral infarction)  . Muscle weakness (generalized)  . Lack of coordination    End of Session Activity Tolerance: Patient tolerated treatment well General Behavior During Session: [facility] for tasks performed Cognition: [facility] for tasks performed  Shirlean Mylar, OTR/L  02/28/2012, 12:04 PM

## 2012-02-28 NOTE — Progress Notes (Signed)
Physical Therapy Treatment Patient Details  Name: Brendan Scott MRN: [id number] Date of Birth: [birth year]  Today's Date: 02/28/2012 Time: 1025-1108 PT Time Calculation (min): 43 min  Visit#: 2  of 12   Re-eval: 03/22/12 Assessment Diagnosis: CVA Prior Therapy: CIR until 12/24; HH until 02/16/12 Charge: Gait 8', NMR 23', therex 12'  Authorization: BCBS/ Medicare  Authorization Time Period:    Authorization Visit#: 2  of 10    Subjective: Symptoms/Limitations Symptoms: Pt pain free today.  Pt stated compliance with HEP, has been working on sit to stand without hands from his bed as well. Pain Assessment Currently in Pain?: No/denies  Precautions/Restrictions  Precautions Precautions: None  Exercise/Treatments Aerobic Tread Mill: gait training @ .45 cyc/sec LL 88 in x 8' Standing Heel Raises: 15 reps;Limitations Heel Raises Limitations: toe raises Lateral Step Up: Left;10 reps;Hand Hold: 2;Step Height: 4" Forward Step Up: Left;15 reps;Hand Hold: 1;Step Height: 4" SLS: R 11", L 10" Other Standing Knee Exercises: NMR: tandem stance 3x 30", tandem, retro and side stepping 2RT, cone rotation on foam 1RT BUE Other Standing Knee Exercises: high march and hold 10x 5", hip extend BLE 10x, abduction 10x  Seated Other Seated Knee Exercises: STS without HHA min A 3x     Physical Therapy Assessment and Plan PT Assessment and Plan Clinical Impression Statement: Began OPPT treatment to improve balance, increased strength and improve gait mechanics per PT POC.  Pt with min A required for LOB episodes with balance activities and vc-ing to improve spatial awareness with less assistance required following cueing.  Began gait training on TM to improve gait mechanics with cueing for heel to toe, increase/equalize stride length and improve posture.  Pt limtied by fatigue at end of session.   PT Plan: Continue with current POC to improve gait mechanics, balance and strengthening.  Begin rocker  board next session to improve weight shifting    Goals    Problem List Patient Active Problem List  Diagnosis  . DIABETES  . OSTEOARTHRITIS, LOWER LEG  . OSTEOARTHRITIS, KNEE, LEFT  . JOINT EFFUSION, KNEE  . Pain in Joint, Lower Leg  . SCIATICA, RIGHT  . TOTAL KNEE FOLLOW-UP  . Pain, knee  . S/P total knee replacement  . Diabetes mellitus  . Focal motor seizure  . Embolic stroke involving middle cerebral artery  . Aspiration into respiratory tract  . Respiratory distress  . CVA (cerebral infarction)  . Muscle weakness (generalized)  . Lack of coordination    PT - End of Session Equipment Utilized During Treatment: Gait belt Activity Tolerance: Patient tolerated treatment well;Patient limited by fatigue General Behavior During Session: [facility] for tasks performed Cognition: [facility] for tasks performed  GP    Juel Burrow 02/28/2012, 11:21 AM

## 2012-02-29 ENCOUNTER — Ambulatory Visit (INDEPENDENT_AMBULATORY_CARE_PROVIDER_SITE_OTHER): Payer: Medicare Other | Admitting: Cardiovascular Disease

## 2012-02-29 ENCOUNTER — Encounter: Payer: Self-pay | Admitting: Cardiovascular Disease

## 2012-02-29 VITALS — BP 142/80 | HR 72 | Ht 66.0 in | Wt 189.0 lb

## 2012-02-29 DIAGNOSIS — I4891 Unspecified atrial fibrillation: Secondary | ICD-10-CM | POA: Diagnosis not present

## 2012-02-29 DIAGNOSIS — E119 Type 2 diabetes mellitus without complications: Secondary | ICD-10-CM

## 2012-02-29 DIAGNOSIS — I6330 Cerebral infarction due to thrombosis of unspecified cerebral artery: Secondary | ICD-10-CM

## 2012-02-29 DIAGNOSIS — I63419 Cerebral infarction due to embolism of unspecified middle cerebral artery: Secondary | ICD-10-CM

## 2012-02-29 MED ORDER — CARVEDILOL 3.125 MG PO TABS: 3.1250 mg | ORAL_TABLET | Freq: Two times a day (BID) | ORAL | Status: DC

## 2012-02-29 NOTE — Assessment & Plan Note (Signed)
From afib with CHAD2 score 4.  If possible should be switched to xarelto or eliquis.  Will have nurse look into patient assistance His INR has not been adequatley controlled and he is relatively well and would do well with novel agent

## 2012-02-29 NOTE — Progress Notes (Signed)
Patient ID: Brendan Scott, male   DOB: [birth year], 67 y.o.   MRN: [id number] 67 yo referred for long term f/u of afib with anticoagulation.  Recent large MCA stroke 11/13 given TPA and long hospitall/rehab staty.  Chronic afib but prior to hospitalization only on aspirin. Has DM , HTN and elevated lipids.  CHADS2 score is 4 post CVA.  Echo done in hospital reviewed and normal other than Moderate LAE and no SOE seen.  Coumadin hard to regulate  Wife says it is always too high or too low.  Followed at [facility] office.  Not on any rate controlling drugs.  Still with left side weakness from stroke.     ROS: Denies fever, malais, weight loss, blurry vision, decreased visual acuity, cough, sputum, SOB, hemoptysis, pleuritic pain, palpitaitons, heartburn, abdominal pain, melena, lower extremity edema, claudication, or rash.  All other systems reviewed and negative   General: Affect appropriate Black male  HEENT: normal Neck supple with no adenopathy JVP normal no bruits no thyromegaly Lungs clear with no wheezing and good diaphragmatic motion Heart:  S1/S2 no murmur,rub, gallop or click PMI normal Abdomen: benighn, BS positve, no tenderness, no AAA no bruit.  No HSM or HJR Distal pulses intact with no bruits No edema Neuro left sided weakness from stroke especially LUE Skin warm and dry No muscular weakness  Medications Current Outpatient Prescriptions  Medication Sig Dispense Refill  . clonazePAM (KLONOPIN) 0.5 MG tablet Take 1 tablet (0.5 mg total) by mouth at bedtime.  30 tablet  1  . glimepiride (AMARYL) 2 MG tablet Take 2 mg by mouth daily before breakfast.       . Lacosamide (VIMPAT) 150 MG TABS Take 300 mg by mouth 2 (two) times daily.      Marland Kitchen levETIRAcetam (KEPPRA) 500 MG tablet Take 500 mg by mouth daily.      Marland Kitchen lisinopril (PRINIVIL,ZESTRIL) 5 MG tablet Take 1 tablet (5 mg total) by mouth daily.  30 tablet  1  . simvastatin (ZOCOR) 20 MG tablet Take 1 tablet (20 mg total) by mouth  daily at 6 PM.  30 tablet  1  . warfarin (COUMADIN) 5 MG tablet Take 1 tablet (5 mg total) by mouth daily at 6 PM.  30 tablet  1    Allergies Review of patient's allergies indicates no known allergies.  Family History: Family History  Problem Relation Age of Onset  . Arthritis    . Diabetes      Social History: History   Social History  . Marital Status: Married    Spouse Name: N/A    Number of Children: N/A  . Years of Education: N/A   Occupational History  . Not on file.   Social History Main Topics  . Smoking status: Former Games developer  . Smokeless tobacco: Not on file     Comment: quit 30 years ago  . Alcohol Use: No  . Drug Use: No  . Sexually Active: Not on file   Other Topics Concern  . Not on file   Social History Narrative  . No narrative on file    Electrocardiogram:  11/13  afib otherwise normal  Assessment and Plan

## 2012-02-29 NOTE — Assessment & Plan Note (Signed)
Discussed low carb diet.  Target hemoglobin A1c is 6.5 or less.  Continue current medications.  

## 2012-02-29 NOTE — Assessment & Plan Note (Signed)
In office HR over 100 with any kind of walking/activity  Start coreg for rate control  See above regarding anticoagulation Echo with normal EF and no other structural heart disease

## 2012-02-29 NOTE — Patient Instructions (Signed)
Your physician recommends that you schedule a follow-up appointment in: 6 months  Your physician has recommended you make the following change in your medication:  1 - START Coreg 3.125 mg twice a day

## 2012-03-01 ENCOUNTER — Ambulatory Visit (HOSPITAL_COMMUNITY)
Admission: RE | Admit: 2012-03-01 | Discharge: 2012-03-01 | Disposition: A | Discharge status: Home / Self Care | Payer: BC Managed Care – PPO | Source: Ambulatory Visit | Attending: Family Medicine | Admitting: Family Medicine

## 2012-03-01 ENCOUNTER — Ambulatory Visit (HOSPITAL_COMMUNITY): Payer: BC Managed Care – PPO

## 2012-03-01 DIAGNOSIS — I6990 Unspecified sequelae of unspecified cerebrovascular disease: Secondary | ICD-10-CM | POA: Diagnosis not present

## 2012-03-01 DIAGNOSIS — I69928 Other speech and language deficits following unspecified cerebrovascular disease: Secondary | ICD-10-CM | POA: Diagnosis not present

## 2012-03-01 DIAGNOSIS — G3184 Mild cognitive impairment, so stated: Secondary | ICD-10-CM | POA: Diagnosis not present

## 2012-03-01 DIAGNOSIS — M6281 Muscle weakness (generalized): Secondary | ICD-10-CM | POA: Diagnosis not present

## 2012-03-01 DIAGNOSIS — Z5189 Encounter for other specified aftercare: Secondary | ICD-10-CM | POA: Diagnosis not present

## 2012-03-01 DIAGNOSIS — R279 Unspecified lack of coordination: Secondary | ICD-10-CM | POA: Diagnosis not present

## 2012-03-01 NOTE — Progress Notes (Addendum)
Occupational Therapy Treatment Patient Details  Name: Brendan Scott MRN: [id number] Date of Birth: 07/29/1945  Today's Date: 03/01/2012 Time: 7829-5621 OT Time Calculation (min): 44 min NM re-ed [phone] 44' Visit#: 4  of 36   Re-eval: 03/20/12    Authorization: medicare  Authorization Time Period: before 10th visit   Authorization Visit#: 4  of 10   Subjective Pain Assessment Currently in Pain?: No/denies  Precautions/Restrictions   None  Exercise/Treatments Hand Exercises MCPJ Flexion: AROM;10 reps MCPJ Extension: AROM;10 reps Large Pegboard: placed 10 pegs with left hand; physical cues to depress shoulder. Also provided physical assist/cues to keep elbow on table during activity to increase supination/pronation and wrist mobility.   Neurological Re-education Exercises Shoulder Flexion: Supine;AROM;10 reps Shoulder ABduction: Supine;AROM;10 reps;Limitations Shoulder Abduction Limitations: tactile cues for technique Shoulder Protraction: Supine;AROM;10 reps Shoulder Horizontal ABduction: Supine;AROM;10 reps Shoulder External Rotation: Supine;AROM;10 reps Shoulder Internal Rotation: Supine;AROM;10 reps Elbow Flexion: Supine;AROM;10 reps Elbow Extension: Supine;AROM;10 reps Forearm Supination: Supine;AROM;10 reps Forearm Pronation: Supine;AROM;10 reps Wrist Flexion: Supine;AROM;10 reps Wrist Extension: Supine;AROM;10 reps    Development of Reach  Development of Reach: Reaching;Saebo Reaching to Shoulder Height: supine reaching into various postions/degrees of shoulder flexion, abduction, ext rotation, with vg to use full elbow extension.   Saebo Ring Tree: performed standing; worked on keeping shoulder depressed and elbow adducted while reaching to place and remove Saebo balls Saebo Supination: performed standing with vc's to depress shoulder and adducted Saebo Pronation: performed standing with vc's to depress shoulder and adduct elbow   Fine Motor Coordination Large  Pegboard: placed 10 pegs with left hand; physical cues to depress shoulder. Also provided physical assist/cues to keep elbow on table during activity to increase supination/pronation and wrist mobility.         Occupational Therapy Assessment and Plan OT Assessment and Plan Clinical Impression Statement: A; Needed min-mod vc's for technique during exercises. Was challenged during functional tasks to depress shoulder and adduct elbow.  OT Plan: P:  Increase grip strength and ability to grasp and fully release larger objects during functional activities.    Goals Short Term Goals Time to Complete Short Term Goals: 6 weeks Short Term Goal 1: Patient will be educated and perform HEP. Short Term Goal 2: Patient will increase AROM in his left shoulder to 75% for increased ablility to reach for objects when completing functional activiites.   Short Term Goal 3: Patient will increase left grip strength by 10 pounds and pich strength by 5 pounds for increased ability to open containers when fishing. Short Term Goal 4: Patient will complete Nine Hole Peg Test in standardized fashion. Short Term Goal 5: Patient will use his left hand as a gross assist with all daily activities. Long Term Goals Time to Complete Long Term Goals: 12 weeks Long Term Goal 1: Patient will return to highest level of independence with leisure and home activities. Long Term Goal 2: Patient will increase AROM in his left shoulder to 90% for increased ablility to reach for objects when completing functional activiites.   Long Term Goal 3: Patient will increase left grip strength by 40 pounds and pich strength by 10  pounds for increased ability to open containers when fishing. Long Term Goal 4: Patient will improve fine motor coordination in order to complete Nine Hole Peg Test in 68' or less. Long Term Goal 5: Patient will use left hand to zip coat/jacket/pants without physical assistance.  Problem List Patient Active Problem  List  Diagnosis  . DIABETES  .  OSTEOARTHRITIS, LOWER LEG  . OSTEOARTHRITIS, KNEE, LEFT  . JOINT EFFUSION, KNEE  . Pain in Joint, Lower Leg  . SCIATICA, RIGHT  . TOTAL KNEE FOLLOW-UP  . Pain, knee  . S/P total knee replacement  . Diabetes mellitus  . Focal motor seizure  . Embolic stroke involving middle cerebral artery  . Aspiration into respiratory tract  . Respiratory distress  . CVA (cerebral infarction)  . Muscle weakness (generalized)  . Lack of coordination  . A-fib    End of Session Activity Tolerance: Patient tolerated treatment well General Behavior During Session: [facility] for tasks performed Cognition: [facility] for tasks performed   Limmie Patricia, OTR/L 03/01/2012, 11:00 AM

## 2012-03-01 NOTE — Progress Notes (Signed)
Physical Therapy Treatment Patient Details  Name: Brendan Scott MRN: [id number] Date of Birth: 09-14-1945  Today's Date: 03/01/2012 Time: 1016-1100 PT Time Calculation (min): 44 min  Visit#: 3  of 12   Re-eval: 03/22/12  Charge: Gait 8', NMR 23', TE 12'  Authorization: BCBS/ Medicare  Authorization Time Period:    Authorization Visit#: 3  of 10    Subjective: Symptoms/Limitations Symptoms: Pt pain free today.  No complaints Pain Assessment Currently in Pain?: No/denies  Precautions/Restrictions     Exercise/Treatments Aerobic Tread Mill: gait training @ .50 cyc/sec LL 88 in x 8',  vc-ing to advance feet forward, posture, heel to toe gait, and LLE knee flexion Standing Heel Raises: 15 reps;Limitations Heel Raises Limitations: toe raises Knee Flexion: Both;15 reps Lateral Step Up: Left;15 reps;Hand Hold: 1;Step Height: 4";Limitations;Step Height: 2" Lateral Step Up Limitations: 2in hip hike BLE x 10 Forward Step Up: Left;15 reps;Hand Hold: 1;Step Height: 4" Rocker Board: 2 minutes;Limitations Rocker Board Limitations: R/L SLS: R and L 14" max of 3 Other Standing Knee Exercises: NMR: tandem stance 3x 30", tandem, retro and side stepping 2RT, cone rotation on foam 1RT BUE Other Standing Knee Exercises: high march and hold 10x 5", hip extend BLE 10x, abduction 10x     Physical Therapy Assessment and Plan PT Assessment and Plan Clinical Impression Statement: Began rockerboard to improve weight shifting to improve gait mechanics.  Pt. required multimodal cueing with gait training this session to normalize gait mechanics.  Pt improved activity tolerance, not as fatigued at end of session. PT Plan: Continue with current POC to improve gait mechanics, balance and strengthening    Goals    Problem List Patient Active Problem List  Diagnosis  . DIABETES  . OSTEOARTHRITIS, LOWER LEG  . OSTEOARTHRITIS, KNEE, LEFT  . JOINT EFFUSION, KNEE  . Pain in Joint, Lower Leg  .  SCIATICA, RIGHT  . TOTAL KNEE FOLLOW-UP  . Pain, knee  . S/P total knee replacement  . Diabetes mellitus  . Focal motor seizure  . Embolic stroke involving middle cerebral artery  . Aspiration into respiratory tract  . Respiratory distress  . CVA (cerebral infarction)  . Muscle weakness (generalized)  . Lack of coordination  . A-fib    PT - End of Session Equipment Utilized During Treatment: Gait belt Activity Tolerance: Patient tolerated treatment well General Behavior During Session: [facility] for tasks performed Cognition: [facility] for tasks performed  GP    Juel Burrow 03/01/2012, 12:01 PM

## 2012-03-04 ENCOUNTER — Ambulatory Visit (HOSPITAL_COMMUNITY): Payer: BC Managed Care – PPO | Admitting: Speech Pathology

## 2012-03-04 ENCOUNTER — Ambulatory Visit (HOSPITAL_COMMUNITY)
Admission: RE | Admit: 2012-03-04 | Discharge: 2012-03-04 | Disposition: A | Discharge status: Home / Self Care | Payer: Medicare Other | Source: Ambulatory Visit | Attending: Family Medicine | Admitting: Family Medicine

## 2012-03-04 DIAGNOSIS — M6281 Muscle weakness (generalized): Secondary | ICD-10-CM | POA: Insufficient documentation

## 2012-03-04 DIAGNOSIS — I69928 Other speech and language deficits following unspecified cerebrovascular disease: Secondary | ICD-10-CM | POA: Insufficient documentation

## 2012-03-04 DIAGNOSIS — R279 Unspecified lack of coordination: Secondary | ICD-10-CM | POA: Insufficient documentation

## 2012-03-04 DIAGNOSIS — I6990 Unspecified sequelae of unspecified cerebrovascular disease: Secondary | ICD-10-CM | POA: Insufficient documentation

## 2012-03-04 DIAGNOSIS — G3184 Mild cognitive impairment, so stated: Secondary | ICD-10-CM | POA: Insufficient documentation

## 2012-03-04 DIAGNOSIS — Z5189 Encounter for other specified aftercare: Secondary | ICD-10-CM | POA: Insufficient documentation

## 2012-03-04 DIAGNOSIS — E119 Type 2 diabetes mellitus without complications: Secondary | ICD-10-CM | POA: Insufficient documentation

## 2012-03-04 NOTE — Progress Notes (Signed)
Physical Therapy Treatment Patient Details  Name: Brendan Scott MRN: [id number] Date of Birth: [birth year]  Today's Date: 03/04/2012 Time: 1020-1100 PT Time Calculation (min): 40 min Charges: NMR x30', TE x10 Visit#: 4  of 12   Re-eval: 03/22/12    Authorization: BCBS/ Medicare  Authorization Time Period:    Authorization Visit#: 4  of 10    Subjective: Symptoms/Limitations Symptoms: Pt reports he did not do his exercises much this weekend.  States he is walking about 1/4 mile in 10 minutes.  Pain Assessment Currently in Pain?: No/denies  Precautions/Restrictions  Precautions Precautions: Fall  Exercise/Treatments Aerobic Tread Mill: gait training @ .44 cyc/sec LL 88 in x 6',  vc-ing to advance feet forward, posture, heel to toe gait, and LLE knee flexion Standing Heel Raises: 15 reps;Limitations Heel Raises Limitations: no HHA; toe raises 15 reps no HHA Knee Flexion: Both;15 reps;Limitations Knee Flexion Limitations: 2# Wall Squat: 10 reps Rocker Board: 3 minutes Rocker Board Limitations:  (R/L - required 1 min to ask pt to step off to lack of focus) SLS: R and L 3x30 sec w/intermitted HHA Other Standing Knee Exercises: Tandem stance 1x30 sec independent BLE Tandem Gait: Forward;2 reps Retro Gait: 2 reps Sidestepping: 2 reps Seated Heel Slides: 10 reps (L knee) Sit to Stand: x5 w/o UE A and CGA   Physical Therapy Assessment and Plan PT Assessment and Plan Clinical Impression Statement: Pt able to go from STS w/o UE A, is now ambulating 10 minutes w/supervision with decreased speed.  He continues to improve his overall strength.  Has greatest limitations secondary to L knee flexion to 85 degrees and has impaired focus by end of treatment especially when wife was present.  PT Plan: Continue with current POC to improve gait mechanics, balance and strengthening    Goals Home Exercise Program Pt will Perform Home Exercise Program: Independently PT Goal: Perform Home  Exercise Program - Progress: Progressing toward goal PT Short Term Goals PT Short Term Goal 1: increases strength 1/2 grade to allow pt to come sit to stand on one try without using hands. PT Short Term Goal 1 - Progress: Partly met (able to go STS w/o UE A) PT Short Term Goal 2: Pt to state no falls in the past 2 weeks. PT Short Term Goal 2 - Progress: Progressing toward goal PT Short Term Goal 3: Pt to be able to walk for ten minutes at a time PT Short Term Goal 3 - Progress: Met (pt report) PT Long Term Goals Time to Complete Long Term Goals: 4 weeks PT Long Term Goal 1: strength increased by one grade to allow pt to ambulate for 20 minutes at a time. PT Long Term Goal 1 - Progress: Progressing toward goal PT Long Term Goal 2: Pt Berg to be increased 10 points to allow pt to be safe ambulating without an assistive device. PT Long Term Goal 2 - Progress: Progressing toward goal Long Term Goal 3: Pt to be able to go up and down steps reciprocally Long Term Goal 4: Pt to be able to return to part-time work.  Problem List Patient Active Problem List  Diagnosis  . DIABETES  . OSTEOARTHRITIS, LOWER LEG  . OSTEOARTHRITIS, KNEE, LEFT  . JOINT EFFUSION, KNEE  . Pain in Joint, Lower Leg  . SCIATICA, RIGHT  . TOTAL KNEE FOLLOW-UP  . Pain, knee  . S/P total knee replacement  . Diabetes mellitus  . Focal motor seizure  . Embolic stroke involving middle  cerebral artery  . Aspiration into respiratory tract  . Respiratory distress  . CVA (cerebral infarction)  . Muscle weakness (generalized)  . Lack of coordination  . A-fib   PT - End of Session Equipment Utilized During Treatment: Gait belt Activity Tolerance: Patient tolerated treatment well General Behavior During Session: [facility] for tasks performed Cognition: [facility] for tasks performed  Laurenashley Viar, PT 03/04/2012, 11:04 AM

## 2012-03-04 NOTE — Progress Notes (Signed)
Occupational Therapy Treatment Patient Details  Name: Brendan Scott MRN: [id number] Date of Birth: 21-Oct-1945  Today's Date: 03/04/2012 Time: 8413-2440 OT Time Calculation (min): 35 min Neuro reeducation 35' Visit#: 5  of 36   Re-eval: 03/20/12    Authorization: medicare  Authorization Time Period: before 10th visit   Authorization Visit#: 5  of 10   Subjective S:  Did you get to check out the buffer?  (OTR/L is planning on examining the buffer used at his place of employment) Pain Assessment Currently in Pain?: No/denies Pain Score: 0-No pain  Precautions/Restrictions  Precautions Precautions: Fall  Exercise/Treatments Neurological Re-education Exercises Shoulder Flexion: Supine;AROM;10 reps Shoulder ABduction: Supine;AROM;10 reps Shoulder Protraction: Supine;AROM;10 reps Shoulder Horizontal ABduction: Supine;AROM;10 reps Shoulder External Rotation: Supine;AROM;10 reps Shoulder Internal Rotation: Supine;AROM;10 reps Elbow Flexion: Supine;AROM;10 reps Elbow Extension: Supine;AROM;10 reps Forearm Supination: Supine;AROM;10 reps Forearm Pronation: Supine;AROM;10 reps Wrist Flexion: Supine;AROM;10 reps Wrist Extension: Supine;AROM;10 reps Wrist Radial Deviation: Supine;AROM;10 reps Wrist Ulnar Deviation: Supine;AROM;10 reps  Weight Bearing Exercises Weight Bearing Position: Seated Seated with weight on hand: sat edge of mat weightbearing with min facilitation at hand to extend digits while moving small foam blocks to his left side with his right hand.  Increased digit extension noted after weightbearing.    Development of Reach  Development of Reach: Reaching Reaching to Waist: seated at table patient able to use his left hand at table height to reach for dominoes and play dominoes with minimal vg to depress shoulder blade.  Reaching to Shoulder Height: supine reaching into various postions/degrees of shoulder flexion, abduction, ext rotation, with vg to use full elbow  extension.     Fine Motor Coordination In Hand Manipulation Training: Patient removed dominoes from box, flipped them over and stood them up with his left hand.  He then played dominoes with therapist using his left hand.  He had minimal difficulty with flipping the dominoes over, and was able to pick them up and lay them on gametable without difficulty        Occupational Therapy Assessment and Plan OT Assessment and Plan Clinical Impression Statement: A:  Patient able to maintain extended digits with 50% accuracy after being placed in this position by this therapist.  Patient would benefit from a night splint to promote PIPJ extension. OT Plan: P:  Fabricate night digit extension splint.     Goals Home Exercise Program Pt will Perform Home Exercise Program: Independently PT Goal: Perform Home Exercise Program - Progress: Progressing toward goal Short Term Goals Time to Complete Short Term Goals: 6 weeks Short Term Goal 1: Patient will be educated and perform HEP. Short Term Goal 1 Progress: Progressing toward goal Short Term Goal 2: Patient will increase AROM in his left shoulder to 75% for increased ablility to reach for objects when completing functional activiites.   Short Term Goal 2 Progress: Progressing toward goal Short Term Goal 3: Patient will increase left grip strength by 10 pounds and pich strength by 5 pounds for increased ability to open containers when fishing. Short Term Goal 3 Progress: Progressing toward goal Short Term Goal 4: Patient will complete Nine Hole Peg Test in standardized fashion. Short Term Goal 4 Progress: Progressing toward goal Short Term Goal 5: Patient will use his left hand as a gross assist with all daily activities. Short Term Goal 5 Progress: Progressing toward goal Long Term Goals Time to Complete Long Term Goals: 12 weeks Long Term Goal 1: Patient will return to highest level of independence with  leisure and home activities. Long Term Goal 1  Progress: Progressing toward goal Long Term Goal 2: Patient will increase AROM in his left shoulder to 90% for increased ablility to reach for objects when completing functional activiites.   Long Term Goal 2 Progress: Progressing toward goal Long Term Goal 3: Patient will increase left grip strength by 40 pounds and pich strength by 10  pounds for increased ability to open containers when fishing. Long Term Goal 3 Progress: Progressing toward goal Long Term Goal 4: Patient will improve fine motor coordination in order to complete Nine Hole Peg Test in 45' or less. Long Term Goal 4 Progress: Progressing toward goal Long Term Goal 5: Patient will use left hand to zip coat/jacket/pants without physical assistance. Long Term Goal 5 Progress: Progressing toward goal  Problem List Patient Active Problem List  Diagnosis  . DIABETES  . OSTEOARTHRITIS, LOWER LEG  . OSTEOARTHRITIS, KNEE, LEFT  . JOINT EFFUSION, KNEE  . Pain in Joint, Lower Leg  . SCIATICA, RIGHT  . TOTAL KNEE FOLLOW-UP  . Pain, knee  . S/P total knee replacement  . Diabetes mellitus  . Focal motor seizure  . Embolic stroke involving middle cerebral artery  . Aspiration into respiratory tract  . Respiratory distress  . CVA (cerebral infarction)  . Muscle weakness (generalized)  . Lack of coordination  . A-fib    End of Session Activity Tolerance: Patient tolerated treatment well General Behavior During Session: [facility] for tasks performed Cognition: [facility] for tasks performed  GO    Shirlean Mylar, OTR/L  03/04/2012, 11:39 AM

## 2012-03-05 DIAGNOSIS — I6990 Unspecified sequelae of unspecified cerebrovascular disease: Secondary | ICD-10-CM | POA: Diagnosis not present

## 2012-03-05 DIAGNOSIS — R569 Unspecified convulsions: Secondary | ICD-10-CM | POA: Diagnosis not present

## 2012-03-06 ENCOUNTER — Ambulatory Visit (HOSPITAL_COMMUNITY): Payer: BC Managed Care – PPO

## 2012-03-07 ENCOUNTER — Inpatient Hospital Stay (HOSPITAL_COMMUNITY)
Admission: RE | Admit: 2012-03-07 | Payer: BC Managed Care – PPO | Source: Ambulatory Visit | Admitting: Speech Pathology

## 2012-03-08 ENCOUNTER — Ambulatory Visit (HOSPITAL_COMMUNITY): Payer: BC Managed Care – PPO | Admitting: Physical Therapy

## 2012-03-11 ENCOUNTER — Ambulatory Visit (HOSPITAL_COMMUNITY)
Admission: RE | Admit: 2012-03-11 | Discharge: 2012-03-11 | Disposition: A | Discharge status: Home / Self Care | Payer: Medicare Other | Source: Ambulatory Visit | Attending: Family Medicine | Admitting: Family Medicine

## 2012-03-11 NOTE — Progress Notes (Signed)
Speech Language Pathology Treatment Patient Details  Name: Brendan Scott MRN: [id number] Date of Birth: July 11, 1945  Today's Date: 03/11/2012 Time: 1120-1158 SLP Time Calculation (min): 38 min  Authorization: Medicare  Authorization Time Period: 02/26/2012-2/24  Authorization Visit#:  2 of  8  HPI:  Symptoms/Limitations Symptoms: "I'm ready to get back to work." Pain Assessment Currently in Pain?: No/denies   Treatment  Cognitive-Linguistic Therapy Dysarthria Therapy Oral Motor Exercises Patient/Family Education Home Exercise Program  SLP Goals  Home Exercise SLP Goal: Patient will Perform Home Exercise Program: with supervision, verbal cues required/provided SLP Goal: Perform Home Exercise Program - Progress: Progressing toward goal SLP Short Term Goals SLP Short Term Goal 1: Pt will use left attention strategies in functional tasks with mi/mod cues. SLP Short Term Goal 1 - Progress: Progressing toward goal SLP Short Term Goal 2: Pt will implement memory strategies in functional tasks with mi/mod cues. SLP Short Term Goal 2 - Progress: Progressing toward goal SLP Short Term Goal 3: Pt will increase recall of functional information to 80% acc with use of compensatory strategies and mi/mod assist. SLP Short Term Goal 3 - Progress: Progressing toward goal SLP Short Term Goal 4: Pt will complete OMEs x10 each (labiolingual) with min cues. SLP Short Term Goal 4 - Progress: Progressing toward goal SLP Short Term Goal 5: Pt will implement speech intelligibility strategies in conversation with min cues for 100% intelligibility. SLP Short Term Goal 5 - Progress: Progressing toward goal SLP Long Term Goals SLP Long Term Goal 1: Pt will increase attention to left with independent use of strategies. SLP Long Term Goal 1 - Progress: Progressing toward goal SLP Long Term Goal 2: Pt will increase memory skills to [facility] in independent environment with use of strategies prn. SLP Long Term Goal  2 - Progress: Progressing toward goal  Assessment/Plan  Patient Active Problem List  Diagnosis  . DIABETES  . OSTEOARTHRITIS, LOWER LEG  . OSTEOARTHRITIS, KNEE, LEFT  . JOINT EFFUSION, KNEE  . Pain in Joint, Lower Leg  . SCIATICA, RIGHT  . TOTAL KNEE FOLLOW-UP  . Pain, knee  . S/P total knee replacement  . Diabetes mellitus  . Focal motor seizure  . Embolic stroke involving middle cerebral artery  . Aspiration into respiratory tract  . Respiratory distress  . CVA (cerebral infarction)  . Muscle weakness (generalized)  . Lack of coordination  . A-fib   SLP - End of Session Activity Tolerance: Patient tolerated treatment well General Behavior During Session: [facility] for tasks performed Cognition: [facility] for tasks performed  SLP Assessment/Plan Clinical Impression Statement: [name] reports that he is no longer pocketing food in left buccal cavity, but does have saliva spill from left side. He says this happens several times a day, usually when he is up working. I gave him a written cue card to remind him to swallow at least every minute and suggested that he swallow before speaking. He had only one loss of saliva this session and that was prior to our discussion. He completed OMEs with mod cues and had a little difficulty grasping the buccal resistance exercises. He was able to achieve the most movement from left buccal with "growl" face and least with lip aversion (smile).  Speech Therapy Frequency: min 2x/week Duration: 4 weeks Treatment/Interventions: Compensatory techniques;SLP instruction and feedback;Patient/family education;Compensatory strategies;Functional tasks;Cueing hierarchy;Oral motor exercises;Internal/external aids;Cognitive reorganization Potential to Achieve Goals: Good  Thank you,  Brendan Scott, CCC-SLP [phone]  Brendan Scott 03/11/2012, 12:32 PM

## 2012-03-11 NOTE — Progress Notes (Signed)
Occupational Therapy Treatment Patient Details  Name: Brendan Scott MRN: [id number] Date of Birth: 04-22-1945  Today's Date: 03/11/2012 Time: 6578-4696 OT Time Calculation (min): 43 min ADL/self care [phone] 4'  Visit#: 6 of 36  Re-eval: 03/20/12    Authorization: medicare  Authorization Time Period: before 10th visit   Authorization Visit#: 6 of 10  Subjective Symptoms/Limitations Symptoms: S:  I wish I could get better and go back to work sooner. Pain Assessment Currently in Pain?: No/denies  Precautions/Restrictions  Precautions Precautions: Fall  Exercise/Treatments     Splinting Splinting: Fabricated left finger extension splint to wear at night to promote PIP joint extension. Provded patient with handout and gave verbal instructions regarding wearing schedule, care, and donning/doffing technique.   Occupational Therapy Assessment and Plan OT Assessment and Plan Clinical Impression Statement: A: Fabricated nighttime finger extension splint to promote PIPJ extension.  OT Plan: P:  Work on fine motor coordination task.   Goals Short Term Goals Time to Complete Short Term Goals: 6 weeks Short Term Goal 1: Patient will be educated and perform HEP. Short Term Goal 1 Progress: Progressing toward goal Short Term Goal 2: Patient will increase AROM in his left shoulder to 75% for increased ablility to reach for objects when completing functional activiites.   Short Term Goal 2 Progress: Progressing toward goal Short Term Goal 3: Patient will increase left grip strength by 10 pounds and pich strength by 5 pounds for increased ability to open containers when fishing. Short Term Goal 3 Progress: Progressing toward goal Short Term Goal 4: Patient will complete Nine Hole Peg Test in standardized fashion. Short Term Goal 4 Progress: Progressing toward goal Short Term Goal 5: Patient will use his left hand as a gross assist with all daily activities. Short Term Goal 5  Progress: Progressing toward goal Long Term Goals Time to Complete Long Term Goals: 12 weeks Long Term Goal 1: Patient will return to highest level of independence with leisure and home activities. Long Term Goal 1 Progress: Progressing toward goal Long Term Goal 2: Patient will increase AROM in his left shoulder to 90% for increased ablility to reach for objects when completing functional activiites.   Long Term Goal 2 Progress: Progressing toward goal Long Term Goal 3: Patient will increase left grip strength by 40 pounds and pich strength by 10  pounds for increased ability to open containers when fishing. Long Term Goal 3 Progress: Progressing toward goal Long Term Goal 4: Patient will improve fine motor coordination in order to complete Nine Hole Peg Test in 45' or less. Long Term Goal 4 Progress: Progressing toward goal Long Term Goal 5: Patient will use left hand to zip coat/jacket/pants without physical assistance. Long Term Goal 5 Progress: Progressing toward goal  Problem List Patient Active Problem List  Diagnosis  . DIABETES  . OSTEOARTHRITIS, LOWER LEG  . OSTEOARTHRITIS, KNEE, LEFT  . JOINT EFFUSION, KNEE  . Pain in Joint, Lower Leg  . SCIATICA, RIGHT  . TOTAL KNEE FOLLOW-UP  . Pain, knee  . S/P total knee replacement  . Diabetes mellitus  . Focal motor seizure  . Embolic stroke involving middle cerebral artery  . Aspiration into respiratory tract  . Respiratory distress  . CVA (cerebral infarction)  . Muscle weakness (generalized)  . Lack of coordination  . A-fib    End of Session Activity Tolerance: Patient tolerated treatment well General Behavior During Session: [facility] for tasks performed Cognition: [facility] for tasks performed   Limmie Patricia,  OTR/L   03/11/2012, 11:47 AM

## 2012-03-11 NOTE — Progress Notes (Signed)
Physical Therapy Treatment Patient Details  Name: Brendan Scott MRN: [id number] Date of Birth: Feb 14, 1945  Today's Date: 03/11/2012 Time: 1020-1100 PT Time Calculation (min): 40 min Visit#: 5 of 12  Re-eval: 03/22/12 Authorization: BCBS/ Medicare  Authorization Visit#: 5 of 10  Charges:  therex 38'  Subjective: Symptoms/Limitations Symptoms: Pt states he's having no pain or complaints today. Pain Assessment Currently in Pain?: No/denies   Exercise/Treatments Aerobic Tread Mill: gait training @ .46 cyc/sec LL 88 in x 6',  vc-ing to advance feet forward, posture, heel to toe gait, and LLE knee flexion Standing Heel Raises: 15 reps;Limitations Heel Raises Limitations: no HHA; toe raises 15 reps no HHA Knee Flexion: Both;15 reps;Limitations Lateral Step Up: Left;15 reps;Hand Hold: 1;Step Height: 4";Limitations Forward Step Up: Left;15 reps;Hand Hold: 1;Step Height: 4" Wall Squat: 10 reps;Limitations Wall Squat Limitations: 5" holds Rocker Board: 3 minutes SLS: R and L 3x30 sec w/intermitted HHA SLS with Vectors: 5X3" holds with L LE Other Standing Knee Exercises: balance beam; tandem gt., retro tandem gait 2RT     Physical Therapy Assessment and Plan PT Assessment and Plan Clinical Impression Statement: Added L SLS with vectors, increased wall slide holds and progressed dynamic balance with retro and fwd tandem gait on balance beam.  Pt. able to complete without LOB.  Overall progressing well with balance and functional strength. PT Plan: Continue with current POC to improve gait mechanics, balance and strengthening     Problem List Patient Active Problem List  Diagnosis  . DIABETES  . OSTEOARTHRITIS, LOWER LEG  . OSTEOARTHRITIS, KNEE, LEFT  . JOINT EFFUSION, KNEE  . Pain in Joint, Lower Leg  . SCIATICA, RIGHT  . TOTAL KNEE FOLLOW-UP  . Pain, knee  . S/P total knee replacement  . Diabetes mellitus  . Focal motor seizure  . Embolic stroke involving middle cerebral  artery  . Aspiration into respiratory tract  . Respiratory distress  . CVA (cerebral infarction)  . Muscle weakness (generalized)  . Lack of coordination  . A-fib    PT - End of Session Equipment Utilized During Treatment: Gait belt Activity Tolerance: Patient tolerated treatment well General Behavior During Session: [facility] for tasks performed Cognition: [facility] for tasks performed   Lurena Nida, PTA/CLT 03/11/2012, 11:04 AM

## 2012-03-13 ENCOUNTER — Ambulatory Visit (HOSPITAL_COMMUNITY)
Admission: RE | Admit: 2012-03-13 | Discharge: 2012-03-13 | Disposition: A | Discharge status: Home / Self Care | Payer: Medicare Other | Source: Ambulatory Visit | Attending: Family Medicine | Admitting: Family Medicine

## 2012-03-13 NOTE — Progress Notes (Signed)
Occupational Therapy Treatment Patient Details  Name: Brendan Scott MRN: [id number] Date of Birth: [birth year]  Today's Date: 03/13/2012 Time: 1020-1105 OT Time Calculation (min): 45 min Therex 1020-1030 10' NM re-ed 1030-1105 35'  Visit#: 7 of 36  Re-eval: 03/20/12    Authorization: medicare  Authorization Time Period: before 10th visit   Authorization Visit#: 7 of 10  Subjective Symptoms/Limitations Symptoms: S: What can I do to make my hand stronger? Pain Assessment Currently in Pain?: No/denies  Precautions/Restrictions  Precautions Precautions: Fall  Exercise/Treatments Hand Exercises MCPJ Flexion: 10 reps;PROM MCPJ Extension: PROM;10 reps PIPJ Flexion: PROM;10 reps PIPJ Extension: PROM;10 reps DIPJ Flexion: PROM;10 reps DIPJ Extension: PROM;10 reps Digit Composite Abduction: PROM;10 reps Digit Composite Adduction: PROM;10 reps Opposition: PROM;10 reps Theraputty: Flatten;Grip;Locate Pegs Theraputty - Flatten: yellow Theraputty - Grip: yellow Theraputty - Pinch: yellow Theraputty - Locate Pegs: 6 small wooden Thumb Opposition: PROM x10 Digit Abduction/Adduction: x10 Large Pegboard: Patient picked up approx 20-25 large pegs off table and placed in bucket with left hand.  In Hand Manipulation Training: Patient removed dominoes from box, flipped them over and stood them up with his left hand.  He then played dominoes with therapist using his left hand.  He had minimal difficulty with flipping the dominoes over, and was able to pick them up and lay them on gametable without difficulty        Occupational Therapy Assessment and Plan OT Assessment and Plan Clinical Impression Statement: A: Required vc's to use left hand during theraputty and domino activity. patient had a tendency to always use right hand first. Wife did report that patient was using his left hand to eat this morning. OT Plan: P:  Work on fine motor coordination task.   Goals Short Term  Goals Time to Complete Short Term Goals: 6 weeks Short Term Goal 1: Patient will be educated and perform HEP. Short Term Goal 2: Patient will increase AROM in his left shoulder to 75% for increased ablility to reach for objects when completing functional activiites.   Short Term Goal 3: Patient will increase left grip strength by 10 pounds and pich strength by 5 pounds for increased ability to open containers when fishing. Short Term Goal 4: Patient will complete Nine Hole Peg Test in standardized fashion. Short Term Goal 5: Patient will use his left hand as a gross assist with all daily activities. Long Term Goals Time to Complete Long Term Goals: 12 weeks Long Term Goal 1: Patient will return to highest level of independence with leisure and home activities. Long Term Goal 2: Patient will increase AROM in his left shoulder to 90% for increased ablility to reach for objects when completing functional activiites.   Long Term Goal 3: Patient will increase left grip strength by 40 pounds and pich strength by 10  pounds for increased ability to open containers when fishing. Long Term Goal 4: Patient will improve fine motor coordination in order to complete Nine Hole Peg Test in 89' or less. Long Term Goal 5: Patient will use left hand to zip coat/jacket/pants without physical assistance.  Problem List Patient Active Problem List  Diagnosis  . DIABETES  . OSTEOARTHRITIS, LOWER LEG  . OSTEOARTHRITIS, KNEE, LEFT  . JOINT EFFUSION, KNEE  . Pain in Joint, Lower Leg  . SCIATICA, RIGHT  . TOTAL KNEE FOLLOW-UP  . Pain, knee  . S/P total knee replacement  . Diabetes mellitus  . Focal motor seizure  . Embolic stroke involving middle cerebral artery  .  Aspiration into respiratory tract  . Respiratory distress  . CVA (cerebral infarction)  . Muscle weakness (generalized)  . Lack of coordination  . A-fib    End of Session Activity Tolerance: Patient tolerated treatment well General Behavior  During Session: [facility] for tasks performed Cognition: [facility] for tasks performed   Limmie Patricia, OTR/L  03/13/2012, 11:22 AM

## 2012-03-13 NOTE — Progress Notes (Signed)
Physical Therapy Treatment Patient Details  Name: Brendan Scott MRN: [id number] Date of Birth: 22-Aug-1945  Today's Date: 03/13/2012 Time: 4098-1191 PT Time Calculation (min): 40 min  Visit#: 6 of 12  Re-eval: 03/22/12 Authorization: BCBS/ Medicare  Authorization Visit#: 6 of 10  Charges:  therex 10', NMR 28'  Subjective: Symptoms/Limitations Symptoms: Pt reports he is doing well without pain or complaints.  No reports of falls. Pain Assessment Currently in Pain?: No/denies   Exercise/Treatments Aerobic Tread Mill: gait training @ .46 cyc/sec LL 88 in x 6',  vc-ing to advance feet forward, posture, heel to toe gait, and LLE knee flexion Standing Heel Raises: 15 reps;Limitations Heel Raises Limitations: no HHA; toe raises 15 reps no HHA Knee Flexion: Both;10 reps Knee Flexion Limitations: alternating Lateral Step Up: Left;15 reps;Hand Hold: 1;Step Height: 4";Limitations Forward Step Up: Left;15 reps;Hand Hold: 1;Step Height: 4" Wall Squat: 10 reps Wall Squat Limitations: 5" holds Rocker Board: 3 minutes Other Standing Knee Exercises: balance beam; tandem gt, retro tandem gait 2RT Other Standing Knee Exercises: high march and hold 10x 5", hip extend BLE 10x, abduction 10x  Seated Other Seated Knee Exercises: sit to stands no UE's 2X5 reps     Physical Therapy Assessment and Plan PT Assessment and Plan Clinical Impression Statement: Added sit to stands wihout use of UE's; pt tends to bend far forward to use body weight/momentum to assist with stand.  Able to complete balance beam without LOB going forward and retro.  Pt. progressing well with stability; may need more work on [employer]. PT Plan: Continue with current POC to improve gait mechanics, balance and strengthening.  Work on LE strengthening/power.     Problem List Patient Active Problem List  Diagnosis  . DIABETES  . OSTEOARTHRITIS, LOWER LEG  . OSTEOARTHRITIS, KNEE, LEFT  . JOINT EFFUSION, KNEE  . Pain  in Joint, Lower Leg  . SCIATICA, RIGHT  . TOTAL KNEE FOLLOW-UP  . Pain, knee  . S/P total knee replacement  . Diabetes mellitus  . Focal motor seizure  . Embolic stroke involving middle cerebral artery  . Aspiration into respiratory tract  . Respiratory distress  . CVA (cerebral infarction)  . Muscle weakness (generalized)  . Lack of coordination  . A-fib    PT - End of Session Equipment Utilized During Treatment: Gait belt Activity Tolerance: Patient tolerated treatment well General Behavior During Session: [facility] for tasks performed Cognition: [facility] for tasks performed   Lurena Nida, PTA/CLT 03/13/2012, 10:20 AM

## 2012-03-14 ENCOUNTER — Ambulatory Visit (HOSPITAL_COMMUNITY): Payer: BC Managed Care – PPO | Admitting: Speech Pathology

## 2012-03-14 ENCOUNTER — Ambulatory Visit (HOSPITAL_COMMUNITY): Payer: BC Managed Care – PPO | Admitting: Physical Therapy

## 2012-03-15 ENCOUNTER — Ambulatory Visit (HOSPITAL_COMMUNITY): Payer: BC Managed Care – PPO

## 2012-03-19 ENCOUNTER — Ambulatory Visit (HOSPITAL_COMMUNITY)
Admission: RE | Admit: 2012-03-19 | Discharge: 2012-03-19 | Disposition: A | Discharge status: Home / Self Care | Payer: Medicare Other | Source: Ambulatory Visit | Attending: Family Medicine | Admitting: Family Medicine

## 2012-03-19 NOTE — Progress Notes (Signed)
Speech Language Pathology Treatment Patient Details  Name: Brendan Scott MRN: [id number] Date of Birth: [birth year]  Today's Date: 03/19/2012 Time: 5956-3875 SLP Time Calculation (min): 31 min  Authorization: Medicare  Authorization Time Period: 02/26/2012-2/24  Authorization Visit#:  3 of   8  HPI:  Symptoms/Limitations Symptoms: "I was sick over the week end." Pain Assessment Currently in Pain?: No/denies    Treatment  Cognitive-Linguistic Therapy Dysarthria Therapy Oral Motor Exercises Patient/Family Education Home Exercise Program  SLP Goals  Home Exercise SLP Goal: Patient will Perform Home Exercise Program: with supervision, verbal cues required/provided SLP Goal: Perform Home Exercise Program - Progress: Progressing toward goal SLP Short Term Goals SLP Short Term Goal 1: Pt will use left attention strategies in functional tasks with mi/mod cues. SLP Short Term Goal 1 - Progress: Progressing toward goal SLP Short Term Goal 2: Pt will implement memory strategies in functional tasks with mi/mod cues. SLP Short Term Goal 2 - Progress: Progressing toward goal SLP Short Term Goal 3: Pt will increase recall of functional information to 80% acc with use of compensatory strategies and mi/mod assist. SLP Short Term Goal 3 - Progress: Progressing toward goal SLP Short Term Goal 4: Pt will complete OMEs x10 each (labiolingual) with min cues. SLP Short Term Goal 4 - Progress: Progressing toward goal SLP Short Term Goal 5: Pt will implement speech intelligibility strategies in conversation with min cues for 100% intelligibility. SLP Short Term Goal 5 - Progress: Progressing toward goal SLP Long Term Goals SLP Long Term Goal 1: Pt will increase attention to left with independent use of strategies. SLP Long Term Goal 1 - Progress: Progressing toward goal SLP Long Term Goal 2: Pt will increase memory skills to [facility] in independent environment with use of strategies prn. SLP Long Term  Goal 2 - Progress: Progressing toward goal  Assessment/Plan  Patient Active Problem List  Diagnosis  . DIABETES  . OSTEOARTHRITIS, LOWER LEG  . OSTEOARTHRITIS, KNEE, LEFT  . JOINT EFFUSION, KNEE  . Pain in Joint, Lower Leg  . SCIATICA, RIGHT  . TOTAL KNEE FOLLOW-UP  . Pain, knee  . S/P total knee replacement  . Diabetes mellitus  . Focal motor seizure  . Embolic stroke involving middle cerebral artery  . Aspiration into respiratory tract  . Respiratory distress  . CVA (cerebral infarction)  . Muscle weakness (generalized)  . Lack of coordination  . A-fib   SLP - End of Session Activity Tolerance: Patient tolerated treatment well General Behavior During Session: [facility] for tasks performed Cognition: [facility] for tasks performed  SLP Assessment/Plan Clinical Impression Statement: Mr. Bose has been practicing his oral motor exercises at home, but he cannot locate the written list- will provide again. OMEs completed in session with improved left labial retraction when using the mirror for feedback, He should be doing this exercise in front of mirror at home, otherwise performance decreases. He implemented speech intelligibility strategies with min cues in sentence repetition tasks. He has difficulty verbalizing a response to open ended questions. He required mod cues for memory/recall activity and benefits from association and repetition. Speech Therapy Frequency: min 2x/week Duration: 4 weeks Treatment/Interventions: Compensatory techniques;SLP instruction and feedback;Patient/family education;Compensatory strategies;Functional tasks;Cueing hierarchy;Oral motor exercises;Internal/external aids;Cognitive reorganization Potential to Achieve Goals: Good  GN Functional Assessment Tool Used: informal cognitive-linguistic evaluation Memory Current Status ([id number]): At least 40 percent but less than 60 percent impaired, limited or restricted  [name],[name] 03/19/2012, 10:33 AM

## 2012-03-19 NOTE — Progress Notes (Signed)
Physical Therapy Treatment Patient Details  Name: Brendan Scott MRN: [id number] Date of Birth: 04-29-1945  Today's Date: 03/19/2012 Time: 5956-3875 PT Time Calculation (min): 32 min  Visit#: 7 of 12  Re-eval: 03/22/12 Assessment Diagnosis: CVA Prior Therapy: CIR until 12/24; HH until 02/16/12 Charge: NMR 30'  Authorization: BCBS/ Medicare  Authorization Time Period:    Authorization Visit#: 7 of 10   Subjective: Symptoms/Limitations Symptoms: Reports being sick this past weekend with increased fatigue wtih activity today.  No pain or reports of falls. Pain Assessment Currently in Pain?: No/denies  Precautions/Restrictions  Precautions Precautions: Fall  Exercise/Treatments Aerobic Tread Mill: gait training @ .46 cyc/sec LL 88 in x 5',  vc-ing to advance feet forward, posture, heel to toe gait, and LLE knee flexion Standing Heel Raises: 15 reps;Limitations Heel Raises Limitations: no HHA; toe raises 15 reps no HHA Functional Squat: 10 reps Other Standing Knee Exercises: tandem stance 2x 30" Other Standing Knee Exercises: high march and hold 10x 5", hip extend BLE 10x, abduction 10x  Seated Other Seated Knee Exercises: STS no UEs x5    Physical Therapy Assessment and Plan PT Assessment and Plan Clinical Impression Statement: Pt limited by fatigue this session following sick weekend.  PT session ended early due to decreased activity tolerance, pt required increased cueing for posture and good form with gait training and standing strengthening/balance activities.  PT Plan: Continue with current POC to improve gait mechanics, balance and strengthening.  Work on LE strengthening/power.    Goals    Problem List Patient Active Problem List  Diagnosis  . DIABETES  . OSTEOARTHRITIS, LOWER LEG  . OSTEOARTHRITIS, KNEE, LEFT  . JOINT EFFUSION, KNEE  . Pain in Joint, Lower Leg  . SCIATICA, RIGHT  . TOTAL KNEE FOLLOW-UP  . Pain, knee  . S/P total knee replacement  .  Diabetes mellitus  . Focal motor seizure  . Embolic stroke involving middle cerebral artery  . Aspiration into respiratory tract  . Respiratory distress  . CVA (cerebral infarction)  . Muscle weakness (generalized)  . Lack of coordination  . A-fib    PT - End of Session Equipment Utilized During Treatment: Gait belt Activity Tolerance: Patient limited by fatigue General Behavior During Session: [facility] for tasks performed Cognition: [facility] for tasks performed  GP    Juel Burrow 03/19/2012, 11:39 AM

## 2012-03-19 NOTE — Progress Notes (Signed)
Occupational Therapy Treatment Patient Details  Name: Brendan Scott MRN: [id number] Date of Birth: February 26, 1945  Today's Date: 03/19/2012 Time: 1020-1100 OT Time Calculation (min): 40 min Therex 1020-1030 10' NM re-ed 1030-1100 30'  Visit#: 8 of 36  Re-eval: 03/20/12    Authorization: medicare  Authorization Time Period: before 10th visit   Authorization Visit#: 8 of 10  Subjective Symptoms/Limitations Symptoms: S: My splint needs to be adjusted but I forgot to bring it. I will bring it on Thursday. Pain Assessment Currently in Pain?: No/denies  Precautions/Restrictions  Precautions Precautions: Fall  Exercise/Treatments Elbow Exercises Forearm Supination: Seated;AROM;10 reps Forearm Pronation: Seated;AROM;10 reps Wrist Flexion: Seated;AROM;10 reps Wrist Extension: Seated;AROM;10 reps  Wrist Exercises Forearm Supination: Seated;AROM;10 reps Forearm Pronation: Seated;AROM;10 reps Wrist Flexion: Seated;AROM;10 reps Wrist Extension: Seated;AROM;10 reps Wrist Radial Deviation: Seated;AROM;10 reps Wrist Ulnar Deviation: Seated;AROM;10 reps Other wrist exercises: velcro board;large handle, large lateral key shape, large cylinder x2 left/right   Hand Exercises Digit Composite Abduction: PROM;10 reps Digit Composite Adduction: PROM;10 reps Opposition: PROM;10 reps Theraputty: Flatten;Grip;Locate Pegs Theraputty - Flatten: yellow Theraputty - Grip: yellow Theraputty - Pinch: yellow Theraputty - Locate Pegs: 10 small plastic Thumb Opposition: PROM x10 Digit Abduction/Adduction: x10        Occupational Therapy Assessment and Plan OT Assessment and Plan Clinical Impression Statement: A: Patient attend to left side with vc's this session. OT Plan: P:  Work on fine motor coordination task.   Goals Short Term Goals Time to Complete Short Term Goals: 6 weeks Short Term Goal 1: Patient will be educated and perform HEP. Short Term Goal 1 Progress: Progressing toward  goal Short Term Goal 2: Patient will increase AROM in his left shoulder to 75% for increased ablility to reach for objects when completing functional activiites.   Short Term Goal 2 Progress: Progressing toward goal Short Term Goal 3: Patient will increase left grip strength by 10 pounds and pich strength by 5 pounds for increased ability to open containers when fishing. Short Term Goal 3 Progress: Progressing toward goal Short Term Goal 4: Patient will complete Nine Hole Peg Test in standardized fashion. Short Term Goal 4 Progress: Progressing toward goal Short Term Goal 5: Patient will use his left hand as a gross assist with all daily activities. Short Term Goal 5 Progress: Progressing toward goal Long Term Goals Time to Complete Long Term Goals: 12 weeks Long Term Goal 1: Patient will return to highest level of independence with leisure and home activities. Long Term Goal 1 Progress: Progressing toward goal Long Term Goal 2: Patient will increase AROM in his left shoulder to 90% for increased ablility to reach for objects when completing functional activiites.   Long Term Goal 2 Progress: Progressing toward goal Long Term Goal 3: Patient will increase left grip strength by 40 pounds and pich strength by 10  pounds for increased ability to open containers when fishing. Long Term Goal 3 Progress: Progressing toward goal Long Term Goal 4: Patient will improve fine motor coordination in order to complete Nine Hole Peg Test in 45' or less. Long Term Goal 4 Progress: Progressing toward goal Long Term Goal 5: Patient will use left hand to zip coat/jacket/pants without physical assistance. Long Term Goal 5 Progress: Progressing toward goal  Problem List Patient Active Problem List  Diagnosis  . DIABETES  . OSTEOARTHRITIS, LOWER LEG  . OSTEOARTHRITIS, KNEE, LEFT  . JOINT EFFUSION, KNEE  . Pain in Joint, Lower Leg  . SCIATICA, RIGHT  . TOTAL KNEE FOLLOW-UP  .  Pain, knee  . S/P total knee  replacement  . Diabetes mellitus  . Focal motor seizure  . Embolic stroke involving middle cerebral artery  . Aspiration into respiratory tract  . Respiratory distress  . CVA (cerebral infarction)  . Muscle weakness (generalized)  . Lack of coordination  . A-fib    End of Session Activity Tolerance: Patient tolerated treatment well General Behavior During Session: [facility] for tasks performed Cognition: [facility] for tasks performed   Limmie Patricia, OTR/L  03/19/2012, 11:53 AM

## 2012-03-21 ENCOUNTER — Ambulatory Visit (HOSPITAL_COMMUNITY)
Admission: RE | Admit: 2012-03-21 | Discharge: 2012-03-21 | Disposition: A | Discharge status: Home / Self Care | Payer: Medicare Other | Source: Ambulatory Visit | Attending: Family Medicine | Admitting: Family Medicine

## 2012-03-21 NOTE — Evaluation (Signed)
Physical Therapy Discharge  Patient Details  Name: Brendan Scott MRN: [id number] Date of Birth: 1945-12-16  Today's Date: 03/21/2012 Time: 0931-1015 PT Time Calculation (min): 44 min Charges:  1 MMT, 15 PPT,   Visit#: 8 of 12  Re-eval: 03/22/12 Assessment Diagnosis: CVA Prior Therapy: CIR until 12/24; HH until 02/16/12  Authorization: BCBS/ Medicare  Authorization Time Period:    Authorization Visit#: 8 of 10    Subjective Symptoms/Limitations Symptoms: Reports he is feeling a lot better today then he was on Monday.  Reports adherence with HEP.  Pain Assessment Currently in Pain?: No/denies   Assessment RLE Strength Right Hip Flexion: 5/5 (was 4/5) Right Hip Extension: 4/5 (was 3-/5) Right Hip ABduction:  (4+/5, was 4/5) Right Hip ADduction: 5/5 (was 4/5) Right Knee Flexion: 5/5 (was 4+/5) Right Knee Extension: 5/5 (was 5/5) Right Ankle Dorsiflexion: 5/5 (was 5/) LLE AROM (degrees) Left Knee Extension: 10 Left Knee Flexion: 60 LLE Strength Left Hip Flexion: 5/5 (was 5/5) Left Hip Extension: 4/5 (was 3-/5) Left Hip ABduction: 4/5 (was 3/5) Left Hip ADduction: 4/5 (was 4/5) Left Knee Flexion: 4/5 (was 4/5) Left Knee Extension: 5/5 (was 5/5) Left Ankle Dorsiflexion: 5/5 (4+/5)  Exercise/Treatments Mobility/Balance  Berg Balance Test Sit to Stand: Able to stand without using hands and stabilize independently Standing Unsupported: Able to stand safely 2 minutes Sitting with Back Unsupported but Feet Supported on Floor or Stool: Able to sit safely and securely 2 minutes Stand to Sit: Sits safely with minimal use of hands Transfers: Able to transfer safely, minor use of hands Standing Unsupported with Eyes Closed: Able to stand 10 seconds safely Standing Ubsupported with Feet Together: Able to place feet together independently and stand 1 minute safely From Standing, Reach Forward with Outstretched Arm: Can reach confidently >25 cm (10") From Standing Position, Pick up  Object from Floor: Able to pick up shoe safely and easily From Standing Position, Turn to Look Behind Over each Shoulder: Looks behind from both sides and weight shifts well Turn 360 Degrees: Able to turn 360 degrees safely in 4 seconds or less Standing Unsupported, Alternately Place Feet on Step/Stool: Able to stand independently and safely and complete 8 steps in 20 seconds Standing Unsupported, One Foot in Front: Able to place foot tandem independently and hold 30 seconds Standing on One Leg: Able to lift leg independently and hold equal to or more than 3 seconds Total Score: 54 Timed Up and Go Test TUG: Normal TUG Normal TUG (seconds): 13 (independently)   Aerobic   Machines for Strengthening   Standing  Stair well 1 RT w/reciprocal pattern and 1 handrail   Physical Therapy Assessment and Plan PT Assessment and Plan Clinical Impression Statement: Brendan Scott has attended 9 OP PT visits s/p CVA w/LLE weakness w/following findings: he has met all STG and 2/3 LTG.  Limited by activity tolerance and educated pt how to continue with improving activity tolerance at home.  Berg Balance Test; 54/56, TUG: 13 sec independently; R and L LE strength WFL; most limited by L knee AROM 10-60 degrees which he has had for 3 years.  he is very motivated to return to work.  At this time he will be d/c from PT secondary to 5/6 goals met and pt can continue with strengtheining and endurance at home.  PT Plan: D/C    Goals Pt will Perform Home Exercise Program: Independently: Met PT Short Term Goals: 2 weeks Goal 1: increases strength 1/2 grade to allow pt to come  sit to stand on one try without using hands: Met Goal 2: Pt to state no falls in the past 2 weeks.: Met Goal 3: Pt to be able to walk for ten minutes at a time: Met (waling 1/4 mile takes hime 10 minutes) PT Long Term Goals: 4 weeks Goal 1: strength increased by one grade to allow pt to ambulate for 20 minutes at a time.: Progressing toward  goal Goal 2: Pt Berg to be increased 10 points to allow pt to be safe ambulating without an assistive device.: Met Goal 3: Pt to be able to go up and down steps reciprocally: Partly met (ascend reciprocal w/1 handrail; descend reciprocal w/S) Goal 4: Pt to be able to return to part-time work. (looking for part time work) Progressing toward goal  Problem List Patient Active Problem List  Diagnosis  . DIABETES  . OSTEOARTHRITIS, LOWER LEG  . OSTEOARTHRITIS, KNEE, LEFT  . JOINT EFFUSION, KNEE  . Pain in Joint, Lower Leg  . SCIATICA, RIGHT  . TOTAL KNEE FOLLOW-UP  . Pain, knee  . S/P total knee replacement  . Diabetes mellitus  . Focal motor seizure  . Embolic stroke involving middle cerebral artery  . Aspiration into respiratory tract  . Respiratory distress  . CVA (cerebral infarction)  . Muscle weakness (generalized)  . Lack of coordination  . A-fib    General Behavior During Session: [facility] for tasks performed Cognition: [facility] for tasks performed PT Plan of Care PT Home Exercise Plan: updated w/advanced HEP PT Patient Instructions: discussed contining HEP and discussed strategies to improve endurance.  Consulted and Agree with Plan of Care: Patient  GP [facility] PT THERAPY MOBILITY DISCHARGE STATUS [id number] 03/21/2012 Matilde Haymaker, PT GP, CJ 1 Filed [facility] PT THERAPY MOBILITY GOAL STATUS [id number] 03/21/2012 Matilde Haymaker, PT GP, CJ 1 Filed  Brendan Scott Brendan Scott, [place] 03/21/2012, 1:06 PM  Physician Documentation Your signature is required to indicate approval of the treatment plan as stated above.  Please sign and either send electronically or make a copy of this report for your files and return this physician signed original.   Please mark one 1.__approve of plan  2. ___approve of plan with the following conditions.   ______________________________                                                          _____________________ Physician Signature                                                                                                              Date

## 2012-03-21 NOTE — Progress Notes (Signed)
Speech Language Pathology Treatment Patient Details  Name: MC BLOODWORTH MRN: [id number] Date of Birth: 03-06-1945  Today's Date: 03/21/2012 Time: 1015-1100 SLP Time Calculation (min): 45 min  Authorization: Medicare  Authorization Time Period: 02/26/2012-2/24  Authorization Visit#:  4 of  8  HPI:  Symptoms/Limitations Symptoms: Doing well, accompanied by his brother Pain Assessment Currently in Pain?: No/denies  Treatment  Cognitive-Linguistic Therapy Dysarthria Therapy Oral Motor Exercises Patient/Family Education Home Exercise Program  SLP Goals  Home Exercise SLP Goal: Patient will Perform Home Exercise Program: with supervision, verbal cues required/provided SLP Goal: Perform Home Exercise Program - Progress: Progressing toward goal SLP Short Term Goals SLP Short Term Goal 1: Pt will use left attention strategies in functional tasks with mi/mod cues. SLP Short Term Goal 1 - Progress: Progressing toward goal SLP Short Term Goal 2: Pt will implement memory strategies in functional tasks with mi/mod cues. SLP Short Term Goal 2 - Progress: Progressing toward goal SLP Short Term Goal 3: Pt will increase recall of functional information to 80% acc with use of compensatory strategies and mi/mod assist. SLP Short Term Goal 3 - Progress: Progressing toward goal SLP Short Term Goal 4: Pt will complete OMEs x10 each (labiolingual) with min cues. SLP Short Term Goal 4 - Progress: Progressing toward goal SLP Short Term Goal 5: Pt will implement speech intelligibility strategies in conversation with min cues for 100% intelligibility. SLP Short Term Goal 5 - Progress: Progressing toward goal SLP Long Term Goals SLP Long Term Goal 1: Pt will increase attention to left with independent use of strategies. SLP Long Term Goal 1 - Progress: Progressing toward goal SLP Long Term Goal 2: Pt will increase memory skills to [facility] in independent environment with use of strategies prn. SLP Long Term  Goal 2 - Progress: Progressing toward goal  Assessment/Plan  Patient Active Problem List  Diagnosis  . DIABETES  . OSTEOARTHRITIS, LOWER LEG  . OSTEOARTHRITIS, KNEE, LEFT  . JOINT EFFUSION, KNEE  . Pain in Joint, Lower Leg  . SCIATICA, RIGHT  . TOTAL KNEE FOLLOW-UP  . Pain, knee  . S/P total knee replacement  . Diabetes mellitus  . Focal motor seizure  . Embolic stroke involving middle cerebral artery  . Aspiration into respiratory tract  . Respiratory distress  . CVA (cerebral infarction)  . Muscle weakness (generalized)  . Lack of coordination  . A-fib   SLP - End of Session Activity Tolerance: Patient tolerated treatment well General Behavior During Session: [facility] for tasks performed Cognition: [facility] for tasks performed  SLP Assessment/Plan Clinical Impression Statement: Mr. Camper needs mi/mod cues to stay on task when completing OMEs. I minimized his exercise list to just 2 exercises to help him stay on track (lip protrusion/retraction and lingual ROM). Improved movement on left side (upper lip) when using the mirror for feedback. Hi s wife says he has a hard time "paying attention" at home and this is evident in therapy as well.  Auditory comprehension is decreased when he is looking at something else and he needs repetition and tactile cues to "switch gears". He completed Trails A (connect #s 1-25) in 4 minutes (significantly delayed). He expressed an interest in driving again and I explained that it would be a while due to left inattention and slow reaction time. During a 4 word item recall task, he recalled 3/4 words independently after mod/max cues to use memory strategies initially. Speech Therapy Frequency: min 2x/week Duration: 4 weeks Treatment/Interventions: Compensatory techniques;SLP instruction and feedback;Patient/family  education;Compensatory strategies;Functional tasks;Cueing hierarchy;Oral motor exercises;Internal/external aids;Cognitive reorganization Potential to  Achieve Goals: Good    [name],[name] 03/21/2012, 11:27 AM

## 2012-03-21 NOTE — Progress Notes (Signed)
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 Pain Assessment Currently in Pain?: No/denies  Precautions/Restrictions  Precautions Precautions: Fall  Exercise/Treatments Hand Exercises Theraputty: Flatten;Roll;Grip;Pinch Theraputty - Flatten: yellow Theraputty - Roll: yellow Theraputty - Grip: yellow Theraputty - Pinch: yellow Other Hand Exercises: Black handgripper; 10 squeezes  Work Psychologist, educational Sled: Pushed sled around gym to [employer] pick up at job. Patient emptied all trash into can in sled. Required Max vc's to locate trash cans on left side.       Splinting Splinting: Adjusted left extension splint to allow for increase extension. Re-applied velro to splint as they had fallen off.  Occupational Therapy Assessment and Plan OT Assessment and Plan Clinical Impression Statement: A: Added work hardening task. Patient stated that the sled was very difficult to push and he pushes a trash can on wheels at work. OT Plan: P: RE-ASSESS. Use cart on wheels to simulate work activty.   Goals Short Term Goals Time to Complete Short Term Goals: 6 weeks Short Term Goal 1: Patient will be educated and perform HEP. Short Term Goal 2: Patient will increase AROM in his left shoulder to 75% for increased ablility to reach for objects when completing functional activiites.   Short Term Goal 3: Patient will increase left grip strength by 10 pounds and pich strength by 5 pounds for increased ability to open containers when fishing. Short Term Goal 4:  Patient will complete Nine Hole Peg Test in standardized fashion. Short Term Goal 5: Patient will use his left hand as a gross assist with all daily activities. Long Term Goals Time to Complete Long Term Goals: 12 weeks Long Term Goal 1: Patient will return to highest level of independence with leisure and home activities. Long Term Goal 2: Patient will increase AROM in his left shoulder to 90% for increased ablility to reach for objects when completing functional activiites.   Long Term Goal 3: Patient will increase left grip strength by 40 pounds and pich strength by 10  pounds for increased ability to open containers when fishing. Long Term Goal 4: Patient will improve fine motor coordination in order to complete Nine Hole Peg Test in 51' or less. Long Term Goal 5: Patient will use left hand to zip coat/jacket/pants without physical assistance.  Problem List Patient Active Problem List  Diagnosis  . DIABETES  . OSTEOARTHRITIS, LOWER LEG  . OSTEOARTHRITIS, KNEE, LEFT  . JOINT EFFUSION, KNEE  . Pain in Joint, Lower Leg  . SCIATICA, RIGHT  . TOTAL KNEE FOLLOW-UP  . Pain, knee  . S/P total knee replacement  . Diabetes mellitus  . Focal motor seizure  . Embolic stroke involving middle cerebral artery  . Aspiration into respiratory tract  . Respiratory distress  . CVA (cerebral infarction)  . Muscle weakness (generalized)  . Lack of coordination  . A-fib    End of Session Activity Tolerance: Patient tolerated treatment well General Behavior During Session: [facility] for tasks performed Cognition: [facility] for tasks performed   Limmie Patricia, OTR/L  03/21/2012, 11:57 AM

## 2012-03-25 ENCOUNTER — Ambulatory Visit (HOSPITAL_COMMUNITY): Payer: BC Managed Care – PPO | Admitting: Speech Pathology

## 2012-03-25 ENCOUNTER — Ambulatory Visit (HOSPITAL_COMMUNITY)
Admission: RE | Admit: 2012-03-25 | Discharge: 2012-03-25 | Disposition: A | Discharge status: Home / Self Care | Payer: Medicare Other | Source: Ambulatory Visit | Attending: Family Medicine | Admitting: Family Medicine

## 2012-03-25 NOTE — Progress Notes (Signed)
Speech Language Pathology Treatment/Progress Note Patient Details  Name: DIQUAN KASSIS MRN: [id number] Date of Birth: 06-28-1945  Today's Date: 03/25/2012 Time: 0930-1015 SLP Time Calculation (min): 45 min  Authorization: Medicare  Authorization Time Period: 02/26/2012-2/24  Authorization Visit#:  5 of  8   HPI:  Symptoms/Limitations Symptoms: Doing well, he completed homework but did not bring it in. Pain Assessment Currently in Pain?: No/denies  Treatment  Cognitive-Linguistic Therapy Dysarthria Therapy Oral Motor Exercises Patient/Family Education Home Exercise Program  SLP Goals  Home Exercise SLP Goal: Patient will Perform Home Exercise Program: with supervision, verbal cues required/provided SLP Short Term Goals SLP Short Term Goal 1: Pt will use left attention strategies in functional tasks with mi/mod cues. SLP Short Term Goal 1 - Progress: Progressing toward goal SLP Short Term Goal 2: Pt will implement memory strategies in functional tasks with mi/mod cues. SLP Short Term Goal 2 - Progress: Progressing toward goal SLP Short Term Goal 3: Pt will increase recall of functional information to 80% acc with use of compensatory strategies and mi/mod assist. SLP Short Term Goal 3 - Progress: Progressing toward goal SLP Short Term Goal 4: Pt will complete OMEs x10 each (labiolingual) with min cues. SLP Short Term Goal 4 - Progress: Progressing toward goal SLP Short Term Goal 5: Pt will implement speech intelligibility strategies in conversation with min cues for 100% intelligibility. SLP Short Term Goal 5 - Progress: Progressing toward goal SLP Long Term Goals SLP Long Term Goal 1: Pt will increase attention to left with independent use of strategies. SLP Long Term Goal 1 - Progress: Progressing toward goal SLP Long Term Goal 2: Pt will increase memory skills to [facility] in independent environment with use of strategies prn. SLP Long Term Goal 2 - Progress: Progressing toward  goal  Assessment/Plan  Patient Active Problem List  Diagnosis  . DIABETES  . OSTEOARTHRITIS, LOWER LEG  . OSTEOARTHRITIS, KNEE, LEFT  . JOINT EFFUSION, KNEE  . Pain in Joint, Lower Leg  . SCIATICA, RIGHT  . TOTAL KNEE FOLLOW-UP  . Pain, knee  . S/P total knee replacement  . Diabetes mellitus  . Focal motor seizure  . Embolic stroke involving middle cerebral artery  . Aspiration into respiratory tract  . Respiratory distress  . CVA (cerebral infarction)  . Muscle weakness (generalized)  . Lack of coordination  . A-fib   SLP - End of Session Activity Tolerance: Patient tolerated treatment well General Behavior During Session: [facility] for tasks performed Cognition: [facility] for tasks performed  SLP Assessment/Plan Clinical Impression Statement: Mr. Levene continues to need mi/mod cues to stay on task for OMEs (complete lip exercises before lingual). He said that he completed his homework and his wife asked him to send more. I provided him with a folder for schedule and homework today. He was able to follow a written schedule for our session with min cues and only missed the final activity (last one planned) due to completion of visual attention task. He was given 4x4 word search puzzles (6 puzzles to page with 3 items each) and he completed with 70% accuracy, but completed them out of order- jumping around the page. He need mod/max verbal and external cues (yellow line down left side and numbered sequence to follow) to complete additional page. Mr. Gan was only able to attend 4 treatment sessions this month due to weather conditions and illness. He has made good progress toward all goals, but needs another 4 weeks to address them. Speech Therapy  Frequency: min 2x/week Duration: 4 weeks Treatment/Interventions: Compensatory techniques;SLP instruction and feedback;Patient/family education;Compensatory strategies;Functional tasks;Cueing hierarchy;Oral motor exercises;Internal/external  aids;Cognitive reorganization Potential to Achieve Goals: Good     Thank you,  Havery Moros, CCC-SLP [phone]  [name],[name] 03/25/2012, 12:18 PM                                                     Physician Treatment Plan  Your signature is required to indicate approval of the treatment plan/progress as stated above. Please make a copy of this report for your files and return this physician signed original in the self-addressed envelope or fax to [phone]. COMMENTS/CHANGES:__________________________________________________________________________________________________________________________   ____________________________________                      ____________________ Benjamin Stain                                                    DATE

## 2012-03-25 NOTE — Progress Notes (Addendum)
Occupational Therapy Treatment Patient Details  Name: Brendan Scott MRN: [id number] Date of Birth: August 10, 1945  Today's Date: 03/25/2012 Time: 1022-1100 OT Time Calculation (min): 38 min Re-assess 1022-1045 23' Theract 1045-1100 15'  Visit#: 10 of 36  Re-eval: 04/22/12    Authorization: medicare  Authorization Time Period: before 20th visit   Authorization Visit#: 10 of 20  Subjective Symptoms/Limitations Symptoms: S: I need to get my fingers working better. Pain Assessment Currently in Pain?: No/denies  Precautions/Restrictions  Precautions Precautions: Fall  Exercise/Treatments Work [employer] Exercises Push/Pull Sled: Pushed wheeled cart around gym to simulate trash pick up at job. patient emptied all trash into can on cart. No vc's needed to locate trash cans on left side.   Occupational Therapy Assessment and Plan OT Assessment and Plan Clinical Impression Statement: A: See MD note for progress. OT Plan: P: Work on fine and gross motor coordination and increase pinch and grip strength of left hand. Bring fishing pole for next tx session and work on Veterinary surgeon.  Goals Short Term Goals Time to Complete Short Term Goals: 6 weeks Short Term Goal 1: Patient will be educated and perform HEP. Short Term Goal 1 Progress: Met Short Term Goal 2: Patient will increase AROM in his left shoulder to 75% for increased ablility to reach for objects when completing functional activiites.   Short Term Goal 2 Progress: Met Short Term Goal 3: Patient will increase left grip strength by 10 pounds and pich strength by 5 pounds for increased ability to open containers when fishing. Short Term Goal 3 Progress: Progressing toward goal Short Term Goal 4: Patient will complete Nine Hole Peg Test in standardized fashion. Short Term Goal 4 Progress: Met Short Term Goal 5: Patient will use his left hand as a gross assist with all daily activities. Short Term Goal 5 Progress: Met Long Term  Goals Time to Complete Long Term Goals: 12 weeks Long Term Goal 1: Patient will return to highest level of independence with leisure and home activities. Long Term Goal 1 Progress: Progressing toward goal Long Term Goal 2: Patient will increase AROM in his left shoulder to 90% for increased ablility to reach for objects when completing functional activiites.   Long Term Goal 2 Progress: Met Long Term Goal 3: Patient will increase left grip strength by 40 pounds and pich strength by 10  pounds for increased ability to open containers when fishing. Long Term Goal 3 Progress: Progressing toward goal Long Term Goal 4: Patient will improve fine motor coordination in order to complete Nine Hole Peg Test in 45' or less. Long Term Goal 4 Progress: Progressing toward goal Long Term Goal 5: Patient will use left hand to zip coat/jacket/pants without physical assistance. Long Term Goal 5 Progress: Progressing toward goal  Problem List Patient Active Problem List  Diagnosis  . DIABETES  . OSTEOARTHRITIS, LOWER LEG  . OSTEOARTHRITIS, KNEE, LEFT  . JOINT EFFUSION, KNEE  . Pain in Joint, Lower Leg  . SCIATICA, RIGHT  . TOTAL KNEE FOLLOW-UP  . Pain, knee  . S/P total knee replacement  . Diabetes mellitus  . Focal motor seizure  . Embolic stroke involving middle cerebral artery  . Aspiration into respiratory tract  . Respiratory distress  . CVA (cerebral infarction)  . Muscle weakness (generalized)  . Lack of coordination  . A-fib    End of Session Activity Tolerance: Patient tolerated treatment well General Behavior During Session: [facility] for tasks performed Cognition: [facility] for tasks performed  GO  Functional Assessment Tool Used: UEFI 55/80 68% I and 32% impaired. Carrying, Moving and Handling Objects Current Status [phone]): At least 20 percent but less than 40 percent impaired, limited or restricted Carrying, Moving and Handling Objects Goal Status [phone]): At least 1 percent but less than  20 percent impaired, limited or restricted  Limmie Patricia, OTR/L  03/25/2012, 12:15 PM

## 2012-03-27 ENCOUNTER — Ambulatory Visit (HOSPITAL_COMMUNITY): Payer: BC Managed Care – PPO

## 2012-03-28 ENCOUNTER — Ambulatory Visit (HOSPITAL_COMMUNITY)
Admission: RE | Admit: 2012-03-28 | Discharge: 2012-03-28 | Disposition: A | Discharge status: Home / Self Care | Payer: Medicare Other | Source: Ambulatory Visit | Attending: Family Medicine | Admitting: Family Medicine

## 2012-03-28 ENCOUNTER — Ambulatory Visit (HOSPITAL_COMMUNITY): Payer: BC Managed Care – PPO | Admitting: Speech Pathology

## 2012-03-28 NOTE — Progress Notes (Signed)
Speech Language Pathology Treatment Patient Details  Name: Brendan Scott MRN: [id number] Date of Birth: 07/25/1945  Today's Date: 03/28/2012 Time:  - 10:15 AM    Authorization: Medicare  Authorization Time Period: 02/26/2012-2/24  Authorization Visit#:  6 of          Treatment  Cognitive-Linguistic Therapy Dysphagia Therapy Oral Motor Exercises Patient/Family Education Home Exercise Program  SLP Goals  Home Exercise SLP Goal: Patient will Perform Home Exercise Program: with supervision, verbal cues required/provided SLP Goal: Perform Home Exercise Program - Progress: Progressing toward goal SLP Short Term Goals SLP Short Term Goal 1: Pt will use left attention strategies in functional tasks with mi/mod cues. SLP Short Term Goal 1 - Progress: Progressing toward goal SLP Short Term Goal 2: Pt will implement memory strategies in functional tasks with mi/mod cues. SLP Short Term Goal 2 - Progress: Progressing toward goal SLP Short Term Goal 3: Pt will increase recall of functional information to 80% acc with use of compensatory strategies and mi/mod assist. SLP Short Term Goal 3 - Progress: Progressing toward goal SLP Short Term Goal 4: Pt will complete OMEs x10 each (labiolingual) with min cues. SLP Short Term Goal 4 - Progress: Progressing toward goal SLP Short Term Goal 5: Pt will implement speech intelligibility strategies in conversation with min cues for 100% intelligibility. SLP Short Term Goal 5 - Progress: Progressing toward goal SLP Long Term Goals SLP Long Term Goal 1: Pt will increase attention to left with independent use of strategies. SLP Long Term Goal 1 - Progress: Progressing toward goal SLP Long Term Goal 2: Pt will increase memory skills to [facility] in independent environment with use of strategies prn. SLP Long Term Goal 2 - Progress: Progressing toward goal  Assessment/Plan  Patient Active Problem List  Diagnosis  . DIABETES  . OSTEOARTHRITIS, LOWER LEG  .  OSTEOARTHRITIS, KNEE, LEFT  . JOINT EFFUSION, KNEE  . Pain in Joint, Lower Leg  . SCIATICA, RIGHT  . TOTAL KNEE FOLLOW-UP  . Pain, knee  . S/P total knee replacement  . Diabetes mellitus  . Focal motor seizure  . Embolic stroke involving middle cerebral artery  . Aspiration into respiratory tract  . Respiratory distress  . CVA (cerebral infarction)  . Muscle weakness (generalized)  . Lack of coordination  . A-fib   SLP - End of Session Activity Tolerance: Patient tolerated treatment well General Behavior During Session: [facility] for tasks performed Cognition: [facility] for tasks performed  SLP Assessment/Plan Clinical Impression Statement: Brendan Scott completed all homework and says he had no problem with it. He was able to complete OMEs with min cues using mirror for feedback. In a visual scanning/attention task, he needed max cues to start task (highlighted line down left side, paper guide to show only relevent information, and verbal cueing). He was asked to either circle or cross out certain symbols and once he grasped the task and used environmental cue, he completed with ~90% accuracy. When given cue for error awareness, he was able self correct errors. He did well with an auditory attention task, listening for the #2 in a series of numbers read aloud. He says he is anxious to be finished with therapy and wants to drive.  Driving would be unsafe at this time due to his visual inattention. He continues to work very hard. Speech Therapy Frequency: min 2x/week Duration: 4 weeks Treatment/Interventions: Compensatory techniques;SLP instruction and feedback;Patient/family education;Compensatory strategies;Functional tasks;Cueing hierarchy;Oral motor exercises;Internal/external aids;Cognitive reorganization Potential to Achieve Goals: Good  Talissa Apple 03/28/2012, 2:47 PM

## 2012-03-28 NOTE — Progress Notes (Signed)
Occupational Therapy Treatment Patient Details  Name: Brendan Scott MRN: [id number] Date of Birth: 1945/05/10  Today's Date: 03/28/2012 Time: 1026-1100 OT Time Calculation (min): 34 min Self Care 34' Visit#: 11 of 36  Re-eval: 04/22/12    Authorization: medicare  Authorization Time Period: before 20th visit   Authorization Visit#: 11 of 20  Subjective  S:  I havent practiced cleaning windows or tables at all, but that is something I do.   Pain Assessment Currently in Pain?: No/denies Pain Score: 0-No pain  Precautions/Restrictions    n/a  Exercise/Treatments Activities of Daily Living Activities of Daily Living: Brendan Scott used windex and a dry washcloth to clean our treatment table from a standing position.  He is right hand dominant.  He used his right hand to squirt the windex and his left hand to clean the table surface.  He then went into our bathroom and clean the sink and countertop, mirror, and toilet seat using his right hand to squeeze the squeeze bottle and his left hand toclean the surface.  He then took a short broom and long handled dust pan and swept up our kitchen area, using his right hand to sweep and his left hand to maneuver the dust pans.  Movements and activities had good bilateral integration and he scanned to his left without difficulty during the entire acitivty, save for the very end when the broom fell and landed in font of his left foot.  He did not step over it rather he pushed it out of the way with his left foot as he was walking.  His wife was present during the end of our session and stated that fine motor activities such as tying his shoes, fastening buttons, tearing toilet paper from a new roll, etc are all still very difficult, if not impossible for patient.  I instructed Brendan Scott to unfasten and refasten one button on his shirt.  He initated the task with both hands and struggled.  He quickly stopped using his left hand and required cuing to continue to use  his left hand with the task.  I eventually started the button through the hole so that he could complete the activity.  I then gave him a practice button cloth with 5 1" buttons.  He had max difficutlty fastening and unfastening the buttons and used both hands wtih min-mod vg to continue to incorporate his left hand.  I also cued him to pull up and out on the cloth to make more room for the button.  With this education piece, he had grater Independence with the task.    Occupational Therapy Assessment and Plan OT Assessment and Plan Clinical Impression Statement: A: Brendan Scott was able to complete work activities of washing tabletops, bathroom sink, mirror, and toilet and sweeping with good bilateral integration and good balance.  He had max difficulty fastening buttons on his shirt and on the button practice cloth positioned on the tabletop. OT Plan: P:  Improve FMC and in hand manipulation skills needed to tie his shoes, fasten buttons, tear toilet paper.   Goals Short Term Goals Time to Complete Short Term Goals: 6 weeks Short Term Goal 1: Patient will be educated and perform HEP. Short Term Goal 2: Patient will increase AROM in his left shoulder to 75% for increased ablility to reach for objects when completing functional activiites.   Short Term Goal 3: Patient will increase left grip strength by 10 pounds and pich strength by 5 pounds for  increased ability to open containers when fishing. Short Term Goal 3 Progress: Progressing toward goal Short Term Goal 4: Patient will complete Nine Hole Peg Test in standardized fashion. Short Term Goal 5: Patient will use his left hand as a gross assist with all daily activities. Long Term Goals Time to Complete Long Term Goals: 12 weeks Long Term Goal 1: Patient will return to highest level of independence with leisure and home activities. Long Term Goal 1 Progress: Progressing toward goal Long Term Goal 2: Patient will increase AROM in his left shoulder  to 90% for increased ablility to reach for objects when completing functional activiites.   Long Term Goal 3: Patient will increase left grip strength by 40 pounds and pich strength by 10  pounds for increased ability to open containers when fishing. Long Term Goal 3 Progress: Progressing toward goal Long Term Goal 4: Patient will improve fine motor coordination in order to complete Nine Hole Peg Test in 45' or less. Long Term Goal 4 Progress: Progressing toward goal Long Term Goal 5: Patient will use left hand to zip coat/jacket/pants without physical assistance. Long Term Goal 5 Progress: Progressing toward goal  Problem List Patient Active Problem List  Diagnosis  . DIABETES  . OSTEOARTHRITIS, LOWER LEG  . OSTEOARTHRITIS, KNEE, LEFT  . JOINT EFFUSION, KNEE  . Pain in Joint, Lower Leg  . SCIATICA, RIGHT  . TOTAL KNEE FOLLOW-UP  . Pain, knee  . S/P total knee replacement  . Diabetes mellitus  . Focal motor seizure  . Embolic stroke involving middle cerebral artery  . Aspiration into respiratory tract  . Respiratory distress  . CVA (cerebral infarction)  . Muscle weakness (generalized)  . Lack of coordination  . A-fib    End of Session Activity Tolerance: Patient tolerated treatment well General Behavior During Session: [facility] for tasks performed Cognition: [facility] for tasks performed OT Plan of Care OT Home Exercise Plan: practice fastening buttons  Shirlean Mylar, OTR/L  03/28/2012, 3:01 PM

## 2012-04-01 ENCOUNTER — Ambulatory Visit (HOSPITAL_COMMUNITY): Payer: BC Managed Care – PPO | Admitting: Speech Pathology

## 2012-04-01 ENCOUNTER — Ambulatory Visit (HOSPITAL_COMMUNITY)
Admission: RE | Admit: 2012-04-01 | Discharge: 2012-04-01 | Disposition: A | Discharge status: Home / Self Care | Payer: BC Managed Care – PPO | Source: Ambulatory Visit | Attending: Family Medicine | Admitting: Family Medicine

## 2012-04-01 DIAGNOSIS — G3184 Mild cognitive impairment, so stated: Secondary | ICD-10-CM | POA: Insufficient documentation

## 2012-04-01 DIAGNOSIS — M6281 Muscle weakness (generalized): Secondary | ICD-10-CM | POA: Diagnosis not present

## 2012-04-01 DIAGNOSIS — Z5189 Encounter for other specified aftercare: Secondary | ICD-10-CM | POA: Insufficient documentation

## 2012-04-01 DIAGNOSIS — R279 Unspecified lack of coordination: Secondary | ICD-10-CM | POA: Insufficient documentation

## 2012-04-01 DIAGNOSIS — I6990 Unspecified sequelae of unspecified cerebrovascular disease: Secondary | ICD-10-CM | POA: Diagnosis not present

## 2012-04-01 DIAGNOSIS — E119 Type 2 diabetes mellitus without complications: Secondary | ICD-10-CM | POA: Diagnosis not present

## 2012-04-01 DIAGNOSIS — I69928 Other speech and language deficits following unspecified cerebrovascular disease: Secondary | ICD-10-CM | POA: Diagnosis not present

## 2012-04-01 NOTE — Progress Notes (Signed)
Occupational Therapy Treatment Patient Details  Name: TRINI CHRISTIANSEN MRN: [id number] Date of Birth: 1945/12/07  Today's Date: 04/01/2012 Time: 1017-1105 OT Time Calculation (min): 48 min NM re-ed [id number] 48'  Visit#: 12 of 36  Re-eval: 04/22/12    Authorization: medicare  Authorization Time Period: before 20th visit   Authorization Visit#: 12 of 20  Subjective Symptoms/Limitations Symptoms: S: I stretch my fingers at home but 5 minutes later it seems like they are tight again. Pain Assessment Currently in Pain?: No/denies  Precautions/Restrictions  Precautions Precautions: Fall  Exercise/Treatments Hand Exercises In Hand Manipulation Training: Attempted to have patient pick up pennies and move them from finger tip to palm then back to finger tip. Unable to perform task at this time. Other Hand Exercises: Played Connect Four working on pad to pad pinch with left hand. Patient unfamiliar with game and attempted to teach patient concept. Patient's attention span was very shirt at end of session and unable to grasp concept. Other Hand Exercises: Picked up pennies from dish and placed in tootsie roll bank to increase pinch strength and coordination  Neurological Re-education Development of Reach  Reaching to Waist: seated at table patient able to use his left hand at table height to reach for dominoes and play dominoes with minimal vg to depress shoulder blade.   Fine Motor Coordination In Hand Manipulation Training: Attempted to have patient pick up pennies and move them from finger tip to palm then back to finger tip. Unable to perform task at this time.     Splinting Splinting: Re-applied two verco strips on splint. Activities of Daily Living Activities of Daily Living: Mr. Linse participated in fishing task of reeling in fish line on fishing pole when therapist casted line out. Patient up able to use open cast on fishing pole. Wife states that he only used a push cast fishing  pole.  Occupational Therapy Assessment and Plan OT Assessment and Plan Clinical Impression Statement: A: Mr. Millan needed mod vc's to use left hand during tasks. Several times when becoming frustrated he would quickly use right hand to assist. Encouraged patient to continue to work on stretching left hand and  AROM exercises. OT Plan: P:  Improve FMC and in hand manipulation skills needed to tie his shoes, fasten buttons, tear toilet paper.   Goals Short Term Goals Time to Complete Short Term Goals: 6 weeks Short Term Goal 1: Patient will be educated and perform HEP. Short Term Goal 2: Patient will increase AROM in his left shoulder to 75% for increased ablility to reach for objects when completing functional activiites.   Short Term Goal 3: Patient will increase left grip strength by 10 pounds and pich strength by 5 pounds for increased ability to open containers when fishing. Short Term Goal 3 Progress: Progressing toward goal Short Term Goal 4: Patient will complete Nine Hole Peg Test in standardized fashion. Short Term Goal 5: Patient will use his left hand as a gross assist with all daily activities. Long Term Goals Time to Complete Long Term Goals: 12 weeks Long Term Goal 1: Patient will return to highest level of independence with leisure and home activities. Long Term Goal 1 Progress: Progressing toward goal Long Term Goal 2: Patient will increase AROM in his left shoulder to 90% for increased ablility to reach for objects when completing functional activiites.   Long Term Goal 3: Patient will increase left grip strength by 40 pounds and pich strength by 10  pounds for increased ability  to open containers when fishing. Long Term Goal 3 Progress: Progressing toward goal Long Term Goal 4: Patient will improve fine motor coordination in order to complete Nine Hole Peg Test in 45' or less. Long Term Goal 4 Progress: Progressing toward goal Long Term Goal 5: Patient will use left hand to  zip coat/jacket/pants without physical assistance. Long Term Goal 5 Progress: Progressing toward goal  Problem List Patient Active Problem List  Diagnosis  . DIABETES  . OSTEOARTHRITIS, LOWER LEG  . OSTEOARTHRITIS, KNEE, LEFT  . JOINT EFFUSION, KNEE  . Pain in Joint, Lower Leg  . SCIATICA, RIGHT  . TOTAL KNEE FOLLOW-UP  . Pain, knee  . S/P total knee replacement  . Diabetes mellitus  . Focal motor seizure  . Embolic stroke involving middle cerebral artery  . Aspiration into respiratory tract  . Respiratory distress  . CVA (cerebral infarction)  . Muscle weakness (generalized)  . Lack of coordination  . A-fib    End of Session Activity Tolerance: Patient tolerated treatment well General Behavior During Session: [facility] for tasks performed Cognition: [facility] for tasks performed OT Plan of Care OT Home Exercise Plan: practice fastening buttons   Limmie Patricia, OTR/L  04/01/2012, 11:37 AM

## 2012-04-01 NOTE — Progress Notes (Signed)
Speech Language Pathology Treatment Patient Details  Name: Brendan Scott MRN: [id number] Date of Birth: 06-12-1945  Today's Date: 04/01/2012 Time: 0930-1015 SLP Time Calculation (min): 45 min  Authorization: Medicare  Authorization Time Period: 03/25/2012-04/22/2012  Authorization Visit#:  7 of     HPI:  Symptoms/Limitations Symptoms: Doing well. Pain Assessment Currently in Pain?: No/denies    Treatment  Cognitive-Linguistic Therapy Oral Motor Exercises Patient/Family Education Home Exercise Program  SLP Goals  Home Exercise SLP Goal: Patient will Perform Home Exercise Program: with supervision, verbal cues required/provided SLP Goal: Perform Home Exercise Program - Progress: Progressing toward goal SLP Short Term Goals SLP Short Term Goal 1: Pt will use left attention strategies in functional tasks with mi/mod cues. SLP Short Term Goal 1 - Progress: Progressing toward goal SLP Short Term Goal 2: Pt will implement memory strategies in functional tasks with mi/mod cues. SLP Short Term Goal 2 - Progress: Progressing toward goal SLP Short Term Goal 3: Pt will increase recall of functional information to 80% acc with use of compensatory strategies and mi/mod assist. SLP Short Term Goal 3 - Progress: Progressing toward goal SLP Short Term Goal 4: Pt will complete OMEs x10 each (labiolingual) with min cues. SLP Short Term Goal 4 - Progress: Met SLP Short Term Goal 5: Pt will implement speech intelligibility strategies in conversation with min cues for 100% intelligibility. SLP Short Term Goal 5 - Progress: Met SLP Long Term Goals SLP Long Term Goal 1: Pt will increase attention to left with independent use of strategies. SLP Long Term Goal 1 - Progress: Progressing toward goal SLP Long Term Goal 2: Pt will increase memory skills to [facility] in independent environment with use of strategies prn. SLP Long Term Goal 2 - Progress: Progressing toward goal  Assessment/Plan  Patient Active  Problem List  Diagnosis  . DIABETES  . OSTEOARTHRITIS, LOWER LEG  . OSTEOARTHRITIS, KNEE, LEFT  . JOINT EFFUSION, KNEE  . Pain in Joint, Lower Leg  . SCIATICA, RIGHT  . TOTAL KNEE FOLLOW-UP  . Pain, knee  . S/P total knee replacement  . Diabetes mellitus  . Focal motor seizure  . Embolic stroke involving middle cerebral artery  . Aspiration into respiratory tract  . Respiratory distress  . CVA (cerebral infarction)  . Muscle weakness (generalized)  . Lack of coordination  . A-fib   SLP - End of Session Activity Tolerance: Patient tolerated treatment well General Behavior During Session: [facility] for tasks performed Cognition: [facility] for tasks performed  SLP Assessment/Plan Clinical Impression Statement: Brendan Scott completed OMEs in front of mirror for feed back. Increased movement on left side (buccal) improved. He had some difficulty writing times on clock for homework. He needed max cues and point drawn at center of circle to draw 2 separate hands. In general, he did identify the time correctly. He needs frequent reminders to implement left attention strategies during functional pen/paper tasks (red line down left side, ruler to left side, etc). He required moderate cueing to locate specific items in a picture (ie. parts on the car, objects in a bathroom). Brendan Scott experienced two episodes of anterior labial spillage of saliva when he was concentrating on pen/paper task. Speech Therapy Frequency: min 2x/week Duration:  (3 weeks) Treatment/Interventions: Compensatory techniques;SLP instruction and feedback;Patient/family education;Compensatory strategies;Functional tasks;Cueing hierarchy;Oral motor exercises;Internal/external aids;Cognitive reorganization Potential to Achieve Goals: Good     Brendan Scott 04/01/2012, 1:14 PM

## 2012-04-02 DIAGNOSIS — G40919 Epilepsy, unspecified, intractable, without status epilepticus: Secondary | ICD-10-CM | POA: Diagnosis not present

## 2012-04-02 DIAGNOSIS — I6990 Unspecified sequelae of unspecified cerebrovascular disease: Secondary | ICD-10-CM | POA: Diagnosis not present

## 2012-04-02 DIAGNOSIS — Z79899 Other long term (current) drug therapy: Secondary | ICD-10-CM | POA: Diagnosis not present

## 2012-04-03 ENCOUNTER — Ambulatory Visit (HOSPITAL_COMMUNITY): Payer: BC Managed Care – PPO | Admitting: Specialist

## 2012-04-03 ENCOUNTER — Ambulatory Visit (HOSPITAL_COMMUNITY)
Admission: RE | Admit: 2012-04-03 | Discharge: 2012-04-03 | Disposition: A | Discharge status: Home / Self Care | Payer: BC Managed Care – PPO | Source: Ambulatory Visit | Attending: Family Medicine | Admitting: Family Medicine

## 2012-04-03 DIAGNOSIS — G3184 Mild cognitive impairment, so stated: Secondary | ICD-10-CM | POA: Diagnosis not present

## 2012-04-03 DIAGNOSIS — I69928 Other speech and language deficits following unspecified cerebrovascular disease: Secondary | ICD-10-CM | POA: Diagnosis not present

## 2012-04-03 DIAGNOSIS — Z5189 Encounter for other specified aftercare: Secondary | ICD-10-CM | POA: Diagnosis not present

## 2012-04-03 DIAGNOSIS — R279 Unspecified lack of coordination: Secondary | ICD-10-CM | POA: Diagnosis not present

## 2012-04-03 DIAGNOSIS — I6990 Unspecified sequelae of unspecified cerebrovascular disease: Secondary | ICD-10-CM | POA: Diagnosis not present

## 2012-04-03 DIAGNOSIS — M6281 Muscle weakness (generalized): Secondary | ICD-10-CM | POA: Diagnosis not present

## 2012-04-03 NOTE — Progress Notes (Signed)
Occupational Therapy Treatment Patient Details  Name: Brendan Scott MRN: [id number] Date of Birth: 16-Aug-1945  Today's Date: 04/03/2012 Time: 0865-7846 OT Time Calculation (min): 43 min Manual Therapy [phone] 10' Neuro reeducation [phone] 86' Visit#: 13 of 36  Re-eval: 04/22/12    Authorization: medicare  Authorization Time Period: before 20th visit   Authorization Visit#: 13 of 20  Subjective S:  No one said therapy was going to be easy.  Precautions/Restrictions   fall  Exercise/Treatments Neurological Re-education Exercises Sponges: able to pick up a max of 5 sponges individually and translate to his palm.  He then picked up 3 sponges at a time, held them in his palm, and then attempted to translate the sponge back to a pincer grasp with max difficulty.     Grasp and Release Grasp and Release:  (gross grasp and release x 10 reps.)  Fine Motor Coordination In Hand Manipulation Training: focus of treatment today is in hand manipulation training, OTR/L demonstrated translating golf ball from fingertips to palm and palm to fingertips with his left hand.  Patient had max difficulty with this task.  Attempted rotating 2 golf balls in his hand, unable to do so.  I gave patient a golf ball to take home to continue practicing.   Digit Composite Abduction: AROM;10 reps Digit Composite Adduction: AROM;10 reps Opposition: AROM;5 reps     Manual Therapy Manual Therapy: Myofascial release Myofascial Release: MFR, manual stretching, and gentle joint mobs to left wrist, hand, and digits with passive stretching into flexion and extension.  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  I then instructed him to ball the strips of paper up using only his left hand, which was also moderate-max difficult for  patient.  Patient ended session playing dominoes to work on left hand functional use and scanning to left.    Occupational Therapy Assessment and Plan OT Assessment and Plan Clinical Impression Statement: A:  Patient has max difficulty with in hand manipulation activities.   OT Plan: P:  Continue to work on building in Scientist, physiological, translation skills, tying shoes, tearing toilet paper.  Follow up on HEP of use of golf ball for in hand translation.     Goals    Problem List Patient Active Problem List  Diagnosis  . DIABETES  . OSTEOARTHRITIS, LOWER LEG  . OSTEOARTHRITIS, KNEE, LEFT  . JOINT EFFUSION, KNEE  . Pain in Joint, Lower Leg  . SCIATICA, RIGHT  . TOTAL KNEE FOLLOW-UP  . Pain, knee  . S/P total knee replacement  . Diabetes mellitus  . Focal motor seizure  . Embolic stroke involving middle cerebral artery  . Aspiration into respiratory tract  . Respiratory distress  . CVA (cerebral infarction)  . Muscle weakness (generalized)  . Lack of coordination  . A-fib    End of Session Activity Tolerance: Patient tolerated treatment well General Behavior During Session: [facility] for tasks performed Cognition: [facility] for tasks performed OT Plan of Care OT Home Exercise Plan: issued a golf ball to practice in hand translation and in hand manipulation tasks.    Shirlean Mylar, OTR/L    04/03/2012, 10:21 AM

## 2012-04-04 ENCOUNTER — Ambulatory Visit (HOSPITAL_COMMUNITY): Payer: BC Managed Care – PPO | Admitting: Speech Pathology

## 2012-04-04 ENCOUNTER — Ambulatory Visit (HOSPITAL_COMMUNITY)
Admission: RE | Admit: 2012-04-04 | Discharge: 2012-04-04 | Disposition: A | Discharge status: Home / Self Care | Payer: BC Managed Care – PPO | Source: Ambulatory Visit | Attending: Family Medicine | Admitting: Family Medicine

## 2012-04-04 DIAGNOSIS — M6281 Muscle weakness (generalized): Secondary | ICD-10-CM | POA: Diagnosis not present

## 2012-04-04 DIAGNOSIS — I6990 Unspecified sequelae of unspecified cerebrovascular disease: Secondary | ICD-10-CM | POA: Diagnosis not present

## 2012-04-04 DIAGNOSIS — G3184 Mild cognitive impairment, so stated: Secondary | ICD-10-CM | POA: Diagnosis not present

## 2012-04-04 DIAGNOSIS — Z5189 Encounter for other specified aftercare: Secondary | ICD-10-CM | POA: Diagnosis not present

## 2012-04-04 DIAGNOSIS — R279 Unspecified lack of coordination: Secondary | ICD-10-CM | POA: Diagnosis not present

## 2012-04-04 DIAGNOSIS — I69928 Other speech and language deficits following unspecified cerebrovascular disease: Secondary | ICD-10-CM | POA: Diagnosis not present

## 2012-04-04 NOTE — Progress Notes (Signed)
Occupational Therapy Treatment Patient Details  Name: Brendan Scott MRN: [id number] Date of Birth: 1945/03/01  Today's Date: 04/04/2012 Time: 1016-1100 OT Time Calculation (min): 44 min MFR 1016-1030 14' NM re-ed 1030-1100 30'  Visit#: 14 of 36  Re-eval: 04/22/12    Authorization: medicare  Authorization Time Period: before 20th visit   Authorization Visit#: 14 of 20  Subjective Symptoms/Limitations Symptoms: S: I've been working with the golf ball at home. Pain Assessment Currently in Pain?: No/denies  Precautions/Restrictions  Precautions Precautions: Fall  Exercise/Treatments Hand Exercises MCPJ Flexion: PROM;10 reps MCPJ Extension: PROM;10 reps PIPJ Flexion: PROM;10 reps PIPJ Extension: PROM;10 reps DIPJ Flexion: PROM;10 reps DIPJ Extension: PROM;10 reps Digit Composite Abduction: AROM;10 reps Digit Composite Adduction: AROM;10 reps Opposition: AROM;10 reps Thumb Opposition: PROM, AROM x10 In Hand Manipulation Training: Worked with connect four discs to increase in hand manipulation. practiced transferring disc from finger tips to palm and back. max difficutly. able to complete 2x.  Other Hand Exercises: Completed pad to pad pinch with thumb to each digit excluding small finger. x5 holding for 5" each.  Neurological Re-education Exercises Wrist Flexion: Seated;PROM;10 reps Wrist Extension: Seated;PROM;10 reps Wrist Radial Deviation: Seated;PROM;10 reps Wrist Ulnar Deviation: Seated;PROM;10 reps  Weight Bearing Exercises Weight Bearing Position: Morey Hummingbird with weight on hand: On mat table completed tic tac toe game on mirror while weightbearing on left hand for approx. 3' tolerated. Patient's LUE fatigued very quickly. Attempted to have patient wipe marker off mirror while weightbearing on LUE but pt was unable due to increased fatigue in LUE.  Development of Reach     Grasp and Release Thumb Opposition: PROM, AROM x10  Fine Motor Coordination Digit  Composite Abduction: AROM;10 reps Digit Composite Adduction: AROM;10 reps Opposition: AROM;10 reps     Manual Therapy Manual Therapy: Myofascial release Myofascial Release: MFR, manual stretching, and gentle joint mobs to left wrist, hand, and digits with passive stretching into flexion and extension.   Occupational Therapy Assessment and Plan OT Assessment and Plan Clinical Impression Statement: A: Pt continues to have max difficulty with in hand manipulation tasks. Pt states that he is performing HEP with golf ball at home successfully.  OT Plan: P:  Continue to work on building in Scientist, physiological, translation skills, tying shoes, tearing toilet paper.  Work on LUE weightbearing activities.    Goals Short Term Goals Time to Complete Short Term Goals: 6 weeks Short Term Goal 1: Patient will be educated and perform HEP. Short Term Goal 2: Patient will increase AROM in his left shoulder to 75% for increased ablility to reach for objects when completing functional activiites.   Short Term Goal 3: Patient will increase left grip strength by 10 pounds and pich strength by 5 pounds for increased ability to open containers when fishing. Short Term Goal 3 Progress: Progressing toward goal Short Term Goal 4: Patient will complete Nine Hole Peg Test in standardized fashion. Short Term Goal 5: Patient will use his left hand as a gross assist with all daily activities. Long Term Goals Time to Complete Long Term Goals: 12 weeks Long Term Goal 1: Patient will return to highest level of independence with leisure and home activities. Long Term Goal 1 Progress: Progressing toward goal Long Term Goal 2: Patient will increase AROM in his left shoulder to 90% for increased ablility to reach for objects when completing functional activiites.   Long Term Goal 2 Progress: Met Long Term Goal 3: Patient will increase left grip strength by 40 pounds  and pich strength by 10  pounds for increased ability to open  containers when fishing. Long Term Goal 3 Progress: Progressing toward goal Long Term Goal 4: Patient will improve fine motor coordination in order to complete Nine Hole Peg Test in 45' or less. Long Term Goal 4 Progress: Progressing toward goal Long Term Goal 5: Patient will use left hand to zip coat/jacket/pants without physical assistance. Long Term Goal 5 Progress: Progressing toward goal  Problem List Patient Active Problem List  Diagnosis  . DIABETES  . OSTEOARTHRITIS, LOWER LEG  . OSTEOARTHRITIS, KNEE, LEFT  . JOINT EFFUSION, KNEE  . Pain in Joint, Lower Leg  . SCIATICA, RIGHT  . TOTAL KNEE FOLLOW-UP  . Pain, knee  . S/P total knee replacement  . Diabetes mellitus  . Focal motor seizure  . Embolic stroke involving middle cerebral artery  . Aspiration into respiratory tract  . Respiratory distress  . CVA (cerebral infarction)  . Muscle weakness (generalized)  . Lack of coordination  . A-fib    End of Session Activity Tolerance: Patient tolerated treatment well General Behavior During Session: [facility] for tasks performed Cognition: [facility] for tasks performed   Limmie Patricia, OTR/L  04/04/2012, 11:22 AM

## 2012-04-04 NOTE — Progress Notes (Signed)
Speech Language Pathology Treatment Patient Details  Name: Brendan Scott MRN: [id number] Date of Birth: 17-Dec-1945  Today's Date: 04/04/2012 Time: 0930-1015 SLP Time Calculation (min): 45 min  Authorization: Medicare  Authorization Time Period: 03/25/2012-04/22/2012  Authorization Visit#:  8 of     HPI:  Symptoms/Limitations Symptoms: "how are you doing?" Pain Assessment Currently in Pain?: No/denies   Treatment  Cognitive-Linguistic Therapy Oral Motor Exercises Patient/Family Education Home Exercise Program  SLP Goals  Home Exercise SLP Goal: Patient will Perform Home Exercise Program: with supervision, verbal cues required/provided SLP Goal: Perform Home Exercise Program - Progress: Progressing toward goal SLP Short Term Goals SLP Short Term Goal 1: Pt will use left attention strategies in functional tasks with mi/mod cues. SLP Short Term Goal 1 - Progress: Progressing toward goal SLP Short Term Goal 2: Pt will implement memory strategies in functional tasks with mi/mod cues. SLP Short Term Goal 2 - Progress: Progressing toward goal SLP Short Term Goal 3: Pt will increase recall of functional information to 80% acc with use of compensatory strategies and mi/mod assist. SLP Short Term Goal 3 - Progress: Progressing toward goal SLP Short Term Goal 4: Pt will complete OMEs x10 each (labiolingual) with min cues. SLP Short Term Goal 4 - Progress: Progressing toward goal SLP Short Term Goal 5: Pt will implement speech intelligibility strategies in conversation with min cues for 100% intelligibility. SLP Short Term Goal 5 - Progress: Progressing toward goal SLP Long Term Goals SLP Long Term Goal 1: Pt will increase attention to left with independent use of strategies. SLP Long Term Goal 1 - Progress: Progressing toward goal SLP Long Term Goal 2: Pt will increase memory skills to [facility] in independent environment with use of strategies prn. SLP Long Term Goal 2 - Progress: Progressing  toward goal  Assessment/Plan  Patient Active Problem List  Diagnosis  . DIABETES  . OSTEOARTHRITIS, LOWER LEG  . OSTEOARTHRITIS, KNEE, LEFT  . JOINT EFFUSION, KNEE  . Pain in Joint, Lower Leg  . SCIATICA, RIGHT  . TOTAL KNEE FOLLOW-UP  . Pain, knee  . S/P total knee replacement  . Diabetes mellitus  . Focal motor seizure  . Embolic stroke involving middle cerebral artery  . Aspiration into respiratory tract  . Respiratory distress  . CVA (cerebral infarction)  . Muscle weakness (generalized)  . Lack of coordination  . A-fib   SLP - End of Session Activity Tolerance: Patient tolerated treatment well General Behavior During Session: [facility] for tasks performed Cognition: [facility] for tasks performed  SLP Assessment/Plan Clinical Impression Statement: Brendan Scott continues to have mild wet vocal quality and needs cues to clear and swallow, swallow more frequently. He needed min cues to draw a red line down the left side of paper during pen/paper tasks to compensate for left inattention. His awareness of his inattention to the left is only minimal it seems. The severity of his left inattention during pen/paper tasks is mild/moderately impaired. He is anxious to return to work and drive again. I do not see that driving any time in the near future would be safe due to the aforementioned deficits and decreased awareness of its implications. Speech Therapy Frequency: min 2x/week Duration:  (2 weeks) Treatment/Interventions: Compensatory techniques;SLP instruction and feedback;Patient/family education;Compensatory strategies;Functional tasks;Cueing hierarchy;Oral motor exercises;Internal/external aids;Cognitive reorganization Potential to Achieve Goals: Good  Thank you,  Havery Moros, CCC-SLP [phone]     [name],[name] 04/04/2012, 10:26 AM

## 2012-04-08 ENCOUNTER — Ambulatory Visit (HOSPITAL_COMMUNITY)
Admission: RE | Admit: 2012-04-08 | Discharge: 2012-04-08 | Disposition: A | Discharge status: Home / Self Care | Payer: BC Managed Care – PPO | Source: Ambulatory Visit | Attending: Family Medicine | Admitting: Family Medicine

## 2012-04-08 ENCOUNTER — Ambulatory Visit (HOSPITAL_COMMUNITY): Payer: BC Managed Care – PPO | Admitting: Speech Pathology

## 2012-04-08 DIAGNOSIS — I69928 Other speech and language deficits following unspecified cerebrovascular disease: Secondary | ICD-10-CM | POA: Diagnosis not present

## 2012-04-08 DIAGNOSIS — G3184 Mild cognitive impairment, so stated: Secondary | ICD-10-CM | POA: Diagnosis not present

## 2012-04-08 DIAGNOSIS — Z5189 Encounter for other specified aftercare: Secondary | ICD-10-CM | POA: Diagnosis not present

## 2012-04-08 DIAGNOSIS — M6281 Muscle weakness (generalized): Secondary | ICD-10-CM | POA: Diagnosis not present

## 2012-04-08 DIAGNOSIS — I6990 Unspecified sequelae of unspecified cerebrovascular disease: Secondary | ICD-10-CM | POA: Diagnosis not present

## 2012-04-08 DIAGNOSIS — R279 Unspecified lack of coordination: Secondary | ICD-10-CM | POA: Diagnosis not present

## 2012-04-08 NOTE — Progress Notes (Signed)
Occupational Therapy Treatment Patient Details  Name: Brendan Scott MRN: [id number] Date of Birth: April 04, 1945  Today's Date: 04/08/2012 Time: 1025-1115 OT Time Calculation (min): 50 min Neuroreeducation 50' Visit#: 15 of 36  Re-eval: 04/22/12    Authorization: medicare  Authorization Time Period: before 20th visit   Authorization Visit#: 15 of 20  Subjective S: I guess I need to practice tying my shoe more.   Pain Assessment Currently in Pain?: No/denies Pain Score: 0-No pain  Precautions/Restrictions   left side neglect  Exercise/Treatments Cognitive Exercises Copy this Image: OTR/L used colored foam cubes to produce patterns, positioned pattern to patients left, and asked him to reproduce the pattern.  Brendan Scott required an extended amount of time to determine what he was supposed to be doing. Initially, he added red blocks to the end of my blocks.  I instructed him to copy my block pattern exactly as he saw it, directly in front of him on the table.  Slowly, he was able to copy it, with one error, when he asked how he did, I turned the question back to him.  I instructed him to go through the blocks individually to see if his pattern matched mine.  Once he followed these instructions, he found and corrected his error.  The second pattern had a few blocks that were double stacked.  he recreated this pattern with a quicker pace and without errors or cuing.  The third pattern was the most difficult and he required mod vg to get the blocks aligned correctly.    Neurological Re-education Exercises    Weight Bearing Exercises Standing with weight shifting on and off: stood at elevated table weigthbearing on LUE with extended, wrist, digits, and elbow.  while seperating foam blocks into appropriate color groups x 10', focus of weightbearing is to decrease flexor tone in his digits.    Fine Motor Coordination In Hand Manipulation Training: using colored foam blocks, he copied 3 seperate  patterns/formations that the therapist created.  He is able to manipulate individual blocks with relative ease, however, has tremors that make stacking blocks difficult. Digiticizer: 3.0 resist with all digits simultaneously with minimal difficulty.  Attempted isolated digit strengthening and had max difficulty doing so.  I introduced a spray bottle, and patient was able to use his index and long digits to depress nozzle without difficulty to spray cleaner on the table.       Activities of Daily Living Activities of Daily Living: Patient tore strips of papertowel into several pieces with his left hand, in prep for tearing papertowel and toiletpaper at work.  He had minimal difficulty with tearing the paper, when instructed to ball the papertowel into balls with his left hand , he had max difficulty due to not being able to individually move his digits, rather, they tend to move as one unit.  As a result, I introduced the digitcizer to improve individual digit movements.  Brendan Scott attempted to tie his left shoe, and was not able to do so with the shoe on the ground,  I feel he had difficulty due to positioning and because he was skipping the first knot/base knot.  I gave him a shoe to practice with on the table top, and he still had difficulty due to missing the first knot.  I instructed him to make the first knot, he was able to successfully tie his shoe.    Occupational Therapy Assessment and Plan OT Assessment and Plan Clinical Impression Statement: A:  Patient able to tear paper with increased I.  He continues to have difficulty balling paper due to difficulty individually flexing and extending his digits. OT Plan: P:  Increase I with tying shoes and with copying patterns to improve his left side attention.     Goals Short Term Goals Time to Complete Short Term Goals: 6 weeks Short Term Goal 1: Patient will be educated and perform HEP. Short Term Goal 2: Patient will increase AROM in his left  shoulder to 75% for increased ablility to reach for objects when completing functional activiites.   Short Term Goal 3: Patient will increase left grip strength by 10 pounds and pich strength by 5 pounds for increased ability to open containers when fishing. Short Term Goal 4: Patient will complete Nine Hole Peg Test in standardized fashion. Short Term Goal 5: Patient will use his left hand as a gross assist with all daily activities. Long Term Goals Time to Complete Long Term Goals: 12 weeks Long Term Goal 1: Patient will return to highest level of independence with leisure and home activities. Long Term Goal 1 Progress: Progressing toward goal Long Term Goal 2: Patient will increase AROM in his left shoulder to 90% for increased ablility to reach for objects when completing functional activiites.   Long Term Goal 3: Patient will increase left grip strength by 40 pounds and pich strength by 10  pounds for increased ability to open containers when fishing. Long Term Goal 3 Progress: Progressing toward goal Long Term Goal 4: Patient will improve fine motor coordination in order to complete Nine Hole Peg Test in 45' or less. Long Term Goal 4 Progress: Progressing toward goal Long Term Goal 5: Patient will use left hand to zip coat/jacket/pants without physical assistance. Long Term Goal 5 Progress: Progressing toward goal  Problem List Patient Active Problem List  Diagnosis  . DIABETES  . OSTEOARTHRITIS, LOWER LEG  . OSTEOARTHRITIS, KNEE, LEFT  . JOINT EFFUSION, KNEE  . Pain in Joint, Lower Leg  . SCIATICA, RIGHT  . TOTAL KNEE FOLLOW-UP  . Pain, knee  . S/P total knee replacement  . Diabetes mellitus  . Focal motor seizure  . Embolic stroke involving middle cerebral artery  . Aspiration into respiratory tract  . Respiratory distress  . CVA (cerebral infarction)  . Muscle weakness (generalized)  . Lack of coordination  . A-fib    End of Session Activity Tolerance: Patient  tolerated treatment well General Behavior During Session: [facility] for tasks performed Cognition: [facility] for tasks performed  Shirlean Mylar, OTR/L  04/08/2012, 11:34 AM

## 2012-04-08 NOTE — Progress Notes (Signed)
Speech Language Pathology Treatment Patient Details  Name: Brendan Scott MRN: [id number] Date of Birth: 06-22-1945  Today's Date: 04/08/2012 Time: 1478-2956 SLP Time Calculation (min): 46 min  Authorization: Medicare  Authorization Time Period: 03/25/2012-04/22/2012  Authorization Visit#:  9 of   16  HPI:  Symptoms/Limitations Symptoms: "If it helps me get to driving, I'll do it." Pain Assessment Currently in Pain?: No/denies    Treatment  Cognitive-Linguistic Therapy Oral Motor Exercises Patient/Family Education Home Exercise Program  SLP Goals  Home Exercise SLP Goal: Patient will Perform Home Exercise Program: with supervision, verbal cues required/provided SLP Goal: Perform Home Exercise Program - Progress: Progressing toward goal SLP Short Term Goals SLP Short Term Goal 1: Pt will use left attention strategies in functional tasks with mi/mod cues. SLP Short Term Goal 1 - Progress: Met (change to min cues) SLP Short Term Goal 2: Pt will implement memory strategies in functional tasks with mi/mod cues. SLP Short Term Goal 2 - Progress: Met (change to min cues) SLP Short Term Goal 3: Pt will increase recall of functional information to 80% acc with use of compensatory strategies and mi/mod assist. SLP Short Term Goal 3 - Progress: Met (change to min cues) SLP Short Term Goal 4: Pt will complete OMEs x10 each (labiolingual) with min cues. SLP Short Term Goal 4 - Progress: Met (D/C goal) SLP Short Term Goal 5: Pt will implement speech intelligibility strategies in conversation with min cues for 100% intelligibility. SLP Short Term Goal 5 - Progress: Met SLP Long Term Goals SLP Long Term Goal 1: Pt will increase attention to left with independent use of strategies. SLP Long Term Goal 1 - Progress: Progressing toward goal SLP Long Term Goal 2: Pt will increase memory skills to [facility] in independent environment with use of strategies prn. SLP Long Term Goal 2 - Progress: Progressing  toward goal  Assessment/Plan  Patient Active Problem List  Diagnosis  . DIABETES  . OSTEOARTHRITIS, LOWER LEG  . OSTEOARTHRITIS, KNEE, LEFT  . JOINT EFFUSION, KNEE  . Pain in Joint, Lower Leg  . SCIATICA, RIGHT  . TOTAL KNEE FOLLOW-UP  . Pain, knee  . S/P total knee replacement  . Diabetes mellitus  . Focal motor seizure  . Embolic stroke involving middle cerebral artery  . Aspiration into respiratory tract  . Respiratory distress  . CVA (cerebral infarction)  . Muscle weakness (generalized)  . Lack of coordination  . A-fib   SLP - End of Session Activity Tolerance: Patient tolerated treatment well General Behavior During Session: [facility] for tasks performed Cognition: [facility] for tasks performed  SLP Assessment/Plan Clinical Impression Statement: Mr. Ishman completed his homework (connecting dots in alphabetical order). In session, he completed a nunber trails task (1-12) independently in a timely fashion. During 10 item recall task with cues for recall, he was able to state 5/10 words on first trial, 6/10 on second trial, 7/10 on third, and 9/10 on fourth repetition. After that, he was able to recall 10/10 words with no cues given. Functionally, in pen/paper tasks he omitted 3/20 written questions due to decreased visual attention, however when told he missed three problems he was able to go back and locate the ones not completed. He continues to make great strides in memory and movement on left side of face, but decreased attention to left is problematic (especially because he wants so badly to drive and return to work). Speech Therapy Frequency: min 2x/week Duration: 2 weeks (3 weeks) Treatment/Interventions: Compensatory techniques;SLP instruction  and feedback;Patient/family education;Compensatory strategies;Functional tasks;Cueing hierarchy;Oral motor exercises;Internal/external aids;Cognitive reorganization Potential to Achieve Goals: Good     [name],[name] 04/08/2012, 10:31 AM

## 2012-04-10 ENCOUNTER — Ambulatory Visit (HOSPITAL_COMMUNITY): Payer: BC Managed Care – PPO

## 2012-04-10 ENCOUNTER — Ambulatory Visit (HOSPITAL_COMMUNITY)
Admission: RE | Admit: 2012-04-10 | Discharge: 2012-04-10 | Disposition: A | Discharge status: Home / Self Care | Payer: BC Managed Care – PPO | Source: Ambulatory Visit

## 2012-04-10 DIAGNOSIS — Z5189 Encounter for other specified aftercare: Secondary | ICD-10-CM | POA: Diagnosis not present

## 2012-04-10 DIAGNOSIS — I69928 Other speech and language deficits following unspecified cerebrovascular disease: Secondary | ICD-10-CM | POA: Diagnosis not present

## 2012-04-10 DIAGNOSIS — I6990 Unspecified sequelae of unspecified cerebrovascular disease: Secondary | ICD-10-CM | POA: Diagnosis not present

## 2012-04-10 DIAGNOSIS — R279 Unspecified lack of coordination: Secondary | ICD-10-CM | POA: Diagnosis not present

## 2012-04-10 DIAGNOSIS — G3184 Mild cognitive impairment, so stated: Secondary | ICD-10-CM | POA: Diagnosis not present

## 2012-04-10 DIAGNOSIS — M6281 Muscle weakness (generalized): Secondary | ICD-10-CM | POA: Diagnosis not present

## 2012-04-10 NOTE — Progress Notes (Signed)
Occupational Therapy Treatment Patient Details  Name: Brendan Scott MRN: [id number] Date of Birth: 20-Feb-1945  Today's Date: 04/10/2012 Time: 4098-1191 OT Time Calculation (min): 42 min Cog skills [phone] 42'   Visit#: 16 of 36  Re-eval: 04/22/12    Authorization: medicare  Authorization Time Period: before 20th visit   Authorization Visit#: 16 of 20  Subjective Symptoms/Limitations Symptoms: S: I was practicing tying my shoes. I'm going to go home and tie my shoes and it'll be perfect when I come back. Pain Assessment Currently in Pain?: No/denies  Precautions/Restrictions     Exercise/Treatments Cognitive Exercises Copy this Image: Pt completed Pattern worksheet. Pt looked at individual pattern on left side of paper and required to copy image. 50% of copied images were not exact due to increased speed when completing. When reviewing work pt graded self and with prompts was able to determine what he could have done to make the images more legible and neat. "I could slow down." Next worksheet: pt was allowed to look at pattern on lt side, it was covered and pt was required to copy image from memory on right side. Pt was allowed 30 sec to look at image before it was covered and he chose to look at image for 5 seconds which was reflected in his work. 50% of images were not correctly copied. 3rd worksheet: Pt was given a word of a shape and a portion of the shape was started. pt was required finish shape. Max difficulty with this task. Pt was unable to read 25% of the words; he says because his left eye is bad. Pt was instructed to use his right eye for his vision. Pt only completed two images correctly with max cues.      Activities of Daily Living Activities of Daily Living: Practicing tying shoes this date. Greatest difficulty with first step (tying a knot). Pt consistantly twisted shoelaces versus tying a knot. Able to tie knot with max vc's for sequencing. Overall, pt did not wait  for full directions before initiating task. When using the Teach Back method, therapist asked him what was the first step he needed to complete when tying his shoes. Pt did not verbalize answer instead he demonstrated it.   Occupational Therapy Assessment and Plan OT Assessment and Plan Clinical Impression Statement: A: Pt had increased difficulty with shoe tying task. pt had intial difficutly, but with cues for each step completed successfully. Once cues were taken away, pt had max difficulty completing task.  OT Plan: P:  Increase I with tying shoes and with copying patterns to improve his left side attention.     Goals Short Term Goals Time to Complete Short Term Goals: 6 weeks Short Term Goal 1: Patient will be educated and perform HEP. Short Term Goal 2: Patient will increase AROM in his left shoulder to 75% for increased ablility to reach for objects when completing functional activiites.   Short Term Goal 3: Patient will increase left grip strength by 10 pounds and pich strength by 5 pounds for increased ability to open containers when fishing. Short Term Goal 3 Progress: Progressing toward goal Short Term Goal 4: Patient will complete Nine Hole Peg Test in standardized fashion. Short Term Goal 5: Patient will use his left hand as a gross assist with all daily activities. Long Term Goals Time to Complete Long Term Goals: 12 weeks Long Term Goal 1: Patient will return to highest level of independence with leisure and home activities. Long Term Goal  1 Progress: Progressing toward goal Long Term Goal 2: Patient will increase AROM in his left shoulder to 90% for increased ablility to reach for objects when completing functional activiites.   Long Term Goal 3: Patient will increase left grip strength by 40 pounds and pich strength by 10  pounds for increased ability to open containers when fishing. Long Term Goal 3 Progress: Progressing toward goal Long Term Goal 4: Patient will improve fine  motor coordination in order to complete Nine Hole Peg Test in 45' or less. Long Term Goal 4 Progress: Progressing toward goal Long Term Goal 5: Patient will use left hand to zip coat/jacket/pants without physical assistance. Long Term Goal 5 Progress: Progressing toward goal  Problem List Patient Active Problem List  Diagnosis  . DIABETES  . OSTEOARTHRITIS, LOWER LEG  . OSTEOARTHRITIS, KNEE, LEFT  . JOINT EFFUSION, KNEE  . Pain in Joint, Lower Leg  . SCIATICA, RIGHT  . TOTAL KNEE FOLLOW-UP  . Pain, knee  . S/P total knee replacement  . Diabetes mellitus  . Focal motor seizure  . Embolic stroke involving middle cerebral artery  . Aspiration into respiratory tract  . Respiratory distress  . CVA (cerebral infarction)  . Muscle weakness (generalized)  . Lack of coordination  . A-fib    End of Session Activity Tolerance: Patient tolerated treatment well General Behavior During Session: [facility] for tasks performed Cognition: [facility] for tasks performed   Limmie Patricia, OTR/L  04/10/2012, 12:17 PM

## 2012-04-11 ENCOUNTER — Ambulatory Visit (HOSPITAL_COMMUNITY)
Admission: RE | Admit: 2012-04-11 | Discharge: 2012-04-11 | Disposition: A | Discharge status: Home / Self Care | Payer: BC Managed Care – PPO | Source: Ambulatory Visit | Attending: Family Medicine | Admitting: Family Medicine

## 2012-04-11 ENCOUNTER — Ambulatory Visit (HOSPITAL_COMMUNITY): Payer: BC Managed Care – PPO | Admitting: Speech Pathology

## 2012-04-11 DIAGNOSIS — M6281 Muscle weakness (generalized): Secondary | ICD-10-CM | POA: Diagnosis not present

## 2012-04-11 DIAGNOSIS — I6990 Unspecified sequelae of unspecified cerebrovascular disease: Secondary | ICD-10-CM | POA: Diagnosis not present

## 2012-04-11 DIAGNOSIS — R279 Unspecified lack of coordination: Secondary | ICD-10-CM | POA: Diagnosis not present

## 2012-04-11 DIAGNOSIS — G3184 Mild cognitive impairment, so stated: Secondary | ICD-10-CM | POA: Diagnosis not present

## 2012-04-11 DIAGNOSIS — Z5189 Encounter for other specified aftercare: Secondary | ICD-10-CM | POA: Diagnosis not present

## 2012-04-11 DIAGNOSIS — I69928 Other speech and language deficits following unspecified cerebrovascular disease: Secondary | ICD-10-CM | POA: Diagnosis not present

## 2012-04-11 NOTE — Progress Notes (Signed)
Occupational Therapy Treatment Patient Details  Name: Brendan Scott MRN: [id number] Date of Birth: 02-17-1945  Today's Date: 04/11/2012 Time: 1020-1100 OT Time Calculation (min): 40 min Neuro reeducation 40' Visit#: 17 of 36  Re-eval: 04/22/12    Authorization: medicare  Authorization Time Period: before 20th visit   Authorization Visit#: 17 of 20  Subjective  S:  I really want to work this hand.   Pain Assessment Currently in Pain?: No/denies  Precautions/Restrictions   left side neglect  Exercise/Treatments Hand Exercises Theraputty - Flatten: pink Theraputty - Roll: pink Theraputty - Grip: pink Other Hand Exercises: hand gripper wtih 10# resistance x 25 reps, 20# resistance x 25 reps, 30# resistance x 25 reps.   Other Hand Exercises: functional task of opening and closing a cream jar and a pill bottle. pateint attempted to use his right hand to twist the base while his left hand was on the top of the bottle/jar.  I stabiized the jars and had him twist them with his left hand.  He required vg to twist to left.  he was able to do so and had moderate difficulty, He was not able to depress the safety lock on the pill bottle  Cognitive Exercises Copy this Image: OTR/L made a 5 peg design in the pink putty and asked the patient to copy the design with step by step vg to complete.  We repeated with 2 different designs, and he was able to reproduce the design without difficulty.    Neurological Re-education Exercises    Weight Bearing Exercises Standing with weight shifting on and off: stood at elevated table to flatten pink tputty with left hand           Occupational Therapy Assessment and Plan OT Assessment and Plan Clinical Impression Statement: A:  Patient had difficulty opening containers when twisting the cap with his left hand was involved.  He had to be cued to twist the cap to the left to loosen.   OT Plan: P:  Increase independence with visual scanning during  functional tasks.     Goals Short Term Goals Time to Complete Short Term Goals: 6 weeks Short Term Goal 1: Patient will be educated and perform HEP. Short Term Goal 2: Patient will increase AROM in his left shoulder to 75% for increased ablility to reach for objects when completing functional activiites.   Short Term Goal 3: Patient will increase left grip strength by 10 pounds and pich strength by 5 pounds for increased ability to open containers when fishing. Short Term Goal 4: Patient will complete Nine Hole Peg Test in standardized fashion. Short Term Goal 5: Patient will use his left hand as a gross assist with all daily activities. Long Term Goals Time to Complete Long Term Goals: 12 weeks Long Term Goal 1: Patient will return to highest level of independence with leisure and home activities. Long Term Goal 2: Patient will increase AROM in his left shoulder to 90% for increased ablility to reach for objects when completing functional activiites.   Long Term Goal 3: Patient will increase left grip strength by 40 pounds and pich strength by 10  pounds for increased ability to open containers when fishing. Long Term Goal 4: Patient will improve fine motor coordination in order to complete Nine Hole Peg Test in 55' or less. Long Term Goal 5: Patient will use left hand to zip coat/jacket/pants without physical assistance.  Problem List Patient Active Problem List  Diagnosis  . DIABETES  .  OSTEOARTHRITIS, LOWER LEG  . OSTEOARTHRITIS, KNEE, LEFT  . JOINT EFFUSION, KNEE  . Pain in Joint, Lower Leg  . SCIATICA, RIGHT  . TOTAL KNEE FOLLOW-UP  . Pain, knee  . S/P total knee replacement  . Diabetes mellitus  . Focal motor seizure  . Embolic stroke involving middle cerebral artery  . Aspiration into respiratory tract  . Respiratory distress  . CVA (cerebral infarction)  . Muscle weakness (generalized)  . Lack of coordination  . A-fib    End of Session Activity Tolerance: Patient  tolerated treatment well General Behavior During Session: [facility] for tasks performed Cognition: [facility] for tasks performed   Shirlean Mylar, OTR/L  04/11/2012, 12:04 PM

## 2012-04-11 NOTE — Progress Notes (Signed)
Speech Language Pathology Treatment Patient Details  Name: DARSHAN SOLANKI MRN: [id number] Date of Birth: 06-05-1945  Today's Date: 04/11/2012 Time: 0930-1015 SLP Time Calculation (min): 45 min  Authorization: Medicare  Authorization Time Period: 03/25/2012-04/22/2012  Authorization Visit#:  10 of     HPI:    Pain Assessment Currently in Pain?: No/denies  Treatment  Cognitive-Linguistic Therapy Oral Motor Exercises Patient/Family Education Home Exercise Program  SLP Goals   Good progress toward goals. He has met 3/5 current short term goals.  Assessment/Plan  Patient Active Problem List  Diagnosis  . DIABETES  . OSTEOARTHRITIS, LOWER LEG  . OSTEOARTHRITIS, KNEE, LEFT  . JOINT EFFUSION, KNEE  . Pain in Joint, Lower Leg  . SCIATICA, RIGHT  . TOTAL KNEE FOLLOW-UP  . Pain, knee  . S/P total knee replacement  . Diabetes mellitus  . Focal motor seizure  . Embolic stroke involving middle cerebral artery  . Aspiration into respiratory tract  . Respiratory distress  . CVA (cerebral infarction)  . Muscle weakness (generalized)  . Lack of coordination  . A-fib   SLP - End of Session Activity Tolerance: Patient tolerated treatment well General Behavior During Session: [facility] for tasks performed Cognition: [facility] for tasks performed  SLP Assessment/Plan Clinical Impression Statement: Mr. Bernards completed all of his homework, but his folder was in disarray with calendar, schedule, and homework out of order. He completed written attention tasks in session with 90% acc and min cues to go back and check his work. He was asked to complete the 10 problems in numerical order, but he continues to jump around the page. His primary goal is to improve his hand function and return to work. I plan to see him one more session and discharge him with his home program.  Speech Therapy Frequency: min 1 x/week Duration: 1 week (3 weeks) Treatment/Interventions: Compensatory techniques;SLP instruction and  feedback;Patient/family education;Compensatory strategies;Functional tasks;Cueing hierarchy;Oral motor exercises;Internal/external aids;Cognitive reorganization Potential to Achieve Goals: Good  GN  Memory: Goal CI, current: CJ  [name],[name] 04/11/2012, 3:19 PM

## 2012-04-15 ENCOUNTER — Ambulatory Visit (HOSPITAL_COMMUNITY)
Admission: RE | Admit: 2012-04-15 | Discharge: 2012-04-15 | Disposition: A | Discharge status: Home / Self Care | Payer: BC Managed Care – PPO | Source: Ambulatory Visit | Attending: Family Medicine | Admitting: Family Medicine

## 2012-04-15 ENCOUNTER — Ambulatory Visit (HOSPITAL_COMMUNITY): Payer: BC Managed Care – PPO | Admitting: Speech Pathology

## 2012-04-15 DIAGNOSIS — I6990 Unspecified sequelae of unspecified cerebrovascular disease: Secondary | ICD-10-CM | POA: Diagnosis not present

## 2012-04-15 DIAGNOSIS — R279 Unspecified lack of coordination: Secondary | ICD-10-CM | POA: Diagnosis not present

## 2012-04-15 DIAGNOSIS — G3184 Mild cognitive impairment, so stated: Secondary | ICD-10-CM | POA: Diagnosis not present

## 2012-04-15 DIAGNOSIS — I69928 Other speech and language deficits following unspecified cerebrovascular disease: Secondary | ICD-10-CM | POA: Diagnosis not present

## 2012-04-15 DIAGNOSIS — Z5189 Encounter for other specified aftercare: Secondary | ICD-10-CM | POA: Diagnosis not present

## 2012-04-15 DIAGNOSIS — M6281 Muscle weakness (generalized): Secondary | ICD-10-CM | POA: Diagnosis not present

## 2012-04-15 NOTE — Progress Notes (Signed)
Occupational Therapy Treatment Patient Details  Name: Brendan Scott MRN: [id number] Date of Birth: 1945-04-03  Today's Date: 04/15/2012 Time: 1015-1100 OT Time Calculation (min): 45 min Cog. Dev. 1308-6578 45'  Visit#: 18 of 36  Re-eval: 04/22/12    Authorization: medicare  Authorization Time Period: before 20th visit   Authorization Visit#: 18 of 20  Subjective Symptoms/Limitations Symptoms: S: I was using a 2# weight to work my hand and it swelled up. I guess the weight was too much. Pain Assessment Currently in Pain?: No/denies  Precautions/Restrictions   Fall, left neglect  Exercise/Treatments Cognitive Exercises Scanning: Pt completed Hidden Picture worksheet with focus on figure ground, scanning, STM, and direction following. Patient initially could not complete the worksheet without cues. Patient required min-mod cues to complete activity with therapist cueing on location of page to look to locate picture. Patient did require min cues to remain on task and to follow directions.        Occupational Therapy Assessment and Plan OT Assessment and Plan Clinical Impression Statement: A: Difficulty with attention to task and scanning task but was able to complete half of worksheet with assistance. Pt took the worksheet home and instructed wife on how to help cue him. OT Plan: P:  Increase independence with visual scanning during functional tasks.     Goals Short Term Goals Time to Complete Short Term Goals: 6 weeks Short Term Goal 1: Patient will be educated and perform HEP. Short Term Goal 2: Patient will increase AROM in his left shoulder to 75% for increased ablility to reach for objects when completing functional activiites.   Short Term Goal 3: Patient will increase left grip strength by 10 pounds and pich strength by 5 pounds for increased ability to open containers when fishing. Short Term Goal 3 Progress: Progressing toward goal Short Term Goal 4: Patient will  complete Nine Hole Peg Test in standardized fashion. Short Term Goal 5: Patient will use his left hand as a gross assist with all daily activities. Long Term Goals Time to Complete Long Term Goals: 12 weeks Long Term Goal 1: Patient will return to highest level of independence with leisure and home activities. Long Term Goal 1 Progress: Progressing toward goal Long Term Goal 2: Patient will increase AROM in his left shoulder to 90% for increased ablility to reach for objects when completing functional activiites.   Long Term Goal 3: Patient will increase left grip strength by 40 pounds and pich strength by 10  pounds for increased ability to open containers when fishing. Long Term Goal 3 Progress: Progressing toward goal Long Term Goal 4: Patient will improve fine motor coordination in order to complete Nine Hole Peg Test in 45' or less. Long Term Goal 4 Progress: Progressing toward goal Long Term Goal 5: Patient will use left hand to zip coat/jacket/pants without physical assistance. Long Term Goal 5 Progress: Progressing toward goal  Problem List Patient Active Problem List  Diagnosis  . DIABETES  . OSTEOARTHRITIS, LOWER LEG  . OSTEOARTHRITIS, KNEE, LEFT  . JOINT EFFUSION, KNEE  . Pain in Joint, Lower Leg  . SCIATICA, RIGHT  . TOTAL KNEE FOLLOW-UP  . Pain, knee  . S/P total knee replacement  . Diabetes mellitus  . Focal motor seizure  . Embolic stroke involving middle cerebral artery  . Aspiration into respiratory tract  . Respiratory distress  . CVA (cerebral infarction)  . Muscle weakness (generalized)  . Lack of coordination  . A-fib    End of  Session Activity Tolerance: Patient tolerated treatment well General Behavior During Session: [facility] for tasks performed Cognition: [facility] for tasks performed   Limmie Patricia, OTR/L  04/15/2012, 12:58 PM

## 2012-04-15 NOTE — Progress Notes (Signed)
Speech Language Pathology Treatment/Discharge Summary Patient Details  Name: Brendan Scott MRN: [id number] Date of Birth: 01/16/1946  Today's Date: 04/15/2012 Time: 0930-1015 SLP Time Calculation (min): 45 min  Authorization: Medicare  Authorization Time Period: 03/25/2012-04/22/2012  Authorization Visit#:   of     HPI:  Symptoms/Limitations Symptoms: Doing well Pain Assessment Currently in Pain?: No/denies   Treatment  Cognitive-Linguistic Therapy Oral Motor Exercises Patient/Family Education Home Exercise Program  SLP Goals  Home Exercise SLP Goal: Patient will Perform Home Exercise Program: with supervision, verbal cues required/provided SLP Short Term Goals SLP Short Term Goal 1: Pt will use left attention strategies in functional tasks with mi/mod cues. SLP Short Term Goal 1 - Progress: Partly met SLP Short Term Goal 2: Pt will implement memory strategies in functional tasks with mi/mod cues. SLP Short Term Goal 2 - Progress: Met SLP Short Term Goal 3: Pt will increase recall of functional information to 80% acc with use of compensatory strategies and mi/mod assist. SLP Short Term Goal 3 - Progress: Met SLP Short Term Goal 4: Pt will complete OMEs x10 each (labiolingual) with min cues. SLP Short Term Goal 4 - Progress: Met SLP Short Term Goal 5: Pt will implement speech intelligibility strategies in conversation with min cues for 100% intelligibility. SLP Short Term Goal 5 - Progress: Met SLP Long Term Goals SLP Long Term Goal 1: Pt will increase attention to left with independent use of strategies. SLP Long Term Goal 1 - Progress: Partly met SLP Long Term Goal 2: Pt will increase memory skills to [facility] in independent environment with use of strategies prn. SLP Long Term Goal 2 - Progress: Met  Assessment/Plan  Patient Active Problem List  Diagnosis  . DIABETES  . OSTEOARTHRITIS, LOWER LEG  . OSTEOARTHRITIS, KNEE, LEFT  . JOINT EFFUSION, KNEE  . Pain in Joint, Lower  Leg  . SCIATICA, RIGHT  . TOTAL KNEE FOLLOW-UP  . Pain, knee  . S/P total knee replacement  . Diabetes mellitus  . Focal motor seizure  . Embolic stroke involving middle cerebral artery  . Aspiration into respiratory tract  . Respiratory distress  . CVA (cerebral infarction)  . Muscle weakness (generalized)  . Lack of coordination  . A-fib   SLP - End of Session Activity Tolerance: Patient tolerated treatment well General Behavior During Session: [facility] for tasks performed Cognition: [facility] for tasks performed  SLP Assessment/Plan Clinical Impression Statement: [name] met all goals related to memory and speech intelligibility. He presents with only mild left asymmetry and is independent with oral motor exercises. He continues to have impairments with visual attention/scanning, however it has improved and he is knowledgeable about strategies (draw a line down left side of page, finger to point, scan, and trace). He and his wife say that he does not need to do any reading or writing at home except for signs in the community. She handles the bills/finances. He has a strong desire to return to work and to drive again. His left attention deficits preclude him from driving at this time. [name] was given a home exercise program to continue practicing visual scanning/attention at home.  He will continue to receive occupational therapy and they will continue to address left attention as well. No further SLP services indicated at this time. Treatment/Interventions: Compensatory techniques;SLP instruction and feedback;Patient/family education;Compensatory strategies;Functional tasks;Cueing hierarchy;Oral motor exercises;Internal/external aids;Cognitive reorganization Potential to Achieve Goals: Good  GN Functional Assessment Tool Used: clinical judgement Functional Limitations: Memory Memory Current Status (E9528):  At least 1 percent but less than 20 percent impaired, limited or restricted Memory  Goal Status ([id number]): At least 1 percent but less than 20 percent impaired, limited or restricted Memory Discharge Status [phone]): At least 1 percent but less than 20 percent impaired, limited or restricted  Thank you,  Havery Moros, CCC-SLP [phone]  [name],[name] 04/15/2012, 11:57 AM

## 2012-04-17 ENCOUNTER — Ambulatory Visit (HOSPITAL_COMMUNITY): Payer: BC Managed Care – PPO

## 2012-04-17 ENCOUNTER — Ambulatory Visit (HOSPITAL_COMMUNITY)
Admission: RE | Admit: 2012-04-17 | Discharge: 2012-04-17 | Disposition: A | Discharge status: Home / Self Care | Payer: BC Managed Care – PPO | Source: Ambulatory Visit | Attending: Family Medicine | Admitting: Family Medicine

## 2012-04-17 DIAGNOSIS — M6281 Muscle weakness (generalized): Secondary | ICD-10-CM | POA: Diagnosis not present

## 2012-04-17 DIAGNOSIS — I69928 Other speech and language deficits following unspecified cerebrovascular disease: Secondary | ICD-10-CM | POA: Diagnosis not present

## 2012-04-17 DIAGNOSIS — G3184 Mild cognitive impairment, so stated: Secondary | ICD-10-CM | POA: Diagnosis not present

## 2012-04-17 DIAGNOSIS — R279 Unspecified lack of coordination: Secondary | ICD-10-CM | POA: Diagnosis not present

## 2012-04-17 DIAGNOSIS — Z5189 Encounter for other specified aftercare: Secondary | ICD-10-CM | POA: Diagnosis not present

## 2012-04-17 DIAGNOSIS — I6990 Unspecified sequelae of unspecified cerebrovascular disease: Secondary | ICD-10-CM | POA: Diagnosis not present

## 2012-04-17 NOTE — Progress Notes (Signed)
Occupational Therapy Treatment Patient Details  Name: Brendan Scott MRN: [id number] Date of Birth: 08-Nov-1945  Today's Date: 04/17/2012 Time: 1478-2956 OT Time Calculation (min): 50 min Neuro reeducation [phone] 45' Visit#: 19 of 36  Re-eval: 04/22/12    Authorization: medicare  Authorization Time Period: before 29th visit  Authorization Visit#: 19 of 20  Subjective Symptoms/Limitations Symptoms: S:  I like working with you on my hand, I feel challenged. Pain Assessment Currently in Pain?: Yes Pain Score:   1 Pain Location: Hand Pain Orientation: Left Pain Radiating Towards: soreness in his fingers  Precautions/Restrictions     Exercise/Treatments Cognitive Exercises Scanning: I educated patient on how to play solitaire.  Patient required max vg throughout the game, as he had never played the game before.  If I told him what card our given card needed to go under, he was able to locate the card, with one exception.  When playing war, he was able to scan cards and correctly identify the winner without problem.    Neurological Re-education Exercises    Weight Bearing Exercises Standing with weight shifting on and off: stood at elevated table to flatten mod resist pink theraputty, using his right hand to help straighten his left digits.    Development of Reach  Development of Reach: Closed chain Closed Chain Exercises: rolled therautty into a log form which promoted smooth reaching movement  Grasp and Release Theraputty - Flatten: pink Theraputty - Roll: pink Theraputty - Grip: pink Theraputty - Locate Pegs: 10 beads placed in putty and patient was able to locate and remove beads using his left hand        Activities of Daily Living Activities of Daily Living: Patient participated in playing solitaire and war with playing cards in order to improve his fine motor coordination in his left hand.  He is not able to shuffle cards.  He has moderate difficulty flipping cards over  and stacking cards.    Occupational Therapy Assessment and Plan OT Assessment and Plan Clinical Impression Statement: A:  Patient was able to manipulate playing cards with moderate difficulty this date.  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   OT Plan: P:  Complete visual scanning task of locating items in gift shop, or our kitchen if the gift shop is not open.  Focus on activities that improve grip strength and functional use/coordination of his left hand. Administer UEFI.   Goals Short Term Goals Time to Complete Short Term Goals: 6 weeks Short Term Goal 1: Patient will be educated and perform HEP. Short Term Goal 2: Patient will increase AROM in his left shoulder to 75% for increased ablility to reach for objects when completing functional activiites.   Short Term Goal 3: Patient will increase left grip strength by 10 pounds and pich strength by 5 pounds for increased ability to open containers when fishing. Short Term Goal 3 Progress: Progressing toward goal Short Term Goal 4: Patient will complete Nine Hole Peg Test in standardized fashion. Short Term Goal 5: Patient will use his left hand as a gross assist with all daily activities. Long Term Goals Time to Complete Long Term Goals: 12 weeks Long Term Goal 1: Patient will return to highest level of independence with leisure and home activities. Long Term Goal 1 Progress: Progressing toward goal Long Term Goal 2: Patient will increase AROM in his left shoulder to 90% for  increased ablility to reach for objects when completing functional activiites.   Long Term Goal 3: Patient will increase left grip strength by 40 pounds and pich strength by 10  pounds for increased ability to open containers when fishing. Long Term Goal 3 Progress: Progressing toward goal Long Term Goal 4: Patient will improve fine motor coordination in order to complete Nine  Hole Peg Test in 45' or less. Long Term Goal 4 Progress: Progressing toward goal Long Term Goal 5: Patient will use left hand to zip coat/jacket/pants without physical assistance. Long Term Goal 5 Progress: Progressing toward goal  Problem List Patient Active Problem List  Diagnosis  . DIABETES  . OSTEOARTHRITIS, LOWER LEG  . OSTEOARTHRITIS, KNEE, LEFT  . JOINT EFFUSION, KNEE  . Pain in Joint, Lower Leg  . SCIATICA, RIGHT  . TOTAL KNEE FOLLOW-UP  . Pain, knee  . S/P total knee replacement  . Diabetes mellitus  . Focal motor seizure  . Embolic stroke involving middle cerebral artery  . Aspiration into respiratory tract  . Respiratory distress  . CVA (cerebral infarction)  . Muscle weakness (generalized)  . Lack of coordination  . A-fib    End of Session Activity Tolerance: Patient tolerated treatment well General Behavior During Session: [facility] for tasks performed Cognition: [facility] for tasks performed  GO Functional Assessment Tool Used: Clinical observation and patient self report, UEFI will be administered at next visit.  75% I use of left arm.  25% impairment Functional Limitation: Carrying, moving and handling objects Carrying, Moving and Handling Objects Current Status [phone]): At least 20 percent but less than 40 percent impaired, limited or restricted Carrying, Moving and Handling Objects Goal Status [phone]): At least 1 percent but less than 20 percent impaired, limited or restricted  Shirlean Mylar, OTR/L  04/17/2012, 11:56 AM

## 2012-04-18 ENCOUNTER — Ambulatory Visit (HOSPITAL_COMMUNITY): Payer: BC Managed Care – PPO | Admitting: Speech Pathology

## 2012-04-18 ENCOUNTER — Ambulatory Visit (HOSPITAL_COMMUNITY)
Admission: RE | Admit: 2012-04-18 | Discharge: 2012-04-18 | Disposition: A | Discharge status: Home / Self Care | Payer: BC Managed Care – PPO | Source: Ambulatory Visit | Attending: Family Medicine | Admitting: Family Medicine

## 2012-04-18 DIAGNOSIS — M6281 Muscle weakness (generalized): Secondary | ICD-10-CM | POA: Diagnosis not present

## 2012-04-18 DIAGNOSIS — R279 Unspecified lack of coordination: Secondary | ICD-10-CM | POA: Diagnosis not present

## 2012-04-18 DIAGNOSIS — I69928 Other speech and language deficits following unspecified cerebrovascular disease: Secondary | ICD-10-CM | POA: Diagnosis not present

## 2012-04-18 DIAGNOSIS — Z5189 Encounter for other specified aftercare: Secondary | ICD-10-CM | POA: Diagnosis not present

## 2012-04-18 DIAGNOSIS — I6990 Unspecified sequelae of unspecified cerebrovascular disease: Secondary | ICD-10-CM | POA: Diagnosis not present

## 2012-04-18 DIAGNOSIS — G3184 Mild cognitive impairment, so stated: Secondary | ICD-10-CM | POA: Diagnosis not present

## 2012-04-18 NOTE — Progress Notes (Signed)
Occupational Therapy Treatment Patient Details  Name: MAURIZIO GENO MRN: [id number] Date of Birth: 09-24-1945  Today's Date: 04/18/2012 Time: 4098-1191 OT Time Calculation (min): 44 min Neuro re-ed 17'  Visit#: 20 of 36  Re-eval: 04/22/12    Authorization: medicare  Authorization Time Period: before 29th visit  Authorization Visit#: 20 of 20  Subjective Symptoms/Limitations Symptoms: S:  When I do stuff like try to tie my shoes or little stuff it shakes real bad.  Precautions/Restrictions     Exercise/Treatments Hand Exercises MCPJ Extension: Self ROM (during fine motor to help decrease tremors) PIPJ Extension: Self ROM (during fine motor to help decrease tremors) Theraputty - Flatten: pink Theraputty - Roll: pink Theraputty - Grip: pink Theraputty - Locate Pegs: 12 beads Sponges: 7 to 8 at a time Fine Motor Coordination:  (grooved pegboard and stretching digits between each peg.)  Neurological Re-education Exercises Sponges: 7 to 8 at a time  Weight Bearing Exercises Standing with weight shifting on and off: stood at elevated table to flatten mod resist pink theraputty, using his right hand to help straighten his left digits.     Grasp and Release Grasp and Release:  (using black hand gripper to grasp and release blocks ) Theraputty - Flatten: pink Theraputty - Roll: pink Theraputty - Grip: pink Theraputty - Locate Pegs: 12 beads  Fine Motor Coordination Fine Motor Coordination:  (grooved pegboard and stretching digits between each peg.)        Occupational Therapy Assessment and Plan OT Assessment and Plan Clinical Impression Statement: A:  Implemented gripping with black gripper and blocks. Patient had moderate with sustained grip and needed max cues to use his 3rd and 4th digit when gripping for maximal strength.  Patient also completed grooved pegboard.  Pt. stated when he attempts fine motor items he has increase in tremors.  Patient was instructed to provide  SROM to wrist, hand and digits prior to task to decrease tremors. OT Plan: P:  Complete visual scanning task of locating items in department, gift shop or kitchen in department.   Goals Short Term Goals Time to Complete Short Term Goals: 6 weeks Short Term Goal 1: Patient will be educated and perform HEP. Short Term Goal 2: Patient will increase AROM in his left shoulder to 75% for increased ablility to reach for objects when completing functional activiites.   Short Term Goal 3: Patient will increase left grip strength by 10 pounds and pich strength by 5 pounds for increased ability to open containers when fishing. Short Term Goal 4: Patient will complete Nine Hole Peg Test in standardized fashion. Short Term Goal 5: Patient will use his left hand as a gross assist with all daily activities. Long Term Goals Time to Complete Long Term Goals: 12 weeks Long Term Goal 1: Patient will return to highest level of independence with leisure and home activities. Long Term Goal 2: Patient will increase AROM in his left shoulder to 90% for increased ablility to reach for objects when completing functional activiites.   Long Term Goal 3: Patient will increase left grip strength by 40 pounds and pich strength by 10  pounds for increased ability to open containers when fishing. Long Term Goal 4: Patient will improve fine motor coordination in order to complete Nine Hole Peg Test in 47' or less. Long Term Goal 5: Patient will use left hand to zip coat/jacket/pants without physical assistance.  Problem List Patient Active Problem List  Diagnosis  . DIABETES  . OSTEOARTHRITIS, LOWER  LEG  . OSTEOARTHRITIS, KNEE, LEFT  . JOINT EFFUSION, KNEE  . Pain in Joint, Lower Leg  . SCIATICA, RIGHT  . TOTAL KNEE FOLLOW-UP  . Pain, knee  . S/P total knee replacement  . Diabetes mellitus  . Focal motor seizure  . Embolic stroke involving middle cerebral artery  . Aspiration into respiratory tract  . Respiratory  distress  . CVA (cerebral infarction)  . Muscle weakness (generalized)  . Lack of coordination  . A-fib    End of Session Activity Tolerance: Patient tolerated treatment well General Behavior During Session: [facility] for tasks performed Cognition: [facility] for tasks performed  GO   Jaretssi Kraker L. Noralee Stain, COTA/L 04/18/2012, 12:37 PM

## 2012-04-30 ENCOUNTER — Ambulatory Visit (HOSPITAL_COMMUNITY)
Admission: RE | Admit: 2012-04-30 | Discharge: 2012-04-30 | Disposition: A | Discharge status: Home / Self Care | Payer: BC Managed Care – PPO | Source: Ambulatory Visit | Attending: Family Medicine | Admitting: Family Medicine

## 2012-04-30 DIAGNOSIS — Z5189 Encounter for other specified aftercare: Secondary | ICD-10-CM | POA: Insufficient documentation

## 2012-04-30 DIAGNOSIS — G3184 Mild cognitive impairment, so stated: Secondary | ICD-10-CM | POA: Insufficient documentation

## 2012-04-30 DIAGNOSIS — M6281 Muscle weakness (generalized): Secondary | ICD-10-CM | POA: Insufficient documentation

## 2012-04-30 DIAGNOSIS — R279 Unspecified lack of coordination: Secondary | ICD-10-CM | POA: Insufficient documentation

## 2012-04-30 DIAGNOSIS — I69928 Other speech and language deficits following unspecified cerebrovascular disease: Secondary | ICD-10-CM | POA: Insufficient documentation

## 2012-04-30 DIAGNOSIS — E119 Type 2 diabetes mellitus without complications: Secondary | ICD-10-CM | POA: Insufficient documentation

## 2012-04-30 DIAGNOSIS — I6990 Unspecified sequelae of unspecified cerebrovascular disease: Secondary | ICD-10-CM | POA: Insufficient documentation

## 2012-04-30 NOTE — Progress Notes (Signed)
Occupational Therapy Treatment Patient Details  Name: Brendan Scott MRN: [id number] Date of Birth: 04/22/1945  Today's Date: 04/30/2012 Time: 4098-1191 OT Time Calculation (min): 41 min There ex 20' Reassess 20'  Visit#: 21 of 36  Re-eval: 05/28/12    Authorization: medicare  Authorization Time Period: before 31st visit  Authorization Visit#: 21 of 31  Subjective Symptoms/Limitations Symptoms: S:  I am ready to go fishing.  I caught a bunch of crappie last week.  Precautions/Restrictions     Exercise/Treatments Hand Exercises MCPJ Extension: PROM;Self ROM PIPJ Extension: PROM;Self ROM DIPJ Extension: PROM;Self ROM Theraputty - Flatten: pink Theraputty - Roll: pink Theraputty - Grip: pink Other Hand Exercises: grooved pegboard  Neurological Re-education Grasp and Release Theraputty - Flatten: pink Theraputty - Roll: pink Theraputty - Grip: pink   Occupational Therapy Assessment and Plan OT Assessment and Plan Clinical Impression Statement: A:  See progress note.  Pt. as met all STG's and 2 LTG's : Grip strength 40# lateral pinch 8#, 3 point pinch 7# shoulder/elbow strength 5/5; wrist strength 4/5; 9 hole peg test L:1'30" R: 25.6" Rehab Potential: Good OT Plan: P:  Continue 3x a week for 4 weeks to increase fine more strength and control and grip and pinch strength to allow patient to return to previous activities.   Goals Short Term Goals Time to Complete Short Term Goals: 6 weeks Short Term Goal 1: Patient will be educated and perform HEP. Short Term Goal 1 Progress: Met Short Term Goal 2: Patient will increase AROM in his left shoulder to 75% for increased ablility to reach for objects when completing functional activiites.   Short Term Goal 2 Progress: Met Short Term Goal 3: Patient will increase left grip strength by 10 pounds and pich strength by 5 pounds for increased ability to open containers when fishing. Short Term Goal 3 Progress: Met Short Term Goal 4:  Patient will complete Nine Hole Peg Test in standardized fashion. Short Term Goal 4 Progress: Met Short Term Goal 5: Patient will use his left hand as a gross assist with all daily activities. Short Term Goal 5 Progress: Met Long Term Goals Time to Complete Long Term Goals: 12 weeks Long Term Goal 1: Patient will return to highest level of independence with leisure and home activities. Long Term Goal 1 Progress: Progressing toward goal Long Term Goal 2: Patient will increase AROM in his left shoulder to 90% for increased ablility to reach for objects when completing functional activiites.   Long Term Goal 2 Progress: Met Long Term Goal 3: Patient will increase left grip strength by 40 pounds and pich strength by 10  pounds for increased ability to open containers when fishing. Long Term Goal 3 Progress: Progressing toward goal Long Term Goal 4: Patient will improve fine motor coordination in order to complete Nine Hole Peg Test in 45' or less. Long Term Goal 4 Progress: Progressing toward goal Long Term Goal 5: Patient will use left hand to zip coat/jacket/pants without physical assistance. Long Term Goal 5 Progress: Met  Problem List Patient Active Problem List  Diagnosis  . DIABETES  . OSTEOARTHRITIS, LOWER LEG  . OSTEOARTHRITIS, KNEE, LEFT  . JOINT EFFUSION, KNEE  . Pain in Joint, Lower Leg  . SCIATICA, RIGHT  . TOTAL KNEE FOLLOW-UP  . Pain, knee  . S/P total knee replacement  . Diabetes mellitus  . Focal motor seizure  . Embolic stroke involving middle cerebral artery  . Aspiration into respiratory tract  . Respiratory distress  .  CVA (cerebral infarction)  . Muscle weakness (generalized)  . Lack of coordination  . A-fib    End of Session Activity Tolerance: Patient tolerated treatment well General Behavior During Session: [facility] for tasks performed Cognition: [facility] for tasks performed  GO Functional Assessment Tool Used: UEFI score 72/80.  90% I use of left arm, 10%  impaired Functional Limitation: Carrying, moving and handling objects Carrying, Moving and Handling Objects Current Status ([id number]): At least 1 percent but less than 20 percent impaired, limited or restricted Carrying, Moving and Handling Objects Goal Status [phone]): At least 1 percent but less than 20 percent impaired, limited or restricted   Ifrah Vest L. Demetrick Eichenberger, COTA/L 04/30/2012, 11:32 AM

## 2012-05-01 ENCOUNTER — Ambulatory Visit (HOSPITAL_COMMUNITY)
Admission: RE | Admit: 2012-05-01 | Discharge: 2012-05-01 | Disposition: A | Discharge status: Home / Self Care | Payer: BC Managed Care – PPO | Source: Ambulatory Visit | Attending: Family Medicine | Admitting: Family Medicine

## 2012-05-07 ENCOUNTER — Ambulatory Visit: Payer: BC Managed Care – PPO | Admitting: Physical Medicine & Rehabilitation

## 2012-05-08 ENCOUNTER — Ambulatory Visit (HOSPITAL_COMMUNITY)
Admission: RE | Admit: 2012-05-08 | Discharge: 2012-05-08 | Disposition: A | Discharge status: Home / Self Care | Payer: BC Managed Care – PPO | Source: Ambulatory Visit | Attending: Family Medicine | Admitting: Family Medicine

## 2012-05-08 NOTE — Progress Notes (Signed)
Occupational Therapy Treatment Patient Details  Name: Brendan Scott MRN: [id number] Date of Birth: [birth year]  Today's Date: 05/08/2012 Time: 1191-4782 OT Time Calculation (min): 41 min Therex [phone] 41'  Visit#: 22 of 36  Re-eval: 05/28/12    Authorization: medicare  Authorization Time Period: before 31st visit  Authorization Visit#: 22 of 31  Subjective Symptoms/Limitations Symptoms: S: I work my hand all the time during the day.  Pain Assessment Currently in Pain?: No/denies  Precautions/Restrictions  Precautions Precautions: None  Exercise/Treatments Hand Exercises MCPJ Flexion: PROM;10 reps MCPJ Extension: PROM;10 reps PIPJ Flexion: PROM;10 reps PIPJ Extension: PROM;10 reps DIPJ Flexion: PROM;10 reps DIPJ Extension: PROM;10 reps Theraputty: Flatten;Roll;Grip;Pinch Theraputty - Flatten: green Theraputty - Roll: green Theraputty - Grip: green Theraputty - Pinch: green Theraputty - Locate Pegs: 10 beads Other Hand Exercises: grooved pegboard; used Lt hand only 90% of the time; 10 pegs placed with increased time Other Hand Exercises: rubberband handgripper 15X        Occupational Therapy Assessment and Plan OT Assessment and Plan Clinical Impression Statement: A: Tightness still noted in Lt hand thumb, pointer, and middle finger. Upgraded to green theraputty. Rehab Potential: Good OT Plan: P: Pt interested in receiving a HEP with theraband.   Goals Short Term Goals Time to Complete Short Term Goals: 6 weeks Short Term Goal 1: Patient will be educated and perform HEP. Short Term Goal 2: Patient will increase AROM in his left shoulder to 75% for increased ablility to reach for objects when completing functional activiites.   Short Term Goal 3: Patient will increase left grip strength by 10 pounds and pich strength by 5 pounds for increased ability to open containers when fishing. Short Term Goal 4: Patient will complete Nine Hole Peg Test in standardized  fashion. Short Term Goal 5: Patient will use his left hand as a gross assist with all daily activities. Long Term Goals Time to Complete Long Term Goals: 12 weeks Long Term Goal 1: Patient will return to highest level of independence with leisure and home activities. Long Term Goal 1 Progress: Progressing toward goal Long Term Goal 2: Patient will increase AROM in his left shoulder to 90% for increased ablility to reach for objects when completing functional activiites.   Long Term Goal 3: Patient will increase left grip strength by 40 pounds and pich strength by 10  pounds for increased ability to open containers when fishing. Long Term Goal 3 Progress: Progressing toward goal Long Term Goal 4: Patient will improve fine motor coordination in order to complete Nine Hole Peg Test in 45' or less. Long Term Goal 4 Progress: Progressing toward goal Long Term Goal 5: Patient will use left hand to zip coat/jacket/pants without physical assistance.  Problem List Patient Active Problem List  Diagnosis  . DIABETES  . OSTEOARTHRITIS, LOWER LEG  . OSTEOARTHRITIS, KNEE, LEFT  . JOINT EFFUSION, KNEE  . Pain in Joint, Lower Leg  . SCIATICA, RIGHT  . TOTAL KNEE FOLLOW-UP  . Pain, knee  . S/P total knee replacement  . Diabetes mellitus  . Focal motor seizure  . Embolic stroke involving middle cerebral artery  . Aspiration into respiratory tract  . Respiratory distress  . CVA (cerebral infarction)  . Muscle weakness (generalized)  . Lack of coordination  . A-fib    End of Session Activity Tolerance: Patient tolerated treatment well General Behavior During Session: [facility] for tasks performed Cognition: [facility] for tasks performed   Limmie Patricia, OTR/L,CBIS   05/08/2012, 10:32 AM

## 2012-05-10 ENCOUNTER — Ambulatory Visit (HOSPITAL_COMMUNITY)
Admission: RE | Admit: 2012-05-10 | Discharge: 2012-05-10 | Disposition: A | Discharge status: Home / Self Care | Payer: BC Managed Care – PPO | Source: Ambulatory Visit | Attending: Family Medicine | Admitting: Family Medicine

## 2012-05-10 NOTE — Progress Notes (Signed)
Occupational Therapy Treatment Patient Details  Name: JAVANTE NILSSON MRN: [id number] Date of Birth: 09/21/1945  Today's Date: 05/10/2012 Time: 4098-1191 OT Time Calculation (min): 39 min Neuro re-ed.40'  Visit#: 23 of 36  Re-eval: 05/28/12    Authorization: medicare  Authorization Time Period: before 31st visit  Authorization Visit#: 23 of 31  Subjective Symptoms/Limitations Symptoms: S:  I still wear that splint and I am trying to use this hand more.  Precautions/Restrictions     Exercise/Treatments Neurological Re-education  Weight Bearing Exercises Weight Bearing Position: Standing (when flattening putty)  Development of Reach  Closed Chain Exercises: UBE 3' forward 3' backwards.  Grasp and Release Theraputty - Flatten: green Theraputty - Roll: green Theraputty - Grip: green Theraputty - Pinch: green  Fine Motor Coordination Fine Motor Coordination:  (playing cards)        Occupational Therapy Assessment and Plan OT Assessment and Plan Clinical Impression Statement: A: Added UBE to increase shoulder AROM and strength.  Added tband to hep and education on stretching his hand to increase ROM. Rehab Potential: Good OT Plan: P:  Follow up on HEP.  review if necessary.   Goals Short Term Goals Time to Complete Short Term Goals: 6 weeks Short Term Goal 1: Patient will be educated and perform HEP. Short Term Goal 2: Patient will increase AROM in his left shoulder to 75% for increased ablility to reach for objects when completing functional activiites.   Short Term Goal 3: Patient will increase left grip strength by 10 pounds and pich strength by 5 pounds for increased ability to open containers when fishing. Short Term Goal 4: Patient will complete Nine Hole Peg Test in standardized fashion. Short Term Goal 5: Patient will use his left hand as a gross assist with all daily activities. Long Term Goals Time to Complete Long Term Goals: 12 weeks Long Term Goal 1:  Patient will return to highest level of independence with leisure and home activities. Long Term Goal 2: Patient will increase AROM in his left shoulder to 90% for increased ablility to reach for objects when completing functional activiites.   Long Term Goal 3: Patient will increase left grip strength by 40 pounds and pich strength by 10  pounds for increased ability to open containers when fishing. Long Term Goal 4: Patient will improve fine motor coordination in order to complete Nine Hole Peg Test in 83' or less. Long Term Goal 5: Patient will use left hand to zip coat/jacket/pants without physical assistance.  Problem List Patient Active Problem List  Diagnosis  . DIABETES  . OSTEOARTHRITIS, LOWER LEG  . OSTEOARTHRITIS, KNEE, LEFT  . JOINT EFFUSION, KNEE  . Pain in Joint, Lower Leg  . SCIATICA, RIGHT  . TOTAL KNEE FOLLOW-UP  . Pain, knee  . S/P total knee replacement  . Diabetes mellitus  . Focal motor seizure  . Embolic stroke involving middle cerebral artery  . Aspiration into respiratory tract  . Respiratory distress  . CVA (cerebral infarction)  . Muscle weakness (generalized)  . Lack of coordination  . A-fib    End of Session Activity Tolerance: Patient tolerated treatment well General Behavior During Session: [facility] for tasks performed Cognition: [facility] for tasks performed  GO   Joseff Luckman L. Hyatt Capobianco, COTA/L 05/10/2012, 2:05 PM

## 2012-05-14 ENCOUNTER — Ambulatory Visit (HOSPITAL_COMMUNITY): Payer: BC Managed Care – PPO | Admitting: Specialist

## 2012-05-15 ENCOUNTER — Ambulatory Visit (HOSPITAL_COMMUNITY)
Admission: RE | Admit: 2012-05-15 | Discharge: 2012-05-15 | Disposition: A | Discharge status: Home / Self Care | Payer: BC Managed Care – PPO | Source: Ambulatory Visit | Attending: Family Medicine | Admitting: Family Medicine

## 2012-05-15 NOTE — Progress Notes (Signed)
Occupational Therapy Treatment Patient Details  Name: Brendan Scott MRN: [id number] Date of Birth: 1945-05-01  Today's Date: 05/15/2012 Time: 7829-5621 OT Time Calculation (min): 52 min NM re-ed [phone] 52'  Visit#: 24 of 36  Re-eval: 05/28/12    Authorization: medicare  Authorization Time Period: before 31st visit  Authorization Visit#: 24 of 31  Subjective Symptoms/Limitations Symptoms: S: I wear the brace for 3 hours every day. I stretch my fingers in bed too. Pain Assessment Currently in Pain?: No/denies  Precautions/Restrictions  Precautions Precautions: None  Exercise/Treatments Hand Exercises MCPJ Flexion: PROM;10 reps MCPJ Extension: PROM;10 reps PIPJ Flexion: PROM;10 reps PIPJ Extension: PROM;10 reps DIPJ Flexion: PROM;10 reps DIPJ Extension: PROM;10 reps Theraputty: Flatten;Roll;Grip;Pinch Theraputty - Flatten: green Theraputty - Roll: green Theraputty - Grip: green Theraputty - Pinch: green Theraputty - Locate Pegs: 8 beads Thumb Opposition: PROM, AROM x10 Other Hand Exercises: 5x10; extended arm with flat palm stretch at theraband tower wall. Each set pt slide hand farther down the wall during stretch.         Occupational Therapy Assessment and Plan OT Assessment and Plan Clinical Impression Statement: A: Increased difficulty with Layfette pegboard due to joint tightness in left hand. Reviewed HEP and patient expresses no concerns and performs exercises every day. Rehab Potential: Good OT Plan: P: cont. to work on increasing fine motor coordination and decreasing joint tightness in left hand.   Goals Short Term Goals Time to Complete Short Term Goals: 6 weeks Short Term Goal 1: Patient will be educated and perform HEP. Short Term Goal 2: Patient will increase AROM in his left shoulder to 75% for increased ablility to reach for objects when completing functional activiites.   Short Term Goal 3: Patient will increase left grip strength by 10 pounds  and pich strength by 5 pounds for increased ability to open containers when fishing. Short Term Goal 4: Patient will complete Nine Hole Peg Test in standardized fashion. Short Term Goal 5: Patient will use his left hand as a gross assist with all daily activities. Long Term Goals Time to Complete Long Term Goals: 12 weeks Long Term Goal 1: Patient will return to highest level of independence with leisure and home activities. Long Term Goal 1 Progress: Progressing toward goal Long Term Goal 2: Patient will increase AROM in his left shoulder to 90% for increased ablility to reach for objects when completing functional activiites.   Long Term Goal 3: Patient will increase left grip strength by 40 pounds and pich strength by 10  pounds for increased ability to open containers when fishing. Long Term Goal 3 Progress: Progressing toward goal Long Term Goal 4: Patient will improve fine motor coordination in order to complete Nine Hole Peg Test in 45' or less. Long Term Goal 4 Progress: Progressing toward goal Long Term Goal 5: Patient will use left hand to zip coat/jacket/pants without physical assistance.  Problem List Patient Active Problem List  Diagnosis  . DIABETES  . OSTEOARTHRITIS, LOWER LEG  . OSTEOARTHRITIS, KNEE, LEFT  . JOINT EFFUSION, KNEE  . Pain in Joint, Lower Leg  . SCIATICA, RIGHT  . TOTAL KNEE FOLLOW-UP  . Pain, knee  . S/P total knee replacement  . Diabetes mellitus  . Focal motor seizure  . Embolic stroke involving middle cerebral artery  . Aspiration into respiratory tract  . Respiratory distress  . CVA (cerebral infarction)  . Muscle weakness (generalized)  . Lack of coordination  . A-fib    End of Session Activity  Tolerance: Patient tolerated treatment well General Cognition: [facility] for tasks performed   Limmie Patricia, OTR/L,CBIS   05/15/2012, 12:04 PM

## 2012-05-17 ENCOUNTER — Ambulatory Visit (HOSPITAL_COMMUNITY): Payer: BC Managed Care – PPO | Admitting: Occupational Therapy

## 2012-05-21 ENCOUNTER — Ambulatory Visit (HOSPITAL_COMMUNITY)
Admission: RE | Admit: 2012-05-21 | Discharge: 2012-05-21 | Disposition: A | Discharge status: Home / Self Care | Payer: BC Managed Care – PPO | Source: Ambulatory Visit | Attending: Family Medicine | Admitting: Family Medicine

## 2012-05-21 NOTE — Progress Notes (Signed)
Occupational Therapy Treatment Patient Details  Name: Brendan Scott MRN: [id number] Date of Birth: Jun 06, 1945  Today's Date: 05/21/2012 Time: 0865-7846 OT Time Calculation (min): 44 min Manual Therapy [phone] 15' Neuro reeducation [phone] 61' Visit#: 25 of 36  Re-eval: 05/28/12    Authorization: medicare  Authorization Time Period: before 31st visit  Authorization Visit#: 25 of 31  Subjective S:  I want to get my hand more flexible Pain Assessment Currently in Pain?: No/denies   Exercise/Treatments Neurological Re-education In Hand Manipulation Training: picked up pennies one at a time and placed in bank.  Then picked up penny translated to his palm, and held 3 pennies at a time.  Once pennies were in his hand, he translated the penny back to index finger and thumb and placed into bank with minimal difficulty.   Other Hand Exercises: Purdue Pegboard 8 sets of peg, cuff, and disc with mod difficulty. once assembled, he disassembled without difficulty.    Fine Motor Coordination In Hand Manipulation Training: picked up pennies one at a time and placed in bank.  Then picked up penny translated to his palm, and held 3 pennies at a time.  Once pennies were in his hand, he translated the penny back to index finger and thumb and placed into bank with minimal difficulty.       Manual Therapy Manual Therapy: Myofascial release Myofascial Release: MFR, manual stretching, and gentle joint mobs to left wrist, hand, and digits with passive stretching into flexion and extension. Activities of Daily Living Activities of Daily Living: Patient tied and untied his left shoe without difficulty this date.   Occupational Therapy Assessment and Plan OT Assessment and Plan Clinical Impression Statement: A:  Significant increase in independence with tying his shoes and manipulting small items on Purdue pegboard.   OT Plan: P:  Practice stringing fishing pole using left hand as an active assist.    Goals Short Term Goals Time to Complete Short Term Goals: 6 weeks Short Term Goal 1: Patient will be educated and perform HEP. Short Term Goal 2: Patient will increase AROM in his left shoulder to 75% for increased ablility to reach for objects when completing functional activiites.   Short Term Goal 3: Patient will increase left grip strength by 10 pounds and pich strength by 5 pounds for increased ability to open containers when fishing. Short Term Goal 4: Patient will complete Nine Hole Peg Test in standardized fashion. Short Term Goal 5: Patient will use his left hand as a gross assist with all daily activities. Long Term Goals Time to Complete Long Term Goals: 12 weeks Long Term Goal 1: Patient will return to highest level of independence with leisure and home activities. Long Term Goal 1 Progress: Progressing toward goal Long Term Goal 2: Patient will increase AROM in his left shoulder to 90% for increased ablility to reach for objects when completing functional activiites.   Long Term Goal 3: Patient will increase left grip strength by 40 pounds and pich strength by 10  pounds for increased ability to open containers when fishing. Long Term Goal 3 Progress: Progressing toward goal Long Term Goal 4: Patient will improve fine motor coordination in order to complete Nine Hole Peg Test in 45' or less. Long Term Goal 4 Progress: Progressing toward goal Long Term Goal 5: Patient will use left hand to zip coat/jacket/pants without physical assistance. Long Term Goal 5 Progress: Progressing toward goal  Problem List Patient Active Problem List  Diagnosis  . DIABETES  .  OSTEOARTHRITIS, LOWER LEG  . OSTEOARTHRITIS, KNEE, LEFT  . JOINT EFFUSION, KNEE  . Pain in Joint, Lower Leg  . SCIATICA, RIGHT  . TOTAL KNEE FOLLOW-UP  . Pain, knee  . S/P total knee replacement  . Diabetes mellitus  . Focal motor seizure  . Embolic stroke involving middle cerebral artery  . Aspiration into  respiratory tract  . Respiratory distress  . CVA (cerebral infarction)  . Muscle weakness (generalized)  . Lack of coordination  . A-fib    End of Session Activity Tolerance: Patient tolerated treatment well General Behavior During Therapy: [facility] for tasks assessed/performed Cognition: [facility] for tasks performed  GO    Shirlean Mylar, OTR/L  05/21/2012, 11:15 AM

## 2012-05-22 DIAGNOSIS — E119 Type 2 diabetes mellitus without complications: Secondary | ICD-10-CM | POA: Diagnosis not present

## 2012-05-23 ENCOUNTER — Ambulatory Visit (HOSPITAL_COMMUNITY)
Admission: RE | Admit: 2012-05-23 | Discharge: 2012-05-23 | Disposition: A | Discharge status: Home / Self Care | Payer: BC Managed Care – PPO | Source: Ambulatory Visit | Attending: Family Medicine | Admitting: Family Medicine

## 2012-05-23 NOTE — Progress Notes (Signed)
Occupational Therapy Treatment Patient Details  Name: Brendan Scott MRN: [id number] Date of Birth: Dec 26, 1945  Today's Date: 05/23/2012 Time: 4098-1191 OT Time Calculation (min): 37 min Manual Therapy [phone] 10' Self Care [id number] 27' Visit#: 26 of 36  Re-eval: 05/28/12    Authorization: medicare  Authorization Time Period: before 31st visit  Authorization Visit#: 26 of 31  Subjective Symptoms/Limitations Symptoms: S: I didnt think I could do that. Pain Assessment Currently in Pain?: No/denies  Precautions/Restrictions   N/A  Exercise/Treatments Cognitive Exercises Scanning: stood apprximately 10 feet from numbers on wall and was able to read off all numbers, using pattern beginning at top right going down across bottom and up the left side, which is an acceptable scanning pattern to utilize.   Neurological Re-education Fine Motor Coordination Fine Motor Coordination: Small Pegboard (grooved pegboard placed and removed without difficulty)     Manual Therapy Manual Therapy: Myofascial release Myofascial Release: MFR, manual stretching, and gentle joint mobs to left wrist, hand, and digits with passive stretching into flexion and extension. Activities of Daily Living Activities of Daily Living: [name] independently restrung his fishing pole today.  He had minimal difficulty getting the line through the top two guides, however, once he determined he needed to lower the height of pole, he was able to successfully place the line through the guides.  He independently tied the hook to the line, trimmed the excess line, tightened the line, used needle nose pliers to open the weights and place the weights on the line.  Mr. Caltagirone was pleased with his progress.  Occupational Therapy Assessment and Plan OT Assessment and Plan Clinical Impression Statement: A:  Patient was able to restring his fishing line today, including the hook and weights, with complete independence.  OT Plan: P:   Cooking task.    Goals Short Term Goals Time to Complete Short Term Goals: 6 weeks Short Term Goal 1: Patient will be educated and perform HEP. Short Term Goal 2: Patient will increase AROM in his left shoulder to 75% for increased ablility to reach for objects when completing functional activiites.   Short Term Goal 3: Patient will increase left grip strength by 10 pounds and pich strength by 5 pounds for increased ability to open containers when fishing. Short Term Goal 4: Patient will complete Nine Hole Peg Test in standardized fashion. Short Term Goal 5: Patient will use his left hand as a gross assist with all daily activities. Long Term Goals Time to Complete Long Term Goals: 12 weeks Long Term Goal 1: Patient will return to highest level of independence with leisure and home activities. Long Term Goal 2: Patient will increase AROM in his left shoulder to 90% for increased ablility to reach for objects when completing functional activiites.   Long Term Goal 3: Patient will increase left grip strength by 40 pounds and pich strength by 10  pounds for increased ability to open containers when fishing. Long Term Goal 4: Patient will improve fine motor coordination in order to complete Nine Hole Peg Test in 39' or less. Long Term Goal 5: Patient will use left hand to zip coat/jacket/pants without physical assistance.  Problem List Patient Active Problem List  Diagnosis  . DIABETES  . OSTEOARTHRITIS, LOWER LEG  . OSTEOARTHRITIS, KNEE, LEFT  . JOINT EFFUSION, KNEE  . Pain in Joint, Lower Leg  . SCIATICA, RIGHT  . TOTAL KNEE FOLLOW-UP  . Pain, knee  . S/P total knee replacement  . Diabetes mellitus  .  Focal motor seizure  . Embolic stroke involving middle cerebral artery  . Aspiration into respiratory tract  . Respiratory distress  . CVA (cerebral infarction)  . Muscle weakness (generalized)  . Lack of coordination  . A-fib    End of Session Activity Tolerance: Patient  tolerated treatment well General Behavior During Therapy: WFL for tasks assessed/performed Cognition: WFL for tasks performed   Shirlean Mylar, OTR/L  05/23/2012, 10:52 AM

## 2012-05-27 DIAGNOSIS — E1129 Type 2 diabetes mellitus with other diabetic kidney complication: Secondary | ICD-10-CM | POA: Diagnosis not present

## 2012-05-27 DIAGNOSIS — Z23 Encounter for immunization: Secondary | ICD-10-CM | POA: Diagnosis not present

## 2012-05-27 DIAGNOSIS — E785 Hyperlipidemia, unspecified: Secondary | ICD-10-CM | POA: Diagnosis not present

## 2012-05-27 DIAGNOSIS — I10 Essential (primary) hypertension: Secondary | ICD-10-CM | POA: Diagnosis not present

## 2012-05-27 DIAGNOSIS — Z6829 Body mass index (BMI) 29.0-29.9, adult: Secondary | ICD-10-CM | POA: Diagnosis not present

## 2012-05-28 ENCOUNTER — Ambulatory Visit (HOSPITAL_COMMUNITY)
Admission: RE | Admit: 2012-05-28 | Discharge: 2012-05-28 | Disposition: A | Discharge status: Home / Self Care | Payer: BC Managed Care – PPO | Source: Ambulatory Visit | Attending: Family Medicine | Admitting: Family Medicine

## 2012-05-28 NOTE — Progress Notes (Addendum)
Occupational Therapy Treatment Patient Details  Name: Brendan Scott MRN: [id number] Date of Birth: 01/17/1946  Today's Date: 05/28/2012 Time: 9528-4132 OT Time Calculation (min): 32 min Reassessment 24' SelfCare/HEP instruction 8' Visit#: 27 of 36  Re-eval: 05/28/12    Authorization: medicare  Authorization Time Period: before 31st visit  Authorization Visit#: 27 of 31  Subjective S:  I feel like I can do about anything I want to do around the house.  Im even driving a bit.   Special Tests: UEFI was 90% I and is currently 86% I level Pain Assessment Currently in Pain?: No/denies   Occupational Therapy Assessment and Plan OT Assessment and Plan Clinical Impression Statement: A: Revaluation for discharge completed this date. Current (04/30/12) LUE AROM is WFL [facility]), hand AROM 90% and able to oppose small finger (85% and able to oppose ring finger), strength is 5/5 in LUE (5/5 in shoulder and elbow and 4/5 in wrist), grip strength 44 pounds (40 pounds), lateral pinch strength 8 pounds (8 pounds), 3 point pinch strength 8 pounds (7 pounds).  Fine motor coordination assessed with nine hole peg test:  57" (1 minute 30").  Patient has met all short term goals and partly met all long term goals.   OT Plan: P:  DC from skilled OT intervention this date.     Goals Home Exercise Program Pt will Perform Home Exercise Program: Independently Short Term Goals Time to Complete Short Term Goals: 6 weeks Short Term Goal 1: Patient will be educated and perform HEP. Short Term Goal 1 Progress: Met Short Term Goal 2: Patient will increase AROM in his left shoulder to 75% for increased ablility to reach for objects when completing functional activiites.   Short Term Goal 2 Progress: Met Short Term Goal 3: Patient will increase left grip strength by 10 pounds and pich strength by 5 pounds for increased ability to open containers when fishing. Short Term Goal 3 Progress: Met Short Term Goal 4: Patient  will complete Nine Hole Peg Test in standardized fashion. Short Term Goal 4 Progress: Met Short Term Goal 5: Patient will use his left hand as a gross assist with all daily activities. Short Term Goal 5 Progress: Met Long Term Goals Time to Complete Long Term Goals: 12 weeks Long Term Goal 1: Patient will return to highest level of independence with leisure and home activities. Long Term Goal 1 Progress: Met Long Term Goal 2: Patient will increase AROM in his left shoulder to 90% for increased ablility to reach for objects when completing functional activiites.   Long Term Goal 2 Progress: Met Long Term Goal 3: Patient will increase left grip strength by 40 pounds and pich strength by 10  pounds for increased ability to open containers when fishing. Long Term Goal 3 Progress: Partly met Long Term Goal 4: Patient will improve fine motor coordination in order to complete Nine Hole Peg Test in 51' or less. Long Term Goal 4 Progress: Partly met Long Term Goal 5: Patient will use left hand to zip coat/jacket/pants without physical assistance. Long Term Goal 5 Progress: Met  Problem List Patient Active Problem List   Diagnosis Date Noted  . A-fib 02/29/2012  . Muscle weakness (generalized) 02/21/2012  . Lack of coordination 02/21/2012  . CVA (cerebral infarction) 12/28/2011  . Respiratory distress 12/25/2011  . Aspiration into respiratory tract 12/24/2011  . Embolic stroke involving middle cerebral artery 12/21/2011  . Focal motor seizure 12/16/2011  . Pain, knee 10/18/2010  . S/P  total knee replacement 10/18/2010  . Diabetes mellitus   . OSTEOARTHRITIS, KNEE, LEFT 07/12/2009  . TOTAL KNEE FOLLOW-UP 08/13/2007  . JOINT EFFUSION, KNEE 06/17/2007  . OSTEOARTHRITIS, LOWER LEG 12/05/2006  . Pain in Joint, Lower Leg 12/05/2006  . SCIATICA, RIGHT 12/05/2006  . DIABETES 10/31/2006    OT Plan of Care OT Home Exercise Plan: Reviewed current HEP and added FMC activities, grip and pinch  strengthening with green theraputty to HEP.  We also discussed his ability to do normal household activities and yardwork, and I encouraged him to do so, as long as he was being safe.  Consulted and Agree with Plan of Care: Patient  GO Functional Assessment Tool Used: UEFI 86% I use of left arm and 14% impaired  Dc [id number] Goal [id number] Shirlean Mylar, OTR/L  05/28/2012, 2:37 PM

## 2012-05-30 ENCOUNTER — Ambulatory Visit (HOSPITAL_COMMUNITY): Payer: BC Managed Care – PPO | Admitting: Specialist

## 2012-06-12 DIAGNOSIS — E1149 Type 2 diabetes mellitus with other diabetic neurological complication: Secondary | ICD-10-CM | POA: Diagnosis not present

## 2012-06-12 DIAGNOSIS — E119 Type 2 diabetes mellitus without complications: Secondary | ICD-10-CM | POA: Diagnosis not present

## 2012-09-03 DIAGNOSIS — Z8673 Personal history of transient ischemic attack (TIA), and cerebral infarction without residual deficits: Secondary | ICD-10-CM | POA: Diagnosis not present

## 2012-09-03 DIAGNOSIS — Z7901 Long term (current) use of anticoagulants: Secondary | ICD-10-CM | POA: Diagnosis not present

## 2012-09-03 DIAGNOSIS — I10 Essential (primary) hypertension: Secondary | ICD-10-CM | POA: Diagnosis not present

## 2012-09-03 DIAGNOSIS — S61209A Unspecified open wound of unspecified finger without damage to nail, initial encounter: Secondary | ICD-10-CM | POA: Diagnosis not present

## 2012-09-03 DIAGNOSIS — Z79899 Other long term (current) drug therapy: Secondary | ICD-10-CM | POA: Diagnosis not present

## 2012-09-03 DIAGNOSIS — E7800 Pure hypercholesterolemia, unspecified: Secondary | ICD-10-CM | POA: Diagnosis not present

## 2012-09-03 DIAGNOSIS — Z87891 Personal history of nicotine dependence: Secondary | ICD-10-CM | POA: Diagnosis not present

## 2012-09-03 DIAGNOSIS — E119 Type 2 diabetes mellitus without complications: Secondary | ICD-10-CM | POA: Diagnosis not present

## 2012-09-03 DIAGNOSIS — Z23 Encounter for immunization: Secondary | ICD-10-CM | POA: Diagnosis not present

## 2012-09-18 DIAGNOSIS — E119 Type 2 diabetes mellitus without complications: Secondary | ICD-10-CM | POA: Diagnosis not present

## 2012-09-18 DIAGNOSIS — E1149 Type 2 diabetes mellitus with other diabetic neurological complication: Secondary | ICD-10-CM | POA: Diagnosis not present

## 2012-09-26 ENCOUNTER — Ambulatory Visit (INDEPENDENT_AMBULATORY_CARE_PROVIDER_SITE_OTHER): Payer: BC Managed Care – PPO

## 2012-09-26 ENCOUNTER — Encounter: Payer: Self-pay | Admitting: Orthopedic Surgery

## 2012-09-26 ENCOUNTER — Ambulatory Visit (INDEPENDENT_AMBULATORY_CARE_PROVIDER_SITE_OTHER): Payer: BC Managed Care – PPO | Admitting: Orthopedic Surgery

## 2012-09-26 VITALS — BP 175/95 | Ht 66.0 in | Wt 189.0 lb

## 2012-09-26 DIAGNOSIS — M25562 Pain in left knee: Secondary | ICD-10-CM

## 2012-09-26 DIAGNOSIS — M25569 Pain in unspecified knee: Secondary | ICD-10-CM

## 2012-09-26 MED ORDER — HYDROCODONE-ACETAMINOPHEN 5-325 MG PO TABS: 1.0000 | ORAL_TABLET | Freq: Four times a day (QID) | ORAL | Status: DC | PRN

## 2012-09-26 NOTE — Patient Instructions (Signed)
Next appt in 6 months

## 2012-09-26 NOTE — Progress Notes (Signed)
Patient ID: Brendan Scott, male   DOB: [birth year], 67 y.o.   MRN: [id number]  Chief Complaint  Patient presents with  . Knee Pain    Left knee pain    HISTORY: 3 years postop left total knee presents after having a stroke in November of 2013 with a stiff painful knee. He did function well up until that time  No catching locking or giving way he has lost flexion  His knees around the peripatellar area of his joint  Review of systems he also has stiffness in his left hand from a stroke otherwise has been relatively healthy  BP 175/95  Ht 5\' 6"  (1.676 m)  Wt 189 lb (85.73 kg)  BMI 30.52 kg/m2 General appearance is normal, the patient is alert and oriented x3 with normal mood and affect. He walks with a normal gait pattern with a slight decreased stride length and knee flexion during stance phase  He does have swelling around the knee the incision looks clean there is tenderness around the joint but there is no joint effusion his knee flexion is limited to 45 he has full extension he is stable he does have a straight leg raise with a slight extensor lag is pulses good sensation is normal  The x-ray shows flexed prosthesis extravasation of cement chronic no change from previous films no loosening of the components  Impression post surgical vascular accident with left-sided residual and stiffening of the joint  Recommend Norco for pain reassess in 6 months which will be well past a year after the stroke. Discuss possible revision.

## 2012-10-14 ENCOUNTER — Ambulatory Visit (INDEPENDENT_AMBULATORY_CARE_PROVIDER_SITE_OTHER): Payer: BC Managed Care – PPO | Admitting: Cardiovascular Disease

## 2012-10-14 ENCOUNTER — Encounter: Payer: Self-pay | Admitting: Cardiovascular Disease

## 2012-10-14 VITALS — BP 153/81 | HR 51 | Ht 66.0 in | Wt 192.0 lb

## 2012-10-14 DIAGNOSIS — E785 Hyperlipidemia, unspecified: Secondary | ICD-10-CM | POA: Diagnosis not present

## 2012-10-14 DIAGNOSIS — I4891 Unspecified atrial fibrillation: Secondary | ICD-10-CM

## 2012-10-14 DIAGNOSIS — I10 Essential (primary) hypertension: Secondary | ICD-10-CM | POA: Diagnosis not present

## 2012-10-14 MED ORDER — CARVEDILOL 3.125 MG PO TABS: 3.1250 mg | ORAL_TABLET | Freq: Two times a day (BID) | ORAL | Status: DC

## 2012-10-14 NOTE — Patient Instructions (Addendum)
Your physician recommends that you schedule a follow-up appointment in: one year  

## 2012-10-14 NOTE — Progress Notes (Signed)
Patient ID: Brendan Scott, male   DOB: [birth year], 67 y.o.   MRN: [id number]   SUBJECTIVE: Mr. Hippler has a h/o permanent atrial fibrillation and is on Xarelto, a stroke in 12/2011, HTN, diabetes, and hyperlipidemia.  The patient denies any symptoms of chest pain, palpitations, shortness of breath, lightheadedness, dizziness, leg swelling, orthopnea, PND, and syncope.  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    No Known Allergies  Current Outpatient Prescriptions  Medication Sig Dispense Refill  . carvedilol (COREG) 3.125 MG tablet Take 1 tablet (3.125 mg total) by mouth 2 (two) times daily.  60 tablet  6  . clonazePAM (KLONOPIN) 0.5 MG tablet Take 1 tablet (0.5 mg total) by mouth at bedtime.  30 tablet  1  . glimepiride (AMARYL) 2 MG tablet Take 2 mg by mouth daily before breakfast.       . HYDROcodone-acetaminophen (NORCO) 5-325 MG per tablet Take 1 tablet by mouth every 6 (six) hours as needed for pain.  60 tablet  5  . Lacosamide (VIMPAT) 150 MG TABS Take 300 mg by mouth 2 (two) times daily.      Marland Kitchen levETIRAcetam (KEPPRA) 500 MG tablet Take 500 mg by mouth daily.      Marland Kitchen lisinopril (PRINIVIL,ZESTRIL) 5 MG tablet Take 1 tablet (5 mg total) by mouth daily.  30 tablet  1  . simvastatin (ZOCOR) 20 MG tablet Take 1 tablet (20 mg total) by mouth daily at 6 PM.  30 tablet  1  . warfarin (COUMADIN) 5 MG tablet Take 1 tablet (5 mg total) by mouth daily at 6 PM.  30 tablet  1   No current facility-administered medications for this visit.    Past Medical History  Diagnosis Date  . Arthritis   . Diabetes mellitus   . Atrial fibrillation   . HTN (hypertension)   . Stroke   . Seizure disorder     Past Surgical History  Procedure Laterality Date  . Total knee arthroplasty      Bilateral    History   Social History  . Marital Status: Married    Spouse Name: N/A    Number of Children: N/A  . Years of Education: N/A   Occupational History  . Not on file.    Social History Main Topics  . Smoking status: Former Games developer  . Smokeless tobacco: Not on file     Comment: quit 30 years ago  . Alcohol Use: No  . Drug Use: No  . Sexual Activity: Not on file   Other Topics Concern  . Not on file   Social History Narrative  . No narrative on file     BP 153/81  Pulse 51   PHYSICAL EXAM General: NAD Neck: No JVD, no thyromegaly or thyroid nodule.  Lungs: Clear to auscultation bilaterally with normal respiratory effort. CV: Nondisplaced PMI.  Heart regular S1/S2, no S3/S4, no murmur.  No peripheral edema.  No carotid bruit.  Normal pedal pulses.  Abdomen: Soft, nontender, no hepatosplenomegaly, no distention.  Neurologic: Alert and oriented x 3.  Psych: Normal affect. Extremities: No clubbing or cyanosis.   ECG: reviewed and available in electronic records.      ASSESSMENT AND PLAN: 1. Atrial fibrillation: on Coreg for rate control and maintained on Xarelto, as he had problems regulating his INR on warfarin. 2. HTN: uncontrolled today, but his personal log shows consistently normal readings. I've asked him to check his BP 3 times per week  for the next 4 weeks, and to inform me of the results. If his BP is uncontrolled, then I would increase lisinopril to 10 mg daily. 3. Hyperlipidemia: on simvastatin 20 mg daily.   Prentice Docker, M.D., F.A.C.C.

## 2012-10-18 NOTE — Addendum Note (Signed)
Encounter addended by: Jacqualine Code, OTR on: 10/18/2012  8:25 AM<BR>     Documentation filed: Clinical Notes, Flowsheet VN

## 2012-11-20 ENCOUNTER — Encounter: Payer: Self-pay | Admitting: Cardiovascular Disease

## 2012-11-27 ENCOUNTER — Telehealth: Payer: Self-pay | Admitting: Orthopedic Surgery

## 2012-11-27 DIAGNOSIS — E1149 Type 2 diabetes mellitus with other diabetic neurological complication: Secondary | ICD-10-CM | POA: Diagnosis not present

## 2012-11-27 DIAGNOSIS — E119 Type 2 diabetes mellitus without complications: Secondary | ICD-10-CM | POA: Diagnosis not present

## 2012-11-27 NOTE — Telephone Encounter (Signed)
Patient called with question about left knee, said it is still hurting, "in the middle of the knee, sometimes on the sides of knee" - last visit here 09/26/12 indicates try medication, Norco, and return in February for Xrays, which he has scheduled. States he does not have any of the pain medication now (since new regulation).  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  Please advise, schedule appointment or other recommendation? His home ph# [phone]

## 2012-11-27 NOTE — Telephone Encounter (Signed)
Routing to Dr Harrison 

## 2012-11-28 ENCOUNTER — Other Ambulatory Visit: Payer: Self-pay | Admitting: *Deleted

## 2012-11-28 DIAGNOSIS — M25562 Pain in left knee: Secondary | ICD-10-CM

## 2012-11-28 MED ORDER — HYDROCODONE-ACETAMINOPHEN 5-325 MG PO TABS: 1.0000 | ORAL_TABLET | Freq: Four times a day (QID) | ORAL | Status: AC | PRN

## 2012-11-28 NOTE — Telephone Encounter (Signed)
Refill medication   F/u feb as discussed

## 2012-11-28 NOTE — Telephone Encounter (Signed)
I called patient to let him know regarding appointment and that he would be receiving call from nurse about medication. Left message on answer machine.

## 2012-11-29 NOTE — Telephone Encounter (Signed)
Left message on answering machine that prescription could be picked up Monday.

## 2012-12-05 ENCOUNTER — Other Ambulatory Visit: Payer: Self-pay

## 2013-01-31 ENCOUNTER — Other Ambulatory Visit: Payer: Self-pay | Admitting: Neurology

## 2013-01-31 DIAGNOSIS — R4182 Altered mental status, unspecified: Secondary | ICD-10-CM

## 2013-02-05 DIAGNOSIS — E119 Type 2 diabetes mellitus without complications: Secondary | ICD-10-CM | POA: Diagnosis not present

## 2013-02-05 DIAGNOSIS — E1149 Type 2 diabetes mellitus with other diabetic neurological complication: Secondary | ICD-10-CM | POA: Diagnosis not present

## 2013-02-06 ENCOUNTER — Ambulatory Visit (HOSPITAL_COMMUNITY)
Admission: RE | Admit: 2013-02-06 | Discharge: 2013-02-06 | Disposition: A | Discharge status: Home / Self Care | Payer: BC Managed Care – PPO | Source: Ambulatory Visit | Attending: Neurology | Admitting: Neurology

## 2013-02-06 DIAGNOSIS — E119 Type 2 diabetes mellitus without complications: Secondary | ICD-10-CM | POA: Diagnosis not present

## 2013-02-06 DIAGNOSIS — Z8673 Personal history of transient ischemic attack (TIA), and cerebral infarction without residual deficits: Secondary | ICD-10-CM | POA: Insufficient documentation

## 2013-02-06 DIAGNOSIS — R4182 Altered mental status, unspecified: Secondary | ICD-10-CM

## 2013-02-06 DIAGNOSIS — I10 Essential (primary) hypertension: Secondary | ICD-10-CM | POA: Diagnosis not present

## 2013-02-17 DIAGNOSIS — I10 Essential (primary) hypertension: Secondary | ICD-10-CM | POA: Diagnosis not present

## 2013-02-17 DIAGNOSIS — E1129 Type 2 diabetes mellitus with other diabetic kidney complication: Secondary | ICD-10-CM | POA: Diagnosis not present

## 2013-02-17 DIAGNOSIS — Z6830 Body mass index (BMI) 30.0-30.9, adult: Secondary | ICD-10-CM | POA: Diagnosis not present

## 2013-02-17 DIAGNOSIS — N400 Enlarged prostate without lower urinary tract symptoms: Secondary | ICD-10-CM | POA: Diagnosis not present

## 2013-03-27 ENCOUNTER — Ambulatory Visit: Payer: Medicare Other | Admitting: Orthopedic Surgery

## 2013-04-08 ENCOUNTER — Ambulatory Visit: Payer: Medicare Other | Admitting: Gastroenterology

## 2013-04-08 ENCOUNTER — Telehealth: Payer: Self-pay | Admitting: Gastroenterology

## 2013-04-08 ENCOUNTER — Encounter: Payer: Self-pay | Admitting: Gastroenterology

## 2013-04-08 NOTE — Telephone Encounter (Signed)
Mailed letter and PCP is aware °

## 2013-04-08 NOTE — Telephone Encounter (Signed)
Pt was a no show

## 2013-04-22 ENCOUNTER — Ambulatory Visit (INDEPENDENT_AMBULATORY_CARE_PROVIDER_SITE_OTHER): Payer: BC Managed Care – PPO

## 2013-04-22 ENCOUNTER — Ambulatory Visit (INDEPENDENT_AMBULATORY_CARE_PROVIDER_SITE_OTHER): Payer: BC Managed Care – PPO | Admitting: Orthopedic Surgery

## 2013-04-22 ENCOUNTER — Encounter: Payer: Self-pay | Admitting: Orthopedic Surgery

## 2013-04-22 VITALS — BP 153/87 | Ht 66.0 in | Wt 190.0 lb

## 2013-04-22 DIAGNOSIS — Z96652 Presence of left artificial knee joint: Secondary | ICD-10-CM

## 2013-04-22 DIAGNOSIS — Z96659 Presence of unspecified artificial knee joint: Secondary | ICD-10-CM

## 2013-04-22 DIAGNOSIS — M25569 Pain in unspecified knee: Secondary | ICD-10-CM

## 2013-04-22 DIAGNOSIS — M25562 Pain in left knee: Secondary | ICD-10-CM

## 2013-04-22 NOTE — Patient Instructions (Signed)
maxfreeze as needed knee or hand

## 2013-04-22 NOTE — Progress Notes (Signed)
Patient ID: Brendan Scott, male   DOB: [birth year], 68 y.o.   MRN: [id number]  F/U    History this is the  4  Yr annual followup status post LEFT knee replacement  The patient is not having mild anterior knee pain with extension. The patient's function with normal activities is excellent   Review of systems musculoskeletal the patient denies catching locking or giving way in the knee    General appearance is normal, the patient is alert and oriented x3 with normal mood and affect. Knee flexion is  95 The knee is stable in the anterior posterior and medial lateral plane There is no tenderness or swelling  Status post LEFT total knee  The x-rays show a stable prosthesis with posterior cortical breakthrough stabilized by cement. This is been followed closely over the years and has not progressed.  A followup x-ray will be performed at one year from now

## 2013-04-23 DIAGNOSIS — E1149 Type 2 diabetes mellitus with other diabetic neurological complication: Secondary | ICD-10-CM | POA: Diagnosis not present

## 2013-04-23 DIAGNOSIS — E119 Type 2 diabetes mellitus without complications: Secondary | ICD-10-CM | POA: Diagnosis not present

## 2013-04-24 ENCOUNTER — Telehealth: Payer: Self-pay | Admitting: Orthopedic Surgery

## 2013-04-24 NOTE — Telephone Encounter (Signed)
Please try the medication for at least 2 weeks before saying it didn't work

## 2013-04-24 NOTE — Telephone Encounter (Signed)
Patient informed of Dr. Harrison's reply. 

## 2013-04-24 NOTE — Telephone Encounter (Signed)
Patient called, left voice message, stating that the "max freeze or flex freeze",(per his viist 04/22/13), is not helping much.  Asking if Dr Aline Brochure can prescribe some pain medication.  His ph#'s are Home [phone] and Mobile [phone].

## 2013-04-24 NOTE — Telephone Encounter (Signed)
Routing to Dr Harrison 

## 2013-05-19 ENCOUNTER — Telehealth: Payer: Self-pay | Admitting: Orthopedic Surgery

## 2013-05-19 NOTE — Telephone Encounter (Signed)
Patient called, and states he has tried max freeze cream and not is not getting any relief with it. Please advise.

## 2013-05-20 ENCOUNTER — Other Ambulatory Visit: Payer: Self-pay | Admitting: *Deleted

## 2013-05-20 DIAGNOSIS — M25562 Pain in left knee: Secondary | ICD-10-CM

## 2013-05-20 MED ORDER — TRAMADOL HCL 50 MG PO TABS: 50.0000 mg | ORAL_TABLET | Freq: Four times a day (QID) | ORAL | Status: DC | PRN

## 2013-05-20 NOTE — Telephone Encounter (Signed)
No other suggestions   Try tramadol 50 mg q 6   i just saw him

## 2013-05-20 NOTE — Telephone Encounter (Signed)
Patient called back and I gave him your reply, however, he states he can not take tylenol due to being on xarelto. He said max freeze did not work either, and he is hurting any other suggestions? Appointment? Please advise.

## 2013-05-20 NOTE — Telephone Encounter (Signed)
Not sure what hes talking about, last time he was here he did say he was hurting that badly   Is this something new??  Try tylenol extra strength every 6 hours

## 2013-05-21 NOTE — Telephone Encounter (Signed)
Prescription was sent to [facility] in [place], patient aware.

## 2013-05-26 DIAGNOSIS — H524 Presbyopia: Secondary | ICD-10-CM | POA: Diagnosis not present

## 2013-05-26 DIAGNOSIS — E119 Type 2 diabetes mellitus without complications: Secondary | ICD-10-CM | POA: Diagnosis not present

## 2013-07-09 DIAGNOSIS — E119 Type 2 diabetes mellitus without complications: Secondary | ICD-10-CM | POA: Diagnosis not present

## 2013-07-09 DIAGNOSIS — E1149 Type 2 diabetes mellitus with other diabetic neurological complication: Secondary | ICD-10-CM | POA: Diagnosis not present

## 2013-08-25 DIAGNOSIS — I10 Essential (primary) hypertension: Secondary | ICD-10-CM | POA: Diagnosis not present

## 2013-08-25 DIAGNOSIS — Z23 Encounter for immunization: Secondary | ICD-10-CM | POA: Diagnosis not present

## 2013-08-25 DIAGNOSIS — E1129 Type 2 diabetes mellitus with other diabetic kidney complication: Secondary | ICD-10-CM | POA: Diagnosis not present

## 2013-08-25 DIAGNOSIS — Z6828 Body mass index (BMI) 28.0-28.9, adult: Secondary | ICD-10-CM | POA: Diagnosis not present

## 2013-09-16 DIAGNOSIS — E119 Type 2 diabetes mellitus without complications: Secondary | ICD-10-CM | POA: Diagnosis not present

## 2013-09-16 DIAGNOSIS — E1149 Type 2 diabetes mellitus with other diabetic neurological complication: Secondary | ICD-10-CM | POA: Diagnosis not present

## 2013-09-22 DIAGNOSIS — M25569 Pain in unspecified knee: Secondary | ICD-10-CM | POA: Diagnosis not present

## 2013-09-23 DIAGNOSIS — M6281 Muscle weakness (generalized): Secondary | ICD-10-CM | POA: Diagnosis not present

## 2013-09-23 DIAGNOSIS — R262 Difficulty in walking, not elsewhere classified: Secondary | ICD-10-CM | POA: Diagnosis not present

## 2013-09-23 DIAGNOSIS — M25569 Pain in unspecified knee: Secondary | ICD-10-CM | POA: Diagnosis not present

## 2013-10-02 DIAGNOSIS — R262 Difficulty in walking, not elsewhere classified: Secondary | ICD-10-CM | POA: Diagnosis not present

## 2013-10-02 DIAGNOSIS — M6281 Muscle weakness (generalized): Secondary | ICD-10-CM | POA: Diagnosis not present

## 2013-10-02 DIAGNOSIS — M25569 Pain in unspecified knee: Secondary | ICD-10-CM | POA: Diagnosis not present

## 2013-10-02 IMAGING — CT CT HEAD W/O CM
1 of 2 series · 13 of 30 positions shown, 17 images · non-contrast
Comparison: Most recent CT 12/16/2011.  Most recent MR 12/16/2011.

CLINICAL DATA: Follow-up stroke. No reported acute changes.
History of diabetes.

CT HEAD WITHOUT CONTRAST
TECHNIQUE: Contiguous axial images were obtained from the base of
the skull through the vertex without contrast.

[Series 2: brain · axial · 0.47mm/px · z∈[+183,+318]mm · 13 of 32 slices shown, 17 images]
[im 3/32  brain]
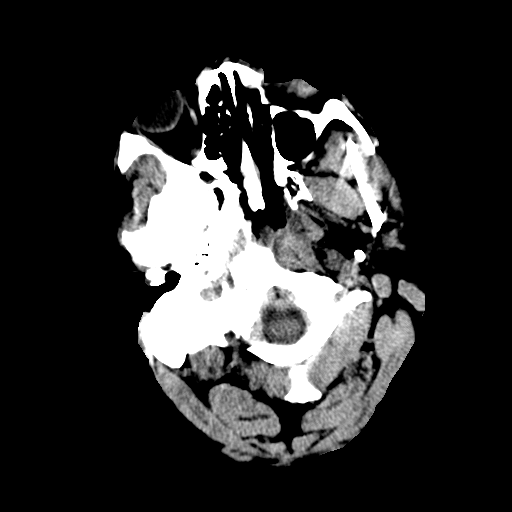
[im 3/32  bone]
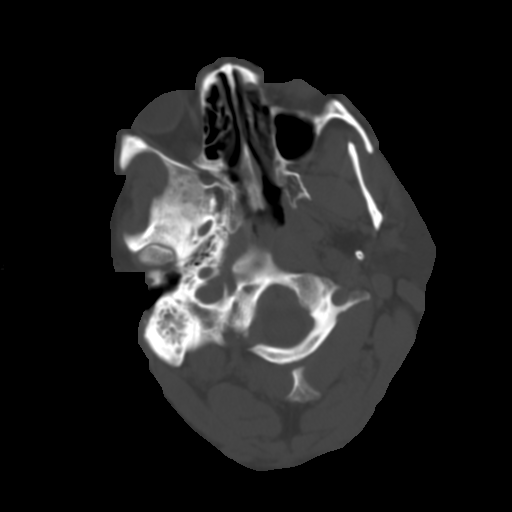
[im 5/32  brain]
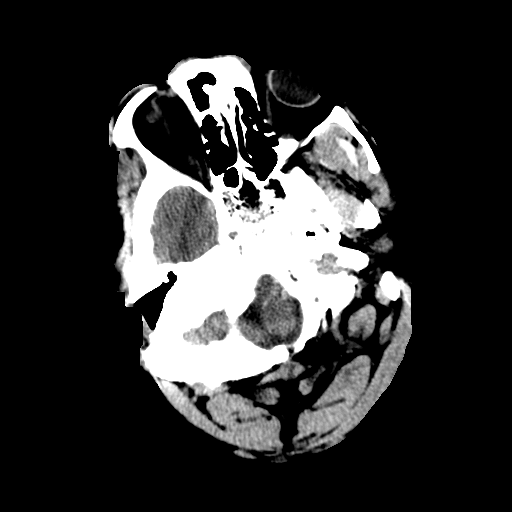
[im 7/32  brain]
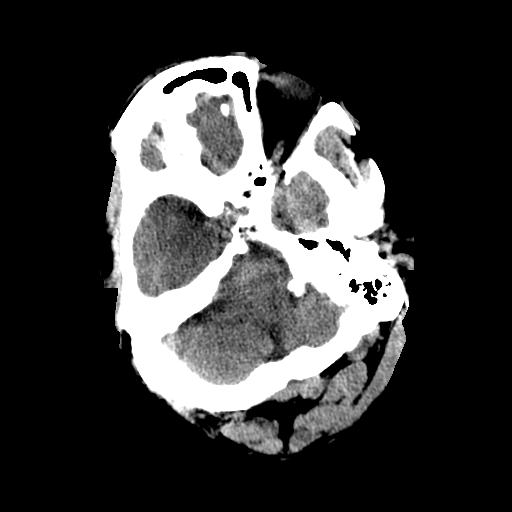
[im 9/32  brain]
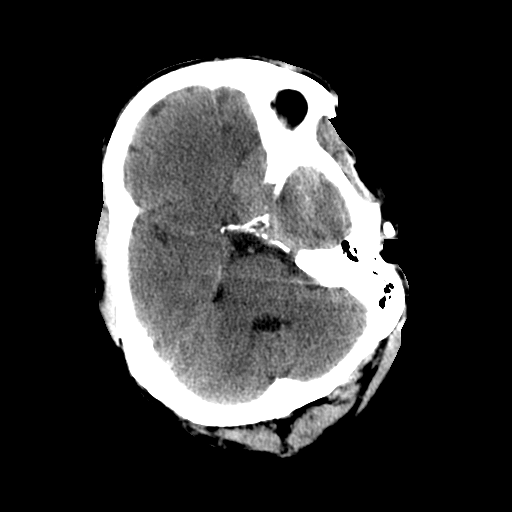
[im 12/32  brain]
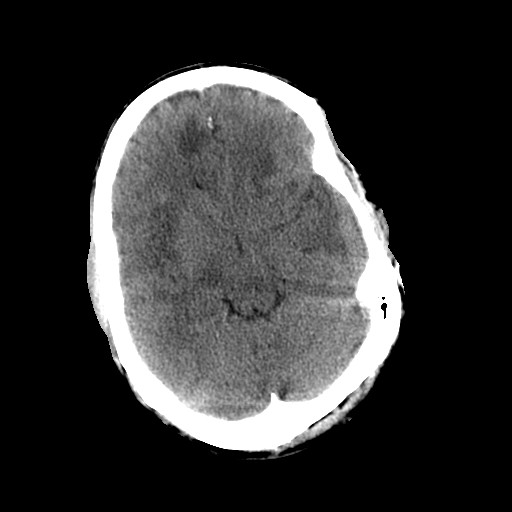
[im 12/32  bone]
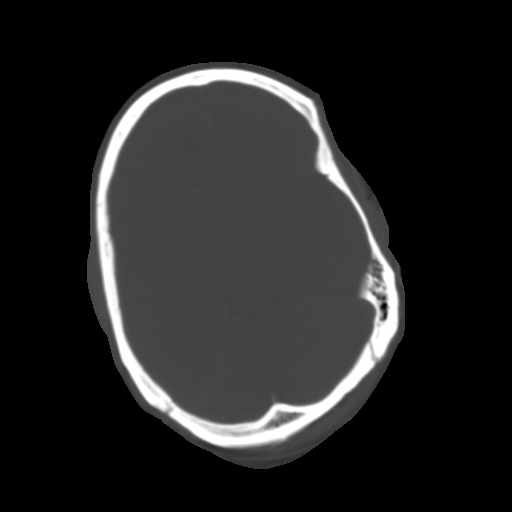
[im 14/32  brain]
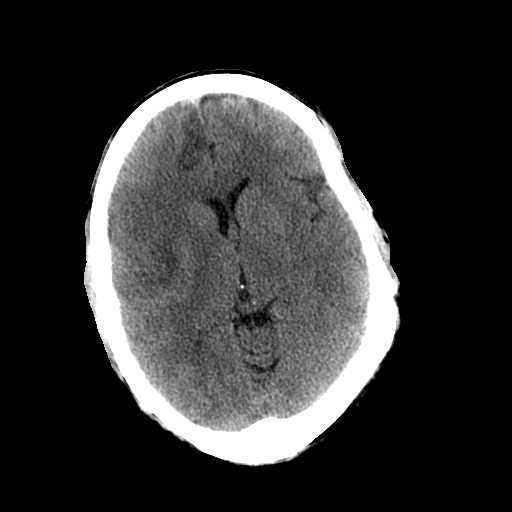
[im 16/32  brain]
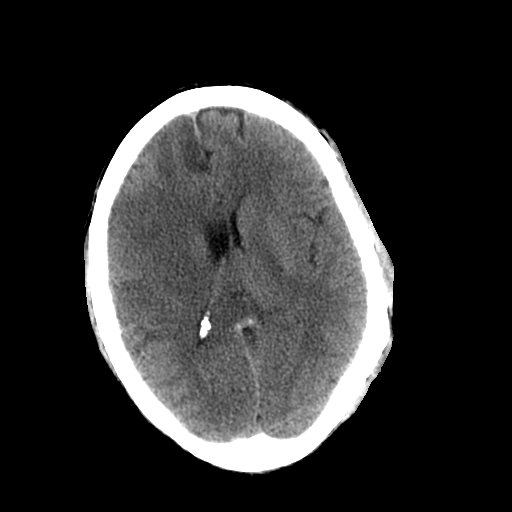
[im 18/32  brain]
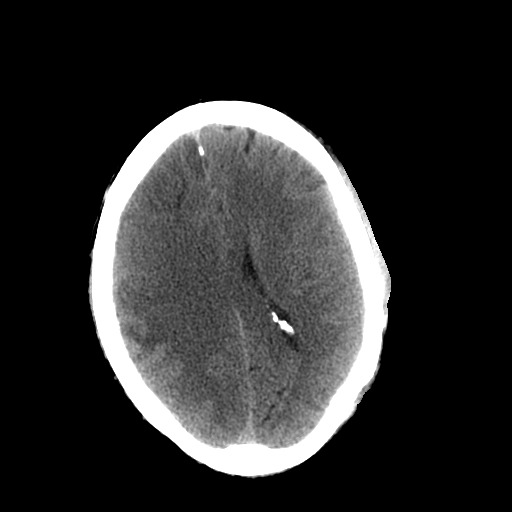
[im 20/32  brain]
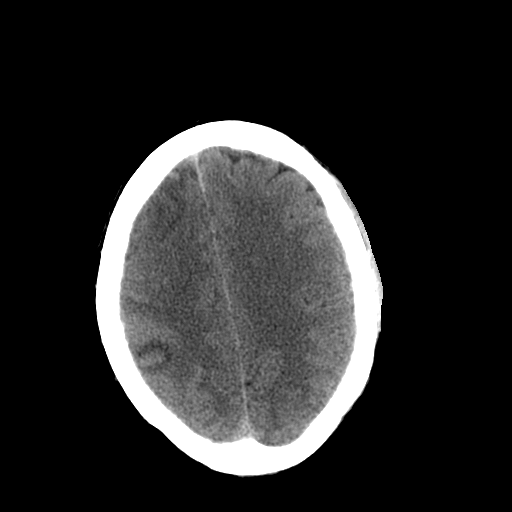
[im 20/32  bone]
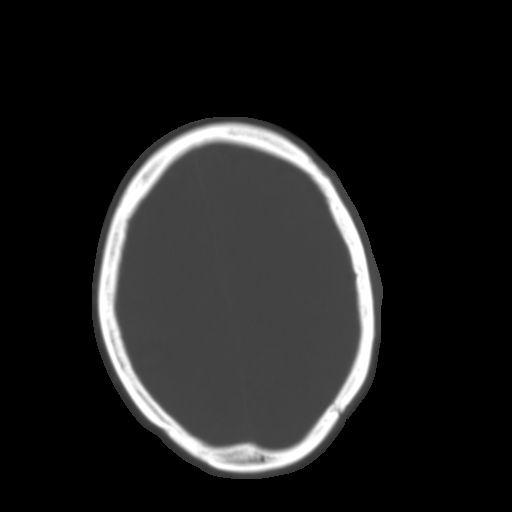
[im 23/32  brain]
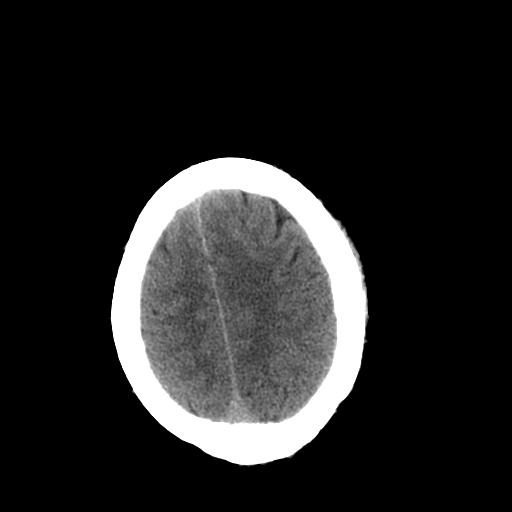
[im 25/32  brain]
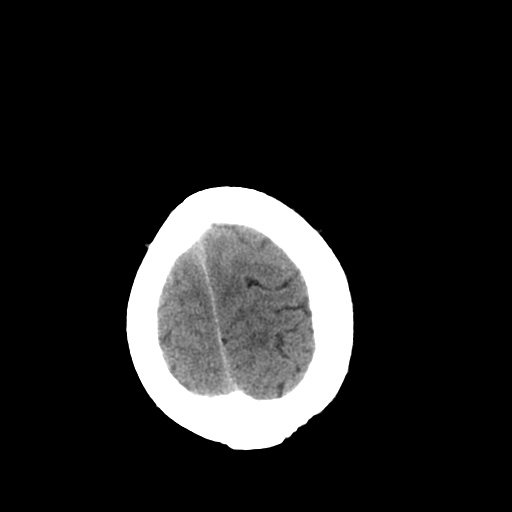
[im 27/32  brain]
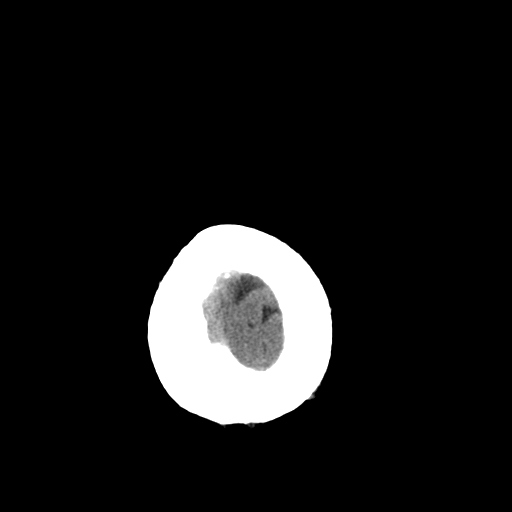
[im 29/32  brain]
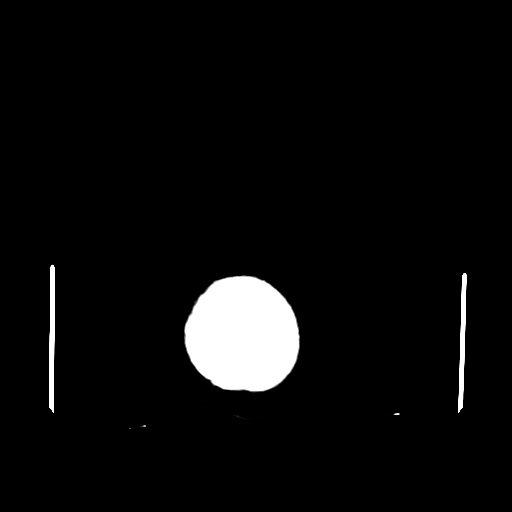
[im 29/32  bone]
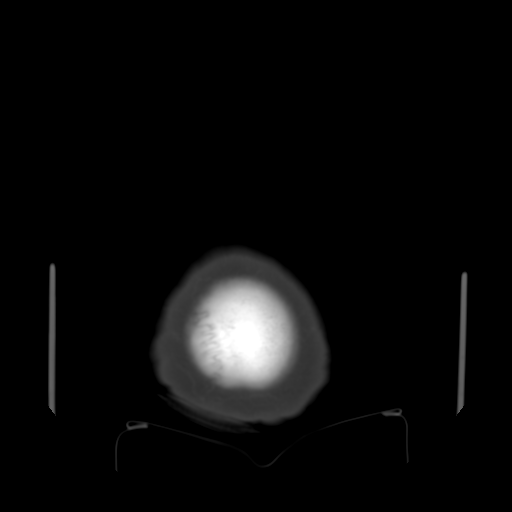

[13 of 30 positions shown; findings below may reference images not displayed]

FINDINGS: Evolving cytotoxic edema in the right frontal and
temporal cortex and subcortical white matter within the right ACA
and MCA territories, as demonstrated from prior MR diffusion
weighted imaging. Developing mild hemorrhagic transformation of the
significant mass effect.  3 mm of right-to-left shift.  Unchanged
large sphenoid wing meningioma on the left.
IMPRESSION: Evolving cytotoxic edema in the right frontotemporal region.  Early
hemorrhagic transformation right posterior frontal cortex.   No
significant mass effect.  3 mm right-to-left shift.

## 2013-10-04 IMAGING — CR DG ABD PORTABLE 1V
1 series · 1 of 1 positions shown · non-contrast
Comparison: Earlier today at 6626 hours.

CLINICAL DATA: Nasogastric tube placement.

PORTABLE ABDOMEN - 1 VIEW

[AP]
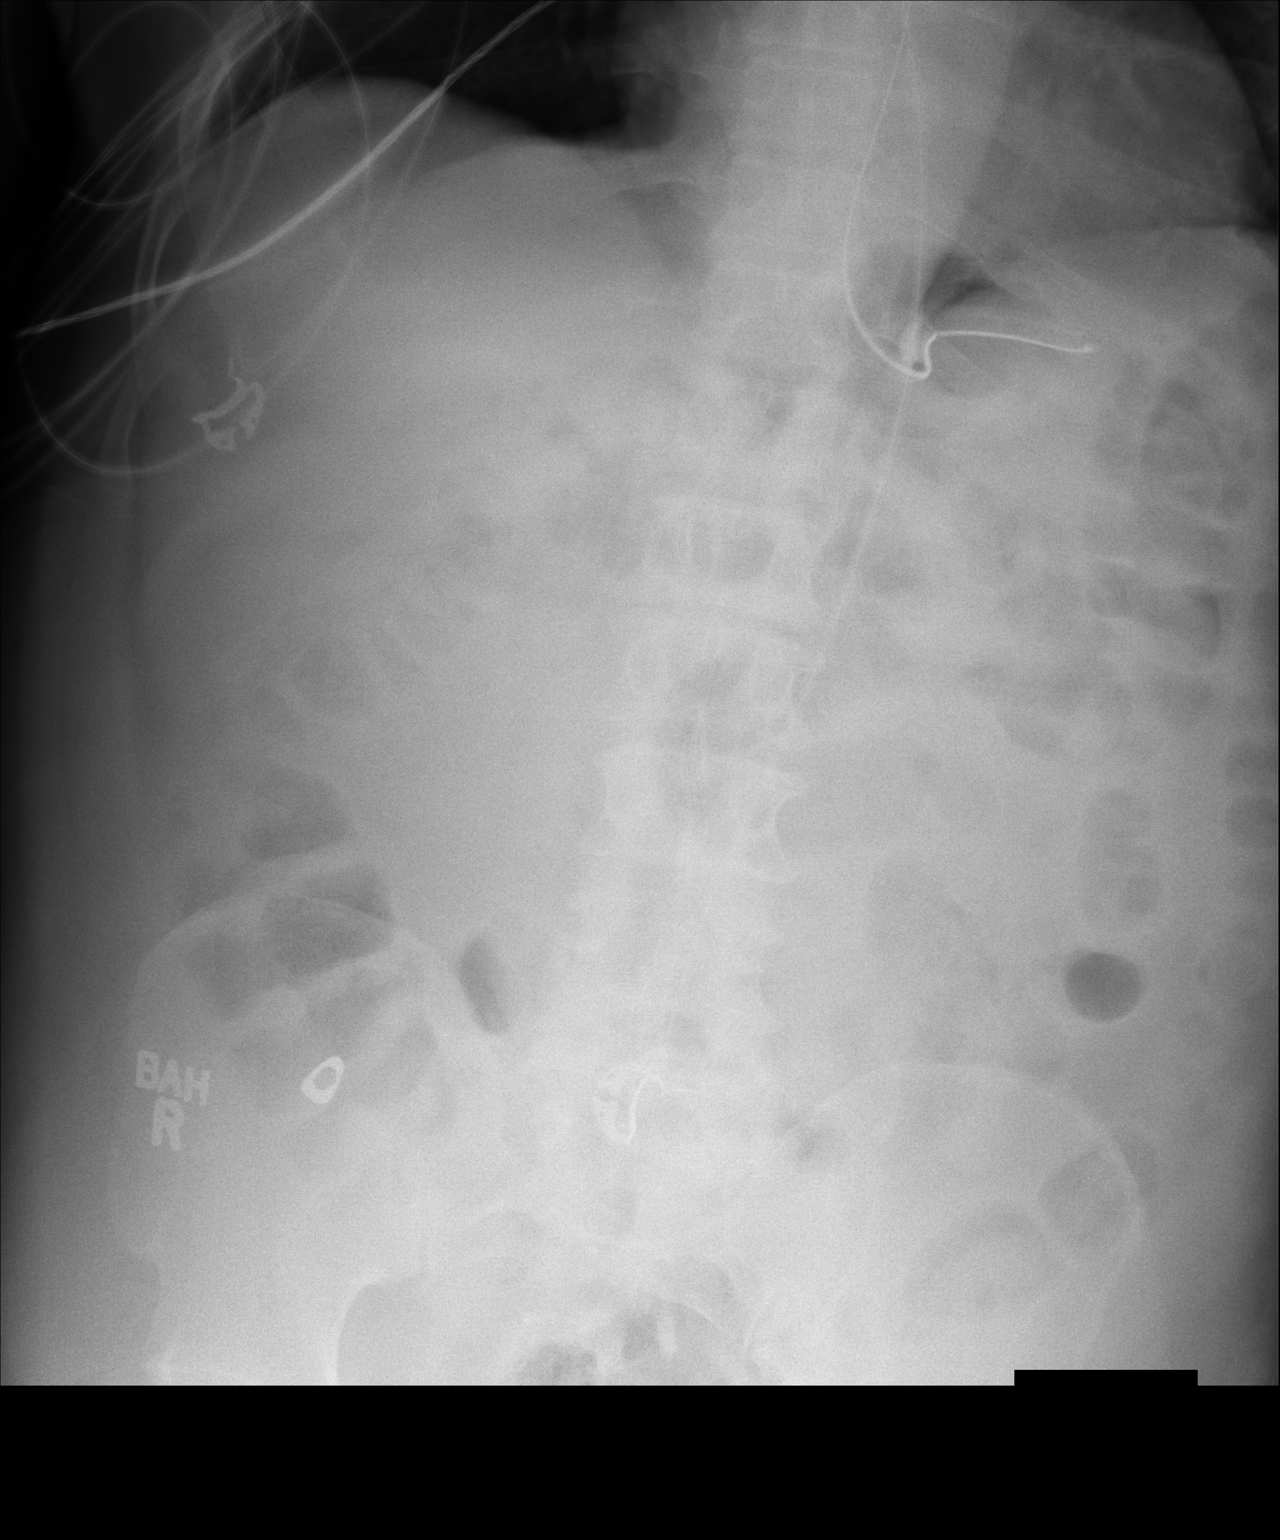

[1 of 1 positions shown; findings below may reference images not displayed]

FINDINGS: 8691 hours.  Nasogastric terminates at the proximal
stomach.  Otherwise motion degraded exam, with decrease in gas
filled small bowel loops.  No gross free intraperitoneal air.
IMPRESSION: Nasogastric tube terminating at the proximal stomach.

## 2013-10-04 IMAGING — CR DG ABD PORTABLE 1V
1 series · 1 of 1 positions shown · non-contrast
Comparison: 12/19/2011

CLINICAL DATA: NG not working

PORTABLE ABDOMEN - 1 VIEW

[AP]
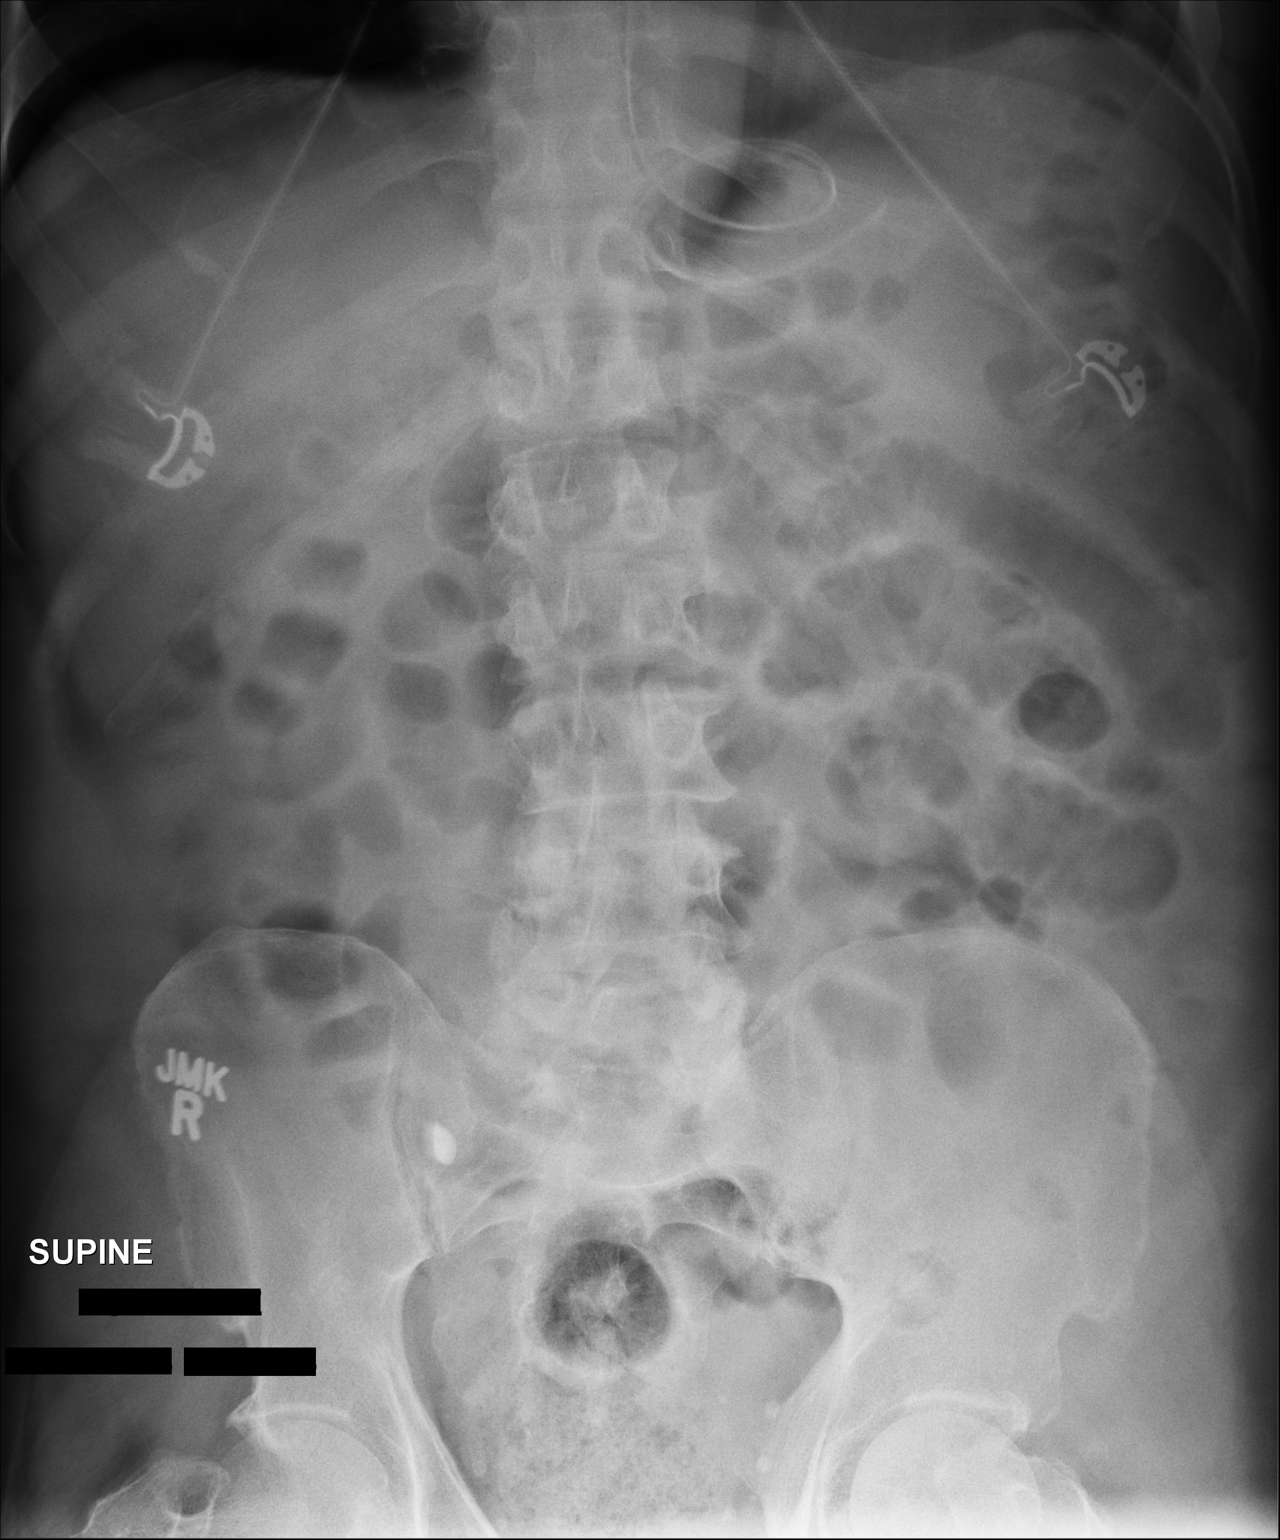

[1 of 1 positions shown; findings below may reference images not displayed]

FINDINGS: NG tube is coiled in the distal esophagus and may extend
into the gastric fundus.  Consider advancing the NG tube.

Increase in small bowel gas compared with the prior study.  Small
bowel is mildly distended.  There is gas in nondilated colon.
There is stool in the rectum.
IMPRESSION: NG tube has been pulled back and is now coiled in the distal
esophagus and proximal stomach.  Increase in small bowel gas.

## 2013-10-09 DIAGNOSIS — M25569 Pain in unspecified knee: Secondary | ICD-10-CM | POA: Diagnosis not present

## 2013-10-09 DIAGNOSIS — R262 Difficulty in walking, not elsewhere classified: Secondary | ICD-10-CM | POA: Diagnosis not present

## 2013-10-09 DIAGNOSIS — M6281 Muscle weakness (generalized): Secondary | ICD-10-CM | POA: Diagnosis not present

## 2013-10-13 DIAGNOSIS — M1710 Unilateral primary osteoarthritis, unspecified knee: Secondary | ICD-10-CM | POA: Diagnosis not present

## 2013-10-16 DIAGNOSIS — M25569 Pain in unspecified knee: Secondary | ICD-10-CM | POA: Diagnosis not present

## 2013-10-16 DIAGNOSIS — M6281 Muscle weakness (generalized): Secondary | ICD-10-CM | POA: Diagnosis not present

## 2013-10-16 DIAGNOSIS — R262 Difficulty in walking, not elsewhere classified: Secondary | ICD-10-CM | POA: Diagnosis not present

## 2013-10-28 ENCOUNTER — Telehealth: Payer: Self-pay | Admitting: Cardiovascular Disease

## 2013-10-28 MED ORDER — CARVEDILOL 3.125 MG PO TABS: 3.1250 mg | ORAL_TABLET | Freq: Two times a day (BID) | ORAL | Status: DC

## 2013-10-28 NOTE — Telephone Encounter (Signed)
Refill complete 

## 2013-10-28 NOTE — Telephone Encounter (Signed)
Received fax refill request  Rx # [id number] Medication:  Carvedilol 3.125 mg tab Qty 60 Sig:  Take one tablet by mouth twice daily Physician:  Bronson Ing

## 2013-11-03 DIAGNOSIS — R030 Elevated blood-pressure reading, without diagnosis of hypertension: Secondary | ICD-10-CM | POA: Diagnosis not present

## 2013-11-03 DIAGNOSIS — Z6829 Body mass index (BMI) 29.0-29.9, adult: Secondary | ICD-10-CM | POA: Diagnosis not present

## 2013-11-03 DIAGNOSIS — I10 Essential (primary) hypertension: Secondary | ICD-10-CM | POA: Diagnosis not present

## 2013-12-01 ENCOUNTER — Other Ambulatory Visit: Payer: Self-pay | Admitting: *Deleted

## 2013-12-01 DIAGNOSIS — M25562 Pain in left knee: Secondary | ICD-10-CM

## 2013-12-01 MED ORDER — TRAMADOL HCL 50 MG PO TABS: 50.0000 mg | ORAL_TABLET | Freq: Four times a day (QID) | ORAL | Status: DC | PRN

## 2013-12-02 DIAGNOSIS — E1140 Type 2 diabetes mellitus with diabetic neuropathy, unspecified: Secondary | ICD-10-CM | POA: Diagnosis not present

## 2013-12-02 DIAGNOSIS — E1142 Type 2 diabetes mellitus with diabetic polyneuropathy: Secondary | ICD-10-CM | POA: Diagnosis not present

## 2013-12-10 DIAGNOSIS — S83412A Sprain of medial collateral ligament of left knee, initial encounter: Secondary | ICD-10-CM | POA: Diagnosis not present

## 2013-12-10 DIAGNOSIS — S8992XA Unspecified injury of left lower leg, initial encounter: Secondary | ICD-10-CM | POA: Diagnosis not present

## 2013-12-10 DIAGNOSIS — M25562 Pain in left knee: Secondary | ICD-10-CM | POA: Diagnosis not present

## 2013-12-10 DIAGNOSIS — W01190A Fall on same level from slipping, tripping and stumbling with subsequent striking against furniture, initial encounter: Secondary | ICD-10-CM | POA: Diagnosis not present

## 2013-12-10 DIAGNOSIS — M7989 Other specified soft tissue disorders: Secondary | ICD-10-CM | POA: Diagnosis not present

## 2013-12-10 DIAGNOSIS — Z96652 Presence of left artificial knee joint: Secondary | ICD-10-CM | POA: Diagnosis not present

## 2013-12-15 DIAGNOSIS — S335XXA Sprain of ligaments of lumbar spine, initial encounter: Secondary | ICD-10-CM | POA: Diagnosis not present

## 2013-12-15 DIAGNOSIS — M25562 Pain in left knee: Secondary | ICD-10-CM | POA: Diagnosis not present

## 2013-12-15 DIAGNOSIS — M25551 Pain in right hip: Secondary | ICD-10-CM | POA: Diagnosis not present

## 2013-12-30 ENCOUNTER — Ambulatory Visit (HOSPITAL_COMMUNITY)
Admission: RE | Admit: 2013-12-30 | Discharge: 2013-12-30 | Disposition: A | Discharge status: Home / Self Care | Payer: BC Managed Care – PPO | Source: Ambulatory Visit | Attending: Family Medicine | Admitting: Family Medicine

## 2013-12-30 DIAGNOSIS — M25562 Pain in left knee: Secondary | ICD-10-CM

## 2013-12-30 DIAGNOSIS — R262 Difficulty in walking, not elsewhere classified: Secondary | ICD-10-CM

## 2013-12-30 DIAGNOSIS — R278 Other lack of coordination: Secondary | ICD-10-CM | POA: Diagnosis not present

## 2013-12-30 DIAGNOSIS — M25662 Stiffness of left knee, not elsewhere classified: Secondary | ICD-10-CM | POA: Diagnosis not present

## 2013-12-30 DIAGNOSIS — Z5189 Encounter for other specified aftercare: Secondary | ICD-10-CM | POA: Insufficient documentation

## 2013-12-30 NOTE — Patient Instructions (Signed)
Hamstring Stretch: Active   Support behind right knee. Starting with knee bent, attempt to straighten knee until a comfortable stretch is felt in back of thigh. Hold ____ seconds. Repeat ____ times per set. Do ____ sets per session. Do ____ sessions per day.  http://orth.exer.us/158   Copyright  VHI. All rights reserved.  Quad Set   With other leg bent, foot flat, slowly tighten muscles on thigh of straight leg while counting out loud to ____. Repeat with other leg. Repeat ____ times. Do ____ sessions per day.  http://gt2.exer.us/275   Copyright  VHI. All rights reserved.  Self-Mobilization: Heel Slide (Supine)   Slide left heel toward buttocks until a gentle stretch is felt. Hold ____ seconds. Relax. Repeat ____ times per set. Do ____ sets per session. Do ____ sessions per day.  http://orth.exer.us/710   Copyright  VHI. All rights reserved.  Self-Mobilization: Heel Slide (Supine)   Slide left heel toward buttocks until a gentle stretch is felt. Hold ____ seconds. Relax. Repeat ____ times per set. Do ____ sets per session. Do ____ sessions per day.  http://orth.exer.us/710   Copyright  VHI. All rights reserved.  Self-Mobilization: Knee Flexion (Prone)   Bring left heel toward buttocks as close as possible. Hold ____ seconds. Relax. Repeat ____ times per set. Do ____ sets per session. Do ____ sessions per day.  http://orth.exer.us/596   Copyright  VHI. All rights reserved.

## 2013-12-30 NOTE — Therapy (Addendum)
Physical Therapy Evaluation  Patient Details  Name: Brendan Scott MRN: [id number] Date of Birth: [birth year]  Encounter Date: 12/30/2013      PT End of Session - 12/30/13 1021    Visit Number 1   Number of Visits 12   Date for PT Re-Evaluation 01/29/14   Authorization Type medicare   Authorization - Visit Number 1   Authorization - Number of Visits 10   PT Start Time 0935   PT Stop Time 1015   PT Time Calculation (min) 40 min      Past Medical History  Diagnosis Date  . Arthritis   . Diabetes mellitus   . Atrial fibrillation   . HTN (hypertension)   . Stroke   . Seizure disorder     Past Surgical History  Procedure Laterality Date  . Total knee arthroplasty      Bilateral    There were no vitals taken for this visit.  Visit Diagnosis:  Knee pain, acute, left  Stiffness of left knee  Difficulty walking down stairs      Subjective Assessment - 12/30/13 0943    Symptoms Brendan Scott states that his Lt knee has been bothering him for about a year but it has gotten to a point that he is having getting up his stairs and bending his knee thererfore he was referred to therapy.  He has had a TKR on this knee 5 years ago and had a Rt CVA 3 years ago.    How long can you sit comfortably? no problem    How long can you stand comfortably? able to stand without difficulty    How long can you walk comfortably? Pt limps after walking for a short period of time.    Currently in Pain? Yes  7/10 at the worst; 5 is the best it gets.    Pain Score 5    Pain Location Knee   Pain Orientation Left;Medial          [facility] PT Assessment - 12/30/13 0950    Assessment   Medical Diagnosis Lt knee pain   Onset Date 01/14/14   Prior Therapy none   Precautions   Precautions None   Restrictions   Weight Bearing Restrictions No   Balance Screen   Has the patient fallen in the past 6 months No   Has the patient had a decrease in activity level because of a fear of falling?  No   Is the  patient reluctant to leave their home because of a fear of falling?  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    Leisure fish, yard work    Charity fundraiser Status Within Functional Limits for tasks assessed   Observation/Other Assessments   Focus on Therapeutic Outcomes (FOTO)  83   AROM   Left Knee Extension 12   Left Knee Flexion 60   Strength   Left Hip Flexion 5/5   Left Hip Extension 3+/5   Left Hip ABduction 5/5   Left Knee Flexion 5/5   Left Knee Extension 5/5   Left Ankle Dorsiflexion 4/5   Left Ankle Plantar Flexion 3+/5   Flexibility   Hamstrings 90/90  24 degrees    Quadriceps heel to buttock = 56 cm           [facility] Adult PT Treatment/Exercise - 12/30/13 1033    Exercises   Exercises  Knee/Hip   Knee/Hip Exercises: Stretches   Active Hamstring Stretch 3 reps;30 seconds   Knee/Hip Exercises: Supine   Quad Sets Left;10 reps   Heel Slides 10 reps   Knee/Hip Exercises: Prone   Hamstring Curl 10 reps   Hip Extension 5 reps            PT Short Term Goals - 2014-01-22 1031    PT SHORT TERM GOAL #3   Title Pain level to be no greater than a 6/10 80% of the day           PT Long Term Goals - 2014/01/22 1029    PT LONG TERM GOAL #1   Title Pt to be I in advance HEP   Time 4   Period Weeks   PT LONG TERM GOAL #2   Title Pt pain to be no greater than a 3/10 80% of the day    Time 4   Period Weeks   PT LONG TERM GOAL #3   Title Pt ROM to be 4 to 90 degrees to allow a normalized gait   Time 4   Period Weeks   PT LONG TERM GOAL #4   Title hip extension increased one grade to allow pt to go up and down steps without difficulty   Time 4   Period Weeks          Plan - 2014/01/22 1044    Clinical Impression Statement Brendan Scott is a 68 yo male who has significant decreased ROM and weakened gluteal maximus, and gastroc strength who has noted a limp with ambulation as well as  difficulty going up and down steps and increased pain. 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 He has been referred and will benefit from skilled PT to address these issues and maximize his functional capacity   Pt will benefit from skilled therapeutic intervention in order to improve on the following deficits Decreased range of motion;Decreased strength;Difficulty walking;Pain   PT Treatment/Interventions Stair training;Therapeutic activities;Therapeutic exercise;Manual techniques;Patient/family education;Functional mobility training;Passive range of motion;Gait training   PT Home Exercise Plan given    Plan:  Focus treatment on ROM not strengthening at this time.       G-Codes - 01/22/14 1032    Functional Assessment Tool Used foto/ clinical judgement    Functional Limitation Mobility: Walking and moving around   Mobility: Walking and Moving Around Current Status [phone]) At least 20 percent but less than 40 percent impaired, limited or restricted   Mobility: Walking and Moving Around Goal Status [phone]) At least 1 percent but less than 20 percent impaired, limited or restricted      Problem List Patient Active Problem List   Diagnosis Date Noted  . A-fib 02/29/2012  . Muscle weakness (generalized) 02/21/2012  . Lack of coordination 02/21/2012  . CVA (cerebral infarction) 12/28/2011  . Respiratory distress 12/25/2011  . Aspiration into respiratory tract 12/24/2011  . Embolic stroke involving middle cerebral artery 12/21/2011  . Focal motor seizure 12/16/2011  . Pain, knee 10/18/2010  . S/P total knee replacement 10/18/2010  . Diabetes mellitus   . OSTEOARTHRITIS, KNEE, LEFT 07/12/2009  . TOTAL KNEE FOLLOW-UP 08/13/2007  . JOINT EFFUSION, KNEE 06/17/2007  . OSTEOARTHRITIS, LOWER LEG 12/05/2006  . Pain in Joint, Lower Leg 12/05/2006  . SCIATICA, RIGHT 12/05/2006  . DIABETES 10/31/2006          Azucena Freed  PT/CLT [phone]  12/30/2013, 10:45 AM

## 2014-01-02 ENCOUNTER — Ambulatory Visit (HOSPITAL_COMMUNITY): Admission: RE | Admit: 2014-01-02 | Payer: BC Managed Care – PPO | Source: Ambulatory Visit

## 2014-01-05 ENCOUNTER — Ambulatory Visit (HOSPITAL_COMMUNITY)
Admission: RE | Admit: 2014-01-05 | Discharge: 2014-01-05 | Disposition: A | Discharge status: Home / Self Care | Payer: BC Managed Care – PPO | Source: Ambulatory Visit | Attending: Family Medicine | Admitting: Family Medicine

## 2014-01-05 DIAGNOSIS — R278 Other lack of coordination: Secondary | ICD-10-CM | POA: Diagnosis not present

## 2014-01-05 DIAGNOSIS — R262 Difficulty in walking, not elsewhere classified: Secondary | ICD-10-CM

## 2014-01-05 DIAGNOSIS — M25562 Pain in left knee: Secondary | ICD-10-CM | POA: Diagnosis not present

## 2014-01-05 DIAGNOSIS — Z5189 Encounter for other specified aftercare: Secondary | ICD-10-CM | POA: Diagnosis not present

## 2014-01-05 DIAGNOSIS — M25662 Stiffness of left knee, not elsewhere classified: Secondary | ICD-10-CM | POA: Diagnosis not present

## 2014-01-05 NOTE — Addendum Note (Signed)
Encounter addended by: Leeroy Cha, PT on: 01/05/2014 11:05 AM<BR>     Documentation filed: Clinical Notes

## 2014-01-05 NOTE — Therapy (Signed)
[facility] [address] [place], [place], [address] Phone: [phone]   Fax:  [phone]  Physical Therapy Treatment  Patient Details  Name: Brendan Scott MRN: [id number] Date of Birth: [birth year]  Encounter Date: 01/05/2014      PT End of Session - 01/05/14 1108    Visit Number 2   Number of Visits 12   Date for PT Re-Evaluation 01/29/14   Authorization Type medicare   Authorization - Visit Number 2   Authorization - Number of Visits 10   PT Start Time 3710   PT Stop Time 1056   PT Time Calculation (min) 41 min   Activity Tolerance Patient tolerated treatment well      Past Medical History  Diagnosis Date  . Arthritis   . Diabetes mellitus   . Atrial fibrillation   . HTN (hypertension)   . Stroke   . Seizure disorder     Past Surgical History  Procedure Laterality Date  . Total knee arthroplasty      Bilateral    There were no vitals taken for this visit.  Visit Diagnosis:  Knee pain, acute, left  Stiffness of left knee  Difficulty walking down stairs      Subjective Assessment - 01/05/14 1018    Symptoms Pt states he has been doing his exercise program and has no questions.    How long can you sit comfortably? no problem    How long can you stand comfortably? able to stand without difficulty    How long can you walk comfortably? Pt limps after walking for a short period of time.    Currently in Pain? Yes   Pain Score 5    Pain Location Knee   Pain Orientation Left;Medial   Pain Descriptors / Indicators Aching            [facility] Adult PT Treatment/Exercise - 01/05/14 1023    Exercises   Exercises Knee/Hip   Knee/Hip Exercises: Stretches   Active Hamstring Stretch 3 reps;30 seconds   Active Hamstring Stretch Limitations onto 12' box   Quad Stretch 3 reps;30 seconds   Knee/Hip Exercises: Standing   Heel Raises 10 reps   Knee Flexion AROM;Left   Forward Lunges Left;10 reps   Forward Lunges Limitations onto  12" box to increase motion    Lateral Step Up --   Forward Step Up --   Functional Squat 10 reps   Rocker Board 2 minutes   SLS x1'   Knee/Hip Exercises: Supine   Quad Sets 10 reps   Heel Slides 10 reps   Patellar Mobs all   Knee Extension PROM;Left   Knee Extension Limitations 30" x 3    Knee/Hip Exercises: Prone   Hamstring Curl 10 reps   Contract/Relax to Increase Flexion x5   Other Prone Exercises terminal knee extension            PT Short Term Goals - 01/05/14 1111    PT SHORT TERM GOAL #1   Title Pt to be I in HEP   Time 2   Period Weeks   Status On-going   PT SHORT TERM GOAL #2   Title Pt ROM to be improved to 8-75 degrees to allow a more normalized gait and easier descent of steps    Time 2   Period Weeks   Status On-going   PT SHORT TERM GOAL #3   Title Pain level to be no greater than a 6/10 80% of  the day    Time 2   Period Weeks   Status On-going            Plan - 01/05/14 1109    Clinical Impression Statement Pt treatment focused on increasing ROM.  Pt demonstrated improved flexion following treatment.    Pt will benefit from skilled therapeutic intervention in order to improve on the following deficits Decreased range of motion;Decreased strength;Difficulty walking;Pain   Rehab Potential Good   PT Frequency 2x / week   PT Duration 4 weeks   PT Treatment/Interventions Stair training;Therapeutic activities;Therapeutic exercise;Manual techniques;Patient/family education;Functional mobility training;Passive range of motion;Gait training   PT Next Visit Plan begin bike but do last to allow time to complete other activity.         Problem List Patient Active Problem List   Diagnosis Date Noted  . A-fib 02/29/2012  . Muscle weakness (generalized) 02/21/2012  . Lack of coordination 02/21/2012  . CVA (cerebral infarction) 12/28/2011  . Respiratory distress 12/25/2011  . Aspiration into respiratory tract 12/24/2011  . Embolic stroke involving  middle cerebral artery 12/21/2011  . Focal motor seizure 12/16/2011  . Pain, knee 10/18/2010  . S/P total knee replacement 10/18/2010  . Diabetes mellitus   . OSTEOARTHRITIS, KNEE, LEFT 07/12/2009  . TOTAL KNEE FOLLOW-UP 08/13/2007  . JOINT EFFUSION, KNEE 06/17/2007  . OSTEOARTHRITIS, LOWER LEG 12/05/2006  . Pain in Joint, Lower Leg 12/05/2006  . SCIATICA, RIGHT 12/05/2006  . DIABETES 10/31/2006  Azucena Freed PT/CLT [phone]  01/05/2014, 11:12 AM

## 2014-01-08 ENCOUNTER — Ambulatory Visit (HOSPITAL_COMMUNITY)
Admission: RE | Admit: 2014-01-08 | Discharge: 2014-01-08 | Disposition: A | Discharge status: Home / Self Care | Payer: BC Managed Care – PPO | Source: Ambulatory Visit | Attending: Family Medicine | Admitting: Family Medicine

## 2014-01-08 DIAGNOSIS — M25562 Pain in left knee: Secondary | ICD-10-CM | POA: Diagnosis not present

## 2014-01-08 DIAGNOSIS — R262 Difficulty in walking, not elsewhere classified: Secondary | ICD-10-CM

## 2014-01-08 DIAGNOSIS — M25662 Stiffness of left knee, not elsewhere classified: Secondary | ICD-10-CM | POA: Diagnosis not present

## 2014-01-08 DIAGNOSIS — Z5189 Encounter for other specified aftercare: Secondary | ICD-10-CM | POA: Diagnosis not present

## 2014-01-08 DIAGNOSIS — R278 Other lack of coordination: Secondary | ICD-10-CM | POA: Diagnosis not present

## 2014-01-08 NOTE — Therapy (Signed)
[facility] [address] [place], [place], [address] Phone: [phone]   Fax:  [phone]  Physical Therapy Treatment  Patient Details  Name: Brendan Scott MRN: [id number] Date of Birth: 12/06/1945  Encounter Date: 01/08/2014      PT End of Session - 01/08/14 1011    Visit Number 3   Number of Visits 12   Date for PT Re-Evaluation 01/29/14   Authorization Type medicare   Authorization - Visit Number 3   Authorization - Number of Visits 10   PT Start Time [phone]   PT Stop Time 1016   PT Time Calculation (min) 38 min   Activity Tolerance Patient tolerated treatment well      Past Medical History  Diagnosis Date  . Arthritis   . Diabetes mellitus   . Atrial fibrillation   . HTN (hypertension)   . Stroke   . Seizure disorder     Past Surgical History  Procedure Laterality Date  . Total knee arthroplasty      Bilateral    There were no vitals taken for this visit.  Visit Diagnosis:  Knee pain, acute, left  Stiffness of left knee  Difficulty walking down stairs      Subjective Assessment - 01/08/14 0944    Symptoms Pt states he continues to hurt, currently 6/10 pain in Lt knee.   Currently in Pain? Yes   Pain Score 6    Pain Location Knee   Pain Orientation Left            [facility] Adult PT Treatment/Exercise - 01/08/14 0946    Knee/Hip Exercises: Stretches   Active Hamstring Stretch 3 reps;30 seconds   Active Hamstring Stretch Limitations onto 14' box   Quad Stretch 3 reps;30 seconds   Knee: Self-Stretch to increase Flexion 10 seconds   Knee: Self-Stretch Limitations 10 reps stretch onto 12" box   Gastroc Stretch 3 reps;30 seconds;Limitations   Gastroc Stretch Limitations onto slant board   Knee/Hip Exercises: Standing   Heel Raises 15 reps   Knee Flexion 10 reps;Left   Forward Lunges Left;10 reps   Forward Lunges Limitations onto 4" box   Functional Squat 10 reps   Rocker Board 2 minutes   Knee/Hip Exercises:  Supine   Quad Sets 10 reps   Patellar Mobs all   Knee Extension PROM;Left   Knee Extension Limitations 30" x 3    Knee/Hip Exercises: Prone   Hamstring Curl 10 reps   Hip Extension 10 reps;Left   Contract/Relax to Increase Flexion x5   Other Prone Exercises terminal knee ext Lt LE 10 reps   Manual Therapy   Manual Therapy Joint mobilization   Joint Mobilization PROM for flexion and extension, Myofascial techniques to medial knee, patellar mobs            PT Short Term Goals - 01/08/14 1029    PT SHORT TERM GOAL #1   Title Pt to be I in HEP   Time 2   Period Weeks   Status On-going   PT SHORT TERM GOAL #2   Title Pt ROM to be improved to 8-75 degrees to allow a more normalized gait and easier descent of steps    Time 2   Period Weeks   Status On-going   PT SHORT TERM GOAL #3   Title Pain level to be no greater than a 6/10 80% of the day    Time 2   Period Weeks   Status  On-going          PT Long Term Goals - 01/08/14 1029    PT LONG TERM GOAL #1   Title Pt to be I in advance HEP   Time 4   Period Weeks   Status On-going   PT LONG TERM GOAL #2   Title Pt pain to be no greater than a 3/10 80% of the day    Time 4   Period Weeks   Status On-going   PT LONG TERM GOAL #3   Title Pt ROM to be 4 to 90 degrees to allow a normalized gait   Time 4   Period Weeks   Status On-going   PT LONG TERM GOAL #4   Title hip extension increased one grade to allow pt to go up and down steps without difficulty   Time 4   Period Weeks   Status On-going          Plan - 01/08/14 1015    Clinical Impression Statement Continued focus on increasing Lt knee ROM and stability.  PT able to complete with minimal discomfort.  Most pain with PROM for flexion and extension.  Hard endfeel for flexion/painful endfeel for extension.     PT Next Visit Plan begin bike but do last to allow time to complete other activity. Progress therex to meet goals.         Problem List Patient  Active Problem List   Diagnosis Date Noted  . A-fib 02/29/2012  . Muscle weakness (generalized) 02/21/2012  . Lack of coordination 02/21/2012  . CVA (cerebral infarction) 12/28/2011  . Respiratory distress 12/25/2011  . Aspiration into respiratory tract 12/24/2011  . Embolic stroke involving middle cerebral artery 12/21/2011  . Focal motor seizure 12/16/2011  . Pain, knee 10/18/2010  . S/P total knee replacement 10/18/2010  . Diabetes mellitus   . OSTEOARTHRITIS, KNEE, LEFT 07/12/2009  . TOTAL KNEE FOLLOW-UP 08/13/2007  . JOINT EFFUSION, KNEE 06/17/2007  . OSTEOARTHRITIS, LOWER LEG 12/05/2006  . Pain in Joint, Lower Leg 12/05/2006  . SCIATICA, RIGHT 12/05/2006  . DIABETES 10/31/2006    Teena Irani, PTA/CLT [phone] 01/08/2014, 10:34 AM

## 2014-01-13 ENCOUNTER — Ambulatory Visit (HOSPITAL_COMMUNITY)
Admission: RE | Admit: 2014-01-13 | Discharge: 2014-01-13 | Disposition: A | Discharge status: Home / Self Care | Payer: BC Managed Care – PPO | Source: Ambulatory Visit | Attending: Family Medicine | Admitting: Family Medicine

## 2014-01-13 DIAGNOSIS — M25662 Stiffness of left knee, not elsewhere classified: Secondary | ICD-10-CM | POA: Diagnosis not present

## 2014-01-13 DIAGNOSIS — R278 Other lack of coordination: Secondary | ICD-10-CM | POA: Diagnosis not present

## 2014-01-13 DIAGNOSIS — Z5189 Encounter for other specified aftercare: Secondary | ICD-10-CM | POA: Diagnosis not present

## 2014-01-13 DIAGNOSIS — M25562 Pain in left knee: Secondary | ICD-10-CM | POA: Diagnosis not present

## 2014-01-13 DIAGNOSIS — R262 Difficulty in walking, not elsewhere classified: Secondary | ICD-10-CM

## 2014-01-13 NOTE — Therapy (Signed)
[facility] [address] [place], [place], [address] Phone: [phone]   Fax:  [phone]  Physical Therapy Treatment  Patient Details  Name: Brendan Scott MRN: [id number] Date of Birth: [birth year]  Encounter Date: 01/13/2014      PT End of Session - 01/13/14 0843    Visit Number 4   Number of Visits 12   Date for PT Re-Evaluation 01/29/14   Authorization Type medicare   Authorization - Visit Number 4   Authorization - Number of Visits 10   PT Start Time 0800   PT Stop Time 0850   PT Time Calculation (min) 50 min      Past Medical History  Diagnosis Date  . Arthritis   . Diabetes mellitus   . Atrial fibrillation   . HTN (hypertension)   . Stroke   . Seizure disorder     Past Surgical History  Procedure Laterality Date  . Total knee arthroplasty      Bilateral    There were no vitals taken for this visit.  Visit Diagnosis:  Knee pain, acute, left  Stiffness of left knee  Difficulty walking down stairs      Subjective Assessment - 01/13/14 0848    Symptoms Pt states that he is having more soreness than any pain today.   Pt states that his pain is mostly at night.    How long can you sit comfortably? no problem    How long can you stand comfortably? able to stand without difficulty    How long can you walk comfortably? Pt limps after walking for a short period of time.    Currently in Pain? No/denies   Pain Location Knee   Pain Orientation Left            [facility] Adult PT Treatment/Exercise - 01/13/14 0814    Knee/Hip Exercises: Stretches   Active Hamstring Stretch 3 reps;30 seconds   Active Hamstring Stretch Limitations supine to work on bending as well    Passive Hamstring Stretch 1 rep;60 seconds   Passive Hamstring Stretch Limitations long sitting    Quad Stretch 3 reps;30 seconds   Knee/Hip Exercises: Aerobic   Stationary Bike x 10:00 rocking to improve ROM    Knee/Hip Exercises: Standing   Heel Raises 10  reps   Heel Raises Limitations away from // bars combo with functional squat    Knee Flexion 10 reps   Knee Flexion Limitations off 2" box to increase flexion    Functional Squat 10 reps   Rocker Board 2 minutes   SLS with Vectors x 10 forward and back    Knee/Hip Exercises: Seated   Other Seated Knee Exercises stool scoot x 30 ft    Knee/Hip Exercises: Supine   Quad Sets 10 reps   Short Arc Quad Sets 10 reps   Heel Slides 10 reps   Patellar Mobs all   Knee Extension PROM   Knee Extension Limitations 30": x 3   Knee/Hip Exercises: Prone   Hamstring Curl 15 reps   Hip Extension 15 reps   Contract/Relax to Increase Flexion 10   Manual Therapy   Joint Mobilization fibular femoral/ tibiafemoral             PT Short Term Goals - 01/13/14 0845    PT SHORT TERM GOAL #1   Title Pt to be I in HEP   Time 2   Period Weeks   Status Achieved   PT SHORT TERM  GOAL #2   Title Pt ROM to be improved to 8-75 degrees to allow a more normalized gait and easier descent of steps    Time 2   Period Weeks   Status On-going   PT SHORT TERM GOAL #3   Title Pain level to be no greater than a 6/10 80% of the day    Time 2   Period Weeks   Status On-going          PT Long Term Goals - 01/13/14 0845    PT LONG TERM GOAL #1   Title Pt to be I in advance HEP   Time 4   Period Weeks   Status On-going   PT LONG TERM GOAL #2   Title Pt pain to be no greater than a 3/10 80% of the day    Time 4   Period Weeks   Status On-going   PT LONG TERM GOAL #3   Title Pt ROM to be 4 to 90 degrees to allow a normalized gait   Time 4   Period Weeks   Status On-going   PT LONG TERM GOAL #4   Title hip extension increased one grade to allow pt to go up and down steps without difficulty   Time 4   Period Weeks   Status On-going          Plan - 01/13/14 0843    Clinical Impression Statement Added bike and stool scoots to improve ROM.  Flexion is at a hard stop pt may benefit from JAS flexion  bracing system. Will contact MD to see if he agrees.     PT Next Visit Plan begin wall sits and flexion on wall            Problem List Patient Active Problem List   Diagnosis Date Noted  . A-fib 02/29/2012  . Muscle weakness (generalized) 02/21/2012  . Lack of coordination 02/21/2012  . CVA (cerebral infarction) 12/28/2011  . Respiratory distress 12/25/2011  . Aspiration into respiratory tract 12/24/2011  . Embolic stroke involving middle cerebral artery 12/21/2011  . Focal motor seizure 12/16/2011  . Pain, knee 10/18/2010  . S/P total knee replacement 10/18/2010  . Diabetes mellitus   . OSTEOARTHRITIS, KNEE, LEFT 07/12/2009  . TOTAL KNEE FOLLOW-UP 08/13/2007  . JOINT EFFUSION, KNEE 06/17/2007  . OSTEOARTHRITIS, LOWER LEG 12/05/2006  . Pain in Joint, Lower Leg 12/05/2006  . SCIATICA, RIGHT 12/05/2006  . DIABETES 10/31/2006  Azucena Freed PT/CLT [phone]  01/13/2014, 8:48 AM

## 2014-01-14 ENCOUNTER — Ambulatory Visit (HOSPITAL_COMMUNITY)
Admission: RE | Admit: 2014-01-14 | Payer: BC Managed Care – PPO | Source: Ambulatory Visit | Attending: Family Medicine | Admitting: Family Medicine

## 2014-01-16 ENCOUNTER — Ambulatory Visit (HOSPITAL_COMMUNITY)
Admission: RE | Admit: 2014-01-16 | Discharge: 2014-01-16 | Disposition: A | Discharge status: Home / Self Care | Payer: BC Managed Care – PPO | Source: Ambulatory Visit | Attending: Family Medicine | Admitting: Family Medicine

## 2014-01-16 DIAGNOSIS — M25662 Stiffness of left knee, not elsewhere classified: Secondary | ICD-10-CM

## 2014-01-16 DIAGNOSIS — M25562 Pain in left knee: Secondary | ICD-10-CM | POA: Diagnosis not present

## 2014-01-16 DIAGNOSIS — R262 Difficulty in walking, not elsewhere classified: Secondary | ICD-10-CM

## 2014-01-16 DIAGNOSIS — Z5189 Encounter for other specified aftercare: Secondary | ICD-10-CM | POA: Diagnosis not present

## 2014-01-16 DIAGNOSIS — R278 Other lack of coordination: Secondary | ICD-10-CM | POA: Diagnosis not present

## 2014-01-16 NOTE — Therapy (Signed)
[place], [place], [address] Phone: [phone]   Fax:  [phone]  Physical Therapy Treatment  Patient Details  Name: Brendan Scott MRN: [id number] Date of Birth: 1946-01-02  Encounter Date: 01/16/2014      PT End of Session - 01/16/14 0932    Visit Number 5   Number of Visits 12   Date for PT Re-Evaluation 01/29/14   Authorization Type medicare   Authorization - Visit Number 5   Authorization - Number of Visits 10   PT Start Time 0845   PT Stop Time 0940   PT Time Calculation (min) 55 min   Activity Tolerance Patient tolerated treatment well      Past Medical History  Diagnosis Date  . Arthritis   . Diabetes mellitus   . Atrial fibrillation   . HTN (hypertension)   . Stroke   . Seizure disorder     Past Surgical History  Procedure Laterality Date  . Total knee arthroplasty      Bilateral    There were no vitals taken for this visit.  Visit Diagnosis:  Knee pain, acute, left  Stiffness of left knee  Difficulty walking down stairs      Subjective Assessment - 01/16/14 0848    Symptoms Pt states he is sore from last treatment    Currently in Pain? Yes   Pain Score 4    Pain Location Knee   Pain Orientation Left;Medial   Pain Descriptors / Indicators Aching              [facility] Adult PT Treatment/Exercise - 01/16/14 0856    Exercises   Exercises Ankle   Knee/Hip Exercises: Stretches   Active Hamstring Stretch 3 reps;30 seconds   Quad Stretch 3 reps;30 seconds   Knee/Hip Exercises: Aerobic   Stationary Bike x 10'   Knee/Hip Exercises: Standing   Forward Lunges Left;10 reps   Forward Lunges Limitations on second step   Wall Squat 5 reps   SLS with Vectors 10" x 3    Knee/Hip Exercises: Seated   Other Seated Knee Exercises stool scoot x 30Ft RT    Knee/Hip Exercises: Supine   Terminal Knee Extension 10 reps   Knee Extension PROM   Other Supine Knee Exercises wall leg  slides 30sec hold pull down further repeat x 5    Knee/Hip Exercises: Prone   Hamstring Curl 10 reps   Hip Extension 10 reps   Contract/Relax to Increase Flexion x10   Manual Therapy   Joint Mobilization femoralfibular mob while in terminal flexion                   PT Short Term Goals - 01/16/14 0936    PT SHORT TERM GOAL #1   Title Pt to be I in HEP   Time 2   Period Weeks   Status Achieved   PT SHORT TERM GOAL #2   Title Pt ROM to be improved to 8-75 degrees to allow a more normalized gait and easier descent of steps    Time 2   Period Weeks   Status On-going   PT SHORT TERM GOAL #3   Title Pain level to be no greater than a 6/10 80% of the day    Period Weeks   Status On-going                  Plan - 01/16/14 0933    Clinical  Impression Statement ROM is still limited in flexion session concenrated on this aspect.  Flexion has increased fro 60 to 70 degrees.  Awaiting order for JAS system   Pt will benefit from skilled therapeutic intervention in order to improve on the following deficits Decreased range of motion;Decreased strength;Difficulty walking;Pain   PT Frequency 2x / week   PT Duration 4 weeks   PT Treatment/Interventions Stair training;Therapeutic activities;Therapeutic exercise;Manual techniques;Patient/family education;Functional mobility training;Passive range of motion;Gait training   PT Next Visit Plan begin reciprocal 4" step next treatment         Problem List Patient Active Problem List   Diagnosis Date Noted  . A-fib 02/29/2012  . Muscle weakness (generalized) 02/21/2012  . Lack of coordination 02/21/2012  . CVA (cerebral infarction) 12/28/2011  . Respiratory distress 12/25/2011  . Aspiration into respiratory tract 12/24/2011  . Embolic stroke involving middle cerebral artery 12/21/2011  . Focal motor seizure 12/16/2011  . Pain, knee 10/18/2010  . S/P total knee replacement 10/18/2010  . Diabetes mellitus   .  OSTEOARTHRITIS, KNEE, LEFT 07/12/2009  . TOTAL KNEE FOLLOW-UP 08/13/2007  . JOINT EFFUSION, KNEE 06/17/2007  . OSTEOARTHRITIS, LOWER LEG 12/05/2006  . Pain in Joint, Lower Leg 12/05/2006  . SCIATICA, RIGHT 12/05/2006  . DIABETES 10/31/2006  Azucena Freed PT/CLT [phone]  01/16/2014, 9:37 AM  [facility] [address] [place], [place], [address] Phone: [phone]   Fax:  [phone]

## 2014-01-19 ENCOUNTER — Ambulatory Visit (HOSPITAL_COMMUNITY)
Admission: RE | Admit: 2014-01-19 | Discharge: 2014-01-19 | Disposition: A | Discharge status: Home / Self Care | Payer: BC Managed Care – PPO | Source: Ambulatory Visit | Attending: Family Medicine | Admitting: Family Medicine

## 2014-01-19 DIAGNOSIS — Z5189 Encounter for other specified aftercare: Secondary | ICD-10-CM | POA: Diagnosis not present

## 2014-01-19 DIAGNOSIS — M25662 Stiffness of left knee, not elsewhere classified: Secondary | ICD-10-CM

## 2014-01-19 DIAGNOSIS — R278 Other lack of coordination: Secondary | ICD-10-CM | POA: Diagnosis not present

## 2014-01-19 DIAGNOSIS — M25562 Pain in left knee: Secondary | ICD-10-CM

## 2014-01-19 DIAGNOSIS — R262 Difficulty in walking, not elsewhere classified: Secondary | ICD-10-CM

## 2014-01-19 NOTE — Therapy (Signed)
[place], [place], [address] Phone: [phone]   Fax:  [phone]  Physical Therapy Treatment  Patient Details  Name: Brendan Scott MRN: [id number] Date of Birth: May 24, 1945  Encounter Date: 01/19/2014      PT End of Session - 01/19/14 0933    Visit Number 6   Number of Visits 12   Date for PT Re-Evaluation 01/29/14   Authorization Type medicare   Authorization - Visit Number 6   Authorization - Number of Visits 10   PT Start Time 0800   PT Stop Time 0848   PT Time Calculation (min) 48 min   Activity Tolerance Patient tolerated treatment well      Past Medical History  Diagnosis Date  . Arthritis   . Diabetes mellitus   . Atrial fibrillation   . HTN (hypertension)   . Stroke   . Seizure disorder     Past Surgical History  Procedure Laterality Date  . Total knee arthroplasty      Bilateral    There were no vitals taken for this visit.  Visit Diagnosis:  Knee pain, acute, left  Stiffness of left knee  Difficulty walking down stairs      Subjective Assessment - 01/19/14 0830    Symptoms Pt states he only has soreness today around his medial malleolus   Currently in Pain? No/denies                    [facility] Adult PT Treatment/Exercise - 01/19/14 0830    Knee/Hip Exercises: Stretches   Active Hamstring Stretch 3 reps;30 seconds   Active Hamstring Stretch Limitations supine to work on bending as well    Knee: Self-Stretch to increase Flexion 10 seconds   Knee: Self-Stretch Limitations 10 reps stretch onto 12" box   Knee/Hip Exercises: Aerobic   Stationary Bike x 10'   Knee/Hip Exercises: Standing   Heel Raises 10 reps   Heel Raises Limitations away from // bars combo with functional squat using red ball   Forward Lunges Left;10 reps   Forward Lunges Limitations on second step   Knee/Hip Exercises: Supine   Knee Extension PROM   Knee Flexion PROM   Manual Therapy   Manual Therapy Myofascial release;Joint mobilization   Joint Mobilization Lt knee to promote increased ROM   Myofascial Release Lt knee                  PT Short Term Goals - 01/19/14 0939    PT SHORT TERM GOAL #1   Title Pt to be I in HEP   Time 2   Period Weeks   Status Achieved   PT SHORT TERM GOAL #2   Title Pt ROM to be improved to 8-75 degrees to allow a more normalized gait and easier descent of steps    Time 2   Period Weeks   Status On-going   PT SHORT TERM GOAL #3   Title Pain level to be no greater than a 6/10 80% of the day    Time 2   Period Weeks   Status On-going           PT Long Term Goals - 01/19/14 2229    PT LONG TERM GOAL #1   Title Pt to be I in advance HEP   Time 4   Period Weeks   Status On-going   PT LONG TERM GOAL #2   Title Pt pain  to be no greater than a 3/10 80% of the day    Time 4   Period Weeks   Status On-going   PT LONG TERM GOAL #3   Title Pt ROM to be 4 to 90 degrees to allow a normalized gait   Time 4   Period Weeks   Status On-going   PT LONG TERM GOAL #4   Title hip extension increased one grade to allow pt to go up and down steps without difficulty   Time 4   Period Weeks   Status On-going               Plan - 01/19/14 0935    Clinical Impression Statement Pt with hard endfeel for flexion.  No palpable adhesions felt with manual.  Order received for JAS brace; Lt LE measured and faxed to [place] representative.    Pt requires therapist faciliaton with all exercises.   PT Next Visit Plan continue to progress ROM and await arrival of JAS.  Begin reciprocal 4" step next treatment         Problem List Patient Active Problem List   Diagnosis Date Noted  . A-fib 02/29/2012  . Muscle weakness (generalized) 02/21/2012  . Lack of coordination 02/21/2012  . CVA (cerebral infarction) 12/28/2011  . Respiratory distress 12/25/2011  . Aspiration into respiratory tract 12/24/2011  . Embolic stroke involving  middle cerebral artery 12/21/2011  . Focal motor seizure 12/16/2011  . Pain, knee 10/18/2010  . S/P total knee replacement 10/18/2010  . Diabetes mellitus   . OSTEOARTHRITIS, KNEE, LEFT 07/12/2009  . TOTAL KNEE FOLLOW-UP 08/13/2007  . JOINT EFFUSION, KNEE 06/17/2007  . OSTEOARTHRITIS, LOWER LEG 12/05/2006  . Pain in Joint, Lower Leg 12/05/2006  . SCIATICA, RIGHT 12/05/2006  . DIABETES 10/31/2006    Teena Irani, PTA/CLT [phone] 01/19/2014, 9:40 AM  [place] [address] [place], [place], [address] Phone: [phone]   Fax:  [phone]

## 2014-01-21 ENCOUNTER — Ambulatory Visit (HOSPITAL_COMMUNITY)
Admission: RE | Admit: 2014-01-21 | Discharge: 2014-01-21 | Disposition: A | Discharge status: Home / Self Care | Payer: BC Managed Care – PPO | Source: Ambulatory Visit | Attending: Family Medicine | Admitting: Family Medicine

## 2014-01-21 ENCOUNTER — Encounter (HOSPITAL_COMMUNITY): Payer: Self-pay | Admitting: Physical Therapy

## 2014-01-21 DIAGNOSIS — M25562 Pain in left knee: Secondary | ICD-10-CM | POA: Diagnosis not present

## 2014-01-21 DIAGNOSIS — Z5189 Encounter for other specified aftercare: Secondary | ICD-10-CM | POA: Diagnosis not present

## 2014-01-21 DIAGNOSIS — M25662 Stiffness of left knee, not elsewhere classified: Secondary | ICD-10-CM | POA: Diagnosis not present

## 2014-01-21 DIAGNOSIS — R262 Difficulty in walking, not elsewhere classified: Secondary | ICD-10-CM

## 2014-01-21 DIAGNOSIS — R278 Other lack of coordination: Secondary | ICD-10-CM | POA: Diagnosis not present

## 2014-01-21 NOTE — Therapy (Signed)
Brendan Scott [place], [place], [address] Phone: [phone]   Fax:  [phone]  Physical Therapy Treatment  Patient Details  Name: Brendan Scott MRN: [id number] Date of Birth: December 06, 1945  Encounter Date: 01/21/2014      PT End of Session - 01/21/14 0905    Visit Number 7   Number of Visits 12   Date for PT Re-Evaluation 01/29/14   Authorization Type medicare   Authorization - Visit Number 7   Authorization - Number of Visits 10   PT Start Time 0848   PT Stop Time 0930   PT Time Calculation (min) 42 min   Activity Tolerance Patient tolerated treatment well   Behavior During Therapy [facility] for tasks assessed/performed      Past Medical History  Diagnosis Date  . Arthritis   . Diabetes mellitus   . Atrial fibrillation   . HTN (hypertension)   . Stroke   . Seizure disorder     Past Surgical History  Procedure Laterality Date  . Total knee arthroplasty      Bilateral    There were no vitals taken for this visit.  Visit Diagnosis:  Knee pain, acute, left  Stiffness of left knee  Difficulty walking down stairs      Subjective Assessment - 01/21/14 0857    Symptoms Pt c/o soreness, pain sca;e 3/10   Currently in Pain? Yes   Pain Score 3    Pain Location Knee   Pain Orientation Left;Medial   Pain Descriptors / Indicators Aching;Sore          [facility] PT Assessment - 01/21/14 0001    Assessment   Medical Diagnosis Lt knee pain   Onset Date 01/14/14   Next MD Visit unscheduled   Prior Therapy none   Precautions   Precautions None            [facility] Adult PT Treatment/Exercise - 01/21/14 0937    Exercises   Exercises Knee/Hip   Knee/Hip Exercises: Stretches   Active Hamstring Stretch 3 reps;30 seconds   Active Hamstring Stretch Limitations standing 14" step; muscle energy technique supine 5 reps   Quad Stretch 3 reps;30 seconds   Knee: Self-Stretch to increase Flexion 10 seconds   Knee:  Self-Stretch Limitations 10 reps stretch onto 12" box   Gastroc Stretch 3 reps;30 seconds;Limitations   Gastroc Stretch Limitations onto slant board   Knee/Hip Exercises: Aerobic   Stationary Bike x 10'   Knee/Hip Exercises: Standing   Knee Flexion 10 reps   Knee Flexion Limitations off 2" box to increase flexion    Forward Lunges Left;10 reps   Forward Lunges Limitations on second step   Wall Squat 10 reps   Knee/Hip Exercises: Supine   Knee Extension PROM   Knee/Hip Exercises: Prone   Contract/Relax to Increase Flexion x10   Manual Therapy   Manual Therapy Myofascial release;Joint mobilization             PT Short Term Goals - 01/21/14 7341    PT SHORT TERM GOAL #1   Title Pt to be I in HEP   PT SHORT TERM GOAL #2   Title Pt ROM to be improved to 8-75 degrees to allow a more normalized gait and easier descent of steps    Status On-going   PT SHORT TERM GOAL #3   Title Pain level to be no greater than a 6/10 80% of the day    Status On-going  PT Long Term Goals - 01/21/14 0908    PT LONG TERM GOAL #1   Title Pt to be I in advance HEP   Status On-going   PT LONG TERM GOAL #2   Title Pt pain to be no greater than a 3/10 80% of the day    Status On-going   PT LONG TERM GOAL #3   Title Pt ROM to be 4 to 90 degrees to allow a normalized gait   Status On-going   PT LONG TERM GOAL #4   Title hip extension increased one grade to allow pt to go up and down steps without difficulty   Status On-going            Plan - 01/21/14 0907    Clinical Impression Statement Session focus on improving AROM with stretches for muscle lengthening, ROM strengthening exercises and manual techniques to improve muscle lengthening and reduce fascial restrictions to improve AROM.  PT required therapist facilitation with all exercises for proper form and technique.  Discussion held about JAS, pt reported he has not yet received a call about brace.   PT Next Visit Plan continue  to progress ROM and await arrival of JAS.  Begin reciprocal 4" step next treatment         Problem List Patient Active Problem List   Diagnosis Date Noted  . A-fib 02/29/2012  . Muscle weakness (generalized) 02/21/2012  . Lack of coordination 02/21/2012  . CVA (cerebral infarction) 12/28/2011  . Respiratory distress 12/25/2011  . Aspiration into respiratory tract 12/24/2011  . Embolic stroke involving middle cerebral artery 12/21/2011  . Focal motor seizure 12/16/2011  . Pain, knee 10/18/2010  . S/P total knee replacement 10/18/2010  . Diabetes mellitus   . OSTEOARTHRITIS, KNEE, LEFT 07/12/2009  . TOTAL KNEE FOLLOW-UP 08/13/2007  . JOINT EFFUSION, KNEE 06/17/2007  . OSTEOARTHRITIS, LOWER LEG 12/05/2006  . Pain in Joint, Lower Leg 12/05/2006  . SCIATICA, RIGHT 12/05/2006  . DIABETES 10/31/2006   Brendan Scott, [place]  Brendan Scott 01/21/2014, 10:05 AM  [place], [place], [address] Phone: [phone]   Fax:  [phone]

## 2014-01-26 ENCOUNTER — Ambulatory Visit (HOSPITAL_COMMUNITY)
Admission: RE | Admit: 2014-01-26 | Discharge: 2014-01-26 | Disposition: A | Discharge status: Home / Self Care | Payer: BC Managed Care – PPO | Source: Ambulatory Visit | Attending: Family Medicine | Admitting: Family Medicine

## 2014-01-26 DIAGNOSIS — E663 Overweight: Secondary | ICD-10-CM | POA: Diagnosis not present

## 2014-01-26 DIAGNOSIS — M25662 Stiffness of left knee, not elsewhere classified: Secondary | ICD-10-CM

## 2014-01-26 DIAGNOSIS — Z5189 Encounter for other specified aftercare: Secondary | ICD-10-CM | POA: Diagnosis not present

## 2014-01-26 DIAGNOSIS — M25562 Pain in left knee: Secondary | ICD-10-CM | POA: Diagnosis not present

## 2014-01-26 DIAGNOSIS — R278 Other lack of coordination: Secondary | ICD-10-CM | POA: Diagnosis not present

## 2014-01-26 DIAGNOSIS — J069 Acute upper respiratory infection, unspecified: Secondary | ICD-10-CM | POA: Diagnosis not present

## 2014-01-26 DIAGNOSIS — Z6829 Body mass index (BMI) 29.0-29.9, adult: Secondary | ICD-10-CM | POA: Diagnosis not present

## 2014-01-26 DIAGNOSIS — R262 Difficulty in walking, not elsewhere classified: Secondary | ICD-10-CM

## 2014-01-26 DIAGNOSIS — J209 Acute bronchitis, unspecified: Secondary | ICD-10-CM | POA: Diagnosis not present

## 2014-01-26 NOTE — Therapy (Signed)
[place], [place], [address] Phone: [phone]   Fax:  [phone]  Physical Therapy Treatment  Patient Details  Name: Brendan Scott MRN: [id number] Date of Birth: 02/08/1945  Encounter Date: 01/26/2014      PT End of Session - 01/26/14 1214    Visit Number 8   Number of Visits 12   Date for PT Re-Evaluation 01/29/14   Authorization Type medicare   Authorization - Visit Number 8   Authorization - Number of Visits 10   PT Start Time 0932   PT Stop Time 1019   PT Time Calculation (min) 47 min      Past Medical History  Diagnosis Date  . Arthritis   . Diabetes mellitus   . Atrial fibrillation   . HTN (hypertension)   . Stroke   . Seizure disorder     Past Surgical History  Procedure Laterality Date  . Total knee arthroplasty      Bilateral    There were no vitals taken for this visit.  Visit Diagnosis:  Knee pain, acute, left  Stiffness of left knee  Difficulty walking down stairs      Subjective Assessment - 01/26/14 1016    Symptoms Pt states he continues to have pain on the inside of his knee    Pain Score 5    Pain Location Knee   Pain Orientation Left;Medial                    [facility] Adult PT Treatment/Exercise - 01/26/14 0001    Exercises   Exercises Knee/Hip   Knee/Hip Exercises: Stretches   Active Hamstring Stretch --   Active Hamstring Stretch Limitations --   Quad Stretch 3 reps;30 seconds   Knee: Self-Stretch to increase Flexion 3 x 30 (lunging on second step    Knee: Self-Stretch Limitations --   Gastroc Stretch --   Gastroc Stretch Limitations --   Knee/Hip Exercises: Aerobic   Stationary Bike x 10'  rocking    Knee/Hip Exercises: Standing   Knee Flexion 10 reps   Knee Flexion Limitations off 2" box to increase flexion    Forward Lunges Left;10 reps   Forward Lunges Limitations on second step   Wall Squat 10 reps   Knee/Hip Exercises: Supine   Quad  Sets 15 reps   Heel Slides 15 reps   Knee Extension PROM   Other Supine Knee Exercises wall leg slides 30sec hold pull down further repeat x 5    Other Supine Knee Exercises stool scoot x 2 RT   Knee/Hip Exercises: Prone   Hamstring Curl 15 reps   Hip Extension 15 reps   Contract/Relax to Increase Flexion x10   Manual Therapy   Manual Therapy Joint mobilization   Joint Mobilization --  Lt knee to promote flexion                   PT Short Term Goals - 01/21/14 0907    PT SHORT TERM GOAL #1   Title Pt to be I in HEP   PT SHORT TERM GOAL #2   Title Pt ROM to be improved to 8-75 degrees to allow a more normalized gait and easier descent of steps    Status On-going   PT SHORT TERM GOAL #3   Title Pain level to be no greater than a 6/10 80% of the day    Status On-going  PT Long Term Goals - 01/21/14 0908    PT LONG TERM GOAL #1   Title Pt to be I in advance HEP   Status On-going   PT LONG TERM GOAL #2   Title Pt pain to be no greater than a 3/10 80% of the day    Status On-going   PT LONG TERM GOAL #3   Title Pt ROM to be 4 to 90 degrees to allow a normalized gait   Status On-going   PT LONG TERM GOAL #4   Title hip extension increased one grade to allow pt to go up and down steps without difficulty   Status On-going               Plan - 01/26/14 1215    Clinical Impression Statement [name] to flexion wtih limited improvement.  [name] for brace.  Pt able to handle grade III mobs to try and increase flexion.  Session continues to focus on increasing pt knee flextion to improve his functional ability.     PT Next Visit Plan beging reciprocal 4" step as well as functional lifting off 4" step         Problem List Patient Active Problem List   Diagnosis Date Noted  . A-fib 02/29/2012  . Muscle weakness (generalized) 02/21/2012  . Lack of coordination 02/21/2012  . CVA (cerebral infarction) 12/28/2011  .  Respiratory distress 12/25/2011  . Aspiration into respiratory tract 12/24/2011  . Embolic stroke involving middle cerebral artery 12/21/2011  . Focal motor seizure 12/16/2011  . Pain, knee 10/18/2010  . S/P total knee replacement 10/18/2010  . Diabetes mellitus   . OSTEOARTHRITIS, KNEE, LEFT 07/12/2009  . TOTAL KNEE FOLLOW-UP 08/13/2007  . JOINT EFFUSION, KNEE 06/17/2007  . OSTEOARTHRITIS, LOWER LEG 12/05/2006  . Pain in Joint, Lower Leg 12/05/2006  . SCIATICA, RIGHT 12/05/2006  . DIABETES 10/31/2006    RUSSELL,[name] PT  01/26/2014, 12:18 PM  [place] [address] [place], [place], [address] Phone: [phone]   Fax:  [phone]

## 2014-01-28 ENCOUNTER — Ambulatory Visit (HOSPITAL_COMMUNITY): Payer: BC Managed Care – PPO | Admitting: Physical Therapy

## 2014-01-29 ENCOUNTER — Ambulatory Visit (HOSPITAL_COMMUNITY): Payer: BC Managed Care – PPO | Admitting: Physical Therapy

## 2014-02-02 DIAGNOSIS — M1712 Unilateral primary osteoarthritis, left knee: Secondary | ICD-10-CM | POA: Diagnosis not present

## 2014-02-02 DIAGNOSIS — M25562 Pain in left knee: Secondary | ICD-10-CM | POA: Diagnosis not present

## 2014-02-04 ENCOUNTER — Ambulatory Visit (HOSPITAL_COMMUNITY)
Admission: RE | Admit: 2014-02-04 | Discharge: 2014-02-04 | Disposition: A | Discharge status: Home / Self Care | Payer: BC Managed Care – PPO | Source: Ambulatory Visit | Attending: Family Medicine | Admitting: Family Medicine

## 2014-02-04 DIAGNOSIS — Z5189 Encounter for other specified aftercare: Secondary | ICD-10-CM | POA: Insufficient documentation

## 2014-02-04 DIAGNOSIS — R278 Other lack of coordination: Secondary | ICD-10-CM | POA: Diagnosis not present

## 2014-02-04 DIAGNOSIS — M25562 Pain in left knee: Secondary | ICD-10-CM | POA: Insufficient documentation

## 2014-02-04 DIAGNOSIS — R262 Difficulty in walking, not elsewhere classified: Secondary | ICD-10-CM

## 2014-02-04 DIAGNOSIS — M25662 Stiffness of left knee, not elsewhere classified: Secondary | ICD-10-CM | POA: Diagnosis not present

## 2014-02-04 NOTE — Therapy (Signed)
[place], [place], [address] Phone: [phone]   Fax:  [phone]  Physical Therapy Treatment  Patient Details  Name: Brendan Scott MRN: [id number] Date of Birth: 02-06-1945  Encounter Date: 02/04/2014      PT End of Session - 02/04/14 1229    Visit Number 9   Number of Visits 12   Authorization Type medicare   Authorization - Visit Number 9   Authorization - Number of Visits 10   PT Start Time 1020   PT Stop Time 1109   PT Time Calculation (min) 49 min   Activity Tolerance Patient tolerated treatment well      Past Medical History  Diagnosis Date  . Arthritis   . Diabetes mellitus   . Atrial fibrillation   . HTN (hypertension)   . Stroke   . Seizure disorder     Past Surgical History  Procedure Laterality Date  . Total knee arthroplasty      Bilateral    There were no vitals taken for this visit.  Visit Diagnosis:  Knee pain, acute, left  Stiffness of left knee  Difficulty walking down stairs      Subjective Assessment - 02/04/14 1223    Symptoms Pt comes to department with his JAS brace stating due to his stroke he is unable to put the brace on.     Currently in Pain? Yes   Pain Score 4    Pain Location Knee   Pain Orientation Left   Pain Descriptors / Indicators Aching                    [facility] Adult PT Treatment/Exercise - 02/04/14 0001    Knee/Hip Exercises: Stretches   Quad Stretch 3 reps;30 seconds   Knee/Hip Exercises: Standing   Forward Lunges Left;10 reps   Forward Lunges Limitations on second step   Side Lunges Left;10 reps   Side Lunges Limitations 2nd step    Stairs 2 Rt concentrating on not circumducting LE but rather bringing it up straight.    Knee/Hip Exercises: Prone   Contract/Relax to Increase Flexion x5   ORthotic    Education Provided Proper wear schedule/adjustment;Other (comment)   Person(s) Educated Patient   Education Method  Explanation;Demonstration   Donning orthesis Supervision   Doffing orthesis Supervision                  PT Short Term Goals - 01/21/14 0907    PT SHORT TERM GOAL #1   Title Pt to be I in HEP   PT SHORT TERM GOAL #2   Title Pt ROM to be improved to 8-75 degrees to allow a more normalized gait and easier descent of steps    Status On-going   PT SHORT TERM GOAL #3   Title Pain level to be no greater than a 6/10 80% of the day    Status On-going           PT Long Term Goals - 01/21/14 0908    PT LONG TERM GOAL #1   Title Pt to be I in advance HEP   Status On-going   PT LONG TERM GOAL #2   Title Pt pain to be no greater than a 3/10 80% of the day    Status On-going   PT LONG TERM GOAL #3   Title Pt ROM to be 4 to 90 degrees to allow a normalized gait   Status  On-going   PT LONG TERM GOAL #4   Title hip extension increased one grade to allow pt to go up and down steps without difficulty   Status On-going               Plan - 02/04/14 1230    Clinical Impression Statement Pt and therapist Donned and Doffed brace together x 5 reps with pt needing only occasional verbal cuing at the end.  Therapist stressed to pt that althought it feels difficult now the more he puts the brace on the easier it will get for him. Therapist stressed the importance of using the brace on 3x a day for 30 minutes.    PT Next Visit Plan G code due next treatment.  Pt will be seen three more visits and then discharge to HEP         Problem List Patient Active Problem List   Diagnosis Date Noted  . A-fib 02/29/2012  . Muscle weakness (generalized) 02/21/2012  . Lack of coordination 02/21/2012  . CVA (cerebral infarction) 12/28/2011  . Respiratory distress 12/25/2011  . Aspiration into respiratory tract 12/24/2011  . Embolic stroke involving middle cerebral artery 12/21/2011  . Focal motor seizure 12/16/2011  . Pain, knee 10/18/2010  . S/P total knee replacement 10/18/2010  .  Diabetes mellitus   . OSTEOARTHRITIS, KNEE, LEFT 07/12/2009  . TOTAL KNEE FOLLOW-UP 08/13/2007  . JOINT EFFUSION, KNEE 06/17/2007  . OSTEOARTHRITIS, LOWER LEG 12/05/2006  . Pain in Joint, Lower Leg 12/05/2006  . SCIATICA, RIGHT 12/05/2006  . DIABETES 10/31/2006    Kyliah Deanda,[name] PT/CLT 02/04/2014, 12:34 PM  [place], [place], [address] Phone: [phone]   Fax:  [phone]

## 2014-02-06 ENCOUNTER — Ambulatory Visit (HOSPITAL_COMMUNITY)
Admission: RE | Admit: 2014-02-06 | Discharge: 2014-02-06 | Disposition: A | Discharge status: Home / Self Care | Payer: BC Managed Care – PPO | Source: Ambulatory Visit | Attending: Family Medicine | Admitting: Family Medicine

## 2014-02-09 ENCOUNTER — Ambulatory Visit (HOSPITAL_COMMUNITY): Payer: BC Managed Care – PPO | Admitting: Physical Therapy

## 2014-02-09 NOTE — Therapy (Signed)
[place], [place], [address] Phone: [phone]   Fax:  [phone]  Patient Details  Name: Brendan Scott MRN: [id number] Date of Birth: Dec 13, 1945 Referring Provider:  Elsie Lincoln, MD  Encounter Date: 02/06/2014   [name],[name] 02/09/2014, 11:13 AM  PHYSICAL THERAPY DISCHARGE SUMMARY  Visits from Start of Care: 9  Current functional level related to goals / functional outcomes: Pt has made STG of I HEP; pain no greater than a 6/10 and ROM to 75 degrees but knee will not progress pass this.  No LTG have been met.  Pt MD states manipulation needs to be completed and to stop therapy.  Pt has a JAS brace   Remaining deficits: ROM is still not beyond 75 degree of flexion but pt continues to work.    Education / Equipment: JAS  Plan: Patient agrees to discharge.  Patient goals were not met. Patient is being discharged due to the physician's request.  ?????     [place], [place], [address] Phone: [phone]   Fax:  [phone]

## 2014-02-10 DIAGNOSIS — E1140 Type 2 diabetes mellitus with diabetic neuropathy, unspecified: Secondary | ICD-10-CM | POA: Diagnosis not present

## 2014-02-10 DIAGNOSIS — E1142 Type 2 diabetes mellitus with diabetic polyneuropathy: Secondary | ICD-10-CM | POA: Diagnosis not present

## 2014-02-11 ENCOUNTER — Ambulatory Visit (HOSPITAL_COMMUNITY): Payer: BC Managed Care – PPO | Admitting: Physical Therapy

## 2014-03-01 DIAGNOSIS — R079 Chest pain, unspecified: Secondary | ICD-10-CM | POA: Diagnosis not present

## 2014-03-01 DIAGNOSIS — E780 Pure hypercholesterolemia: Secondary | ICD-10-CM | POA: Diagnosis not present

## 2014-03-01 DIAGNOSIS — E119 Type 2 diabetes mellitus without complications: Secondary | ICD-10-CM | POA: Diagnosis not present

## 2014-03-01 DIAGNOSIS — Z7901 Long term (current) use of anticoagulants: Secondary | ICD-10-CM | POA: Diagnosis not present

## 2014-03-01 DIAGNOSIS — I4891 Unspecified atrial fibrillation: Secondary | ICD-10-CM | POA: Diagnosis not present

## 2014-03-01 DIAGNOSIS — I4430 Unspecified atrioventricular block: Secondary | ICD-10-CM | POA: Diagnosis not present

## 2014-03-01 DIAGNOSIS — Z8673 Personal history of transient ischemic attack (TIA), and cerebral infarction without residual deficits: Secondary | ICD-10-CM | POA: Diagnosis not present

## 2014-03-01 DIAGNOSIS — I10 Essential (primary) hypertension: Secondary | ICD-10-CM | POA: Diagnosis not present

## 2014-03-01 DIAGNOSIS — Z87891 Personal history of nicotine dependence: Secondary | ICD-10-CM | POA: Diagnosis not present

## 2014-03-12 DIAGNOSIS — E119 Type 2 diabetes mellitus without complications: Secondary | ICD-10-CM | POA: Diagnosis not present

## 2014-03-12 DIAGNOSIS — Z6828 Body mass index (BMI) 28.0-28.9, adult: Secondary | ICD-10-CM | POA: Diagnosis not present

## 2014-03-12 DIAGNOSIS — E782 Mixed hyperlipidemia: Secondary | ICD-10-CM | POA: Diagnosis not present

## 2014-03-12 DIAGNOSIS — I10 Essential (primary) hypertension: Secondary | ICD-10-CM | POA: Diagnosis not present

## 2014-04-14 ENCOUNTER — Ambulatory Visit (INDEPENDENT_AMBULATORY_CARE_PROVIDER_SITE_OTHER): Payer: BC Managed Care – PPO | Admitting: Cardiovascular Disease

## 2014-04-14 ENCOUNTER — Encounter: Payer: Self-pay | Admitting: Cardiovascular Disease

## 2014-04-14 VITALS — BP 158/88 | HR 57 | Ht 66.0 in | Wt 185.6 lb

## 2014-04-14 DIAGNOSIS — R001 Bradycardia, unspecified: Secondary | ICD-10-CM

## 2014-04-14 DIAGNOSIS — E119 Type 2 diabetes mellitus without complications: Secondary | ICD-10-CM | POA: Diagnosis not present

## 2014-04-14 DIAGNOSIS — I10 Essential (primary) hypertension: Secondary | ICD-10-CM

## 2014-04-14 DIAGNOSIS — E785 Hyperlipidemia, unspecified: Secondary | ICD-10-CM | POA: Diagnosis not present

## 2014-04-14 DIAGNOSIS — Z9289 Personal history of other medical treatment: Secondary | ICD-10-CM

## 2014-04-14 DIAGNOSIS — I482 Chronic atrial fibrillation, unspecified: Secondary | ICD-10-CM

## 2014-04-14 DIAGNOSIS — Z87898 Personal history of other specified conditions: Secondary | ICD-10-CM

## 2014-04-14 NOTE — Progress Notes (Signed)
Patient ID: Brendan Scott, male   DOB: 1945/02/02, 69 y.o.   MRN: [id number]      SUBJECTIVE: Brendan Scott has a history of chronic atrial fibrillation and is on Xarelto, a stroke in 12/2011, essential HTN, diabetes, and hyperlipidemia. Labs from 03/12/14 show TC 181, HDL 70, LDL 102, TG 47. His home BP log demonstrates well controlled blood pressure and heart rate overall. He presented to [facility] with atypical chest pain. He was noted to be bradycardic in the setting of atrial fibrillation and his carvedilol dose had recently been increased. He ruled out for myocardial infarction. I personally reviewed all documentation labs and studies from this hospitalization as well as the discharge summary dated 03/03/14. Carvedilol was decreased and his heart rate improved to the 60 beat per minute range.  An echocardiogram was reportedly performed which demonstrated mild concentric left ventricular hypertrophy with normal left ventricular systolic function (EF 67-89%) and mild mitral regurgitation, mild RV dilatation, mildly reduced RV systolic function, mild LAE, mild to moderate RAE, of note, hemoglobin A1c was 6.7%. with mildly elevated pulmonary pressures of 45 mmHg. His ECG at the time of presentation reportedly demonstrated a junctional rhythm with a heart rate in the 40 beat per minute range. He also had hypertensive urgency at an outside hospital prior to his presentation at [facility] with a BP of 200/90.  The patient denies any symptoms of chest pain, palpitations, shortness of breath, lightheadedness, dizziness, leg swelling, orthopnea, PND, and syncope. His wife says he has been doing well. He has been off of seizure medications for the past 3 months.  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   Review of Systems: As per "subjective", otherwise negative.  No Known Allergies  Current Outpatient Prescriptions  Medication Sig Dispense Refill  .  carvedilol (COREG) 3.125 MG tablet Take 1 tablet (3.125 mg total) by mouth 2 (two) times daily. 60 tablet 11  . glimepiride (AMARYL) 2 MG tablet Take 2 mg by mouth daily before breakfast.     . lisinopril (PRINIVIL,ZESTRIL) 5 MG tablet Take 1 tablet (5 mg total) by mouth daily. 30 tablet 1  . Rivaroxaban (XARELTO) 20 MG TABS tablet Take 20 mg by mouth daily.    . simvastatin (ZOCOR) 20 MG tablet Take 1 tablet (20 mg total) by mouth daily at 6 PM. 30 tablet 1  . traMADol (ULTRAM) 50 MG tablet Take 1 tablet (50 mg total) by mouth every 6 (six) hours as needed. 60 tablet 5   No current facility-administered medications for this visit.    Past Medical History  Diagnosis Date  . Arthritis   . Diabetes mellitus   . Atrial fibrillation   . HTN (hypertension)   . Stroke   . Seizure disorder     Past Surgical History  Procedure Laterality Date  . Total knee arthroplasty      Bilateral    History   Social History  . Marital Status: Married    Spouse Name: N/A  . Number of Children: N/A  . Years of Education: N/A   Occupational History  . Not on file.   Social History Main Topics  . Smoking status: Former Research scientist (life sciences)  . Smokeless tobacco: Not on file     Comment: quit 30 years ago  . Alcohol Use: No  . Drug Use: No  . Sexual Activity: Not on file   Other Topics Concern  . Not on file   Social  History Narrative    BP 158/88  Pulse 52-57  SpO2 99%  Weight 185 lb 9.6 oz (84.188 kg) Height 5\' 6"  (1.676 m)    PHYSICAL EXAM General: NAD HEENT: Normal. Neck: No JVD, no thyromegaly. Lungs: Clear to auscultation bilaterally with normal respiratory effort. CV: Nondisplaced PMI.  Bradycardic, regular rhythm, normal S1/S2, no S3/S4, no murmur. No pretibial or periankle edema.   Abdomen: Soft, nontender, no distention.  Neurologic: Alert and oriented x 3.  Psych: Normal affect. Skin: Normal. Musculoskeletal: Normal range of motion, no gross deformities. Extremities: No clubbing  or cyanosis.   ECG: Most recent ECG reviewed.      ASSESSMENT AND PLAN: 1. Atrial fibrillation: Bradycardic but asymptomatic and off of AV nodal blocking agents altogether, indicative of conduction system disease. Maintained on Xarelto for anticoagulation, as he had problems regulating his INR on warfarin. Will continue to monitor HR. He is currently not in need of a pacemaker. 2. Essential HTN: Uncontrolled today, but his personal log shows consistently normal readings. As he is off of Coreg, BP will need continued monitoring at home. For now, continue lisinopril 5 mg daily. 3. Hyperlipidemia: Labs noted above. Continue simvastatin 20 mg daily. 4. Type 2 diabetes: HbA1C 6.7% demonstrating reasonable control. Continue present management.  Dispo: f/u 4 months.  Time spent: 40 minutes, of which greater than 50% was spent reviewing symptoms, relevant blood tests and studies, and discussing management plan with the patient.   Kate Sable, M.D., F.A.C.C.

## 2014-04-14 NOTE — Patient Instructions (Signed)
Your physician wants you to follow-up in: 4 months You will receive a reminder letter in the mail two months in advance. If you don't receive a letter, please call our office to schedule the follow-up appointment.   Your physician recommends that you continue on your current medications as directed. Please refer to the Current Medication list given to you today.      Thank you for choosing [facility] !         

## 2014-04-21 DIAGNOSIS — E1140 Type 2 diabetes mellitus with diabetic neuropathy, unspecified: Secondary | ICD-10-CM | POA: Diagnosis not present

## 2014-04-21 DIAGNOSIS — E1142 Type 2 diabetes mellitus with diabetic polyneuropathy: Secondary | ICD-10-CM | POA: Diagnosis not present

## 2014-04-28 ENCOUNTER — Ambulatory Visit: Payer: BC Managed Care – PPO | Admitting: Orthopedic Surgery

## 2014-05-07 ENCOUNTER — Ambulatory Visit: Payer: BC Managed Care – PPO | Admitting: Orthopedic Surgery

## 2014-05-07 ENCOUNTER — Encounter: Payer: Self-pay | Admitting: Orthopedic Surgery

## 2014-05-07 ENCOUNTER — Ambulatory Visit: Payer: BC Managed Care – PPO

## 2014-05-26 DIAGNOSIS — M25562 Pain in left knee: Secondary | ICD-10-CM | POA: Diagnosis not present

## 2014-06-01 DIAGNOSIS — I10 Essential (primary) hypertension: Secondary | ICD-10-CM | POA: Diagnosis not present

## 2014-06-01 DIAGNOSIS — E119 Type 2 diabetes mellitus without complications: Secondary | ICD-10-CM | POA: Diagnosis not present

## 2014-06-01 DIAGNOSIS — E782 Mixed hyperlipidemia: Secondary | ICD-10-CM | POA: Diagnosis not present

## 2014-06-01 DIAGNOSIS — Z6828 Body mass index (BMI) 28.0-28.9, adult: Secondary | ICD-10-CM | POA: Diagnosis not present

## 2014-06-01 DIAGNOSIS — M1712 Unilateral primary osteoarthritis, left knee: Secondary | ICD-10-CM | POA: Diagnosis not present

## 2014-06-22 DIAGNOSIS — E1142 Type 2 diabetes mellitus with diabetic polyneuropathy: Secondary | ICD-10-CM | POA: Diagnosis not present

## 2014-06-22 DIAGNOSIS — E1140 Type 2 diabetes mellitus with diabetic neuropathy, unspecified: Secondary | ICD-10-CM | POA: Diagnosis not present

## 2014-08-27 DIAGNOSIS — Z1389 Encounter for screening for other disorder: Secondary | ICD-10-CM | POA: Diagnosis not present

## 2014-08-27 DIAGNOSIS — E782 Mixed hyperlipidemia: Secondary | ICD-10-CM | POA: Diagnosis not present

## 2014-08-27 DIAGNOSIS — Z6829 Body mass index (BMI) 29.0-29.9, adult: Secondary | ICD-10-CM | POA: Diagnosis not present

## 2014-08-27 DIAGNOSIS — E119 Type 2 diabetes mellitus without complications: Secondary | ICD-10-CM | POA: Diagnosis not present

## 2014-08-27 DIAGNOSIS — I10 Essential (primary) hypertension: Secondary | ICD-10-CM | POA: Diagnosis not present

## 2014-08-27 DIAGNOSIS — G894 Chronic pain syndrome: Secondary | ICD-10-CM | POA: Diagnosis not present

## 2014-09-02 DIAGNOSIS — Z1211 Encounter for screening for malignant neoplasm of colon: Secondary | ICD-10-CM | POA: Diagnosis not present

## 2014-09-08 DIAGNOSIS — E1142 Type 2 diabetes mellitus with diabetic polyneuropathy: Secondary | ICD-10-CM | POA: Diagnosis not present

## 2014-09-08 DIAGNOSIS — E1140 Type 2 diabetes mellitus with diabetic neuropathy, unspecified: Secondary | ICD-10-CM | POA: Diagnosis not present

## 2014-09-15 ENCOUNTER — Ambulatory Visit (INDEPENDENT_AMBULATORY_CARE_PROVIDER_SITE_OTHER): Payer: BC Managed Care – PPO | Admitting: Cardiovascular Disease

## 2014-09-15 ENCOUNTER — Encounter: Payer: Self-pay | Admitting: Cardiovascular Disease

## 2014-09-15 VITALS — BP 116/72 | HR 91 | Ht 66.0 in | Wt 191.8 lb

## 2014-09-15 DIAGNOSIS — I4891 Unspecified atrial fibrillation: Secondary | ICD-10-CM | POA: Diagnosis not present

## 2014-09-15 DIAGNOSIS — N289 Disorder of kidney and ureter, unspecified: Secondary | ICD-10-CM

## 2014-09-15 DIAGNOSIS — I482 Chronic atrial fibrillation, unspecified: Secondary | ICD-10-CM

## 2014-09-15 DIAGNOSIS — I10 Essential (primary) hypertension: Secondary | ICD-10-CM

## 2014-09-15 DIAGNOSIS — E119 Type 2 diabetes mellitus without complications: Secondary | ICD-10-CM

## 2014-09-15 DIAGNOSIS — E785 Hyperlipidemia, unspecified: Secondary | ICD-10-CM

## 2014-09-15 MED ORDER — AMLODIPINE BESYLATE 5 MG PO TABS: 5.0000 mg | ORAL_TABLET | Freq: Every day | ORAL | Status: DC

## 2014-09-15 NOTE — Progress Notes (Signed)
Patient ID: Brendan Scott, male   DOB: 1945/08/05, 69 y.o.   MRN: [id number]      SUBJECTIVE: The patient returns for follow-up of atrial fibrillation, conduction system disease, dyslipidemia, and hypertension. Labs from 03/12/14 show TC 181, HDL 70, LDL 102, TG 47.  Echocardiogram performed earlier this year demonstrated mild concentric left ventricular hypertrophy with normal left ventricular systolic function (EF 46-27%) and mild mitral regurgitation, mild RV dilatation, mildly reduced RV systolic function, mild LAE, mild to moderate RAE, of note, hemoglobin A1c was 6.7%. with mildly elevated pulmonary pressures of 45 mmHg.    Denies palpitations, dizziness, chest pain, shortness of breath, leg swelling, and syncope. BP well controlled at home.  ECG performed in the office today demonstrates atrial fibrillation, HR 67 bpm.  Creatinine 1.42 on 08/27/14 (1.13 on 06/09/14). On triamterene-HCTZ. They are distressed about this.  Lipids on 08/27/14 demonstrated total cholesterol 159, trig glycerides 40, LDL 77, HDL 74. HbA1c 6.5%.   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   Review of Systems: As per "subjective", otherwise negative.  No Known Allergies  Current Outpatient Prescriptions  Medication Sig Dispense Refill  . ALPRAZolam (XANAX) 0.5 MG tablet Take 0.5 mg by mouth at bedtime as needed for anxiety.    Marland Kitchen glimepiride (AMARYL) 2 MG tablet Take 2 mg by mouth daily before breakfast.     . HYDROcodone-acetaminophen (NORCO/VICODIN) 5-325 MG per tablet Take 1 tablet by mouth every 6 (six) hours as needed for moderate pain.    Marland Kitchen lisinopril (PRINIVIL,ZESTRIL) 5 MG tablet Take 1 tablet (5 mg total) by mouth daily. 30 tablet 1  . Rivaroxaban (XARELTO) 20 MG TABS tablet Take 20 mg by mouth daily.    . simvastatin (ZOCOR) 20 MG tablet Take 1 tablet (20 mg total) by mouth daily at 6 PM. 30 tablet 1  . traMADol (ULTRAM) 50 MG tablet Take 1 tablet (50 mg total) by mouth  every 6 (six) hours as needed. 60 tablet 5   No current facility-administered medications for this visit.    Past Medical History  Diagnosis Date  . Arthritis   . Diabetes mellitus   . Atrial fibrillation   . HTN (hypertension)   . Stroke   . Seizure disorder     Past Surgical History  Procedure Laterality Date  . Total knee arthroplasty      Bilateral    Social History   Social History  . Marital Status: Married    Spouse Name: N/A  . Number of Children: N/A  . Years of Education: N/A   Occupational History  . Not on file.   Social History Main Topics  . Smoking status: Former Smoker    Start date: 01/31/1963    Quit date: 01/30/1978  . Smokeless tobacco: Not on file     Comment: quit 30 years ago  . Alcohol Use: No  . Drug Use: No  . Sexual Activity: Not on file   Other Topics Concern  . Not on file   Social History Narrative     Filed Vitals:   09/15/14 0955  BP: 116/72  Pulse: 91  Height: 5\' 6"  (1.676 m)  Weight: 191 lb 12.8 oz (87 kg)  SpO2: 98%    PHYSICAL EXAM General: NAD HEENT: Normal. Neck: No JVD, no thyromegaly. Lungs: Clear to auscultation bilaterally with normal respiratory effort. CV: Regular rate, irregular rhythm, normal S1/S2, no S3/, no murmur. No pretibial or periankle edema.  No carotid  bruit.  Abdomen: Soft, nontender, no hepatosplenomegaly, no distention.  Neurologic: Alert and oriented x 3.  Psych: Normal affect. Skin: Normal. Musculoskeletal: Normal range of motion, no gross deformities. Extremities: No clubbing or cyanosis.   ECG: Most recent ECG reviewed.      ASSESSMENT AND PLAN: 1. Atrial fibrillation: Symptomatically stable. Remains off of AV nodal blocking agents altogether, indicative of conduction system disease. Maintained on Xarelto for anticoagulation, as he had problems regulating his INR on warfarin. Will continue to monitor HR. He is currently not in need of a pacemaker.  2. Essential HTN: Well  controlled but creatinine has increased as noted above. Likely due to diuretic use. Will stop triamterene-HCTZ and start amlodipine 5 mg daily. Will recheck BMET in 1 month.  3. Hyperlipidemia: Controlled as noted above. Continue simvastatin 20 mg daily.  4. Type 2 diabetes: HbA1C 6.5% demonstrating adequate control. Continue present management.  5. Acute renal insufficiency:Ccreatinine has increased as noted above. Likely due to diuretic use. Will stop triamterene-HCTZ and start amlodipine 5 mg daily. Will recheck BMET in 1 month.   Dispo: f/u 6 months.   Kate Sable, M.D., F.A.C.C.

## 2014-09-15 NOTE — Patient Instructions (Addendum)
Your physician wants you to follow-up in: 6 months with Dr Virgina Jock will receive a reminder letter in the mail two months in advance. If you don't receive a letter, please call our office to schedule the follow-up appointment.   STOP Dyazide   START Amlodipine 5 mg daily    Lab work in 1 month BMET   Thank you for choosing [facility] !

## 2014-09-18 ENCOUNTER — Telehealth: Payer: Self-pay | Admitting: Cardiovascular Disease

## 2014-09-18 NOTE — Telephone Encounter (Signed)
Bettie Mcmannis called stating that Brendan Scott does not want to start amlodipine 5 mg daily States that he went to [facility] and had labs. Also, wants to know if he still Needs to have labs in one month.  [phone]   (can leave message)

## 2014-09-18 NOTE — Telephone Encounter (Signed)
PT wife states Brendan Scott will not take the amlopdipine 5 mg-and want's to know since he is not starting on the new medication does he still need a BMET in 1 month?? Please advise

## 2014-09-18 NOTE — Telephone Encounter (Signed)
Advised PT to take the Amlodipine 5 mg - due to the risk of bp rising. He will have lab work done- Voiced understanding

## 2014-09-18 NOTE — Telephone Encounter (Signed)
BMET is to monitor renal function since I stopped diuretic. BP likely to go up, which is why I prescribed amlodipine.

## 2014-09-29 DIAGNOSIS — Z1389 Encounter for screening for other disorder: Secondary | ICD-10-CM | POA: Diagnosis not present

## 2014-09-29 DIAGNOSIS — Z6830 Body mass index (BMI) 30.0-30.9, adult: Secondary | ICD-10-CM | POA: Diagnosis not present

## 2014-09-29 DIAGNOSIS — G894 Chronic pain syndrome: Secondary | ICD-10-CM | POA: Diagnosis not present

## 2014-09-29 DIAGNOSIS — E1129 Type 2 diabetes mellitus with other diabetic kidney complication: Secondary | ICD-10-CM | POA: Diagnosis not present

## 2014-09-29 DIAGNOSIS — E6609 Other obesity due to excess calories: Secondary | ICD-10-CM | POA: Diagnosis not present

## 2014-10-17 LAB — BASIC METABOLIC PANEL
BUN: 24 mg/dL (ref 7–25)
CALCIUM: 9.9 mg/dL (ref 8.6–10.3)
CO2: 24 mmol/L (ref 20–31)
Chloride: 106 mmol/L (ref 98–110)
Creat: 1.55 mg/dL — ABNORMAL HIGH (ref 0.70–1.25)
Glucose, Bld: 71 mg/dL (ref 65–99)
POTASSIUM: 5.3 mmol/L (ref 3.5–5.3)
SODIUM: 139 mmol/L (ref 135–146)

## 2014-10-20 ENCOUNTER — Telehealth: Payer: Self-pay | Admitting: *Deleted

## 2014-10-20 ENCOUNTER — Telehealth: Payer: Self-pay | Admitting: Cardiovascular Disease

## 2014-10-20 NOTE — Telephone Encounter (Signed)
-----   Message from Arnoldo Lenis, MD sent at 10/20/2014  3:16 PM EDT ----- Kidney function slightly worst from previous labs. Is he taking any pain medicines such as aleve, ibuprofen, motrin? He is drinking plenty of water daily?  Zandra Abts MD

## 2014-10-20 NOTE — Telephone Encounter (Signed)
Spoke with Pt and wife. Both notified to lab results and voiced understanding. Wife states that the pt does not take Aleve, Ibuprofen, or Motrin. When asked pt states that he only drinks 3 to 4 glasses of water a day. Pt. encouraged to increase water intake and decrease soda intake. Patient voiced understanding.

## 2014-10-20 NOTE — Telephone Encounter (Signed)
Results of lab work / tg  °

## 2014-10-20 NOTE — Telephone Encounter (Signed)
Spoke with wife. Stated that the results have not been released at this time. Wife voiced understanding.

## 2014-10-30 DIAGNOSIS — N183 Chronic kidney disease, stage 3 (moderate): Secondary | ICD-10-CM | POA: Diagnosis not present

## 2014-10-30 DIAGNOSIS — Z6830 Body mass index (BMI) 30.0-30.9, adult: Secondary | ICD-10-CM | POA: Diagnosis not present

## 2014-10-30 DIAGNOSIS — G894 Chronic pain syndrome: Secondary | ICD-10-CM | POA: Diagnosis not present

## 2014-11-17 DIAGNOSIS — E1140 Type 2 diabetes mellitus with diabetic neuropathy, unspecified: Secondary | ICD-10-CM | POA: Diagnosis not present

## 2014-11-17 DIAGNOSIS — E1142 Type 2 diabetes mellitus with diabetic polyneuropathy: Secondary | ICD-10-CM | POA: Diagnosis not present

## 2014-11-30 DIAGNOSIS — E6609 Other obesity due to excess calories: Secondary | ICD-10-CM | POA: Diagnosis not present

## 2014-11-30 DIAGNOSIS — Z6831 Body mass index (BMI) 31.0-31.9, adult: Secondary | ICD-10-CM | POA: Diagnosis not present

## 2014-11-30 DIAGNOSIS — Z1389 Encounter for screening for other disorder: Secondary | ICD-10-CM | POA: Diagnosis not present

## 2014-11-30 DIAGNOSIS — G894 Chronic pain syndrome: Secondary | ICD-10-CM | POA: Diagnosis not present

## 2015-01-08 DIAGNOSIS — E6609 Other obesity due to excess calories: Secondary | ICD-10-CM | POA: Diagnosis not present

## 2015-01-08 DIAGNOSIS — G894 Chronic pain syndrome: Secondary | ICD-10-CM | POA: Diagnosis not present

## 2015-01-08 DIAGNOSIS — Z6831 Body mass index (BMI) 31.0-31.9, adult: Secondary | ICD-10-CM | POA: Diagnosis not present

## 2015-02-01 DIAGNOSIS — E1140 Type 2 diabetes mellitus with diabetic neuropathy, unspecified: Secondary | ICD-10-CM | POA: Diagnosis not present

## 2015-02-01 DIAGNOSIS — E1142 Type 2 diabetes mellitus with diabetic polyneuropathy: Secondary | ICD-10-CM | POA: Diagnosis not present

## 2015-02-08 DIAGNOSIS — G894 Chronic pain syndrome: Secondary | ICD-10-CM | POA: Diagnosis not present

## 2015-02-08 DIAGNOSIS — E6609 Other obesity due to excess calories: Secondary | ICD-10-CM | POA: Diagnosis not present

## 2015-02-08 DIAGNOSIS — Z6831 Body mass index (BMI) 31.0-31.9, adult: Secondary | ICD-10-CM | POA: Diagnosis not present

## 2015-02-08 DIAGNOSIS — Z1389 Encounter for screening for other disorder: Secondary | ICD-10-CM | POA: Diagnosis not present

## 2015-02-15 DIAGNOSIS — I6990 Unspecified sequelae of unspecified cerebrovascular disease: Secondary | ICD-10-CM | POA: Diagnosis not present

## 2015-02-15 DIAGNOSIS — D320 Benign neoplasm of cerebral meninges: Secondary | ICD-10-CM | POA: Diagnosis not present

## 2015-02-15 DIAGNOSIS — R569 Unspecified convulsions: Secondary | ICD-10-CM | POA: Diagnosis not present

## 2015-02-15 DIAGNOSIS — E119 Type 2 diabetes mellitus without complications: Secondary | ICD-10-CM | POA: Diagnosis not present

## 2015-02-15 DIAGNOSIS — R5381 Other malaise: Secondary | ICD-10-CM | POA: Diagnosis not present

## 2015-02-17 ENCOUNTER — Other Ambulatory Visit: Payer: Self-pay | Admitting: Neurology

## 2015-02-17 DIAGNOSIS — D320 Benign neoplasm of cerebral meninges: Secondary | ICD-10-CM

## 2015-02-24 ENCOUNTER — Ambulatory Visit (HOSPITAL_COMMUNITY)
Admission: RE | Admit: 2015-02-24 | Discharge: 2015-02-24 | Disposition: A | Discharge status: Home / Self Care | Payer: BC Managed Care – PPO | Source: Ambulatory Visit | Attending: Neurology | Admitting: Neurology

## 2015-02-24 DIAGNOSIS — D320 Benign neoplasm of cerebral meninges: Secondary | ICD-10-CM | POA: Diagnosis not present

## 2015-02-24 DIAGNOSIS — D329 Benign neoplasm of meninges, unspecified: Secondary | ICD-10-CM | POA: Diagnosis not present

## 2015-03-15 DIAGNOSIS — Z0001 Encounter for general adult medical examination with abnormal findings: Secondary | ICD-10-CM | POA: Diagnosis not present

## 2015-03-15 DIAGNOSIS — Z1389 Encounter for screening for other disorder: Secondary | ICD-10-CM | POA: Diagnosis not present

## 2015-03-15 DIAGNOSIS — E1129 Type 2 diabetes mellitus with other diabetic kidney complication: Secondary | ICD-10-CM | POA: Diagnosis not present

## 2015-03-15 DIAGNOSIS — E6609 Other obesity due to excess calories: Secondary | ICD-10-CM | POA: Diagnosis not present

## 2015-03-15 DIAGNOSIS — Z6831 Body mass index (BMI) 31.0-31.9, adult: Secondary | ICD-10-CM | POA: Diagnosis not present

## 2015-03-19 DIAGNOSIS — Z1211 Encounter for screening for malignant neoplasm of colon: Secondary | ICD-10-CM | POA: Diagnosis not present

## 2015-04-09 ENCOUNTER — Ambulatory Visit (INDEPENDENT_AMBULATORY_CARE_PROVIDER_SITE_OTHER): Payer: BC Managed Care – PPO | Admitting: Cardiovascular Disease

## 2015-04-09 ENCOUNTER — Encounter: Payer: Self-pay | Admitting: Cardiovascular Disease

## 2015-04-09 VITALS — BP 140/90 | HR 52 | Ht 66.0 in | Wt 202.0 lb

## 2015-04-09 DIAGNOSIS — I10 Essential (primary) hypertension: Secondary | ICD-10-CM | POA: Diagnosis not present

## 2015-04-09 DIAGNOSIS — I4891 Unspecified atrial fibrillation: Secondary | ICD-10-CM | POA: Diagnosis not present

## 2015-04-09 DIAGNOSIS — E785 Hyperlipidemia, unspecified: Secondary | ICD-10-CM | POA: Diagnosis not present

## 2015-04-09 DIAGNOSIS — R001 Bradycardia, unspecified: Secondary | ICD-10-CM | POA: Diagnosis not present

## 2015-04-09 NOTE — Progress Notes (Signed)
Patient ID: Brendan Scott, male   DOB: 03/29/1945, 70 y.o.   MRN: [id number]      SUBJECTIVE: The patient returns for follow-up of atrial fibrillation, conduction system disease, dyslipidemia, and hypertension.  Echocardiogram performed in 2016 demonstrated mild concentric left ventricular hypertrophy with normal left ventricular systolic function (EF 123456) and mild mitral regurgitation, mild RV dilatation, mildly reduced RV systolic function, mild LAE, mild to moderate RAE, of note, hemoglobin A1c was 6.7%. with mildly elevated pulmonary pressures of 45 mmHg.  Denies palpitations, dizziness, chest pain, shortness of breath, leg swelling, and syncope.  Lipids on 08/27/14 demonstrated total cholesterol 159, trig glycerides 40, LDL 77, HDL 74. HbA1c 6.5%.  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   Review of Systems: As per "subjective", otherwise negative.  No Known Allergies  Current Outpatient Prescriptions  Medication Sig Dispense Refill  . ALPRAZolam (XANAX) 0.5 MG tablet Take 0.5 mg by mouth at bedtime as needed for anxiety.    Marland Kitchen amLODipine (NORVASC) 5 MG tablet Take 1 tablet (5 mg total) by mouth daily. 90 tablet 3  . glimepiride (AMARYL) 2 MG tablet Take 2 mg by mouth daily before breakfast.     . HYDROcodone-acetaminophen (NORCO/VICODIN) 5-325 MG per tablet Take 1 tablet by mouth every 6 (six) hours as needed for moderate pain.    Marland Kitchen losartan-hydrochlorothiazide (HYZAAR) 50-12.5 MG tablet     . Rivaroxaban (XARELTO) 20 MG TABS tablet Take 20 mg by mouth daily.    . simvastatin (ZOCOR) 20 MG tablet Take 1 tablet (20 mg total) by mouth daily at 6 PM. 30 tablet 1  . traMADol (ULTRAM) 50 MG tablet Take 1 tablet (50 mg total) by mouth every 6 (six) hours as needed. 60 tablet 5   No current facility-administered medications for this visit.    Past Medical History  Diagnosis Date  . Arthritis   . Diabetes mellitus   . Atrial fibrillation ([place])   .  HTN (hypertension)   . Stroke ([place])   . Seizure disorder [facility])     Past Surgical History  Procedure Laterality Date  . Total knee arthroplasty      Bilateral    Social History   Social History  . Marital Status: Married    Spouse Name: N/A  . Number of Children: N/A  . Years of Education: N/A   Occupational History  . Not on file.   Social History Main Topics  . Smoking status: Former Smoker    Start date: 01/31/1963    Quit date: 01/30/1978  . Smokeless tobacco: Not on file     Comment: quit 30 years ago  . Alcohol Use: No  . Drug Use: No  . Sexual Activity: Not on file   Other Topics Concern  . Not on file   Social History Narrative     Filed Vitals:   04/09/15 1615  BP: 140/90  Pulse: 52  Height: 5\' 6"  (1.676 m)  Weight: 202 lb (91.627 kg)  SpO2: 99%    PHYSICAL EXAM General: NAD HEENT: Normal. Neck: No JVD, no thyromegaly. Lungs: Clear to auscultation bilaterally with normal respiratory effort. CV: Regular rate, irregular rhythm, normal S1/S2, no S3/, no murmur. No pretibial or periankle edema. Abdomen: Soft, nontender, no distention.  Neurologic: Alert and oriented.  Psych: Normal affect. Skin: Normal. Musculoskeletal: No gross deformities.  ECG: Most recent ECG reviewed.      ASSESSMENT AND PLAN: 1. Atrial fibrillation: Symptomatically stable. Remains off  of AV nodal blocking agents altogether, indicative of conduction system disease. Maintained on Xarelto for anticoagulation, as he had problems regulating his INR on warfarin. Will continue to monitor HR. He is currently not in need of a pacemaker.  2. Essential HTN: Borderline elevated. Will need monitoring and possible further adjustments to medication regimen.  3. Hyperlipidemia: Controlled as noted above. Continue simvastatin 20 mg daily.  Dispo: f/u 6 months.   Kate Sable, M.D., F.A.C.C.

## 2015-04-09 NOTE — Patient Instructions (Signed)
Your physician wants you to follow-up in: 6 months with Dr Koneswaran You will receive a reminder letter in the mail two months in advance. If you don't receive a letter, please call our office to schedule the follow-up appointment.    Your physician recommends that you continue on your current medications as directed. Please refer to the Current Medication list given to you today.      If you need a refill on your cardiac medications before your next appointment, please call your pharmacy.      Thank you for choosing [facility] !        

## 2015-04-12 DIAGNOSIS — E1142 Type 2 diabetes mellitus with diabetic polyneuropathy: Secondary | ICD-10-CM | POA: Diagnosis not present

## 2015-04-12 DIAGNOSIS — E1140 Type 2 diabetes mellitus with diabetic neuropathy, unspecified: Secondary | ICD-10-CM | POA: Diagnosis not present

## 2015-04-14 DIAGNOSIS — I10 Essential (primary) hypertension: Secondary | ICD-10-CM | POA: Diagnosis not present

## 2015-04-14 DIAGNOSIS — R8299 Other abnormal findings in urine: Secondary | ICD-10-CM | POA: Diagnosis not present

## 2015-04-14 DIAGNOSIS — E6609 Other obesity due to excess calories: Secondary | ICD-10-CM | POA: Diagnosis not present

## 2015-04-14 DIAGNOSIS — Z6831 Body mass index (BMI) 31.0-31.9, adult: Secondary | ICD-10-CM | POA: Diagnosis not present

## 2015-04-14 DIAGNOSIS — M1991 Primary osteoarthritis, unspecified site: Secondary | ICD-10-CM | POA: Diagnosis not present

## 2015-04-14 DIAGNOSIS — Z1389 Encounter for screening for other disorder: Secondary | ICD-10-CM | POA: Diagnosis not present

## 2015-05-14 DIAGNOSIS — Z6831 Body mass index (BMI) 31.0-31.9, adult: Secondary | ICD-10-CM | POA: Diagnosis not present

## 2015-05-14 DIAGNOSIS — E6609 Other obesity due to excess calories: Secondary | ICD-10-CM | POA: Diagnosis not present

## 2015-05-14 DIAGNOSIS — Z1389 Encounter for screening for other disorder: Secondary | ICD-10-CM | POA: Diagnosis not present

## 2015-05-14 DIAGNOSIS — E119 Type 2 diabetes mellitus without complications: Secondary | ICD-10-CM | POA: Diagnosis not present

## 2015-05-14 DIAGNOSIS — G8929 Other chronic pain: Secondary | ICD-10-CM | POA: Diagnosis not present

## 2015-05-14 DIAGNOSIS — E785 Hyperlipidemia, unspecified: Secondary | ICD-10-CM | POA: Diagnosis not present

## 2015-05-14 DIAGNOSIS — I4891 Unspecified atrial fibrillation: Secondary | ICD-10-CM | POA: Diagnosis not present

## 2015-05-14 DIAGNOSIS — M150 Primary generalized (osteo)arthritis: Secondary | ICD-10-CM | POA: Diagnosis not present

## 2015-06-07 DIAGNOSIS — Z6831 Body mass index (BMI) 31.0-31.9, adult: Secondary | ICD-10-CM | POA: Diagnosis not present

## 2015-06-07 DIAGNOSIS — E782 Mixed hyperlipidemia: Secondary | ICD-10-CM | POA: Diagnosis not present

## 2015-06-07 DIAGNOSIS — Z1389 Encounter for screening for other disorder: Secondary | ICD-10-CM | POA: Diagnosis not present

## 2015-06-07 DIAGNOSIS — I10 Essential (primary) hypertension: Secondary | ICD-10-CM | POA: Diagnosis not present

## 2015-06-14 DIAGNOSIS — H35031 Hypertensive retinopathy, right eye: Secondary | ICD-10-CM | POA: Diagnosis not present

## 2015-06-29 DIAGNOSIS — N182 Chronic kidney disease, stage 2 (mild): Secondary | ICD-10-CM | POA: Diagnosis not present

## 2015-06-29 DIAGNOSIS — E1129 Type 2 diabetes mellitus with other diabetic kidney complication: Secondary | ICD-10-CM | POA: Diagnosis not present

## 2015-06-29 DIAGNOSIS — E559 Vitamin D deficiency, unspecified: Secondary | ICD-10-CM | POA: Diagnosis not present

## 2015-06-29 DIAGNOSIS — I10 Essential (primary) hypertension: Secondary | ICD-10-CM | POA: Diagnosis not present

## 2015-07-08 ENCOUNTER — Other Ambulatory Visit (HOSPITAL_COMMUNITY): Payer: Self-pay | Admitting: Medical

## 2015-07-08 DIAGNOSIS — N183 Chronic kidney disease, stage 3 unspecified: Secondary | ICD-10-CM

## 2015-07-29 ENCOUNTER — Ambulatory Visit (HOSPITAL_COMMUNITY)
Admission: RE | Admit: 2015-07-29 | Discharge: 2015-07-29 | Disposition: A | Discharge status: Home / Self Care | Payer: Medicare Other | Source: Ambulatory Visit | Attending: Medical | Admitting: Medical

## 2015-07-29 DIAGNOSIS — E559 Vitamin D deficiency, unspecified: Secondary | ICD-10-CM | POA: Diagnosis not present

## 2015-07-29 DIAGNOSIS — N183 Chronic kidney disease, stage 3 unspecified: Secondary | ICD-10-CM

## 2015-07-29 DIAGNOSIS — D509 Iron deficiency anemia, unspecified: Secondary | ICD-10-CM | POA: Diagnosis not present

## 2015-07-29 DIAGNOSIS — I10 Essential (primary) hypertension: Secondary | ICD-10-CM | POA: Diagnosis not present

## 2015-07-29 DIAGNOSIS — R809 Proteinuria, unspecified: Secondary | ICD-10-CM | POA: Diagnosis not present

## 2015-07-29 DIAGNOSIS — Z79899 Other long term (current) drug therapy: Secondary | ICD-10-CM | POA: Diagnosis not present

## 2015-07-30 DIAGNOSIS — Z6832 Body mass index (BMI) 32.0-32.9, adult: Secondary | ICD-10-CM | POA: Diagnosis not present

## 2015-07-30 DIAGNOSIS — E119 Type 2 diabetes mellitus without complications: Secondary | ICD-10-CM | POA: Diagnosis not present

## 2015-07-30 DIAGNOSIS — G894 Chronic pain syndrome: Secondary | ICD-10-CM | POA: Diagnosis not present

## 2015-07-30 DIAGNOSIS — E6609 Other obesity due to excess calories: Secondary | ICD-10-CM | POA: Diagnosis not present

## 2015-08-10 DIAGNOSIS — E559 Vitamin D deficiency, unspecified: Secondary | ICD-10-CM | POA: Diagnosis not present

## 2015-08-10 DIAGNOSIS — N182 Chronic kidney disease, stage 2 (mild): Secondary | ICD-10-CM | POA: Diagnosis not present

## 2015-08-10 DIAGNOSIS — I10 Essential (primary) hypertension: Secondary | ICD-10-CM | POA: Diagnosis not present

## 2015-08-10 DIAGNOSIS — E1129 Type 2 diabetes mellitus with other diabetic kidney complication: Secondary | ICD-10-CM | POA: Diagnosis not present

## 2015-08-13 DIAGNOSIS — D320 Benign neoplasm of cerebral meninges: Secondary | ICD-10-CM | POA: Diagnosis not present

## 2015-08-13 DIAGNOSIS — E119 Type 2 diabetes mellitus without complications: Secondary | ICD-10-CM | POA: Diagnosis not present

## 2015-08-13 DIAGNOSIS — I4891 Unspecified atrial fibrillation: Secondary | ICD-10-CM | POA: Diagnosis not present

## 2015-08-13 DIAGNOSIS — I6990 Unspecified sequelae of unspecified cerebrovascular disease: Secondary | ICD-10-CM | POA: Diagnosis not present

## 2015-08-13 DIAGNOSIS — R569 Unspecified convulsions: Secondary | ICD-10-CM | POA: Diagnosis not present

## 2015-08-13 DIAGNOSIS — R5381 Other malaise: Secondary | ICD-10-CM | POA: Diagnosis not present

## 2015-08-13 DIAGNOSIS — F0631 Mood disorder due to known physiological condition with depressive features: Secondary | ICD-10-CM | POA: Diagnosis not present

## 2015-09-06 ENCOUNTER — Encounter: Payer: Self-pay | Admitting: Gastroenterology

## 2015-09-15 ENCOUNTER — Ambulatory Visit: Payer: BC Managed Care – PPO | Admitting: Gastroenterology

## 2015-10-19 ENCOUNTER — Ambulatory Visit: Payer: BC Managed Care – PPO | Admitting: Cardiovascular Disease

## 2015-10-29 ENCOUNTER — Ambulatory Visit: Payer: BC Managed Care – PPO | Admitting: Cardiovascular Disease

## 2016-04-19 ENCOUNTER — Encounter (INDEPENDENT_AMBULATORY_CARE_PROVIDER_SITE_OTHER): Payer: Self-pay | Admitting: Internal Medicine

## 2016-05-01 ENCOUNTER — Ambulatory Visit (INDEPENDENT_AMBULATORY_CARE_PROVIDER_SITE_OTHER): Payer: BC Managed Care – PPO | Admitting: Internal Medicine

## 2016-11-22 ENCOUNTER — Other Ambulatory Visit: Payer: Self-pay | Admitting: Neurology

## 2016-11-22 DIAGNOSIS — D329 Benign neoplasm of meninges, unspecified: Secondary | ICD-10-CM

## 2016-11-22 DIAGNOSIS — I729 Aneurysm of unspecified site: Secondary | ICD-10-CM

## 2016-12-20 ENCOUNTER — Ambulatory Visit (HOSPITAL_COMMUNITY)
Admission: RE | Admit: 2016-12-20 | Discharge: 2016-12-20 | Disposition: A | Discharge status: Home / Self Care | Payer: Medicare Other | Source: Ambulatory Visit | Attending: Neurology | Admitting: Neurology

## 2016-12-20 DIAGNOSIS — G936 Cerebral edema: Secondary | ICD-10-CM | POA: Insufficient documentation

## 2016-12-20 DIAGNOSIS — Z8673 Personal history of transient ischemic attack (TIA), and cerebral infarction without residual deficits: Secondary | ICD-10-CM | POA: Diagnosis not present

## 2016-12-20 DIAGNOSIS — G9389 Other specified disorders of brain: Secondary | ICD-10-CM | POA: Insufficient documentation

## 2016-12-20 DIAGNOSIS — D329 Benign neoplasm of meninges, unspecified: Secondary | ICD-10-CM

## 2016-12-20 DIAGNOSIS — D320 Benign neoplasm of cerebral meninges: Secondary | ICD-10-CM | POA: Insufficient documentation

## 2016-12-20 DIAGNOSIS — I729 Aneurysm of unspecified site: Secondary | ICD-10-CM

## 2016-12-20 LAB — POCT I-STAT CREATININE: Creatinine, Ser: 1.7 mg/dL — ABNORMAL HIGH (ref 0.61–1.24)

## 2016-12-20 MED ORDER — GADOBENATE DIMEGLUMINE 529 MG/ML IV SOLN
20.0000 mL | Freq: Once | INTRAVENOUS | Status: AC | PRN
  Administered 2016-12-20: 10 mL via INTRAVENOUS

## 2017-01-16 ENCOUNTER — Other Ambulatory Visit: Payer: Self-pay

## 2017-01-16 NOTE — Patient Outreach (Signed)
[facility]) Care Management  01/16/2017  GREELY ATIYEH 1946/01/23 [id number]  Medication Adherence call to Mr. Allen Egerton patient is showing past due under [facility] Ins.on Simvastatin 20 mg patient did not answer, spoke with  [place] they said patient already pick up on 12/29/16 for a 90 days supply patient wont be due until January 2019.   Start Management Direct Dial [phone]  Fax [phone] [email or web address]

## 2017-07-11 ENCOUNTER — Other Ambulatory Visit: Payer: Self-pay | Admitting: Pharmacist

## 2017-07-11 NOTE — Patient Outreach (Signed)
[facility]) Care Management  07/11/2017  KYROLLOS CORDELL 12-19-1945 [id number]   Incoming call from Virgina Organ and his wife in response to the EMMI Medication Adherence Campaign. Speak with patient. HIPAA identifiers verified and verbal consent received. Mr. Dirk gives permission for me to speak with his wife about his medications.  Mrs. Guevara reports that Mr. Mabey takes his glimepiride 2 mg three times daily before breakfast, lunch and supper as directed. Denies any missed doses or barriers to adherence. Counsel on the importance of medication adherence. Mrs. Bou reports that she uses a pillbox to organize Mr. Jeffries medications and insure that he does not miss any doses. Reports that he currently has plenty of glimepiride remaining. Reports that the patient's blood sugar was 97 mg/dL first thing this morning. Reports that she checks his blood sugar regularly. Denies any recent low blood sugars.   Mrs. Karwowski reports that she does need to pick up a refill of Mr. Heavin losartan-hydrochlorothiazide. Reports that Mr. Adamczak was at his PCP last week and she requested that a 90 day supply prescription of this medication be called into [facility] for him, but that she has not yet received notification from the pharmacy that the prescription is ready.  Mrs. Whitmoyer denies any further medication questions/concerns on behalf of the patient at this time.   Call to follow up with [facility]. Pharmacy reports that a 90 day supply of the patient's losartan-hydrochlorothiazide has not yet been called in for the patient. Request that the pharmacy fax provider requesting the 90 day supply prescription.  PLAN  1) Will call to follow up with Dr. Delanna Ahmadi office to request that a prescription for a 90 day supply of the patient's losartan-hydrochlorothiazide be called into [facility].  2) Following this call, will close pharmacy episode.  Harlow Asa, PharmD, [facility] [phone]

## 2017-07-11 NOTE — Patient Outreach (Signed)
[facility]) Care Management  07/11/2017  ABENEZER ODONELL 1945-07-03 [id number]   Call to follow up with Dr. Delanna Ahmadi office to request that a prescription for a 90 day supply of the patient's losartan-hydrochlorothiazide be called into [facility]. Leave a message with Coralyn Mark in Dr. Delanna Ahmadi office and request that patient receive a call back once the prescription has been called in.  Harlow Asa, PharmD, [facility] [phone]

## 2017-08-10 ENCOUNTER — Other Ambulatory Visit (HOSPITAL_COMMUNITY): Payer: Self-pay | Admitting: Internal Medicine

## 2017-08-10 ENCOUNTER — Ambulatory Visit (HOSPITAL_COMMUNITY)
Admission: RE | Admit: 2017-08-10 | Discharge: 2017-08-10 | Disposition: A | Discharge status: Home / Self Care | Payer: Medicare Other | Source: Ambulatory Visit | Attending: Internal Medicine | Admitting: Internal Medicine

## 2017-08-10 DIAGNOSIS — I700 Atherosclerosis of aorta: Secondary | ICD-10-CM | POA: Insufficient documentation

## 2017-08-10 DIAGNOSIS — J439 Emphysema, unspecified: Secondary | ICD-10-CM | POA: Diagnosis not present

## 2017-08-10 DIAGNOSIS — R0600 Dyspnea, unspecified: Secondary | ICD-10-CM

## 2017-08-10 DIAGNOSIS — I2510 Atherosclerotic heart disease of native coronary artery without angina pectoris: Secondary | ICD-10-CM | POA: Insufficient documentation

## 2017-08-10 LAB — POCT I-STAT CREATININE: CREATININE: 1.4 mg/dL — AB (ref 0.61–1.24)

## 2017-08-10 MED ORDER — IOPAMIDOL (ISOVUE-370) INJECTION 76%
100.0000 mL | Freq: Once | INTRAVENOUS | Status: AC | PRN
  Administered 2017-08-10: 100 mL via INTRAVENOUS

## 2017-08-16 ENCOUNTER — Encounter: Payer: Self-pay | Admitting: Physician Assistant

## 2017-08-16 NOTE — Progress Notes (Signed)
Cardiology Office Note    Date:  08/17/2017  ID:  JOHNSON Scott, DOB Jun 27, 1945, MRN [id number] PCP:  Brendan Sites, MD  Cardiologist:  Brendan Sable, MD  Chief Complaint: shortness of breath   History of Present Illness:  Brendan Scott is a 72 y.o. male with history of permanent atrial fibrillation, conduction system disease, CKD III per labs, dyslipidemia, hypertension, stroke 2013, DM, seizure disorder, hyperlipidema who presents for follow-up. Has followed with Brendan Scott since 2014. 2D echo 12/2011 showed EF 55-60%, mild Mr, mod LAE, dilated RV with moderate RAE, PASP 54mmHg. He's been on Xarelto. Brendan Scott has felt the presence of rate control in the absence of AVN blockers indicates underlying conduction disease.   He recently developed a week's worth of gradual SOB particularly with exertion. Wife noticed stomach was more bloated. She tends to contribute more to the conversation than he does. They saw primary care on 08/07/17. His blood pressure was running high. They brought in copies of his records which indicate a positive troponin level of 0.10 on 08/07/17 at 9:36am (normal CK,MB), then both 0.09 and 0.08 on 7/10 at 1:48pm, otherwise Hgb 13.0, UN 16, Cr 1.54, Na 140, K 4.3, LFTs OK, A1c 6.9%, LDL 74, TSH wnl. I do not see an EKG. They state they were called and told to go get a CT scan at the hospital. CT angio of chest 08/10/17 which showed no PE, + mild atherosclerosis of aorta, + coronary artery calcifications, mild emphysematous changes in the lung apices, no other acute abnormalities. He was not sent to the ER or admitted. He was asked to start flomax 0.4mg  and Lasix 20mg  daily. Since that time he's had complete resolution of dyspnea. He denies any edema, orthopnea, PND, syncope, dizziness, palpitations or any chest pain whatsoever. He and his wife feel he is 100% better.   Past Medical History:  Diagnosis Date  . Aortic atherosclerosis ([place])   . Arthritis   . CKD  (chronic kidney disease), stage III ([place])   . Coronary artery calcification   . Diabetes mellitus   . HTN (hypertension)   . Hyperlipidemia   . Permanent atrial fibrillation ([place])   . Seizure disorder ([place])   . Stroke [facility])     Past Surgical History:  Procedure Laterality Date  . TOTAL KNEE ARTHROPLASTY     Bilateral    Current Medications: Current Meds  Medication Sig  . ALPRAZolam (XANAX) 0.5 MG tablet Take 0.5 mg by mouth 3 (three) times daily.   Marland Kitchen amLODipine (NORVASC) 5 MG tablet Take 1 tablet (5 mg total) by mouth daily.  . furosemide (LASIX) 20 MG tablet Take 1 tablet by mouth daily.  Marland Kitchen glimepiride (AMARYL) 2 MG tablet Take 2 mg by mouth 3 (three) times daily before meals.   Marland Kitchen HYDROcodone-acetaminophen (NORCO/VICODIN) 5-325 MG per tablet Take 1 tablet by mouth every 6 (six) hours as needed for moderate pain.  Marland Kitchen losartan (COZAAR) 100 MG tablet Take 1 tablet by mouth daily.  . Rivaroxaban (XARELTO) 20 MG TABS tablet Take 20 mg by mouth daily.  . simvastatin (ZOCOR) 20 MG tablet Take 1 tablet (20 mg total) by mouth daily at 6 PM.  . tamsulosin (FLOMAX) 0.4 MG CAPS capsule Take 1 capsule by mouth daily.  . traMADol (ULTRAM) 50 MG tablet Take 1 tablet (50 mg total) by mouth every 6 (six) hours as needed.      Allergies:   Patient has no known allergies.   Social  History   Socioeconomic History  . Marital status: Married    Spouse name: Not on file  . Number of children: Not on file  . Years of education: Not on file  . Highest education level: Not on file  Occupational History  . Not on file  Social Needs  . Financial resource strain: Not on file  . Food insecurity:    Worry: Not on file    Inability: Not on file  . Transportation needs:    Medical: Not on file    Non-medical: Not on file  Tobacco Use  . Smoking status: Former Smoker    Start date: 01/31/1963    Last attempt to quit: 01/30/1978    Years since quitting: 39.5  . Smokeless tobacco: Never Used  .  Tobacco comment: quit 30 years ago  Substance and Sexual Activity  . Alcohol use: No    Alcohol/week: 0.0 oz  . Drug use: No  . Sexual activity: Not on file  Lifestyle  . Physical activity:    Days per week: Not on file    Minutes per session: Not on file  . Stress: Not on file  Relationships  . Social connections:    Talks on phone: Not on file    Gets together: Not on file    Attends religious service: Not on file    Active member of club or organization: Not on file    Attends meetings of clubs or organizations: Not on file    Relationship status: Not on file  Other Topics Concern  . Not on file  Social History Narrative  . Not on file     Family History:  The patient's family history includes Arthritis in his unknown relative; Diabetes in his unknown relative.  ROS:   Please see the history of present illness.   All other systems are reviewed and otherwise negative.    PHYSICAL EXAM:   VS:  BP 128/74   Pulse 60   Ht 5\' 6"  (1.676 m)   Wt 214 lb 12.8 oz (97.4 kg)   SpO2 97% Comment: on room air  BMI 34.67 kg/m   BMI: Body mass index is 34.67 kg/m. GEN: Well nourished, well developed AAM, in no acute distress HEENT: normocephalic, atraumatic Neck: no JVD, carotid bruits, or masses Cardiac: RRR; no murmurs, rubs, or gallops, no edema  Respiratory:  clear to auscultation bilaterally, normal work of breathing GI: soft, nontender, nondistended, + BS MS: no deformity or atrophy Skin: warm and dry, no rash Neuro:  Alert and Oriented x 3, Strength and sensation are intact, follows commands Psych: euthymic mood, full affect  Wt Readings from Last 3 Encounters:  08/17/17 214 lb 12.8 oz (97.4 kg)  04/09/15 202 lb (91.6 kg)  09/15/14 191 lb 12.8 oz (87 kg)      Studies/Labs Reviewed:   EKG:  EKG was ordered today and personally reviewed by me and demonstrates atrial fib 55bpm, LAFB, no acute ST-T changes  Recent Labs: 08/10/2017: Creatinine, Ser 1.40   Lipid  Panel    Component Value Date/Time   CHOL 184 12/16/2011 0430   TRIG 51 12/16/2011 0430   HDL 60 12/16/2011 0430   CHOLHDL 3.1 12/16/2011 0430   VLDL 10 12/16/2011 0430   LDLCALC 114 (H) 12/16/2011 0430    Additional studies/ records that were reviewed today include: Summarized above.   ASSESSMENT & PLAN:   1. Shortness of breath - unusual situation, outpatient troponins were positive on 7/9  and 7/10. It is unclear if this represented an acute coronary syndrome. He was treated for what sounds like presumed CHF and uncontrolled BP and had complete resolution of symptoms. I discussed with Dr. Harl Bowie. Given marked clinical improvement and now asymptomatic nature, we did not admit to the hospital but will instead proceed with OP workup. Will continue current regimen and check echocardiogram. If LVEF is down or any new WMA, would need to recommend proceeding with cath. If LVEF is normal, would suggest stress testing to discern if elevated troponin represented underlying blockage. This could have been due to HTN, volume overload and renal dysfunction but that remains unclear. I reviewed idea of ASA with Dr. Harl Bowie; given clinical stability he recommended to hold off. ER precautions reviewed. Reviewed 2g sodium restriction, 2L fluid restriction, daily weights with patient and wife. 2. Permanent atrial fibrillation - rate controlled. Continues on Xarelto. Recheck Cr today to ensure OK to continue present dose. 3. CKD stage III - recheck Cr today. Follows with nephrology Dr. Hinda Lenis. 4. Hypertension - improved on current regimen. 5. Hyperlipidemia - followed by primary care. Would recommend primary care consider titrating statin instead to atorvastatin given recent LDL slightly above goal (unable to increase simvastatin further due to concomitant amlodipine).  Disposition: F/u with APP in ~2 weeks (next available).  Medication Adjustments/Labs and Tests Ordered: Current medicines are reviewed at  length with the patient today.  Concerns regarding medicines are outlined above. Medication changes, Labs and Tests ordered today are summarized above and listed in the Patient Instructions accessible in Encounters.   Signed, Charlie Pitter, PA-C  08/17/2017 3:28 PM    [facility] Location in [place], [place] [address] Ph: [phone]; Fax [phone]

## 2017-08-17 ENCOUNTER — Other Ambulatory Visit (HOSPITAL_COMMUNITY)
Admission: RE | Admit: 2017-08-17 | Discharge: 2017-08-17 | Disposition: A | Discharge status: Home / Self Care | Payer: Medicare Other | Source: Ambulatory Visit | Attending: Physician Assistant | Admitting: Physician Assistant

## 2017-08-17 ENCOUNTER — Encounter: Payer: Self-pay | Admitting: Physician Assistant

## 2017-08-17 ENCOUNTER — Ambulatory Visit: Payer: Medicare Other | Admitting: Physician Assistant

## 2017-08-17 VITALS — BP 128/74 | HR 60 | Ht 66.0 in | Wt 214.8 lb

## 2017-08-17 DIAGNOSIS — I482 Chronic atrial fibrillation: Secondary | ICD-10-CM

## 2017-08-17 DIAGNOSIS — R0602 Shortness of breath: Secondary | ICD-10-CM | POA: Diagnosis not present

## 2017-08-17 DIAGNOSIS — I10 Essential (primary) hypertension: Secondary | ICD-10-CM | POA: Insufficient documentation

## 2017-08-17 DIAGNOSIS — R778 Other specified abnormalities of plasma proteins: Secondary | ICD-10-CM

## 2017-08-17 DIAGNOSIS — N183 Chronic kidney disease, stage 3 unspecified: Secondary | ICD-10-CM

## 2017-08-17 DIAGNOSIS — E785 Hyperlipidemia, unspecified: Secondary | ICD-10-CM

## 2017-08-17 DIAGNOSIS — I4821 Permanent atrial fibrillation: Secondary | ICD-10-CM

## 2017-08-17 DIAGNOSIS — R748 Abnormal levels of other serum enzymes: Secondary | ICD-10-CM | POA: Diagnosis not present

## 2017-08-17 DIAGNOSIS — R7989 Other specified abnormal findings of blood chemistry: Secondary | ICD-10-CM

## 2017-08-17 LAB — BASIC METABOLIC PANEL
Anion gap: 7 (ref 5–15)
BUN: 17 mg/dL (ref 8–23)
CHLORIDE: 108 mmol/L (ref 98–111)
CO2: 23 mmol/L (ref 22–32)
Calcium: 9.4 mg/dL (ref 8.9–10.3)
Creatinine, Ser: 1.6 mg/dL — ABNORMAL HIGH (ref 0.61–1.24)
GFR calc Af Amer: 48 mL/min — ABNORMAL LOW (ref >60)
GFR calc non Af Amer: 42 mL/min — ABNORMAL LOW (ref >60)
GLUCOSE: 107 mg/dL — AB (ref 70–99)
POTASSIUM: 4.4 mmol/L (ref 3.5–5.1)
Sodium: 138 mmol/L (ref 135–145)

## 2017-08-17 NOTE — Patient Instructions (Addendum)
For patients with congestive heart failure, we give them these special instructions:  1. Follow a low-salt diet - you are allowed no more than 2,000mg  of sodium per day. Watch your fluid intake. In general, you should not be taking in more than 2 liters of fluid per day (no more than 8 glasses per day). This includes sources of water in foods like soup, coffee, tea, milk, etc. 2. Weigh yourself on the same scale at same time of day and keep a log. 3. Call your doctor: (Anytime you feel any of the following symptoms)  - 3lb weight gain overnight or 5lb within a few days - Shortness of breath, with or without a dry hacking cough  - Swelling in the hands, feet or stomach  - If you have to sleep on extra pillows at night in order to breathe   IT IS IMPORTANT TO LET YOUR DOCTOR KNOW EARLY ON IF YOU ARE HAVING SYMPTOMS SO WE CAN HELP YOU!   Medication Instructions:  Your physician recommends that you continue on your current medications as directed. Please refer to the Current Medication list given to you today.   Labwork: Your physician recommends that you return for lab work in: Today    Testing/Procedures: Your physician has requested that you have an echocardiogram. Echocardiography is a painless test that uses sound waves to create images of your heart. It provides your doctor with information about the size and shape of your heart and how well your heart's chambers and valves are working. This procedure takes approximately one hour. There are no restrictions for this procedure.   Follow-Up: Your physician recommends that you schedule a follow-up appointment in: Next Available after echo.   Any Other Special Instructions Will Be Listed Below (If Applicable).     If you need a refill on your cardiac medications before your next appointment, please call your pharmacy. Thank you for choosing [facility]!

## 2017-08-23 ENCOUNTER — Ambulatory Visit (HOSPITAL_COMMUNITY)
Admission: RE | Admit: 2017-08-23 | Discharge: 2017-08-23 | Disposition: A | Discharge status: Home / Self Care | Payer: Medicare Other | Source: Ambulatory Visit | Attending: Family Medicine | Admitting: Family Medicine

## 2017-08-23 DIAGNOSIS — I482 Chronic atrial fibrillation: Secondary | ICD-10-CM | POA: Diagnosis not present

## 2017-08-23 DIAGNOSIS — R0602 Shortness of breath: Secondary | ICD-10-CM | POA: Diagnosis not present

## 2017-08-23 DIAGNOSIS — I119 Hypertensive heart disease without heart failure: Secondary | ICD-10-CM | POA: Diagnosis not present

## 2017-08-23 DIAGNOSIS — E119 Type 2 diabetes mellitus without complications: Secondary | ICD-10-CM | POA: Diagnosis not present

## 2017-08-23 DIAGNOSIS — Z8673 Personal history of transient ischemic attack (TIA), and cerebral infarction without residual deficits: Secondary | ICD-10-CM | POA: Diagnosis not present

## 2017-08-23 DIAGNOSIS — I10 Essential (primary) hypertension: Secondary | ICD-10-CM | POA: Diagnosis not present

## 2017-08-23 DIAGNOSIS — I081 Rheumatic disorders of both mitral and tricuspid valves: Secondary | ICD-10-CM | POA: Diagnosis not present

## 2017-08-23 DIAGNOSIS — I4821 Permanent atrial fibrillation: Secondary | ICD-10-CM

## 2017-08-23 NOTE — Progress Notes (Signed)
*  PRELIMINARY RESULTS* Echocardiogram 2D Echocardiogram has been performed.  Brendan Scott 08/23/2017, 10:02 AM

## 2017-08-27 ENCOUNTER — Telehealth: Payer: Self-pay | Admitting: *Deleted

## 2017-08-27 NOTE — Telephone Encounter (Signed)
-----   Message from Charlie Pitter, [place] sent at 08/23/2017 12:40 PM EDT ----- Please call patient. Echo shows stiff but strong heart. There is moderate leakiness of his mitral valve which we will follow over time. Overall though nothing acute on this echo - I would suggest going ahead with a Lexiscan nuclear stress test to eval for underlying blockage, but I would like to check with Dr. Bronson Ing first given prior mention of conduction system disease in his notes. Lexiscan is contraindicated for 2nd/3rd degree HB but I only see prior mentions of bradycardia in setting of afib, and some junctional in the past. Will forward to Dr. Raliegh Ip to discern if OK to proceed with Lexi (recently elevated OP troponin at PCP's office - kind of unusual situation). Dayna Dunn PA-C

## 2017-08-27 NOTE — Telephone Encounter (Signed)
Called patient with test results. No answer. Left message to call back.  

## 2017-08-28 ENCOUNTER — Telehealth: Payer: Self-pay | Admitting: Physician Assistant

## 2017-08-28 ENCOUNTER — Encounter: Payer: Self-pay | Admitting: *Deleted

## 2017-08-28 ENCOUNTER — Telehealth: Payer: Self-pay | Admitting: *Deleted

## 2017-08-28 DIAGNOSIS — R079 Chest pain, unspecified: Secondary | ICD-10-CM

## 2017-08-28 NOTE — Telephone Encounter (Signed)
Pt's wife returned call for results

## 2017-08-28 NOTE — Telephone Encounter (Signed)
-----   Message from Charlie Pitter, [place] sent at 08/23/2017 12:40 PM EDT ----- Please call patient. Echo shows stiff but strong heart. There is moderate leakiness of his mitral valve which we will follow over time. Overall though nothing acute on this echo - I would suggest going ahead with a Lexiscan nuclear stress test to eval for underlying blockage, but I would like to check with Dr. Bronson Ing first given prior mention of conduction system disease in his notes. Lexiscan is contraindicated for 2nd/3rd degree HB but I only see prior mentions of bradycardia in setting of afib, and some junctional in the past. Will forward to Dr. Raliegh Ip to discern if OK to proceed with Lexi (recently elevated OP troponin at PCP's office - kind of unusual situation). Dayna Dunn PA-C

## 2017-08-28 NOTE — Telephone Encounter (Signed)
Pt and wife notified of test results  

## 2017-09-03 ENCOUNTER — Encounter (HOSPITAL_COMMUNITY)
Admission: RE | Admit: 2017-09-03 | Discharge: 2017-09-03 | Disposition: A | Discharge status: Home / Self Care | Payer: Medicare Other | Source: Ambulatory Visit | Attending: Cardiovascular Disease | Admitting: Cardiovascular Disease

## 2017-09-03 ENCOUNTER — Encounter (HOSPITAL_BASED_OUTPATIENT_CLINIC_OR_DEPARTMENT_OTHER)
Admission: RE | Admit: 2017-09-03 | Discharge: 2017-09-03 | Disposition: A | Discharge status: Home / Self Care | Payer: Medicare Other | Source: Ambulatory Visit | Attending: Cardiovascular Disease | Admitting: Cardiovascular Disease

## 2017-09-03 ENCOUNTER — Encounter (HOSPITAL_COMMUNITY): Payer: Self-pay

## 2017-09-03 DIAGNOSIS — R079 Chest pain, unspecified: Secondary | ICD-10-CM

## 2017-09-03 LAB — NM MYOCAR MULTI W/SPECT W/WALL MOTION / EF
CHL CUP NUCLEAR SDS: 2
CHL CUP NUCLEAR SRS: 1
CHL CUP NUCLEAR SSS: 3
CSEPPHR: 81 {beats}/min
LV dias vol: 122 mL (ref 62–150)
LVSYSVOL: 47 mL
RATE: 0.44
Rest HR: 45 {beats}/min
TID: 0.94

## 2017-09-03 MED ORDER — SODIUM CHLORIDE 0.9% FLUSH
INTRAVENOUS | Status: AC
  Administered 2017-09-03: 10 mL via INTRAVENOUS
  Filled 2017-09-03: qty 10

## 2017-09-03 MED ORDER — TECHNETIUM TC 99M TETROFOSMIN IV KIT
10.0000 | PACK | Freq: Once | INTRAVENOUS | Status: AC | PRN
  Administered 2017-09-03: 10 via INTRAVENOUS

## 2017-09-03 MED ORDER — TECHNETIUM TC 99M TETROFOSMIN IV KIT
30.0000 | PACK | Freq: Once | INTRAVENOUS | Status: AC | PRN
  Administered 2017-09-03: 31 via INTRAVENOUS

## 2017-09-03 MED ORDER — REGADENOSON 0.4 MG/5ML IV SOLN
INTRAVENOUS | Status: AC
  Administered 2017-09-03: 0.4 mg via INTRAVENOUS
  Filled 2017-09-03: qty 5

## 2017-09-06 ENCOUNTER — Encounter: Payer: Self-pay | Admitting: *Deleted

## 2017-09-06 ENCOUNTER — Encounter: Payer: Self-pay | Admitting: Cardiovascular Disease

## 2017-09-06 ENCOUNTER — Ambulatory Visit (INDEPENDENT_AMBULATORY_CARE_PROVIDER_SITE_OTHER): Payer: Medicare Other | Admitting: Cardiovascular Disease

## 2017-09-06 VITALS — BP 144/80 | HR 50 | Ht 65.0 in | Wt 213.0 lb

## 2017-09-06 DIAGNOSIS — I482 Chronic atrial fibrillation: Secondary | ICD-10-CM

## 2017-09-06 DIAGNOSIS — I38 Endocarditis, valve unspecified: Secondary | ICD-10-CM

## 2017-09-06 DIAGNOSIS — I10 Essential (primary) hypertension: Secondary | ICD-10-CM | POA: Diagnosis not present

## 2017-09-06 DIAGNOSIS — I119 Hypertensive heart disease without heart failure: Secondary | ICD-10-CM | POA: Diagnosis not present

## 2017-09-06 DIAGNOSIS — R9439 Abnormal result of other cardiovascular function study: Secondary | ICD-10-CM

## 2017-09-06 DIAGNOSIS — I4821 Permanent atrial fibrillation: Secondary | ICD-10-CM

## 2017-09-06 DIAGNOSIS — E785 Hyperlipidemia, unspecified: Secondary | ICD-10-CM

## 2017-09-06 DIAGNOSIS — I25118 Atherosclerotic heart disease of native coronary artery with other forms of angina pectoris: Secondary | ICD-10-CM

## 2017-09-06 MED ORDER — ATORVASTATIN CALCIUM 40 MG PO TABS: 40.0000 mg | ORAL_TABLET | Freq: Every day | ORAL | 6 refills | Status: DC

## 2017-09-06 NOTE — Progress Notes (Signed)
SUBJECTIVE: The patient presents for follow-up after undergoing cardiovascular testing for shortness of breath and troponin elevation.  He has a history of permanent atrial fibrillation, conduction system disease, dyslipidemia, and hypertension. I have not seen him since March 2017.  He saw Melina Copa PA-C in July of this year. He complained of a prior episode of exertional dyspnea during that office visit.  I reviewed nuclear stress test from 09/03/2017 which demonstrated prior small apical myocardial infarction with mild peri-infarct ischemia.  It was deemed a low risk study.  Echocardiogram 08/23/2017: Normal left ventricular systolic function and regional wall motion, LVEF 60 to 65%, moderate LVH, mild to moderate mitral regurgitation, moderate to severe biatrial dilatation, mild tricuspid regurgitation.  The patient denies any symptoms of chest pain, palpitations, shortness of breath, lightheadedness, dizziness, leg swelling, orthopnea, PND, and syncope.    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  Review of Systems: As per "subjective", otherwise negative.  No Known Allergies  Current Outpatient Medications  Medication Sig Dispense Refill  . ALPRAZolam (XANAX) 0.5 MG tablet Take 0.5 mg by mouth 3 (three) times daily.     Marland Kitchen amLODipine (NORVASC) 5 MG tablet Take 1 tablet (5 mg total) by mouth daily. 90 tablet 3  . furosemide (LASIX) 20 MG tablet Take 1 tablet by mouth daily.    Marland Kitchen glimepiride (AMARYL) 2 MG tablet Take 2 mg by mouth 3 (three) times daily before meals.     Marland Kitchen HYDROcodone-acetaminophen (NORCO/VICODIN) 5-325 MG per tablet Take 1 tablet by mouth every 6 (six) hours as needed for moderate pain.    Marland Kitchen losartan (COZAAR) 100 MG tablet Take 1 tablet by mouth daily.    . Rivaroxaban (XARELTO) 20 MG TABS tablet Take 20 mg by mouth daily.    . simvastatin (ZOCOR) 20 MG tablet Take 1 tablet (20 mg total) by mouth daily at 6 PM. 30 tablet 1  .  tamsulosin (FLOMAX) 0.4 MG CAPS capsule Take 1 capsule by mouth daily.    . traMADol (ULTRAM) 50 MG tablet Take 1 tablet (50 mg total) by mouth every 6 (six) hours as needed. 60 tablet 5   No current facility-administered medications for this visit.     Past Medical History:  Diagnosis Date  . Aortic atherosclerosis ([place])   . Arthritis   . CKD (chronic kidney disease), stage III ([place])   . Coronary artery calcification   . Diabetes mellitus   . HTN (hypertension)   . Hyperlipidemia   . Permanent atrial fibrillation ([place])   . Seizure disorder ([place])   . Stroke [facility])     Past Surgical History:  Procedure Laterality Date  . TOTAL KNEE ARTHROPLASTY     Bilateral    Social History   Socioeconomic History  . Marital status: Married    Spouse name: Not on file  . Number of children: Not on file  . Years of education: Not on file  . Highest education level: Not on file  Occupational History  . Not on file  Social Needs  . Financial resource strain: Not on file  . Food insecurity:    Worry: Not on file    Inability: Not on file  . Transportation needs:    Medical: Not on file    Non-medical: Not on file  Tobacco Use  . Smoking status: Former Smoker    Start date: 01/31/1963    Last attempt to quit: 01/30/1978  Years since quitting: 39.6  . Smokeless tobacco: Never Used  . Tobacco comment: quit 30 years ago  Substance and Sexual Activity  . Alcohol use: No    Alcohol/week: 0.0 standard drinks  . Drug use: No  . Sexual activity: Not on file  Lifestyle  . Physical activity:    Days per week: Not on file    Minutes per session: Not on file  . Stress: Not on file  Relationships  . Social connections:    Talks on phone: Not on file    Gets together: Not on file    Attends religious service: Not on file    Active member of club or organization: Not on file    Attends meetings of clubs or organizations: Not on file    Relationship status: Not on file  . Intimate  partner violence:    Fear of current or ex partner: Not on file    Emotionally abused: Not on file    Physically abused: Not on file    Forced sexual activity: Not on file  Other Topics Concern  . Not on file  Social History Narrative  . Not on file     Vitals:   09/06/17 1001  BP: (!) 144/80  Pulse: (!) 50  SpO2: 98%  Weight: 213 lb (96.6 kg)  Height: 5\' 5"  (1.651 m)    Wt Readings from Last 3 Encounters:  09/06/17 213 lb (96.6 kg)  08/17/17 214 lb 12.8 oz (97.4 kg)  04/09/15 202 lb (91.6 kg)     PHYSICAL EXAM General: NAD HEENT: Normal. Neck: No JVD, no thyromegaly. Lungs: Clear to auscultation bilaterally with normal respiratory effort. CV: Bradycardic, irregular rhythm, normal S1/S2, no S3, no murmur. No pretibial or periankle edema.  No carotid bruit.   Abdomen: Soft, nontender, no distention.  Neurologic: Alert and oriented.  Psych: Normal affect. Skin: Normal. Musculoskeletal: No gross deformities.    ECG: Reviewed above under Subjective   Labs: Lab Results  Component Value Date/Time   K 4.4 08/17/2017 03:46 PM   BUN 17 08/17/2017 03:46 PM   CREATININE 1.60 (H) 08/17/2017 03:46 PM   CREATININE 1.55 (H) 10/17/2014 10:08 AM   ALT 75 (H) 12/29/2011 05:55 AM   HGB 14.9 01/19/2012 06:00 AM     Lipids: Lab Results  Component Value Date/Time   LDLCALC 114 (H) 12/16/2011 04:30 AM   CHOL 184 12/16/2011 04:30 AM   TRIG 51 12/16/2011 04:30 AM   HDL 60 12/16/2011 04:30 AM       ASSESSMENT AND PLAN: 1.  Permanent atrial fibrillation: Symptomatically stable. Remains off of AV nodal blocking agents altogether with bradycardia, indicative of conduction system disease. Maintained on Xarelto for anticoagulation, as he had problems regulating his INR on warfarin. Will continue to monitor HR. He is currently not in need of a pacemaker.  2. Essential HTN with hypertensive heart disease: Blood pressure is elevated.  His wife checks it for him at home on a daily  basis and it has been normal.  No changes to therapy.  3. Hyperlipidemia: Given coronary disease, I will switch to Lipitor 40 mg.  4.  Abnormal nuclear stress test/presumed coronary artery disease with prior MI: Symptomatically stable.  Stress test was low risk.  I will switch simvastatin 20 mg to Lipitor 40 mg.  No aspirin as he is on Xarelto.  No beta-blockers as he has conduction system disease.  5.  Valvular heart disease: Symptomatically stable.  I will monitor clinically  and with surveillance echocardiography.   Disposition: Follow up 6 months   Kate Sable, M.D., F.A.C.C.

## 2017-09-06 NOTE — Patient Instructions (Signed)
Medication Instructions:   Stop Simvastatin.  Begin Lipitor 40mg  daily.   Continue all other medications.    Labwork: none  Testing/Procedures: none  Follow-Up: Your physician wants you to follow up in: 6 months.  You will receive a reminder letter in the mail one-two months in advance.  If you don't receive a letter, please call our office to schedule the follow up appointment   Any Other Special Instructions Will Be Listed Below (If Applicable).  If you need a refill on your cardiac medications before your next appointment, please call your pharmacy.

## 2018-01-07 ENCOUNTER — Other Ambulatory Visit: Payer: Self-pay | Admitting: *Deleted

## 2018-01-07 MED ORDER — ATORVASTATIN CALCIUM 40 MG PO TABS: 40.0000 mg | ORAL_TABLET | Freq: Every day | ORAL | 2 refills | Status: DC

## 2018-08-28 ENCOUNTER — Other Ambulatory Visit: Payer: Self-pay | Admitting: Cardiovascular Disease

## 2018-10-17 ENCOUNTER — Other Ambulatory Visit: Payer: Self-pay | Admitting: Physician Assistant

## 2018-10-17 ENCOUNTER — Other Ambulatory Visit (HOSPITAL_COMMUNITY): Payer: Self-pay | Admitting: Physician Assistant

## 2018-10-17 DIAGNOSIS — D329 Benign neoplasm of meninges, unspecified: Secondary | ICD-10-CM

## 2018-10-22 ENCOUNTER — Other Ambulatory Visit: Payer: Self-pay | Admitting: Cardiovascular Disease

## 2018-10-22 MED ORDER — ATORVASTATIN CALCIUM 40 MG PO TABS: 40.0000 mg | ORAL_TABLET | Freq: Every day | ORAL | 1 refills | Status: DC

## 2018-10-22 NOTE — Telephone Encounter (Signed)
Medication sent to pharmacy  

## 2018-10-22 NOTE — Telephone Encounter (Signed)
° ° °  1. Which medications need to be refilled? (please list name of each medication and dose if known)     Lipitor 40 mg   2. Which pharmacy/location (including street and city if local pharmacy) is medication to be sent to? [facility] mail order   3. Do they need a 30 day or 90 day supply?  [place]

## 2018-10-24 ENCOUNTER — Ambulatory Visit (HOSPITAL_COMMUNITY): Admission: RE | Admit: 2018-10-24 | Payer: Medicare Other | Source: Ambulatory Visit

## 2018-10-29 ENCOUNTER — Ambulatory Visit (HOSPITAL_COMMUNITY)
Admission: RE | Admit: 2018-10-29 | Discharge: 2018-10-29 | Disposition: A | Discharge status: Home / Self Care | Payer: Medicare Other | Source: Ambulatory Visit | Attending: Physician Assistant | Admitting: Physician Assistant

## 2018-10-29 ENCOUNTER — Other Ambulatory Visit: Payer: Self-pay

## 2018-10-29 DIAGNOSIS — D329 Benign neoplasm of meninges, unspecified: Secondary | ICD-10-CM | POA: Diagnosis not present

## 2018-10-29 MED ORDER — GADOBUTROL 1 MMOL/ML IV SOLN
10.0000 mL | Freq: Once | INTRAVENOUS | Status: AC | PRN
  Administered 2018-10-29: 18:00:00 10 mL via INTRAVENOUS

## 2018-11-14 ENCOUNTER — Telehealth: Payer: Medicare Other | Admitting: Cardiovascular Disease

## 2018-11-21 ENCOUNTER — Encounter: Payer: Self-pay | Admitting: *Deleted

## 2018-11-21 ENCOUNTER — Telehealth: Payer: Self-pay | Admitting: Cardiovascular Disease

## 2018-11-21 NOTE — Telephone Encounter (Signed)
Advised that lab work will be requested from PCP and kidney doctor for Dr. Raliegh Ip to review, then decide when lab work is needed again. Verbalized understanding.

## 2018-11-21 NOTE — Telephone Encounter (Signed)
Patient called stating that he would like to see if Dr. Bronson Ing will order labs before his next visit. States that he wants to have his kidneys checked.

## 2018-12-12 ENCOUNTER — Encounter: Payer: Self-pay | Admitting: *Deleted

## 2018-12-13 ENCOUNTER — Encounter: Payer: Self-pay | Admitting: Cardiovascular Disease

## 2018-12-13 ENCOUNTER — Ambulatory Visit (INDEPENDENT_AMBULATORY_CARE_PROVIDER_SITE_OTHER): Payer: Medicare Other | Admitting: Cardiovascular Disease

## 2018-12-13 ENCOUNTER — Other Ambulatory Visit: Payer: Self-pay

## 2018-12-13 VITALS — BP 142/68 | HR 68 | Ht 66.0 in | Wt 222.0 lb

## 2018-12-13 DIAGNOSIS — E785 Hyperlipidemia, unspecified: Secondary | ICD-10-CM | POA: Diagnosis not present

## 2018-12-13 DIAGNOSIS — I119 Hypertensive heart disease without heart failure: Secondary | ICD-10-CM

## 2018-12-13 DIAGNOSIS — I10 Essential (primary) hypertension: Secondary | ICD-10-CM | POA: Diagnosis not present

## 2018-12-13 DIAGNOSIS — I38 Endocarditis, valve unspecified: Secondary | ICD-10-CM

## 2018-12-13 DIAGNOSIS — I4821 Permanent atrial fibrillation: Secondary | ICD-10-CM

## 2018-12-13 DIAGNOSIS — I25118 Atherosclerotic heart disease of native coronary artery with other forms of angina pectoris: Secondary | ICD-10-CM

## 2018-12-13 DIAGNOSIS — R9439 Abnormal result of other cardiovascular function study: Secondary | ICD-10-CM

## 2018-12-13 NOTE — Patient Instructions (Addendum)

## 2018-12-13 NOTE — Progress Notes (Signed)
SUBJECTIVE: The patient presents for routine follow-up.  He has a history of permanent atrial fibrillation, conduction system disease, dyslipidemia, and hypertension.  Nuclear stress test 09/03/2017 demonstrated prior small apical myocardial infarction with mild peri-infarct ischemia.  It was deemed a low risk study.  Echocardiogram 08/23/2017: Normal left ventricular systolic function and regional wall motion, LVEF 60 to 65%, moderate LVH, mild to moderate mitral regurgitation, moderate to severe biatrial dilatation, mild tricuspid regurgitation.  ECG performed today which I personally reviewed demonstrates rate controlled atrial fibrillation with PVCs.  The patient denies any symptoms of chest pain, palpitations, lightheadedness, dizziness, leg swelling, orthopnea, PND, and syncope.  I reviewed labs from March and from September.  Creatinine went from 1.42-1.64.  Lipids on 04/15/2018 showed total cholesterol 125, triglycerides 51, HDL 54, LDL 61.   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  Review of Systems: As per "subjective", otherwise negative.  No Known Allergies  Current Outpatient Medications  Medication Sig Dispense Refill  . amLODipine (NORVASC) 10 MG tablet Take 10 mg by mouth daily.    Marland Kitchen atorvastatin (LIPITOR) 40 MG tablet Take 1 tablet (40 mg total) by mouth daily. 90 tablet 1  . glimepiride (AMARYL) 4 MG tablet Take 8 mg by mouth daily with breakfast.    . HYDROcodone-acetaminophen (NORCO/VICODIN) 5-325 MG per tablet Take 1 tablet by mouth every 6 (six) hours as needed for moderate pain.    Marland Kitchen losartan (COZAAR) 100 MG tablet Take 1 tablet by mouth daily.    . Rivaroxaban (XARELTO) 20 MG TABS tablet Take 20 mg by mouth daily.    . sitaGLIPtin (JANUVIA) 50 MG tablet Take 50 mg by mouth daily.    Marland Kitchen terazosin (HYTRIN) 2 MG capsule Take 2 mg by mouth at bedtime.     No current facility-administered medications for this visit.      Past Medical History:  Diagnosis Date  . Aortic atherosclerosis ([place])   . Arthritis   . CKD (chronic kidney disease), stage III   . Coronary artery calcification   . Diabetes mellitus   . HTN (hypertension)   . Hyperlipidemia   . Permanent atrial fibrillation ([place])   . Seizure disorder ([place])   . Stroke [facility])     Past Surgical History:  Procedure Laterality Date  . TOTAL KNEE ARTHROPLASTY     Bilateral    Social History   Socioeconomic History  . Marital status: Married    Spouse name: Not on file  . Number of children: Not on file  . Years of education: Not on file  . Highest education level: Not on file  Occupational History  . Not on file  Social Needs  . Financial resource strain: Not on file  . Food insecurity    Worry: Not on file    Inability: Not on file  . Transportation needs    Medical: Not on file    Non-medical: Not on file  Tobacco Use  . Smoking status: Former Smoker    Start date: 01/31/1963    Quit date: 01/30/1978    Years since quitting: 40.8  . Smokeless tobacco: Never Used  . Tobacco comment: quit 30 years ago  Substance and Sexual Activity  . Alcohol use: No    Alcohol/week: 0.0 standard drinks  . Drug use: No  . Sexual activity: Not on file  Lifestyle  . Physical activity    Days per week: Not on file  Minutes per session: Not on file  . Stress: Not on file  Relationships  . Social Herbalist on phone: Not on file    Gets together: Not on file    Attends religious service: Not on file    Active member of club or organization: Not on file    Attends meetings of clubs or organizations: Not on file    Relationship status: Not on file  . Intimate partner violence    Fear of current or ex partner: Not on file    Emotionally abused: Not on file    Physically abused: Not on file    Forced sexual activity: Not on file  Other Topics Concern  . Not on file  Social History Narrative  . Not on file     Vitals:   12/13/18  1357  BP: (!) 142/68  Pulse: 68  SpO2: 98%  Weight: 222 lb (100.7 kg)  Height: 5\' 6"  (1.676 m)    Wt Readings from Last 3 Encounters:  12/13/18 222 lb (100.7 kg)  09/06/17 213 lb (96.6 kg)  08/17/17 214 lb 12.8 oz (97.4 kg)     PHYSICAL EXAM General: NAD HEENT: Normal. Neck: No JVD, no thyromegaly. Lungs: Clear to auscultation bilaterally with normal respiratory effort. CV: Regular rate and irregular rhythm, normal S1/S2, no S3, no murmur. No pretibial or periankle edema.   Abdomen: Soft, nontender, no distention.  Neurologic: Alert and oriented.  Psych: Normal affect. Skin: Normal. Musculoskeletal: No gross deformities.      Labs: Lab Results  Component Value Date/Time   K 4.4 08/17/2017 03:46 PM   BUN 17 08/17/2017 03:46 PM   CREATININE 1.60 (H) 08/17/2017 03:46 PM   CREATININE 1.55 (H) 10/17/2014 10:08 AM   ALT 75 (H) 12/29/2011 05:55 AM   HGB 14.9 01/19/2012 06:00 AM     Lipids: Lab Results  Component Value Date/Time   LDLCALC 114 (H) 12/16/2011 04:30 AM   CHOL 184 12/16/2011 04:30 AM   TRIG 51 12/16/2011 04:30 AM   HDL 60 12/16/2011 04:30 AM       ASSESSMENT AND PLAN: 1.  Permanent atrial fibrillation: Symptomatically stable. Remains off of AV nodal blocking agents altogether with history of bradycardia. Maintained on Xarelto for anticoagulation.   2. Essential HTN with hypertensive heart disease:  BP is mildly elevated.  No changes for now.  3. Abnormal nuclear stress test/presumed coronary artery disease with prior MI: Symptomatically stable.  Stress test was low risk.  Continue atorvastatin.  No aspirin as he is on Xarelto.  4. Valvular heart disease: Symptomatically stable.  I will monitor clinically and with surveillance echocardiography.  5.  Hyperlipidemia: Lipids from March 2020 reviewed above.  LDL at goal.  Continue atorvastatin.    Disposition: Follow up 1 year  Kate Sable, M.D., F.A.C.C.

## 2019-03-10 DIAGNOSIS — G894 Chronic pain syndrome: Secondary | ICD-10-CM | POA: Diagnosis not present

## 2019-03-10 DIAGNOSIS — Z6834 Body mass index (BMI) 34.0-34.9, adult: Secondary | ICD-10-CM | POA: Diagnosis not present

## 2019-03-10 DIAGNOSIS — E6609 Other obesity due to excess calories: Secondary | ICD-10-CM | POA: Diagnosis not present

## 2019-03-10 DIAGNOSIS — Z0001 Encounter for general adult medical examination with abnormal findings: Secondary | ICD-10-CM | POA: Diagnosis not present

## 2019-03-10 DIAGNOSIS — E7849 Other hyperlipidemia: Secondary | ICD-10-CM | POA: Diagnosis not present

## 2019-03-10 DIAGNOSIS — I10 Essential (primary) hypertension: Secondary | ICD-10-CM | POA: Diagnosis not present

## 2019-03-10 DIAGNOSIS — E1129 Type 2 diabetes mellitus with other diabetic kidney complication: Secondary | ICD-10-CM | POA: Diagnosis not present

## 2019-03-10 DIAGNOSIS — Z1389 Encounter for screening for other disorder: Secondary | ICD-10-CM | POA: Diagnosis not present

## 2019-04-08 DIAGNOSIS — E6609 Other obesity due to excess calories: Secondary | ICD-10-CM | POA: Diagnosis not present

## 2019-04-08 DIAGNOSIS — Z6834 Body mass index (BMI) 34.0-34.9, adult: Secondary | ICD-10-CM | POA: Diagnosis not present

## 2019-04-08 DIAGNOSIS — I10 Essential (primary) hypertension: Secondary | ICD-10-CM | POA: Diagnosis not present

## 2019-04-08 DIAGNOSIS — E1129 Type 2 diabetes mellitus with other diabetic kidney complication: Secondary | ICD-10-CM | POA: Diagnosis not present

## 2019-04-10 DIAGNOSIS — E211 Secondary hyperparathyroidism, not elsewhere classified: Secondary | ICD-10-CM | POA: Diagnosis not present

## 2019-04-10 DIAGNOSIS — E1129 Type 2 diabetes mellitus with other diabetic kidney complication: Secondary | ICD-10-CM | POA: Diagnosis not present

## 2019-04-10 DIAGNOSIS — R809 Proteinuria, unspecified: Secondary | ICD-10-CM | POA: Diagnosis not present

## 2019-04-10 DIAGNOSIS — E1122 Type 2 diabetes mellitus with diabetic chronic kidney disease: Secondary | ICD-10-CM | POA: Diagnosis not present

## 2019-04-10 DIAGNOSIS — E559 Vitamin D deficiency, unspecified: Secondary | ICD-10-CM | POA: Diagnosis not present

## 2019-04-10 DIAGNOSIS — I129 Hypertensive chronic kidney disease with stage 1 through stage 4 chronic kidney disease, or unspecified chronic kidney disease: Secondary | ICD-10-CM | POA: Diagnosis not present

## 2019-04-10 DIAGNOSIS — N189 Chronic kidney disease, unspecified: Secondary | ICD-10-CM | POA: Diagnosis not present

## 2019-04-17 DIAGNOSIS — M79676 Pain in unspecified toe(s): Secondary | ICD-10-CM | POA: Diagnosis not present

## 2019-04-17 DIAGNOSIS — B351 Tinea unguium: Secondary | ICD-10-CM | POA: Diagnosis not present

## 2019-04-17 DIAGNOSIS — E1142 Type 2 diabetes mellitus with diabetic polyneuropathy: Secondary | ICD-10-CM | POA: Diagnosis not present

## 2019-04-17 DIAGNOSIS — L84 Corns and callosities: Secondary | ICD-10-CM | POA: Diagnosis not present

## 2019-04-18 DIAGNOSIS — E1122 Type 2 diabetes mellitus with diabetic chronic kidney disease: Secondary | ICD-10-CM | POA: Diagnosis not present

## 2019-04-18 DIAGNOSIS — E211 Secondary hyperparathyroidism, not elsewhere classified: Secondary | ICD-10-CM | POA: Diagnosis not present

## 2019-04-18 DIAGNOSIS — Z79899 Other long term (current) drug therapy: Secondary | ICD-10-CM | POA: Diagnosis not present

## 2019-04-18 DIAGNOSIS — N189 Chronic kidney disease, unspecified: Secondary | ICD-10-CM | POA: Diagnosis not present

## 2019-04-18 DIAGNOSIS — I129 Hypertensive chronic kidney disease with stage 1 through stage 4 chronic kidney disease, or unspecified chronic kidney disease: Secondary | ICD-10-CM | POA: Diagnosis not present

## 2019-04-18 DIAGNOSIS — R809 Proteinuria, unspecified: Secondary | ICD-10-CM | POA: Diagnosis not present

## 2019-04-18 DIAGNOSIS — E1129 Type 2 diabetes mellitus with other diabetic kidney complication: Secondary | ICD-10-CM | POA: Diagnosis not present

## 2019-04-18 DIAGNOSIS — E559 Vitamin D deficiency, unspecified: Secondary | ICD-10-CM | POA: Diagnosis not present

## 2019-05-29 ENCOUNTER — Other Ambulatory Visit: Payer: Self-pay | Admitting: Cardiovascular Disease

## 2019-05-29 MED ORDER — ATORVASTATIN CALCIUM 40 MG PO TABS: 40.0000 mg | ORAL_TABLET | Freq: Every day | ORAL | 1 refills | Status: DC

## 2019-05-29 NOTE — Telephone Encounter (Signed)
*  STAT* If patient is at the pharmacy, call can be transferred to refill team.   1. Which medications need to be refilled?  atorvastatin (LIPITOR) 40 MG tablet   2. Which pharmacy/location (including street and city if local pharmacy) is medication to be sent to? [place]  3. Do they need a 30 day or 90 day supply? [place]

## 2019-06-10 DIAGNOSIS — Z6834 Body mass index (BMI) 34.0-34.9, adult: Secondary | ICD-10-CM | POA: Diagnosis not present

## 2019-06-10 DIAGNOSIS — E6609 Other obesity due to excess calories: Secondary | ICD-10-CM | POA: Diagnosis not present

## 2019-06-10 DIAGNOSIS — I10 Essential (primary) hypertension: Secondary | ICD-10-CM | POA: Diagnosis not present

## 2019-06-10 DIAGNOSIS — G894 Chronic pain syndrome: Secondary | ICD-10-CM | POA: Diagnosis not present

## 2019-06-10 DIAGNOSIS — I4891 Unspecified atrial fibrillation: Secondary | ICD-10-CM | POA: Diagnosis not present

## 2019-06-10 DIAGNOSIS — E1129 Type 2 diabetes mellitus with other diabetic kidney complication: Secondary | ICD-10-CM | POA: Diagnosis not present

## 2019-07-03 DIAGNOSIS — L84 Corns and callosities: Secondary | ICD-10-CM | POA: Diagnosis not present

## 2019-07-03 DIAGNOSIS — B351 Tinea unguium: Secondary | ICD-10-CM | POA: Diagnosis not present

## 2019-07-03 DIAGNOSIS — E1142 Type 2 diabetes mellitus with diabetic polyneuropathy: Secondary | ICD-10-CM | POA: Diagnosis not present

## 2019-07-03 DIAGNOSIS — M79676 Pain in unspecified toe(s): Secondary | ICD-10-CM | POA: Diagnosis not present

## 2019-07-04 DIAGNOSIS — I10 Essential (primary) hypertension: Secondary | ICD-10-CM | POA: Diagnosis not present

## 2019-07-04 DIAGNOSIS — I4891 Unspecified atrial fibrillation: Secondary | ICD-10-CM | POA: Diagnosis not present

## 2019-07-04 DIAGNOSIS — E119 Type 2 diabetes mellitus without complications: Secondary | ICD-10-CM | POA: Diagnosis not present

## 2019-07-04 DIAGNOSIS — G894 Chronic pain syndrome: Secondary | ICD-10-CM | POA: Diagnosis not present

## 2019-07-04 DIAGNOSIS — Z6834 Body mass index (BMI) 34.0-34.9, adult: Secondary | ICD-10-CM | POA: Diagnosis not present

## 2019-07-24 DIAGNOSIS — Z79899 Other long term (current) drug therapy: Secondary | ICD-10-CM | POA: Diagnosis not present

## 2019-07-24 DIAGNOSIS — N189 Chronic kidney disease, unspecified: Secondary | ICD-10-CM | POA: Diagnosis not present

## 2019-07-24 DIAGNOSIS — E559 Vitamin D deficiency, unspecified: Secondary | ICD-10-CM | POA: Diagnosis not present

## 2019-07-24 DIAGNOSIS — R809 Proteinuria, unspecified: Secondary | ICD-10-CM | POA: Diagnosis not present

## 2019-07-24 DIAGNOSIS — E1122 Type 2 diabetes mellitus with diabetic chronic kidney disease: Secondary | ICD-10-CM | POA: Diagnosis not present

## 2019-07-24 DIAGNOSIS — E1129 Type 2 diabetes mellitus with other diabetic kidney complication: Secondary | ICD-10-CM | POA: Diagnosis not present

## 2019-07-24 DIAGNOSIS — E211 Secondary hyperparathyroidism, not elsewhere classified: Secondary | ICD-10-CM | POA: Diagnosis not present

## 2019-07-24 DIAGNOSIS — I129 Hypertensive chronic kidney disease with stage 1 through stage 4 chronic kidney disease, or unspecified chronic kidney disease: Secondary | ICD-10-CM | POA: Diagnosis not present

## 2019-07-30 DIAGNOSIS — E1129 Type 2 diabetes mellitus with other diabetic kidney complication: Secondary | ICD-10-CM | POA: Diagnosis not present

## 2019-07-30 DIAGNOSIS — D638 Anemia in other chronic diseases classified elsewhere: Secondary | ICD-10-CM | POA: Diagnosis not present

## 2019-07-30 DIAGNOSIS — N189 Chronic kidney disease, unspecified: Secondary | ICD-10-CM | POA: Diagnosis not present

## 2019-07-30 DIAGNOSIS — E1122 Type 2 diabetes mellitus with diabetic chronic kidney disease: Secondary | ICD-10-CM | POA: Diagnosis not present

## 2019-07-30 DIAGNOSIS — R809 Proteinuria, unspecified: Secondary | ICD-10-CM | POA: Diagnosis not present

## 2019-07-30 DIAGNOSIS — E211 Secondary hyperparathyroidism, not elsewhere classified: Secondary | ICD-10-CM | POA: Diagnosis not present

## 2019-07-30 DIAGNOSIS — I129 Hypertensive chronic kidney disease with stage 1 through stage 4 chronic kidney disease, or unspecified chronic kidney disease: Secondary | ICD-10-CM | POA: Diagnosis not present

## 2019-07-30 DIAGNOSIS — E872 Acidosis: Secondary | ICD-10-CM | POA: Diagnosis not present

## 2019-09-03 DIAGNOSIS — E6609 Other obesity due to excess calories: Secondary | ICD-10-CM | POA: Diagnosis not present

## 2019-09-03 DIAGNOSIS — Z6834 Body mass index (BMI) 34.0-34.9, adult: Secondary | ICD-10-CM | POA: Diagnosis not present

## 2019-09-03 DIAGNOSIS — E1129 Type 2 diabetes mellitus with other diabetic kidney complication: Secondary | ICD-10-CM | POA: Diagnosis not present

## 2019-09-03 DIAGNOSIS — G894 Chronic pain syndrome: Secondary | ICD-10-CM | POA: Diagnosis not present

## 2019-09-09 DIAGNOSIS — L84 Corns and callosities: Secondary | ICD-10-CM | POA: Diagnosis not present

## 2019-09-09 DIAGNOSIS — E1142 Type 2 diabetes mellitus with diabetic polyneuropathy: Secondary | ICD-10-CM | POA: Diagnosis not present

## 2019-09-09 DIAGNOSIS — M79676 Pain in unspecified toe(s): Secondary | ICD-10-CM | POA: Diagnosis not present

## 2019-09-09 DIAGNOSIS — B351 Tinea unguium: Secondary | ICD-10-CM | POA: Diagnosis not present

## 2019-10-02 DIAGNOSIS — G894 Chronic pain syndrome: Secondary | ICD-10-CM | POA: Diagnosis not present

## 2019-10-02 DIAGNOSIS — E6609 Other obesity due to excess calories: Secondary | ICD-10-CM | POA: Diagnosis not present

## 2019-10-02 DIAGNOSIS — Z6833 Body mass index (BMI) 33.0-33.9, adult: Secondary | ICD-10-CM | POA: Diagnosis not present

## 2019-10-22 DIAGNOSIS — E211 Secondary hyperparathyroidism, not elsewhere classified: Secondary | ICD-10-CM | POA: Diagnosis not present

## 2019-10-22 DIAGNOSIS — N189 Chronic kidney disease, unspecified: Secondary | ICD-10-CM | POA: Diagnosis not present

## 2019-10-22 DIAGNOSIS — E1129 Type 2 diabetes mellitus with other diabetic kidney complication: Secondary | ICD-10-CM | POA: Diagnosis not present

## 2019-10-22 DIAGNOSIS — D638 Anemia in other chronic diseases classified elsewhere: Secondary | ICD-10-CM | POA: Diagnosis not present

## 2019-10-22 DIAGNOSIS — I129 Hypertensive chronic kidney disease with stage 1 through stage 4 chronic kidney disease, or unspecified chronic kidney disease: Secondary | ICD-10-CM | POA: Diagnosis not present

## 2019-10-22 DIAGNOSIS — E1122 Type 2 diabetes mellitus with diabetic chronic kidney disease: Secondary | ICD-10-CM | POA: Diagnosis not present

## 2019-10-22 DIAGNOSIS — R809 Proteinuria, unspecified: Secondary | ICD-10-CM | POA: Diagnosis not present

## 2019-10-29 DIAGNOSIS — E1122 Type 2 diabetes mellitus with diabetic chronic kidney disease: Secondary | ICD-10-CM | POA: Diagnosis not present

## 2019-10-29 DIAGNOSIS — D638 Anemia in other chronic diseases classified elsewhere: Secondary | ICD-10-CM | POA: Diagnosis not present

## 2019-10-29 DIAGNOSIS — R809 Proteinuria, unspecified: Secondary | ICD-10-CM | POA: Diagnosis not present

## 2019-10-29 DIAGNOSIS — I129 Hypertensive chronic kidney disease with stage 1 through stage 4 chronic kidney disease, or unspecified chronic kidney disease: Secondary | ICD-10-CM | POA: Diagnosis not present

## 2019-10-29 DIAGNOSIS — E1129 Type 2 diabetes mellitus with other diabetic kidney complication: Secondary | ICD-10-CM | POA: Diagnosis not present

## 2019-10-29 DIAGNOSIS — N189 Chronic kidney disease, unspecified: Secondary | ICD-10-CM | POA: Diagnosis not present

## 2019-10-31 DIAGNOSIS — E1129 Type 2 diabetes mellitus with other diabetic kidney complication: Secondary | ICD-10-CM | POA: Diagnosis not present

## 2019-10-31 DIAGNOSIS — I4891 Unspecified atrial fibrillation: Secondary | ICD-10-CM | POA: Diagnosis not present

## 2019-10-31 DIAGNOSIS — G894 Chronic pain syndrome: Secondary | ICD-10-CM | POA: Diagnosis not present

## 2019-10-31 DIAGNOSIS — Z23 Encounter for immunization: Secondary | ICD-10-CM | POA: Diagnosis not present

## 2019-10-31 DIAGNOSIS — I10 Essential (primary) hypertension: Secondary | ICD-10-CM | POA: Diagnosis not present

## 2019-10-31 DIAGNOSIS — Z6833 Body mass index (BMI) 33.0-33.9, adult: Secondary | ICD-10-CM | POA: Diagnosis not present

## 2019-12-02 DIAGNOSIS — E6609 Other obesity due to excess calories: Secondary | ICD-10-CM | POA: Diagnosis not present

## 2019-12-02 DIAGNOSIS — G894 Chronic pain syndrome: Secondary | ICD-10-CM | POA: Diagnosis not present

## 2019-12-02 DIAGNOSIS — Z6833 Body mass index (BMI) 33.0-33.9, adult: Secondary | ICD-10-CM | POA: Diagnosis not present

## 2019-12-04 DIAGNOSIS — L84 Corns and callosities: Secondary | ICD-10-CM | POA: Diagnosis not present

## 2019-12-04 DIAGNOSIS — M79676 Pain in unspecified toe(s): Secondary | ICD-10-CM | POA: Diagnosis not present

## 2019-12-04 DIAGNOSIS — B351 Tinea unguium: Secondary | ICD-10-CM | POA: Diagnosis not present

## 2019-12-04 DIAGNOSIS — E1142 Type 2 diabetes mellitus with diabetic polyneuropathy: Secondary | ICD-10-CM | POA: Diagnosis not present

## 2019-12-10 ENCOUNTER — Other Ambulatory Visit: Payer: Self-pay | Admitting: Cardiology

## 2019-12-14 NOTE — Progress Notes (Signed)
Cardiology Office Note  Date: 12/15/2019   ID: Brendan Scott, DOB [birth year], MRN [id number]  PCP:  Sharilyn Sites, MD  Cardiologist:  No primary care provider on file. Electrophysiologist:  None   Chief Complaint: Follow-up pulmonary atrial fibrillation  History of Present Illness: Brendan Scott is a 74 y.o. male with a history of atrial fibrillation, aortic atherosclerosis, CKD, CAD, HTN, DM 2, CVA, HLD, COPD / Emphysema  Last encounter with Dr. Bronson Ing 12/13/2018.  Previous nuclear stress test August 2018 with evidence of prior small apical MI with mild peri-infarct ischemia.  Low risk study.  Echo 08/23/2017 EF 60 to 65%, moderate LVH, mild to moderate MR, moderate to severe biatrial dilatation.  Mild TR.  His atrial fibrillation was symptomatically stable.  He was not on AV nodal blockers due to history of bradycardia.  He was continuing on Xarelto for anticoagulation.  Blood pressure was mildly elevated but no changes.  History of abnormal stress test with presumed CAD with prior MI.  Symptomatically stable.  Stress test was low risk.  Continuing atorvastatin and Xarelto.  Valvular disease was symptomatically stable.  He was continuing atorvastatin with LDL at goal.  He is here for 1 year follow-up today.  He denies any recent acute illnesses or hospitalizations.  He is following with Dr. Theador Hawthorne for renal disease and evidence of recent anemia without any bleeding.  He denies any anginal or exertional symptoms, palpitations or arrhythmias.  He does have chronic dyspnea which he states is normal for him.  He denies any orthostatic symptoms, CVA or TIA-like symptoms, PND, orthopnea, bleeding, DVT or PE-like symptoms, lower extremity edema or claudication.  He remains in atrial fibrillation with a rate of 58.  History of mild to moderate MR on echo in 2019.  Past Medical History:  Diagnosis Date  . Aortic atherosclerosis ([place])   . Arthritis   . CKD (chronic kidney disease), stage III  ([place])   . Coronary artery calcification   . Diabetes mellitus   . HTN (hypertension)   . Hyperlipidemia   . Permanent atrial fibrillation ([place])   . Seizure disorder ([place])   . Stroke [facility])     Past Surgical History:  Procedure Laterality Date  . TOTAL KNEE ARTHROPLASTY     Bilateral    Current Outpatient Medications  Medication Sig Dispense Refill  . amLODipine (NORVASC) 10 MG tablet Take 10 mg by mouth daily.    Marland Kitchen atorvastatin (LIPITOR) 40 MG tablet TAKE 1 TABLET BY MOUTH ONCE A DAY. 90 tablet 1  . chlorthalidone (HYGROTON) 25 MG tablet Take 12.5 mg by mouth daily.    Marland Kitchen glimepiride (AMARYL) 2 MG tablet Take 4 mg by mouth daily with breakfast.    . HYDROcodone-acetaminophen (NORCO/VICODIN) 5-325 MG per tablet Take 1 tablet by mouth every 6 (six) hours as needed for moderate pain.    Marland Kitchen losartan (COZAAR) 100 MG tablet Take 1 tablet by mouth daily.    . Rivaroxaban (XARELTO) 20 MG TABS tablet Take 20 mg by mouth daily.    Marland Kitchen terazosin (HYTRIN) 2 MG capsule Take 2 mg by mouth at bedtime.     No current facility-administered medications for this visit.   Allergies:  Patient has no known allergies.   Social History: The patient  reports that he quit smoking about 41 years ago. He started smoking about 56 years ago. He has never used smokeless tobacco. He reports that he does not drink alcohol and does not use drugs.  Family History: The patient's family history includes Arthritis in an other family member; Diabetes in an other family member.   ROS:  Please see the history of present illness. Otherwise, complete review of systems is positive for none.  All other systems are reviewed and negative.   Physical Exam: VS:  BP 116/60   Pulse 72   Ht 5\' 5"  (1.651 m)   Wt 214 lb 12.8 oz (97.4 kg)   SpO2 98%   BMI 35.74 kg/m , BMI Body mass index is 35.74 kg/m.  Wt Readings from Last 3 Encounters:  12/15/19 214 lb 12.8 oz (97.4 kg)  12/13/18 222 lb (100.7 kg)  09/06/17 213 lb (96.6  kg)    General: Patient appears comfortable at rest. Neck: Supple, no elevated JVP or carotid bruits, no thyromegaly. Lungs: Clear to auscultation, nonlabored breathing at rest. Cardiac: Irregularly irregular rate and rhythm, no S3 or significant systolic murmur, no pericardial rub. Extremities: No pitting edema, distal pulses 2+. Skin: Warm and dry. Musculoskeletal: No kyphosis. Neuropsychiatric: Alert and oriented x3, affect grossly appropriate.  ECG:  An ECG dated 12/15/2019 was personally reviewed today and demonstrated:  Atrial fibrillation with slow ventricular response left anterior fascicular block rate of 58.  Recent Labwork: No results found for requested labs within last 8760 hours.     Component Value Date/Time   CHOL 184 12/16/2011 0430   TRIG 51 12/16/2011 0430   HDL 60 12/16/2011 0430   CHOLHDL 3.1 12/16/2011 0430   VLDL 10 12/16/2011 0430   LDLCALC 114 (H) 12/16/2011 0430    Other Studies Reviewed Today:  NST 09/03/2017 Study Result Narrative & Impression   There was no ST segment deviation noted during stress.  Findings consistent with prior small apical myocardial infarction with mild peri-infarct ischemia.  This is a low risk study.  The left ventricular ejection fraction is normal (55-65%).     Echocardiogram 08/23/2017 Study Conclusions   - Left ventricle: The cavity size was normal. Wall thickness was  increased in a pattern of moderate LVH. Systolic function was  normal. The estimated ejection fraction was in the range of 60%  to 65%. Wall motion was normal; there were no regional wall  motion abnormalities. The study was not technically sufficient to  allow evaluation of LV diastolic dysfunction due to atrial  fibrillation.  - Aortic valve: Mildly calcified annulus. Trileaflet.  - Mitral valve: Mildly calcified annulus. Mildly thickened  leaflets. There was mild to moderate regurgitation.  - Left atrium: The atrium was  moderately to severely dilated.  - Right atrium: The atrium was moderately to severely dilated.  Central venous pressure (est): 3 mm Hg.  - Atrial septum: No defect or patent foramen ovale was identified.  - Tricuspid valve: There was mild regurgitation.  - Pulmonary arteries: Systolic pressure could not be accurately  estimated.  - Pericardium, extracardiac: There was no pericardial effusion.   Assessment and Plan:  1. Permanent atrial fibrillation ([place])   2. Essential hypertension   3. Valvular heart disease   4. Mixed hyperlipidemia    1. Permanent atrial fibrillation (HCC) Atrial fibrillation rate is controlled today with a rate of 58 on EKG.  Continue Xarelto 20 mg p.o. daily.  History of bradycardia not on AV nodal blockers.  2. Essential hypertension Blood pressure well controlled today.  Continue amlodipine 10 mg daily, losartan 100 mg daily,  3. Valvular heart disease Last echo cardiogram 2019 demonstrated mild to moderate MR.  Please repeat echocardiogram to  reassess valve function.  Patient states he has shortness of breath which is chronic.  He denies any worsening of the shortness of breath.  4. Mixed hyperlipidemia Continue atorvastatin 40 mg daily.Last lipid panel from PCP showed total cholesterol 125, triglycerides 51, HDL 54, LDL 61  Medication Adjustments/Labs and Tests Ordered: Current medicines are reviewed at length with the patient today.  Concerns regarding medicines are outlined above.   Disposition: Follow-up with Dr. Harl Bowie or APP 1 year  Signed, Levell July, NP 12/15/2019 9:29 AM    [facility] [place], [place], [place] [address] Phone: [phone]; Fax: [phone]

## 2019-12-15 ENCOUNTER — Encounter: Payer: Self-pay | Admitting: Family Medicine

## 2019-12-15 ENCOUNTER — Ambulatory Visit (INDEPENDENT_AMBULATORY_CARE_PROVIDER_SITE_OTHER): Payer: Medicare PPO | Admitting: Family Medicine

## 2019-12-15 VITALS — BP 116/60 | HR 72 | Ht 65.0 in | Wt 214.8 lb

## 2019-12-15 DIAGNOSIS — I340 Nonrheumatic mitral (valve) insufficiency: Secondary | ICD-10-CM | POA: Diagnosis not present

## 2019-12-15 DIAGNOSIS — I38 Endocarditis, valve unspecified: Secondary | ICD-10-CM

## 2019-12-15 DIAGNOSIS — I4821 Permanent atrial fibrillation: Secondary | ICD-10-CM

## 2019-12-15 DIAGNOSIS — E782 Mixed hyperlipidemia: Secondary | ICD-10-CM | POA: Diagnosis not present

## 2019-12-15 DIAGNOSIS — I10 Essential (primary) hypertension: Secondary | ICD-10-CM

## 2019-12-15 NOTE — Patient Instructions (Addendum)

## 2019-12-29 DIAGNOSIS — R809 Proteinuria, unspecified: Secondary | ICD-10-CM | POA: Diagnosis not present

## 2019-12-29 DIAGNOSIS — E1122 Type 2 diabetes mellitus with diabetic chronic kidney disease: Secondary | ICD-10-CM | POA: Diagnosis not present

## 2019-12-29 DIAGNOSIS — I129 Hypertensive chronic kidney disease with stage 1 through stage 4 chronic kidney disease, or unspecified chronic kidney disease: Secondary | ICD-10-CM | POA: Diagnosis not present

## 2019-12-29 DIAGNOSIS — N189 Chronic kidney disease, unspecified: Secondary | ICD-10-CM | POA: Diagnosis not present

## 2019-12-29 DIAGNOSIS — E1129 Type 2 diabetes mellitus with other diabetic kidney complication: Secondary | ICD-10-CM | POA: Diagnosis not present

## 2019-12-29 DIAGNOSIS — D638 Anemia in other chronic diseases classified elsewhere: Secondary | ICD-10-CM | POA: Diagnosis not present

## 2020-01-01 DIAGNOSIS — E119 Type 2 diabetes mellitus without complications: Secondary | ICD-10-CM | POA: Diagnosis not present

## 2020-01-01 DIAGNOSIS — G894 Chronic pain syndrome: Secondary | ICD-10-CM | POA: Diagnosis not present

## 2020-01-01 DIAGNOSIS — Z6833 Body mass index (BMI) 33.0-33.9, adult: Secondary | ICD-10-CM | POA: Diagnosis not present

## 2020-01-01 DIAGNOSIS — E1129 Type 2 diabetes mellitus with other diabetic kidney complication: Secondary | ICD-10-CM | POA: Diagnosis not present

## 2020-01-02 DIAGNOSIS — E1122 Type 2 diabetes mellitus with diabetic chronic kidney disease: Secondary | ICD-10-CM | POA: Diagnosis not present

## 2020-01-02 DIAGNOSIS — I129 Hypertensive chronic kidney disease with stage 1 through stage 4 chronic kidney disease, or unspecified chronic kidney disease: Secondary | ICD-10-CM | POA: Diagnosis not present

## 2020-01-02 DIAGNOSIS — R809 Proteinuria, unspecified: Secondary | ICD-10-CM | POA: Diagnosis not present

## 2020-01-02 DIAGNOSIS — E1129 Type 2 diabetes mellitus with other diabetic kidney complication: Secondary | ICD-10-CM | POA: Diagnosis not present

## 2020-01-02 DIAGNOSIS — D638 Anemia in other chronic diseases classified elsewhere: Secondary | ICD-10-CM | POA: Diagnosis not present

## 2020-01-02 DIAGNOSIS — N189 Chronic kidney disease, unspecified: Secondary | ICD-10-CM | POA: Diagnosis not present

## 2020-01-08 ENCOUNTER — Ambulatory Visit (INDEPENDENT_AMBULATORY_CARE_PROVIDER_SITE_OTHER): Payer: Medicare PPO

## 2020-01-08 DIAGNOSIS — I340 Nonrheumatic mitral (valve) insufficiency: Secondary | ICD-10-CM

## 2020-01-08 LAB — ECHOCARDIOGRAM COMPLETE
Calc EF: 73.4 %
Single Plane A2C EF: 67 %
Single Plane A4C EF: 77.8 %

## 2020-01-09 ENCOUNTER — Telehealth: Payer: Self-pay | Admitting: Family Medicine

## 2020-01-09 NOTE — Telephone Encounter (Signed)
-----   Message from Verta Ellen., NP sent at 01/09/2020 12:59 PM EST ----- I called the patient but there was no answer to discuss his echocardiogram.  Basically the echocardiogram is unchanged from 2019 echo he had prior.  His pumping function is good.  Both of his upper chambers are severely dilated as they were back in 2019.  He has some mitral valve leakage which was present on his previous echo as well as some tricuspid valve leakage.  The last bullet point #8 mentions possible restrictive cardiomyopathy.  Basically it means his heart has problems with relaxing when it is trying fill with blood and getting ready to pump the blood back out of the heart during the next contraction.  There can be multiple reasons for this, 1 of which could be amyloidosis.  This means there are abnormal proteins in the heart that restrict its motion.  In that case there would need to be a study done that requires IV contrast.  It is called a PYP scan.  Given his renal disease this may affect his kidneys.  Basically this was present on the last echocardiogram.  There was just no mention of possible "restrictive cardiomyopathy".  Amyloidosis is not the only reason this could occur.  If he has diastolic dysfunction or (abnormal relaxation) this can also cause similar issues.  Usually when patients have diastolic dysfunction it is graded from grade 1/grade 2/grade 3 meaning the higher the grade the worse the abnormal relaxation.  In this study the sonographer was unable to determine what the diastolic parameters were, hence there was no grading of diastolic dysfunction.

## 2020-01-09 NOTE — Telephone Encounter (Signed)
Patient calling for echo results - they were released in Mychart but he doesn't understand them. Please call

## 2020-01-09 NOTE — Telephone Encounter (Signed)
Called the patient's phone listed.  Got a voicemail and left a voicemail to call back.  I also placed the result note which should be somewhat easy to follow so you can explain it to him.  If he has any questions after that I will call him back.  Thanks

## 2020-01-12 NOTE — Telephone Encounter (Signed)
Patient informed. Copy sent to PCP °

## 2020-02-02 DIAGNOSIS — G894 Chronic pain syndrome: Secondary | ICD-10-CM | POA: Diagnosis not present

## 2020-02-02 DIAGNOSIS — Z6833 Body mass index (BMI) 33.0-33.9, adult: Secondary | ICD-10-CM | POA: Diagnosis not present

## 2020-02-02 DIAGNOSIS — E6609 Other obesity due to excess calories: Secondary | ICD-10-CM | POA: Diagnosis not present

## 2020-02-02 DIAGNOSIS — M1991 Primary osteoarthritis, unspecified site: Secondary | ICD-10-CM | POA: Diagnosis not present

## 2020-02-13 DIAGNOSIS — I129 Hypertensive chronic kidney disease with stage 1 through stage 4 chronic kidney disease, or unspecified chronic kidney disease: Secondary | ICD-10-CM | POA: Diagnosis not present

## 2020-02-13 DIAGNOSIS — E1122 Type 2 diabetes mellitus with diabetic chronic kidney disease: Secondary | ICD-10-CM | POA: Diagnosis not present

## 2020-02-13 DIAGNOSIS — D638 Anemia in other chronic diseases classified elsewhere: Secondary | ICD-10-CM | POA: Diagnosis not present

## 2020-02-13 DIAGNOSIS — N189 Chronic kidney disease, unspecified: Secondary | ICD-10-CM | POA: Diagnosis not present

## 2020-03-01 DIAGNOSIS — M1991 Primary osteoarthritis, unspecified site: Secondary | ICD-10-CM | POA: Diagnosis not present

## 2020-03-01 DIAGNOSIS — M1712 Unilateral primary osteoarthritis, left knee: Secondary | ICD-10-CM | POA: Diagnosis not present

## 2020-03-01 DIAGNOSIS — G894 Chronic pain syndrome: Secondary | ICD-10-CM | POA: Diagnosis not present

## 2020-03-01 DIAGNOSIS — Z6834 Body mass index (BMI) 34.0-34.9, adult: Secondary | ICD-10-CM | POA: Diagnosis not present

## 2020-03-30 DIAGNOSIS — L84 Corns and callosities: Secondary | ICD-10-CM | POA: Diagnosis not present

## 2020-03-30 DIAGNOSIS — B351 Tinea unguium: Secondary | ICD-10-CM | POA: Diagnosis not present

## 2020-03-30 DIAGNOSIS — M79676 Pain in unspecified toe(s): Secondary | ICD-10-CM | POA: Diagnosis not present

## 2020-03-30 DIAGNOSIS — E1142 Type 2 diabetes mellitus with diabetic polyneuropathy: Secondary | ICD-10-CM | POA: Diagnosis not present

## 2020-04-01 DIAGNOSIS — Z0000 Encounter for general adult medical examination without abnormal findings: Secondary | ICD-10-CM | POA: Diagnosis not present

## 2020-04-01 DIAGNOSIS — E1129 Type 2 diabetes mellitus with other diabetic kidney complication: Secondary | ICD-10-CM | POA: Diagnosis not present

## 2020-04-01 DIAGNOSIS — E7849 Other hyperlipidemia: Secondary | ICD-10-CM | POA: Diagnosis not present

## 2020-04-01 DIAGNOSIS — M1712 Unilateral primary osteoarthritis, left knee: Secondary | ICD-10-CM | POA: Diagnosis not present

## 2020-04-01 DIAGNOSIS — D329 Benign neoplasm of meninges, unspecified: Secondary | ICD-10-CM | POA: Diagnosis not present

## 2020-04-01 DIAGNOSIS — Z1331 Encounter for screening for depression: Secondary | ICD-10-CM | POA: Diagnosis not present

## 2020-04-01 DIAGNOSIS — E119 Type 2 diabetes mellitus without complications: Secondary | ICD-10-CM | POA: Diagnosis not present

## 2020-04-01 DIAGNOSIS — Z23 Encounter for immunization: Secondary | ICD-10-CM | POA: Diagnosis not present

## 2020-04-01 DIAGNOSIS — Z0001 Encounter for general adult medical examination with abnormal findings: Secondary | ICD-10-CM | POA: Diagnosis not present

## 2020-04-01 DIAGNOSIS — Z1389 Encounter for screening for other disorder: Secondary | ICD-10-CM | POA: Diagnosis not present

## 2020-04-01 DIAGNOSIS — G894 Chronic pain syndrome: Secondary | ICD-10-CM | POA: Diagnosis not present

## 2020-04-01 DIAGNOSIS — Z6834 Body mass index (BMI) 34.0-34.9, adult: Secondary | ICD-10-CM | POA: Diagnosis not present

## 2020-04-08 DIAGNOSIS — N189 Chronic kidney disease, unspecified: Secondary | ICD-10-CM | POA: Diagnosis not present

## 2020-04-08 DIAGNOSIS — D638 Anemia in other chronic diseases classified elsewhere: Secondary | ICD-10-CM | POA: Diagnosis not present

## 2020-04-08 DIAGNOSIS — E1122 Type 2 diabetes mellitus with diabetic chronic kidney disease: Secondary | ICD-10-CM | POA: Diagnosis not present

## 2020-04-08 DIAGNOSIS — I129 Hypertensive chronic kidney disease with stage 1 through stage 4 chronic kidney disease, or unspecified chronic kidney disease: Secondary | ICD-10-CM | POA: Diagnosis not present

## 2020-04-16 DIAGNOSIS — R809 Proteinuria, unspecified: Secondary | ICD-10-CM | POA: Diagnosis not present

## 2020-04-16 DIAGNOSIS — E1129 Type 2 diabetes mellitus with other diabetic kidney complication: Secondary | ICD-10-CM | POA: Diagnosis not present

## 2020-04-16 DIAGNOSIS — N189 Chronic kidney disease, unspecified: Secondary | ICD-10-CM | POA: Diagnosis not present

## 2020-04-16 DIAGNOSIS — E872 Acidosis: Secondary | ICD-10-CM | POA: Diagnosis not present

## 2020-04-16 DIAGNOSIS — D638 Anemia in other chronic diseases classified elsewhere: Secondary | ICD-10-CM | POA: Diagnosis not present

## 2020-04-16 DIAGNOSIS — I129 Hypertensive chronic kidney disease with stage 1 through stage 4 chronic kidney disease, or unspecified chronic kidney disease: Secondary | ICD-10-CM | POA: Diagnosis not present

## 2020-04-16 DIAGNOSIS — E1122 Type 2 diabetes mellitus with diabetic chronic kidney disease: Secondary | ICD-10-CM | POA: Diagnosis not present

## 2020-05-03 DIAGNOSIS — G8190 Hemiplegia, unspecified affecting unspecified side: Secondary | ICD-10-CM | POA: Diagnosis not present

## 2020-05-03 DIAGNOSIS — G894 Chronic pain syndrome: Secondary | ICD-10-CM | POA: Diagnosis not present

## 2020-05-03 DIAGNOSIS — M1712 Unilateral primary osteoarthritis, left knee: Secondary | ICD-10-CM | POA: Diagnosis not present

## 2020-05-03 DIAGNOSIS — E6609 Other obesity due to excess calories: Secondary | ICD-10-CM | POA: Diagnosis not present

## 2020-05-03 DIAGNOSIS — M1991 Primary osteoarthritis, unspecified site: Secondary | ICD-10-CM | POA: Diagnosis not present

## 2020-05-03 DIAGNOSIS — D329 Benign neoplasm of meninges, unspecified: Secondary | ICD-10-CM | POA: Diagnosis not present

## 2020-05-31 DIAGNOSIS — G894 Chronic pain syndrome: Secondary | ICD-10-CM | POA: Diagnosis not present

## 2020-05-31 DIAGNOSIS — M1712 Unilateral primary osteoarthritis, left knee: Secondary | ICD-10-CM | POA: Diagnosis not present

## 2020-05-31 DIAGNOSIS — Z6835 Body mass index (BMI) 35.0-35.9, adult: Secondary | ICD-10-CM | POA: Diagnosis not present

## 2020-05-31 DIAGNOSIS — E6609 Other obesity due to excess calories: Secondary | ICD-10-CM | POA: Diagnosis not present

## 2020-06-11 DIAGNOSIS — E1129 Type 2 diabetes mellitus with other diabetic kidney complication: Secondary | ICD-10-CM | POA: Diagnosis not present

## 2020-06-11 DIAGNOSIS — R809 Proteinuria, unspecified: Secondary | ICD-10-CM | POA: Diagnosis not present

## 2020-06-11 DIAGNOSIS — I129 Hypertensive chronic kidney disease with stage 1 through stage 4 chronic kidney disease, or unspecified chronic kidney disease: Secondary | ICD-10-CM | POA: Diagnosis not present

## 2020-06-11 DIAGNOSIS — E1122 Type 2 diabetes mellitus with diabetic chronic kidney disease: Secondary | ICD-10-CM | POA: Diagnosis not present

## 2020-06-11 DIAGNOSIS — N189 Chronic kidney disease, unspecified: Secondary | ICD-10-CM | POA: Diagnosis not present

## 2020-06-11 DIAGNOSIS — D638 Anemia in other chronic diseases classified elsewhere: Secondary | ICD-10-CM | POA: Diagnosis not present

## 2020-06-18 DIAGNOSIS — I129 Hypertensive chronic kidney disease with stage 1 through stage 4 chronic kidney disease, or unspecified chronic kidney disease: Secondary | ICD-10-CM | POA: Diagnosis not present

## 2020-06-18 DIAGNOSIS — D638 Anemia in other chronic diseases classified elsewhere: Secondary | ICD-10-CM | POA: Diagnosis not present

## 2020-06-18 DIAGNOSIS — N189 Chronic kidney disease, unspecified: Secondary | ICD-10-CM | POA: Diagnosis not present

## 2020-06-18 DIAGNOSIS — E872 Acidosis: Secondary | ICD-10-CM | POA: Diagnosis not present

## 2020-06-18 DIAGNOSIS — E1122 Type 2 diabetes mellitus with diabetic chronic kidney disease: Secondary | ICD-10-CM | POA: Diagnosis not present

## 2020-06-22 DIAGNOSIS — M79676 Pain in unspecified toe(s): Secondary | ICD-10-CM | POA: Diagnosis not present

## 2020-06-22 DIAGNOSIS — E1142 Type 2 diabetes mellitus with diabetic polyneuropathy: Secondary | ICD-10-CM | POA: Diagnosis not present

## 2020-06-22 DIAGNOSIS — L84 Corns and callosities: Secondary | ICD-10-CM | POA: Diagnosis not present

## 2020-06-22 DIAGNOSIS — B351 Tinea unguium: Secondary | ICD-10-CM | POA: Diagnosis not present

## 2020-07-05 DIAGNOSIS — Z6834 Body mass index (BMI) 34.0-34.9, adult: Secondary | ICD-10-CM | POA: Diagnosis not present

## 2020-07-05 DIAGNOSIS — G894 Chronic pain syndrome: Secondary | ICD-10-CM | POA: Diagnosis not present

## 2020-07-05 DIAGNOSIS — E6609 Other obesity due to excess calories: Secondary | ICD-10-CM | POA: Diagnosis not present

## 2020-07-05 DIAGNOSIS — M1712 Unilateral primary osteoarthritis, left knee: Secondary | ICD-10-CM | POA: Diagnosis not present

## 2020-08-04 DIAGNOSIS — Z6835 Body mass index (BMI) 35.0-35.9, adult: Secondary | ICD-10-CM | POA: Diagnosis not present

## 2020-08-04 DIAGNOSIS — E6609 Other obesity due to excess calories: Secondary | ICD-10-CM | POA: Diagnosis not present

## 2020-08-04 DIAGNOSIS — M1991 Primary osteoarthritis, unspecified site: Secondary | ICD-10-CM | POA: Diagnosis not present

## 2020-08-04 DIAGNOSIS — G894 Chronic pain syndrome: Secondary | ICD-10-CM | POA: Diagnosis not present

## 2020-08-04 DIAGNOSIS — M1712 Unilateral primary osteoarthritis, left knee: Secondary | ICD-10-CM | POA: Diagnosis not present

## 2020-08-10 ENCOUNTER — Other Ambulatory Visit: Payer: Self-pay | Admitting: Family Medicine

## 2020-08-13 DIAGNOSIS — E1122 Type 2 diabetes mellitus with diabetic chronic kidney disease: Secondary | ICD-10-CM | POA: Diagnosis not present

## 2020-08-13 DIAGNOSIS — E872 Acidosis: Secondary | ICD-10-CM | POA: Diagnosis not present

## 2020-08-13 DIAGNOSIS — D638 Anemia in other chronic diseases classified elsewhere: Secondary | ICD-10-CM | POA: Diagnosis not present

## 2020-08-13 DIAGNOSIS — N189 Chronic kidney disease, unspecified: Secondary | ICD-10-CM | POA: Diagnosis not present

## 2020-08-13 DIAGNOSIS — I129 Hypertensive chronic kidney disease with stage 1 through stage 4 chronic kidney disease, or unspecified chronic kidney disease: Secondary | ICD-10-CM | POA: Diagnosis not present

## 2020-08-20 DIAGNOSIS — E1129 Type 2 diabetes mellitus with other diabetic kidney complication: Secondary | ICD-10-CM | POA: Diagnosis not present

## 2020-08-20 DIAGNOSIS — E872 Acidosis: Secondary | ICD-10-CM | POA: Diagnosis not present

## 2020-08-20 DIAGNOSIS — R7303 Prediabetes: Secondary | ICD-10-CM | POA: Diagnosis not present

## 2020-08-20 DIAGNOSIS — R809 Proteinuria, unspecified: Secondary | ICD-10-CM | POA: Diagnosis not present

## 2020-08-20 DIAGNOSIS — E1122 Type 2 diabetes mellitus with diabetic chronic kidney disease: Secondary | ICD-10-CM | POA: Diagnosis not present

## 2020-08-20 DIAGNOSIS — D638 Anemia in other chronic diseases classified elsewhere: Secondary | ICD-10-CM | POA: Diagnosis not present

## 2020-08-20 DIAGNOSIS — N189 Chronic kidney disease, unspecified: Secondary | ICD-10-CM | POA: Diagnosis not present

## 2020-08-20 DIAGNOSIS — I129 Hypertensive chronic kidney disease with stage 1 through stage 4 chronic kidney disease, or unspecified chronic kidney disease: Secondary | ICD-10-CM | POA: Diagnosis not present

## 2020-08-31 DIAGNOSIS — L84 Corns and callosities: Secondary | ICD-10-CM | POA: Diagnosis not present

## 2020-08-31 DIAGNOSIS — E1142 Type 2 diabetes mellitus with diabetic polyneuropathy: Secondary | ICD-10-CM | POA: Diagnosis not present

## 2020-08-31 DIAGNOSIS — M79676 Pain in unspecified toe(s): Secondary | ICD-10-CM | POA: Diagnosis not present

## 2020-08-31 DIAGNOSIS — B351 Tinea unguium: Secondary | ICD-10-CM | POA: Diagnosis not present

## 2020-09-08 DIAGNOSIS — I4891 Unspecified atrial fibrillation: Secondary | ICD-10-CM | POA: Diagnosis not present

## 2020-09-08 DIAGNOSIS — Z23 Encounter for immunization: Secondary | ICD-10-CM | POA: Diagnosis not present

## 2020-09-08 DIAGNOSIS — I6930 Unspecified sequelae of cerebral infarction: Secondary | ICD-10-CM | POA: Diagnosis not present

## 2020-09-08 DIAGNOSIS — M1991 Primary osteoarthritis, unspecified site: Secondary | ICD-10-CM | POA: Diagnosis not present

## 2020-09-08 DIAGNOSIS — G894 Chronic pain syndrome: Secondary | ICD-10-CM | POA: Diagnosis not present

## 2020-09-08 DIAGNOSIS — I700 Atherosclerosis of aorta: Secondary | ICD-10-CM | POA: Diagnosis not present

## 2020-09-08 DIAGNOSIS — Z6834 Body mass index (BMI) 34.0-34.9, adult: Secondary | ICD-10-CM | POA: Diagnosis not present

## 2020-09-08 DIAGNOSIS — R319 Hematuria, unspecified: Secondary | ICD-10-CM | POA: Diagnosis not present

## 2020-09-08 DIAGNOSIS — E6609 Other obesity due to excess calories: Secondary | ICD-10-CM | POA: Diagnosis not present

## 2020-09-15 DIAGNOSIS — R319 Hematuria, unspecified: Secondary | ICD-10-CM | POA: Diagnosis not present

## 2020-09-20 ENCOUNTER — Other Ambulatory Visit (HOSPITAL_COMMUNITY): Payer: Self-pay | Admitting: Neurosurgery

## 2020-09-20 ENCOUNTER — Other Ambulatory Visit: Payer: Self-pay | Admitting: Neurosurgery

## 2020-09-20 DIAGNOSIS — D329 Benign neoplasm of meninges, unspecified: Secondary | ICD-10-CM

## 2020-09-29 ENCOUNTER — Other Ambulatory Visit: Payer: Self-pay

## 2020-09-29 ENCOUNTER — Ambulatory Visit (HOSPITAL_COMMUNITY)
Admission: RE | Admit: 2020-09-29 | Discharge: 2020-09-29 | Disposition: A | Discharge status: Home / Self Care | Payer: Medicare PPO | Source: Ambulatory Visit | Attending: Neurosurgery | Admitting: Neurosurgery

## 2020-09-29 DIAGNOSIS — D329 Benign neoplasm of meninges, unspecified: Secondary | ICD-10-CM | POA: Insufficient documentation

## 2020-09-29 DIAGNOSIS — G936 Cerebral edema: Secondary | ICD-10-CM | POA: Diagnosis not present

## 2020-09-29 DIAGNOSIS — G9389 Other specified disorders of brain: Secondary | ICD-10-CM | POA: Diagnosis not present

## 2020-09-29 MED ORDER — GADOBUTROL 1 MMOL/ML IV SOLN
9.5000 mL | Freq: Once | INTRAVENOUS | Status: AC | PRN
  Administered 2020-09-29: 9.5 mL via INTRAVENOUS

## 2020-10-07 DIAGNOSIS — Z6834 Body mass index (BMI) 34.0-34.9, adult: Secondary | ICD-10-CM | POA: Diagnosis not present

## 2020-10-07 DIAGNOSIS — N183 Chronic kidney disease, stage 3 unspecified: Secondary | ICD-10-CM | POA: Diagnosis not present

## 2020-10-07 DIAGNOSIS — G8190 Hemiplegia, unspecified affecting unspecified side: Secondary | ICD-10-CM | POA: Diagnosis not present

## 2020-10-07 DIAGNOSIS — I4891 Unspecified atrial fibrillation: Secondary | ICD-10-CM | POA: Diagnosis not present

## 2020-10-07 DIAGNOSIS — I10 Essential (primary) hypertension: Secondary | ICD-10-CM | POA: Diagnosis not present

## 2020-10-07 DIAGNOSIS — E1129 Type 2 diabetes mellitus with other diabetic kidney complication: Secondary | ICD-10-CM | POA: Diagnosis not present

## 2020-10-07 DIAGNOSIS — E6609 Other obesity due to excess calories: Secondary | ICD-10-CM | POA: Diagnosis not present

## 2020-10-07 DIAGNOSIS — M1991 Primary osteoarthritis, unspecified site: Secondary | ICD-10-CM | POA: Diagnosis not present

## 2020-10-07 DIAGNOSIS — E782 Mixed hyperlipidemia: Secondary | ICD-10-CM | POA: Diagnosis not present

## 2020-10-07 DIAGNOSIS — Z23 Encounter for immunization: Secondary | ICD-10-CM | POA: Diagnosis not present

## 2020-10-15 DIAGNOSIS — R062 Wheezing: Secondary | ICD-10-CM | POA: Diagnosis not present

## 2020-10-15 DIAGNOSIS — J302 Other seasonal allergic rhinitis: Secondary | ICD-10-CM | POA: Diagnosis not present

## 2020-10-15 DIAGNOSIS — E6609 Other obesity due to excess calories: Secondary | ICD-10-CM | POA: Diagnosis not present

## 2020-10-15 DIAGNOSIS — Z6835 Body mass index (BMI) 35.0-35.9, adult: Secondary | ICD-10-CM | POA: Diagnosis not present

## 2020-10-21 DIAGNOSIS — I129 Hypertensive chronic kidney disease with stage 1 through stage 4 chronic kidney disease, or unspecified chronic kidney disease: Secondary | ICD-10-CM | POA: Diagnosis not present

## 2020-10-21 DIAGNOSIS — E1122 Type 2 diabetes mellitus with diabetic chronic kidney disease: Secondary | ICD-10-CM | POA: Diagnosis not present

## 2020-10-21 DIAGNOSIS — E872 Acidosis: Secondary | ICD-10-CM | POA: Diagnosis not present

## 2020-10-21 DIAGNOSIS — N189 Chronic kidney disease, unspecified: Secondary | ICD-10-CM | POA: Diagnosis not present

## 2020-10-21 DIAGNOSIS — R809 Proteinuria, unspecified: Secondary | ICD-10-CM | POA: Diagnosis not present

## 2020-10-21 DIAGNOSIS — D638 Anemia in other chronic diseases classified elsewhere: Secondary | ICD-10-CM | POA: Diagnosis not present

## 2020-10-21 DIAGNOSIS — E1129 Type 2 diabetes mellitus with other diabetic kidney complication: Secondary | ICD-10-CM | POA: Diagnosis not present

## 2020-10-25 DIAGNOSIS — D329 Benign neoplasm of meninges, unspecified: Secondary | ICD-10-CM | POA: Diagnosis not present

## 2020-10-29 DIAGNOSIS — E1129 Type 2 diabetes mellitus with other diabetic kidney complication: Secondary | ICD-10-CM | POA: Diagnosis not present

## 2020-10-29 DIAGNOSIS — R809 Proteinuria, unspecified: Secondary | ICD-10-CM | POA: Diagnosis not present

## 2020-10-29 DIAGNOSIS — I129 Hypertensive chronic kidney disease with stage 1 through stage 4 chronic kidney disease, or unspecified chronic kidney disease: Secondary | ICD-10-CM | POA: Diagnosis not present

## 2020-10-29 DIAGNOSIS — D638 Anemia in other chronic diseases classified elsewhere: Secondary | ICD-10-CM | POA: Diagnosis not present

## 2020-10-29 DIAGNOSIS — E1122 Type 2 diabetes mellitus with diabetic chronic kidney disease: Secondary | ICD-10-CM | POA: Diagnosis not present

## 2020-10-29 DIAGNOSIS — R972 Elevated prostate specific antigen [PSA]: Secondary | ICD-10-CM | POA: Diagnosis not present

## 2020-10-29 DIAGNOSIS — N189 Chronic kidney disease, unspecified: Secondary | ICD-10-CM | POA: Diagnosis not present

## 2020-11-09 DIAGNOSIS — E6609 Other obesity due to excess calories: Secondary | ICD-10-CM | POA: Diagnosis not present

## 2020-11-09 DIAGNOSIS — G8190 Hemiplegia, unspecified affecting unspecified side: Secondary | ICD-10-CM | POA: Diagnosis not present

## 2020-11-09 DIAGNOSIS — Z6835 Body mass index (BMI) 35.0-35.9, adult: Secondary | ICD-10-CM | POA: Diagnosis not present

## 2020-11-09 DIAGNOSIS — F419 Anxiety disorder, unspecified: Secondary | ICD-10-CM | POA: Diagnosis not present

## 2020-11-09 DIAGNOSIS — G894 Chronic pain syndrome: Secondary | ICD-10-CM | POA: Diagnosis not present

## 2020-11-09 DIAGNOSIS — E119 Type 2 diabetes mellitus without complications: Secondary | ICD-10-CM | POA: Diagnosis not present

## 2020-11-09 DIAGNOSIS — I4891 Unspecified atrial fibrillation: Secondary | ICD-10-CM | POA: Diagnosis not present

## 2020-11-09 DIAGNOSIS — I10 Essential (primary) hypertension: Secondary | ICD-10-CM | POA: Diagnosis not present

## 2020-11-09 DIAGNOSIS — D329 Benign neoplasm of meninges, unspecified: Secondary | ICD-10-CM | POA: Diagnosis not present

## 2020-11-15 NOTE — Progress Notes (Signed)
Cardiology Office Note:    Date:  11/16/2020   ID:  Brendan Scott, DOB 04-Oct-1945, MRN [id number]  PCP:  Sharilyn Sites, MD   [facility] Providers Cardiologist:  Werner Lean, MD     Referring MD: Sharilyn Sites, MD   CC: "Time for me to see you" [place]  History of Present Illness:    Brendan Scott is a 75 y.o. male with a hx of PAF, CAD, Aortic atherosclerosis, HTN with DM; HLD with DM, COPD, CKD Stage IIIa who presents to re-establish care 11/06/20.  Patient notes that he is doing OK.  Last seen 12/2019. Patients lives a sedentary lifestyle.  Walks a quarter of a mile with no issues.  Mainly watches TV.  No chest pain or pressure.  No SOB/DOE and no PND/Orthopnea.  No weight gain or leg swelling.  No palpitations or syncope.  Does not remember the diagnosis of atrial fibrillation.  Never had tachycardia.    Ambulatory blood pressure 130/72.   Past Medical History:  Diagnosis Date   Aortic atherosclerosis (HCC)    Arthritis    CKD (chronic kidney disease), stage III (HCC)    Coronary artery calcification    Diabetes mellitus    HTN (hypertension)    Hyperlipidemia    Permanent atrial fibrillation (HCC)    Seizure disorder (HCC)    Stroke [facility])     Past Surgical History:  Procedure Laterality Date   TOTAL KNEE ARTHROPLASTY     Bilateral    Current Medications: Current Meds  Medication Sig   amLODipine (NORVASC) 10 MG tablet Take 10 mg by mouth daily.   atorvastatin (LIPITOR) 40 MG tablet TAKE 1 TABLET BY MOUTH ONCE A DAY.   chlorthalidone (HYGROTON) 25 MG tablet Take 12.5 mg by mouth daily.   glimepiride (AMARYL) 2 MG tablet Take 4 mg by mouth daily with breakfast.   HYDROcodone-acetaminophen (NORCO/VICODIN) 5-325 MG per tablet Take 1 tablet by mouth every 6 (six) hours as needed for moderate pain.   losartan (COZAAR) 100 MG tablet Take 1 tablet by mouth daily.   rivaroxaban (XARELTO) 20 MG TABS tablet Take 20 mg by mouth daily.   terazosin  (HYTRIN) 2 MG capsule Take 2 mg by mouth at bedtime.     Allergies:   Patient has no known allergies.   Social History   Socioeconomic History   Marital status: Married    Spouse name: Not on file   Number of children: Not on file   Years of education: Not on file   Highest education level: Not on file  Occupational History   Not on file  Tobacco Use   Smoking status: Former    Types: Cigarettes    Start date: 01/31/1963    Quit date: 01/30/1978    Years since quitting: 42.8   Smokeless tobacco: Never   Tobacco comments:    quit 30 years ago  Substance and Sexual Activity   Alcohol use: No    Alcohol/week: 0.0 standard drinks   Drug use: No   Sexual activity: Not on file  Other Topics Concern   Not on file  Social History Narrative   Not on file   Social Determinants of Health   Financial Resource Strain: Not on file  Food Insecurity: Not on file  Transportation Needs: Not on file  Physical Activity: Not on file  Stress: Not on file  Social Connections: Not on file    Social: from [place]  Family History: The  patient's family history includes Arthritis in an other family member; Diabetes in an other family member.  ROS:   Please see the history of present illness.    All other systems reviewed and are negative.  EKGs/Labs/Other Studies Reviewed:    The following studies were reviewed today:   EKG:  EKG is  ordered today.  The ekg ordered today demonstrates  11/16/20: AF SVR rate 57 with anterior infarct pattern  Transthoracic Echocardiogram: Date: 01/08/2020 Results: 1. Left ventricular ejection fraction, by estimation, is 60 to 65%. The  left ventricle has normal function. The left ventricle has no regional  wall motion abnormalities. The left ventricular internal cavity size was  mildly dilated. There is mild left  ventricular hypertrophy. Left ventricular diastolic parameters are  indeterminate.   2. Right ventricular systolic function is normal.  The right ventricular  size is normal.   3. Left atrial size was severely dilated.   4. Right atrial size was severely dilated.   5. The mitral valve is normal in structure. Moderate mitral valve  regurgitation.   6. Tricuspid valve regurgitation is moderate.   7. The aortic valve is tricuspid. Aortic valve regurgitation is not  visualized.   8. Severe biatrial enlargement with functional MR/TR. Consider  restrictive cardiomyopathy.   CT Aorta Date:08/10/2017 Results: IMPRESSION: 1. No pulmonary emboli. 2. Mild atherosclerotic changes in the thoracic aorta. Coronary artery calcifications. 3. Mild emphysematous changes in the lung apices. 4. No other acute abnormalities.  Recent Labs: No results found for requested labs within last 8760 hours.  Recent Lipid Panel    Component Value Date/Time   CHOL 184 12/16/2011 0430   TRIG 51 12/16/2011 0430   HDL 60 12/16/2011 0430   CHOLHDL 3.1 12/16/2011 0430   VLDL 10 12/16/2011 0430   LDLCALC 114 (H) 12/16/2011 0430   Outside Hospital  or Outside Clinic Studies (OSH):  Date: 10/07/20   Cholesterol 125 HDL 52 LDL 60 Tgs 47 TSH 2.6   Risk Assessment/Calculations:    CHA2DS2-VASc Score = 5   This indicates a 7.2% annual risk of stroke. The patient's score is based upon: CHF History: 0 HTN History: 1 Diabetes History: 1 Stroke History: 0 Vascular Disease History: 1 Age Score: 2 Gender Score: 0         Physical Exam:    VS:  BP 138/74   Pulse 82   Ht 5\' 5"  (1.651 m)   Wt 211 lb 9.6 oz (96 kg)   SpO2 100%   BMI 35.21 kg/m     Wt Readings from Last 3 Encounters:  11/16/20 211 lb 9.6 oz (96 kg)  12/15/19 214 lb 12.8 oz (97.4 kg)  12/13/18 222 lb (100.7 kg)     GEN: Obese male well developed in no acute distress HEENT: Normal NECK: No JVD LYMPHATICS: No lymphadenopathy CARDIAC: IRIR but with regular rhythm, no murmurs, rubs, gallops, distant heart sounds RESPIRATORY:  Clear to auscultation without rales,  wheezing or rhonchi  ABDOMEN: Soft, non-tender, non-distended MUSCULOSKELETAL:  No edema; No deformity  SKIN: Warm and dry NEUROLOGIC:  Alert and oriented x 3 PSYCHIATRIC:  Normal affect   ASSESSMENT:    1. Permanent atrial fibrillation ([place])   2. Nonrheumatic mitral valve regurgitation   3. Stage 3a chronic kidney disease ([place])   4. Hyperlipidemia associated with type 2 diabetes mellitus ([place])   5. Aortic atherosclerosis ([place])   6. Hypertension associated with diabetes ([place])    PLAN:    Permanent Atrial Fibrillation,  Mild to Moderate MR likely atrial functional CKD stage IIIa - CHADSVASC=5. - Continue anticoagulation with Xarelto.  - Continue rate control without AV nodal agent - Biatrial enlargement with AF  - We discussed the pros and cons of PYP scanning for evaluation of cardiac amyloidosis; including if positive what therapy would be (to decrease HF burden and costs of novel therapies); reviewed 2021 echo with patient - Patient feels well and can do everything he wants to do - will get echo in one year and if change in sx burden we will re-offer Pyrophophate scanning - BMP  CAC HLD with DM Aortic atherosclerosis - on Xarelto due to AF - LDL 61 atorvastatin  40 mg in 2021 - CBC  HTN with DM - on norvasc 10 and losartan 100 well coontrolled  One year follow up       Medication Adjustments/Labs and Tests Ordered: Current medicines are reviewed at length with the patient today.  Concerns regarding medicines are outlined above.  Orders Placed This Encounter  Procedures   Basic metabolic panel   CBC   EKG 12-Lead    No orders of the defined types were placed in this encounter.   Patient Instructions  Medication Instructions:  Your physician recommends that you continue on your current medications as directed. Please refer to the Current Medication list given to you today.  *If you need a refill on your cardiac medications before your next appointment,  please call your pharmacy*   Lab Work: TODAY: CBC, BMET If you have labs (blood work) drawn today and your tests are completely normal, you will receive your results only by: [place] (if you have MyChart) OR A paper copy in the mail If you have any lab test that is abnormal or we need to change your treatment, we will call you to review the results.   Testing/Procedures: NONE   Follow-Up: At [facility], you and your health needs are our priority.  As part of our continuing mission to provide you with exceptional heart care, we have created designated Provider Care Teams.  These Care Teams include your primary Cardiologist (physician) and Advanced Practice Providers (APPs -  Physician Assistants and Nurse Practitioners) who all work together to provide you with the care you need, when you need it.   Your next appointment:   12 month(s)  The format for your next appointment:   In Person  Provider:   Rudean Haskell, MD       Signed, Werner Lean, MD  11/16/2020 9:40 AM    [place]

## 2020-11-16 ENCOUNTER — Other Ambulatory Visit: Payer: Self-pay

## 2020-11-16 ENCOUNTER — Ambulatory Visit: Payer: Medicare PPO | Admitting: Internal Medicine

## 2020-11-16 ENCOUNTER — Encounter: Payer: Self-pay | Admitting: Internal Medicine

## 2020-11-16 ENCOUNTER — Other Ambulatory Visit: Payer: Self-pay | Admitting: Internal Medicine

## 2020-11-16 ENCOUNTER — Other Ambulatory Visit (HOSPITAL_COMMUNITY)
Admission: RE | Admit: 2020-11-16 | Discharge: 2020-11-16 | Disposition: A | Discharge status: Home / Self Care | Payer: Medicare PPO | Source: Ambulatory Visit | Attending: Internal Medicine | Admitting: Internal Medicine

## 2020-11-16 ENCOUNTER — Telehealth: Payer: Self-pay

## 2020-11-16 VITALS — BP 138/74 | HR 82 | Ht 65.0 in | Wt 211.6 lb

## 2020-11-16 DIAGNOSIS — I152 Hypertension secondary to endocrine disorders: Secondary | ICD-10-CM | POA: Diagnosis not present

## 2020-11-16 DIAGNOSIS — E1159 Type 2 diabetes mellitus with other circulatory complications: Secondary | ICD-10-CM

## 2020-11-16 DIAGNOSIS — I4821 Permanent atrial fibrillation: Secondary | ICD-10-CM

## 2020-11-16 DIAGNOSIS — E785 Hyperlipidemia, unspecified: Secondary | ICD-10-CM

## 2020-11-16 DIAGNOSIS — N1831 Chronic kidney disease, stage 3a: Secondary | ICD-10-CM | POA: Diagnosis not present

## 2020-11-16 DIAGNOSIS — I340 Nonrheumatic mitral (valve) insufficiency: Secondary | ICD-10-CM

## 2020-11-16 DIAGNOSIS — I700 Atherosclerosis of aorta: Secondary | ICD-10-CM | POA: Insufficient documentation

## 2020-11-16 DIAGNOSIS — E1169 Type 2 diabetes mellitus with other specified complication: Secondary | ICD-10-CM

## 2020-11-16 LAB — CBC
HCT: 43.4 % (ref 39.0–52.0)
Hemoglobin: 13.5 g/dL (ref 13.0–17.0)
MCH: 26.8 pg (ref 26.0–34.0)
MCHC: 31.1 g/dL (ref 30.0–36.0)
MCV: 86.3 fL (ref 80.0–100.0)
Platelets: 156 10*3/uL (ref 150–400)
RBC: 5.03 MIL/uL (ref 4.22–5.81)
RDW: 17 % — ABNORMAL HIGH (ref 11.5–15.5)
WBC: 5.7 10*3/uL (ref 4.0–10.5)
nRBC: 0 % (ref 0.0–0.2)

## 2020-11-16 LAB — BASIC METABOLIC PANEL
Anion gap: 8 (ref 5–15)
BUN: 29 mg/dL — ABNORMAL HIGH (ref 8–23)
CO2: 25 mmol/L (ref 22–32)
Calcium: 9.5 mg/dL (ref 8.9–10.3)
Chloride: 103 mmol/L (ref 98–111)
Creatinine, Ser: 2.06 mg/dL — ABNORMAL HIGH (ref 0.61–1.24)
GFR, Estimated: 33 mL/min — ABNORMAL LOW (ref >60)
Glucose, Bld: 143 mg/dL — ABNORMAL HIGH (ref 70–99)
Potassium: 4.1 mmol/L (ref 3.5–5.1)
Sodium: 136 mmol/L (ref 135–145)

## 2020-11-16 MED ORDER — RIVAROXABAN 15 MG PO TABS: 15.0000 mg | ORAL_TABLET | Freq: Every day | ORAL | 3 refills | Status: DC

## 2020-11-16 NOTE — Telephone Encounter (Signed)
Called pt reviewed results and MD recommendations.  Pt is agreeable to plan order placed.

## 2020-11-16 NOTE — Patient Instructions (Addendum)
Medication Instructions:  Your physician recommends that you continue on your current medications as directed. Please refer to the Current Medication list given to you today.  *If you need a refill on your cardiac medications before your next appointment, please call your pharmacy*   Lab Work: TODAY: CBC, BMET If you have labs (blood work) drawn today and your tests are completely normal, you will receive your results only by: [place] (if you have MyChart) OR A paper copy in the mail If you have any lab test that is abnormal or we need to change your treatment, we will call you to review the results.   Testing/Procedures: NONE   Follow-Up: At [facility], you and your health needs are our priority.  As part of our continuing mission to provide you with exceptional heart care, we have created designated Provider Care Teams.  These Care Teams include your primary Cardiologist (physician) and Advanced Practice Providers (APPs -  Physician Assistants and Nurse Practitioners) who all work together to provide you with the care you need, when you need it.   Your next appointment:   12 month(s)  The format for your next appointment:   In Person  Provider:   Rudean Haskell, MD

## 2020-11-16 NOTE — Telephone Encounter (Signed)
-----   Message from Werner Lean, MD sent at 11/16/2020  3:10 PM EDT ----- Results: Stable CBC, BMP consistent with his CKD Stage III b disease CrCL < 50 Plan: Xarelto 15 mg PO daily  Werner Lean, MD

## 2020-11-19 ENCOUNTER — Encounter: Payer: Self-pay | Admitting: Urology

## 2020-11-19 ENCOUNTER — Other Ambulatory Visit: Payer: Self-pay

## 2020-11-19 ENCOUNTER — Ambulatory Visit: Payer: Medicare PPO | Admitting: Urology

## 2020-11-19 VITALS — BP 130/70 | HR 78 | Temp 98.7°F | Wt 211.6 lb

## 2020-11-19 DIAGNOSIS — N138 Other obstructive and reflux uropathy: Secondary | ICD-10-CM

## 2020-11-19 DIAGNOSIS — R972 Elevated prostate specific antigen [PSA]: Secondary | ICD-10-CM | POA: Diagnosis not present

## 2020-11-19 DIAGNOSIS — R3129 Other microscopic hematuria: Secondary | ICD-10-CM | POA: Diagnosis not present

## 2020-11-19 DIAGNOSIS — N401 Enlarged prostate with lower urinary tract symptoms: Secondary | ICD-10-CM

## 2020-11-19 LAB — MICROSCOPIC EXAMINATION
Epithelial Cells (non renal): NONE SEEN /hpf (ref 0–10)
Renal Epithel, UA: NONE SEEN /hpf
WBC, UA: NONE SEEN /hpf (ref 0–5)

## 2020-11-19 LAB — URINALYSIS, ROUTINE W REFLEX MICROSCOPIC
Bilirubin, UA: NEGATIVE
Glucose, UA: NEGATIVE
Leukocytes,UA: NEGATIVE
Nitrite, UA: NEGATIVE
Specific Gravity, UA: 1.02 (ref 1.005–1.030)
Urobilinogen, Ur: 0.2 mg/dL (ref 0.2–1.0)
pH, UA: 5.5 (ref 5.0–7.5)

## 2020-11-19 NOTE — Progress Notes (Signed)
Urological Symptom Review  Patient is experiencing the following symptoms: Frequent urination Leakage of urine Trouble starting stream Weak stream   Review of Systems  Gastrointestinal (upper)  : Negative for upper GI symptoms  Gastrointestinal (lower) : Negative for lower GI symptoms  Constitutional : Negative for symptoms  Skin: Negative for skin symptoms  Eyes: Negative for eye symptoms  Ear/Nose/Throat : Negative for Ear/Nose/Throat symptoms  Hematologic/Lymphatic: Negative for Hematologic/Lymphatic symptoms  Cardiovascular : Negative for cardiovascular symptoms  Respiratory : Shortness of breath  Endocrine: Negative for endocrine symptoms  Musculoskeletal: Joint pain  Neurological: Negative for neurological symptoms  Psychologic: Negative for psychiatric symptoms

## 2020-11-19 NOTE — Progress Notes (Signed)
Assessment: 1. Elevated PSA   2. BPH with obstruction/lower urinary tract symptoms   3. Microscopic hematuria     Plan: Free and total PSA today Today I had a long discussion with the patient regarding PSA and the rationale and controversies of prostate cancer early detection.  I discussed the pros and cons of further evaluation including TRUS and prostate Bx.  Potential adverse events and complications as well as standard instructions were given.  Patient expressed his understanding of these issues. The patient and his wife wish to pursue further evaluation with a prostate biopsy. I will contact his cardiologist to discuss holding his Xarelto prior to the biopsy. I will contact them to schedule this once I have heard from cardiology. Will need to consider further evaluation of microscopic hematuria if findings persist on follow-up U/A's   Chief Complaint:  Chief Complaint  Patient presents with   Elevated PSA    History of Present Illness:  Brendan Scott is a 75 y.o. year old male who is seen in consultation from Sharilyn Sites, MD for evaluation of elevated PSA. PSA data: 7/12 1.44 7/14 1.42 2/16 1.6 2/17 2.5 9/18 3.4 7/19 3.3 3/20 2.5 2/21 2.9 9/22 4.5  No family history of prostate cancer.  No history of prostatitis or UTIs. He does have lower urinary tract symptoms including frequency, urgency, nocturia, postvoid dribbling, intermittent stream, and hesitancy.  No dysuria or gross hematuria. AUA score = 20 today.  He is on Xarelto for A. fib.  Past Medical History:  Past Medical History:  Diagnosis Date   Aortic atherosclerosis (HCC)    Arthritis    CKD (chronic kidney disease), stage III (HCC)    Coronary artery calcification    Diabetes mellitus    HTN (hypertension)    Hyperlipidemia    Permanent atrial fibrillation (HCC)    Seizure disorder (HCC)    Stroke (HCC)     Past Surgical History:  Past Surgical History:  Procedure Laterality Date   TOTAL  KNEE ARTHROPLASTY     Bilateral    Allergies:  No Known Allergies  Family History:  Family History  Problem Relation Age of Onset   Arthritis Other    Diabetes Other     Social History:  Social History   Tobacco Use   Smoking status: Former    Types: Cigarettes    Start date: 01/31/1963    Quit date: 01/30/1978    Years since quitting: 42.8   Smokeless tobacco: Never   Tobacco comments:    quit 30 years ago  Substance Use Topics   Alcohol use: No    Alcohol/week: 0.0 standard drinks   Drug use: No    Review of symptoms:  Constitutional:  Negative for unexplained weight loss, night sweats, fever, chills ENT:  Negative for nose bleeds, sinus pain, painful swallowing CV:  Negative for chest pain, shortness of breath, exercise intolerance, palpitations, loss of consciousness Resp:  Negative for cough, wheezing, shortness of breath GI:  Negative for nausea, vomiting, diarrhea, bloody stools GU:  Positives noted in HPI; otherwise negative for gross hematuria, dysuria Neuro:  Negative for seizures, poor balance, limb weakness, slurred speech Psych:  Negative for lack of energy, depression, anxiety Endocrine:  Negative for polydipsia, polyuria, symptoms of hypoglycemia (dizziness, hunger, sweating) Hematologic:  Negative for anemia, purpura, petechia, prolonged or excessive bleeding, use of anticoagulants  Allergic:  Negative for difficulty breathing or choking as a result of exposure to anything; no shellfish allergy; no  allergic response (rash/itch) to materials, foods  Physical exam: BP 130/70   Pulse 78   Temp 98.7 F (37.1 C)   Wt 211 lb 9.6 oz (96 kg)   BMI 35.21 kg/m  GENERAL APPEARANCE:  Well appearing, well developed, well nourished, NAD HEENT: Atraumatic, Normocephalic, oropharynx clear. NECK: Supple without lymphadenopathy or thyromegaly. LUNGS: Clear to auscultation bilaterally. HEART: Regular Rate and Rhythm without murmurs, gallops, or rubs. ABDOMEN: Soft,  non-tender, No Masses. EXTREMITIES: Moves all extremities well.  Without clubbing, cyanosis, or edema. NEUROLOGIC:  Alert and oriented x 3, normal gait, CN II-XII grossly intact.  MENTAL STATUS:  Appropriate. BACK:  Non-tender to palpation.  No CVAT SKIN:  Warm, dry and intact.   GU: Penis:  circumcised Meatus: Normal Scrotum: normal, no masses Testis: normal without masses bilateral Epididymis: normal Prostate: 40 g, NT, no nodules Rectum: normal   Results: Results for orders placed or performed in visit on 11/19/20 (from the past 24 hour(s))  Urinalysis, Routine w reflex microscopic     Status: Abnormal   Collection Time: 11/19/20 12:51 PM  Result Value Ref Range   Specific Gravity, UA 1.020 1.005 - 1.030   pH, UA 5.5 5.0 - 7.5   Color, UA Amber (A) Yellow   Appearance Ur Clear Clear   Leukocytes,UA Negative Negative   Protein,UA Trace (A) Negative/Trace   Glucose, UA Negative Negative   Ketones, UA Trace (A) Negative   RBC, UA 1+ (A) Negative   Bilirubin, UA Negative Negative   Urobilinogen, Ur 0.2 0.2 - 1.0 mg/dL   Nitrite, UA Negative Negative   Microscopic Examination See below:    Narrative   Performed at:  [facility] [address] [place], [place]  [id number] Lab Director: Mina Marble MT, Phone:  [phone]  Microscopic Examination     Status: Abnormal   Collection Time: 11/19/20 12:51 PM   Urine  Result Value Ref Range   WBC, UA None seen 0 - 5 /hpf   RBC 3-10 (A) 0 - 2 /hpf   Epithelial Cells (non renal) None seen 0 - 10 /hpf   Renal Epithel, UA None seen None seen /hpf   Mucus, UA Present Not Estab.   Bacteria, UA Few (A) None seen/Few   Narrative   Performed at:  [facility] [address] [place], [place]  [id number] Lab Director: [place], Phone:  [phone]

## 2020-11-20 LAB — PSA, TOTAL AND FREE
PSA, Free Pct: 34.8 %
PSA, Free: 1.08 ng/mL
Prostate Specific Ag, Serum: 3.1 ng/mL (ref 0.0–4.0)

## 2020-11-23 DIAGNOSIS — L84 Corns and callosities: Secondary | ICD-10-CM | POA: Diagnosis not present

## 2020-11-23 DIAGNOSIS — M79676 Pain in unspecified toe(s): Secondary | ICD-10-CM | POA: Diagnosis not present

## 2020-11-23 DIAGNOSIS — E1142 Type 2 diabetes mellitus with diabetic polyneuropathy: Secondary | ICD-10-CM | POA: Diagnosis not present

## 2020-11-23 DIAGNOSIS — B351 Tinea unguium: Secondary | ICD-10-CM | POA: Diagnosis not present

## 2020-12-01 DIAGNOSIS — D329 Benign neoplasm of meninges, unspecified: Secondary | ICD-10-CM | POA: Diagnosis not present

## 2020-12-01 DIAGNOSIS — Z6834 Body mass index (BMI) 34.0-34.9, adult: Secondary | ICD-10-CM | POA: Diagnosis not present

## 2020-12-01 DIAGNOSIS — G894 Chronic pain syndrome: Secondary | ICD-10-CM | POA: Diagnosis not present

## 2020-12-16 ENCOUNTER — Other Ambulatory Visit: Payer: Self-pay

## 2020-12-16 ENCOUNTER — Other Ambulatory Visit: Payer: Medicare PPO

## 2020-12-16 DIAGNOSIS — R972 Elevated prostate specific antigen [PSA]: Secondary | ICD-10-CM

## 2020-12-16 DIAGNOSIS — R3129 Other microscopic hematuria: Secondary | ICD-10-CM

## 2020-12-16 DIAGNOSIS — N401 Enlarged prostate with lower urinary tract symptoms: Secondary | ICD-10-CM

## 2020-12-16 DIAGNOSIS — N138 Other obstructive and reflux uropathy: Secondary | ICD-10-CM

## 2020-12-17 DIAGNOSIS — N138 Other obstructive and reflux uropathy: Secondary | ICD-10-CM | POA: Diagnosis not present

## 2020-12-17 DIAGNOSIS — R972 Elevated prostate specific antigen [PSA]: Secondary | ICD-10-CM | POA: Diagnosis not present

## 2020-12-17 DIAGNOSIS — N401 Enlarged prostate with lower urinary tract symptoms: Secondary | ICD-10-CM | POA: Diagnosis not present

## 2020-12-17 DIAGNOSIS — R3129 Other microscopic hematuria: Secondary | ICD-10-CM | POA: Diagnosis not present

## 2020-12-17 LAB — URINALYSIS, ROUTINE W REFLEX MICROSCOPIC
Bilirubin, UA: NEGATIVE
Glucose, UA: NEGATIVE
Ketones, UA: NEGATIVE
Leukocytes,UA: NEGATIVE
Nitrite, UA: NEGATIVE
Protein,UA: NEGATIVE
Specific Gravity, UA: 1.02 (ref 1.005–1.030)
Urobilinogen, Ur: 1 mg/dL (ref 0.2–1.0)
pH, UA: 7 (ref 5.0–7.5)

## 2020-12-17 LAB — MICROSCOPIC EXAMINATION
Bacteria, UA: NONE SEEN
Epithelial Cells (non renal): NONE SEEN /hpf (ref 0–10)
Renal Epithel, UA: NONE SEEN /hpf
WBC, UA: NONE SEEN /hpf (ref 0–5)

## 2020-12-30 DIAGNOSIS — N189 Chronic kidney disease, unspecified: Secondary | ICD-10-CM | POA: Diagnosis not present

## 2020-12-30 DIAGNOSIS — I129 Hypertensive chronic kidney disease with stage 1 through stage 4 chronic kidney disease, or unspecified chronic kidney disease: Secondary | ICD-10-CM | POA: Diagnosis not present

## 2020-12-30 DIAGNOSIS — E1129 Type 2 diabetes mellitus with other diabetic kidney complication: Secondary | ICD-10-CM | POA: Diagnosis not present

## 2020-12-30 DIAGNOSIS — R972 Elevated prostate specific antigen [PSA]: Secondary | ICD-10-CM | POA: Diagnosis not present

## 2020-12-30 DIAGNOSIS — E1122 Type 2 diabetes mellitus with diabetic chronic kidney disease: Secondary | ICD-10-CM | POA: Diagnosis not present

## 2020-12-30 DIAGNOSIS — D638 Anemia in other chronic diseases classified elsewhere: Secondary | ICD-10-CM | POA: Diagnosis not present

## 2020-12-30 DIAGNOSIS — R809 Proteinuria, unspecified: Secondary | ICD-10-CM | POA: Diagnosis not present

## 2021-01-05 DIAGNOSIS — E6609 Other obesity due to excess calories: Secondary | ICD-10-CM | POA: Diagnosis not present

## 2021-01-05 DIAGNOSIS — Z6836 Body mass index (BMI) 36.0-36.9, adult: Secondary | ICD-10-CM | POA: Diagnosis not present

## 2021-01-05 DIAGNOSIS — G894 Chronic pain syndrome: Secondary | ICD-10-CM | POA: Diagnosis not present

## 2021-01-05 DIAGNOSIS — M1991 Primary osteoarthritis, unspecified site: Secondary | ICD-10-CM | POA: Diagnosis not present

## 2021-01-07 DIAGNOSIS — N189 Chronic kidney disease, unspecified: Secondary | ICD-10-CM | POA: Diagnosis not present

## 2021-01-07 DIAGNOSIS — R809 Proteinuria, unspecified: Secondary | ICD-10-CM | POA: Diagnosis not present

## 2021-01-07 DIAGNOSIS — E1122 Type 2 diabetes mellitus with diabetic chronic kidney disease: Secondary | ICD-10-CM | POA: Diagnosis not present

## 2021-01-07 DIAGNOSIS — D638 Anemia in other chronic diseases classified elsewhere: Secondary | ICD-10-CM | POA: Diagnosis not present

## 2021-01-07 DIAGNOSIS — E1129 Type 2 diabetes mellitus with other diabetic kidney complication: Secondary | ICD-10-CM | POA: Diagnosis not present

## 2021-01-07 DIAGNOSIS — I9589 Other hypotension: Secondary | ICD-10-CM | POA: Diagnosis not present

## 2021-02-03 DIAGNOSIS — M1712 Unilateral primary osteoarthritis, left knee: Secondary | ICD-10-CM | POA: Diagnosis not present

## 2021-02-03 DIAGNOSIS — G894 Chronic pain syndrome: Secondary | ICD-10-CM | POA: Diagnosis not present

## 2021-02-03 DIAGNOSIS — E119 Type 2 diabetes mellitus without complications: Secondary | ICD-10-CM | POA: Diagnosis not present

## 2021-02-03 DIAGNOSIS — I10 Essential (primary) hypertension: Secondary | ICD-10-CM | POA: Diagnosis not present

## 2021-02-03 DIAGNOSIS — G8190 Hemiplegia, unspecified affecting unspecified side: Secondary | ICD-10-CM | POA: Diagnosis not present

## 2021-02-03 DIAGNOSIS — I639 Cerebral infarction, unspecified: Secondary | ICD-10-CM | POA: Diagnosis not present

## 2021-02-03 DIAGNOSIS — D329 Benign neoplasm of meninges, unspecified: Secondary | ICD-10-CM | POA: Diagnosis not present

## 2021-02-03 DIAGNOSIS — I700 Atherosclerosis of aorta: Secondary | ICD-10-CM | POA: Diagnosis not present

## 2021-02-03 DIAGNOSIS — I4891 Unspecified atrial fibrillation: Secondary | ICD-10-CM | POA: Diagnosis not present

## 2021-02-08 DIAGNOSIS — L84 Corns and callosities: Secondary | ICD-10-CM | POA: Diagnosis not present

## 2021-02-08 DIAGNOSIS — M79676 Pain in unspecified toe(s): Secondary | ICD-10-CM | POA: Diagnosis not present

## 2021-02-08 DIAGNOSIS — E1142 Type 2 diabetes mellitus with diabetic polyneuropathy: Secondary | ICD-10-CM | POA: Diagnosis not present

## 2021-02-08 DIAGNOSIS — B351 Tinea unguium: Secondary | ICD-10-CM | POA: Diagnosis not present

## 2021-02-17 ENCOUNTER — Ambulatory Visit: Payer: Medicare PPO | Admitting: Urology

## 2021-02-17 DIAGNOSIS — R3129 Other microscopic hematuria: Secondary | ICD-10-CM

## 2021-02-17 DIAGNOSIS — N138 Other obstructive and reflux uropathy: Secondary | ICD-10-CM

## 2021-02-17 DIAGNOSIS — R972 Elevated prostate specific antigen [PSA]: Secondary | ICD-10-CM

## 2021-02-24 ENCOUNTER — Ambulatory Visit: Payer: Self-pay | Admitting: Urology

## 2021-02-24 DIAGNOSIS — R972 Elevated prostate specific antigen [PSA]: Secondary | ICD-10-CM

## 2021-02-24 DIAGNOSIS — N138 Other obstructive and reflux uropathy: Secondary | ICD-10-CM

## 2021-02-24 NOTE — Progress Notes (Deleted)
° °  Assessment: 1. Elevated PSA   2. BPH with obstruction/lower urinary tract symptoms     Plan:   Chief Complaint:  No chief complaint on file.   History of Present Illness:  Brendan Scott is a 76 y.o. year old male who is seen for further evaluation of elevated PSA. PSA data: 7/12 1.44 7/14 1.42 2/16 1.6 2/17 2.5 9/18 3.4 7/19 3.3 3/20 2.5 2/21 2.9 9/22 4.5 10/22 3.1 34.8% free  No family history of prostate cancer.  No history of prostatitis or UTIs. He does have lower urinary tract symptoms including frequency, urgency, nocturia, postvoid dribbling, intermittent stream, and hesitancy.  No dysuria or gross hematuria. AUA score = 20. His urinalysis from 10/22 demonstrated 3-10 RBCs. Repeat urinalysis from 12/17/2020 demonstrated 0-2 RBCs.  He is on Xarelto for A. fib.  Portions of the above documentation were copied from a prior visit for review purposes only.   Past Medical History:  Past Medical History:  Diagnosis Date   Aortic atherosclerosis (HCC)    Arthritis    CKD (chronic kidney disease), stage III (HCC)    Coronary artery calcification    Diabetes mellitus    HTN (hypertension)    Hyperlipidemia    Permanent atrial fibrillation (HCC)    Seizure disorder (HCC)    Stroke (HCC)     Past Surgical History:  Past Surgical History:  Procedure Laterality Date   TOTAL KNEE ARTHROPLASTY     Bilateral    Allergies:  No Known Allergies  Family History:  Family History  Problem Relation Age of Onset   Arthritis Other    Diabetes Other     Social History:  Social History   Tobacco Use   Smoking status: Former    Types: Cigarettes    Start date: 01/31/1963    Quit date: 01/30/1978    Years since quitting: 43.0   Smokeless tobacco: Never   Tobacco comments:    quit 30 years ago  Substance Use Topics   Alcohol use: No    Alcohol/week: 0.0 standard drinks   Drug use: No    ROS: Constitutional:  Negative for fever, chills, weight  loss CV: Negative for chest pain, previous MI, hypertension Respiratory:  Negative for shortness of breath, wheezing, sleep apnea, frequent cough GI:  Negative for nausea, vomiting, bloody stool, GERD  Physical exam: There were no vitals taken for this visit. ***  Results: No results found for this or any previous visit (from the past 24 hour(s)).

## 2021-03-03 ENCOUNTER — Ambulatory Visit: Payer: Medicare HMO | Admitting: Urology

## 2021-03-03 ENCOUNTER — Other Ambulatory Visit: Payer: Self-pay

## 2021-03-03 ENCOUNTER — Encounter: Payer: Self-pay | Admitting: Urology

## 2021-03-03 VITALS — BP 150/80 | HR 75 | Wt 213.0 lb

## 2021-03-03 DIAGNOSIS — R972 Elevated prostate specific antigen [PSA]: Secondary | ICD-10-CM | POA: Diagnosis not present

## 2021-03-03 DIAGNOSIS — R3129 Other microscopic hematuria: Secondary | ICD-10-CM

## 2021-03-03 DIAGNOSIS — N138 Other obstructive and reflux uropathy: Secondary | ICD-10-CM | POA: Diagnosis not present

## 2021-03-03 DIAGNOSIS — N401 Enlarged prostate with lower urinary tract symptoms: Secondary | ICD-10-CM

## 2021-03-03 NOTE — Progress Notes (Signed)
° °  Assessment: 1. BPH with obstruction/lower urinary tract symptoms   2. Elevated PSA     Plan: PSA today - will call with results Return to office in 6 months  Chief Complaint:  Chief Complaint  Patient presents with   Elevated PSA   Benign Prostatic Hypertrophy    History of Present Illness:  Brendan Scott is a 76 y.o. year old male who is seen for further evaluation of elevated PSA. PSA data: 7/12 1.44 7/14 1.42 2/16 1.6 2/17 2.5 9/18 3.4 7/19 3.3 3/20 2.5 2/21 2.9 9/22 4.5 10/22 3.1 34.8% free  No family history of prostate cancer.  No history of prostatitis or UTIs. He does have lower urinary tract symptoms including frequency, urgency, nocturia, postvoid dribbling, intermittent stream, and hesitancy.  No dysuria or gross hematuria. AUA score = 20. His urinalysis from 10/22 demonstrated 3-10 RBCs. Repeat urinalysis from 12/17/2020 demonstrated 0-2 RBCs.  He returns today for follow-up.  No change in his lower urinary tract symptoms.  He continues to have some frequency and nocturia.  No dysuria or gross hematuria.  He continues  on Xarelto for A. fib.  Portions of the above documentation were copied from a prior visit for review purposes only.   Past Medical History:  Past Medical History:  Diagnosis Date   Aortic atherosclerosis (HCC)    Arthritis    CKD (chronic kidney disease), stage III (HCC)    Coronary artery calcification    Diabetes mellitus    HTN (hypertension)    Hyperlipidemia    Permanent atrial fibrillation (HCC)    Seizure disorder (HCC)    Stroke (HCC)     Past Surgical History:  Past Surgical History:  Procedure Laterality Date   TOTAL KNEE ARTHROPLASTY     Bilateral    Allergies:  No Known Allergies  Family History:  Family History  Problem Relation Age of Onset   Arthritis Other    Diabetes Other     Social History:  Social History   Tobacco Use   Smoking status: Former    Types: Cigarettes    Start date:  01/31/1963    Quit date: 01/30/1978    Years since quitting: 43.1   Smokeless tobacco: Never   Tobacco comments:    quit 30 years ago  Substance Use Topics   Alcohol use: No    Alcohol/week: 0.0 standard drinks   Drug use: No    ROS: Constitutional:  Negative for fever, chills, weight loss CV: Negative for chest pain, previous MI, hypertension Respiratory:  Negative for shortness of breath, wheezing, sleep apnea, frequent cough GI:  Negative for nausea, vomiting, bloody stool, GERD  Physical exam: BP (!) 150/80    Pulse 75    Wt 213 lb (96.6 kg)    BMI 35.45 kg/m  GENERAL APPEARANCE:  Well appearing, well developed, well nourished, NAD HEENT:  Atraumatic, normocephalic, oropharynx clear NECK:  Supple without lymphadenopathy or thyromegaly ABDOMEN:  Soft, non-tender, no masses EXTREMITIES:  Moves all extremities well, without clubbing, cyanosis, or edema NEUROLOGIC:  Alert and oriented x 3, normal gait, CN II-XII grossly intact MENTAL STATUS:  appropriate BACK:  Non-tender to palpation, No CVAT SKIN:  Warm, dry, and intact   Results: U/A:  dipstick negative

## 2021-03-03 NOTE — Progress Notes (Signed)

## 2021-03-04 LAB — URINALYSIS, ROUTINE W REFLEX MICROSCOPIC
Bilirubin, UA: NEGATIVE
Glucose, UA: NEGATIVE
Ketones, UA: NEGATIVE
Leukocytes,UA: NEGATIVE
Nitrite, UA: NEGATIVE
Protein,UA: NEGATIVE
RBC, UA: NEGATIVE
Specific Gravity, UA: 1.02 (ref 1.005–1.030)
Urobilinogen, Ur: 0.2 mg/dL (ref 0.2–1.0)
pH, UA: 6 (ref 5.0–7.5)

## 2021-03-04 LAB — PSA: Prostate Specific Ag, Serum: 2.3 ng/mL (ref 0.0–4.0)

## 2021-03-08 DIAGNOSIS — G894 Chronic pain syndrome: Secondary | ICD-10-CM | POA: Diagnosis not present

## 2021-03-08 DIAGNOSIS — E6609 Other obesity due to excess calories: Secondary | ICD-10-CM | POA: Diagnosis not present

## 2021-03-08 DIAGNOSIS — Z6836 Body mass index (BMI) 36.0-36.9, adult: Secondary | ICD-10-CM | POA: Diagnosis not present

## 2021-03-09 DIAGNOSIS — N189 Chronic kidney disease, unspecified: Secondary | ICD-10-CM | POA: Diagnosis not present

## 2021-03-09 DIAGNOSIS — E1122 Type 2 diabetes mellitus with diabetic chronic kidney disease: Secondary | ICD-10-CM | POA: Diagnosis not present

## 2021-03-09 DIAGNOSIS — R809 Proteinuria, unspecified: Secondary | ICD-10-CM | POA: Diagnosis not present

## 2021-03-09 DIAGNOSIS — E1129 Type 2 diabetes mellitus with other diabetic kidney complication: Secondary | ICD-10-CM | POA: Diagnosis not present

## 2021-03-09 DIAGNOSIS — I9589 Other hypotension: Secondary | ICD-10-CM | POA: Diagnosis not present

## 2021-03-09 DIAGNOSIS — D638 Anemia in other chronic diseases classified elsewhere: Secondary | ICD-10-CM | POA: Diagnosis not present

## 2021-03-15 DIAGNOSIS — E559 Vitamin D deficiency, unspecified: Secondary | ICD-10-CM | POA: Diagnosis not present

## 2021-03-15 DIAGNOSIS — I129 Hypertensive chronic kidney disease with stage 1 through stage 4 chronic kidney disease, or unspecified chronic kidney disease: Secondary | ICD-10-CM | POA: Diagnosis not present

## 2021-03-15 DIAGNOSIS — R809 Proteinuria, unspecified: Secondary | ICD-10-CM | POA: Diagnosis not present

## 2021-03-15 DIAGNOSIS — N401 Enlarged prostate with lower urinary tract symptoms: Secondary | ICD-10-CM | POA: Diagnosis not present

## 2021-03-15 DIAGNOSIS — E1129 Type 2 diabetes mellitus with other diabetic kidney complication: Secondary | ICD-10-CM | POA: Diagnosis not present

## 2021-03-15 DIAGNOSIS — N189 Chronic kidney disease, unspecified: Secondary | ICD-10-CM | POA: Diagnosis not present

## 2021-03-15 DIAGNOSIS — E1122 Type 2 diabetes mellitus with diabetic chronic kidney disease: Secondary | ICD-10-CM | POA: Diagnosis not present

## 2021-04-05 DIAGNOSIS — J22 Unspecified acute lower respiratory infection: Secondary | ICD-10-CM | POA: Diagnosis not present

## 2021-04-05 DIAGNOSIS — Z6836 Body mass index (BMI) 36.0-36.9, adult: Secondary | ICD-10-CM | POA: Diagnosis not present

## 2021-04-05 DIAGNOSIS — E6609 Other obesity due to excess calories: Secondary | ICD-10-CM | POA: Diagnosis not present

## 2021-04-05 DIAGNOSIS — G894 Chronic pain syndrome: Secondary | ICD-10-CM | POA: Diagnosis not present

## 2021-04-14 DIAGNOSIS — H35372 Puckering of macula, left eye: Secondary | ICD-10-CM | POA: Diagnosis not present

## 2021-04-19 DIAGNOSIS — M79676 Pain in unspecified toe(s): Secondary | ICD-10-CM | POA: Diagnosis not present

## 2021-04-19 DIAGNOSIS — E1142 Type 2 diabetes mellitus with diabetic polyneuropathy: Secondary | ICD-10-CM | POA: Diagnosis not present

## 2021-04-19 DIAGNOSIS — L84 Corns and callosities: Secondary | ICD-10-CM | POA: Diagnosis not present

## 2021-04-19 DIAGNOSIS — B351 Tinea unguium: Secondary | ICD-10-CM | POA: Diagnosis not present

## 2021-04-26 DIAGNOSIS — I10 Essential (primary) hypertension: Secondary | ICD-10-CM | POA: Diagnosis not present

## 2021-04-26 DIAGNOSIS — Z0001 Encounter for general adult medical examination with abnormal findings: Secondary | ICD-10-CM | POA: Diagnosis not present

## 2021-04-26 DIAGNOSIS — N183 Chronic kidney disease, stage 3 unspecified: Secondary | ICD-10-CM | POA: Diagnosis not present

## 2021-04-26 DIAGNOSIS — E119 Type 2 diabetes mellitus without complications: Secondary | ICD-10-CM | POA: Diagnosis not present

## 2021-04-26 DIAGNOSIS — G894 Chronic pain syndrome: Secondary | ICD-10-CM | POA: Diagnosis not present

## 2021-04-26 DIAGNOSIS — E559 Vitamin D deficiency, unspecified: Secondary | ICD-10-CM | POA: Diagnosis not present

## 2021-04-26 DIAGNOSIS — N401 Enlarged prostate with lower urinary tract symptoms: Secondary | ICD-10-CM | POA: Diagnosis not present

## 2021-04-26 DIAGNOSIS — E6609 Other obesity due to excess calories: Secondary | ICD-10-CM | POA: Diagnosis not present

## 2021-04-26 DIAGNOSIS — M1991 Primary osteoarthritis, unspecified site: Secondary | ICD-10-CM | POA: Diagnosis not present

## 2021-04-26 DIAGNOSIS — Z1331 Encounter for screening for depression: Secondary | ICD-10-CM | POA: Diagnosis not present

## 2021-04-26 DIAGNOSIS — I4891 Unspecified atrial fibrillation: Secondary | ICD-10-CM | POA: Diagnosis not present

## 2021-05-19 DIAGNOSIS — E1122 Type 2 diabetes mellitus with diabetic chronic kidney disease: Secondary | ICD-10-CM | POA: Diagnosis not present

## 2021-05-19 DIAGNOSIS — N401 Enlarged prostate with lower urinary tract symptoms: Secondary | ICD-10-CM | POA: Diagnosis not present

## 2021-05-19 DIAGNOSIS — I129 Hypertensive chronic kidney disease with stage 1 through stage 4 chronic kidney disease, or unspecified chronic kidney disease: Secondary | ICD-10-CM | POA: Diagnosis not present

## 2021-05-19 DIAGNOSIS — E559 Vitamin D deficiency, unspecified: Secondary | ICD-10-CM | POA: Diagnosis not present

## 2021-05-19 DIAGNOSIS — E1129 Type 2 diabetes mellitus with other diabetic kidney complication: Secondary | ICD-10-CM | POA: Diagnosis not present

## 2021-05-19 DIAGNOSIS — N189 Chronic kidney disease, unspecified: Secondary | ICD-10-CM | POA: Diagnosis not present

## 2021-05-19 DIAGNOSIS — R809 Proteinuria, unspecified: Secondary | ICD-10-CM | POA: Diagnosis not present

## 2021-05-26 DIAGNOSIS — E559 Vitamin D deficiency, unspecified: Secondary | ICD-10-CM | POA: Diagnosis not present

## 2021-05-26 DIAGNOSIS — E1129 Type 2 diabetes mellitus with other diabetic kidney complication: Secondary | ICD-10-CM | POA: Diagnosis not present

## 2021-05-26 DIAGNOSIS — I129 Hypertensive chronic kidney disease with stage 1 through stage 4 chronic kidney disease, or unspecified chronic kidney disease: Secondary | ICD-10-CM | POA: Diagnosis not present

## 2021-05-26 DIAGNOSIS — Z6835 Body mass index (BMI) 35.0-35.9, adult: Secondary | ICD-10-CM | POA: Diagnosis not present

## 2021-05-26 DIAGNOSIS — E1122 Type 2 diabetes mellitus with diabetic chronic kidney disease: Secondary | ICD-10-CM | POA: Diagnosis not present

## 2021-05-26 DIAGNOSIS — R809 Proteinuria, unspecified: Secondary | ICD-10-CM | POA: Diagnosis not present

## 2021-05-26 DIAGNOSIS — N189 Chronic kidney disease, unspecified: Secondary | ICD-10-CM | POA: Diagnosis not present

## 2021-06-03 DIAGNOSIS — G894 Chronic pain syndrome: Secondary | ICD-10-CM | POA: Diagnosis not present

## 2021-06-03 DIAGNOSIS — E6609 Other obesity due to excess calories: Secondary | ICD-10-CM | POA: Diagnosis not present

## 2021-06-03 DIAGNOSIS — Z6836 Body mass index (BMI) 36.0-36.9, adult: Secondary | ICD-10-CM | POA: Diagnosis not present

## 2021-06-28 DIAGNOSIS — M79676 Pain in unspecified toe(s): Secondary | ICD-10-CM | POA: Diagnosis not present

## 2021-06-28 DIAGNOSIS — E1142 Type 2 diabetes mellitus with diabetic polyneuropathy: Secondary | ICD-10-CM | POA: Diagnosis not present

## 2021-06-28 DIAGNOSIS — B351 Tinea unguium: Secondary | ICD-10-CM | POA: Diagnosis not present

## 2021-06-28 DIAGNOSIS — L84 Corns and callosities: Secondary | ICD-10-CM | POA: Diagnosis not present

## 2021-06-29 DIAGNOSIS — I10 Essential (primary) hypertension: Secondary | ICD-10-CM | POA: Diagnosis not present

## 2021-06-29 DIAGNOSIS — E119 Type 2 diabetes mellitus without complications: Secondary | ICD-10-CM | POA: Diagnosis not present

## 2021-06-30 DIAGNOSIS — I4891 Unspecified atrial fibrillation: Secondary | ICD-10-CM | POA: Diagnosis not present

## 2021-06-30 DIAGNOSIS — I10 Essential (primary) hypertension: Secondary | ICD-10-CM | POA: Diagnosis not present

## 2021-06-30 DIAGNOSIS — N183 Chronic kidney disease, stage 3 unspecified: Secondary | ICD-10-CM | POA: Diagnosis not present

## 2021-06-30 DIAGNOSIS — Z6835 Body mass index (BMI) 35.0-35.9, adult: Secondary | ICD-10-CM | POA: Diagnosis not present

## 2021-06-30 DIAGNOSIS — J329 Chronic sinusitis, unspecified: Secondary | ICD-10-CM | POA: Diagnosis not present

## 2021-06-30 DIAGNOSIS — G894 Chronic pain syndrome: Secondary | ICD-10-CM | POA: Diagnosis not present

## 2021-08-01 DIAGNOSIS — N183 Chronic kidney disease, stage 3 unspecified: Secondary | ICD-10-CM | POA: Diagnosis not present

## 2021-08-01 DIAGNOSIS — E6609 Other obesity due to excess calories: Secondary | ICD-10-CM | POA: Diagnosis not present

## 2021-08-01 DIAGNOSIS — G894 Chronic pain syndrome: Secondary | ICD-10-CM | POA: Diagnosis not present

## 2021-08-01 DIAGNOSIS — I4891 Unspecified atrial fibrillation: Secondary | ICD-10-CM | POA: Diagnosis not present

## 2021-08-01 DIAGNOSIS — J329 Chronic sinusitis, unspecified: Secondary | ICD-10-CM | POA: Diagnosis not present

## 2021-08-01 DIAGNOSIS — G40109 Localization-related (focal) (partial) symptomatic epilepsy and epileptic syndromes with simple partial seizures, not intractable, without status epilepticus: Secondary | ICD-10-CM | POA: Diagnosis not present

## 2021-08-01 DIAGNOSIS — M1991 Primary osteoarthritis, unspecified site: Secondary | ICD-10-CM | POA: Diagnosis not present

## 2021-08-01 DIAGNOSIS — E1165 Type 2 diabetes mellitus with hyperglycemia: Secondary | ICD-10-CM | POA: Diagnosis not present

## 2021-08-01 DIAGNOSIS — I10 Essential (primary) hypertension: Secondary | ICD-10-CM | POA: Diagnosis not present

## 2021-08-24 DIAGNOSIS — I129 Hypertensive chronic kidney disease with stage 1 through stage 4 chronic kidney disease, or unspecified chronic kidney disease: Secondary | ICD-10-CM | POA: Diagnosis not present

## 2021-08-24 DIAGNOSIS — E1129 Type 2 diabetes mellitus with other diabetic kidney complication: Secondary | ICD-10-CM | POA: Diagnosis not present

## 2021-08-24 DIAGNOSIS — N189 Chronic kidney disease, unspecified: Secondary | ICD-10-CM | POA: Diagnosis not present

## 2021-08-24 DIAGNOSIS — R809 Proteinuria, unspecified: Secondary | ICD-10-CM | POA: Diagnosis not present

## 2021-08-24 DIAGNOSIS — E559 Vitamin D deficiency, unspecified: Secondary | ICD-10-CM | POA: Diagnosis not present

## 2021-08-24 DIAGNOSIS — E1122 Type 2 diabetes mellitus with diabetic chronic kidney disease: Secondary | ICD-10-CM | POA: Diagnosis not present

## 2021-08-30 DIAGNOSIS — E6609 Other obesity due to excess calories: Secondary | ICD-10-CM | POA: Diagnosis not present

## 2021-08-30 DIAGNOSIS — I10 Essential (primary) hypertension: Secondary | ICD-10-CM | POA: Diagnosis not present

## 2021-08-30 DIAGNOSIS — Z6835 Body mass index (BMI) 35.0-35.9, adult: Secondary | ICD-10-CM | POA: Diagnosis not present

## 2021-08-30 DIAGNOSIS — M1991 Primary osteoarthritis, unspecified site: Secondary | ICD-10-CM | POA: Diagnosis not present

## 2021-08-30 DIAGNOSIS — I4891 Unspecified atrial fibrillation: Secondary | ICD-10-CM | POA: Diagnosis not present

## 2021-08-30 DIAGNOSIS — G894 Chronic pain syndrome: Secondary | ICD-10-CM | POA: Diagnosis not present

## 2021-08-30 DIAGNOSIS — N183 Chronic kidney disease, stage 3 unspecified: Secondary | ICD-10-CM | POA: Diagnosis not present

## 2021-09-01 ENCOUNTER — Ambulatory Visit: Payer: Medicare HMO | Admitting: Urology

## 2021-09-01 DIAGNOSIS — I129 Hypertensive chronic kidney disease with stage 1 through stage 4 chronic kidney disease, or unspecified chronic kidney disease: Secondary | ICD-10-CM | POA: Diagnosis not present

## 2021-09-01 DIAGNOSIS — E1129 Type 2 diabetes mellitus with other diabetic kidney complication: Secondary | ICD-10-CM | POA: Diagnosis not present

## 2021-09-01 DIAGNOSIS — Z6835 Body mass index (BMI) 35.0-35.9, adult: Secondary | ICD-10-CM | POA: Diagnosis not present

## 2021-09-01 DIAGNOSIS — E211 Secondary hyperparathyroidism, not elsewhere classified: Secondary | ICD-10-CM | POA: Diagnosis not present

## 2021-09-01 DIAGNOSIS — E8722 Chronic metabolic acidosis: Secondary | ICD-10-CM | POA: Diagnosis not present

## 2021-09-01 DIAGNOSIS — E1122 Type 2 diabetes mellitus with diabetic chronic kidney disease: Secondary | ICD-10-CM | POA: Diagnosis not present

## 2021-09-01 DIAGNOSIS — N189 Chronic kidney disease, unspecified: Secondary | ICD-10-CM | POA: Diagnosis not present

## 2021-09-01 DIAGNOSIS — N1832 Chronic kidney disease, stage 3b: Secondary | ICD-10-CM | POA: Diagnosis not present

## 2021-09-01 DIAGNOSIS — R809 Proteinuria, unspecified: Secondary | ICD-10-CM | POA: Diagnosis not present

## 2021-09-08 ENCOUNTER — Encounter: Payer: Self-pay | Admitting: Urology

## 2021-09-08 ENCOUNTER — Ambulatory Visit (INDEPENDENT_AMBULATORY_CARE_PROVIDER_SITE_OTHER): Payer: Medicare HMO | Admitting: Urology

## 2021-09-08 VITALS — BP 170/81 | HR 69 | Ht 65.0 in | Wt 215.0 lb

## 2021-09-08 DIAGNOSIS — N401 Enlarged prostate with lower urinary tract symptoms: Secondary | ICD-10-CM | POA: Diagnosis not present

## 2021-09-08 DIAGNOSIS — N138 Other obstructive and reflux uropathy: Secondary | ICD-10-CM

## 2021-09-08 DIAGNOSIS — R972 Elevated prostate specific antigen [PSA]: Secondary | ICD-10-CM

## 2021-09-08 LAB — URINALYSIS, ROUTINE W REFLEX MICROSCOPIC
Bilirubin, UA: NEGATIVE
Glucose, UA: NEGATIVE
Ketones, UA: NEGATIVE
Leukocytes,UA: NEGATIVE
Nitrite, UA: NEGATIVE
Protein,UA: NEGATIVE
RBC, UA: NEGATIVE
Specific Gravity, UA: 1.015 (ref 1.005–1.030)
Urobilinogen, Ur: 0.2 mg/dL (ref 0.2–1.0)
pH, UA: 5.5 (ref 5.0–7.5)

## 2021-09-08 NOTE — Progress Notes (Signed)
   Assessment: 1. BPH with obstruction/lower urinary tract symptoms   2. Elevated PSA     Plan: Free and total PSA drawn today Return to office in 6 months  Chief Complaint:  Chief Complaint  Patient presents with   Benign Prostatic Hypertrophy    History of Present Illness: Brendan Scott is a 76 y.o. year old male who is seen for further evaluation of elevated PSA and BPH with obstruction. PSA data: 7/12 1.44 7/14 1.42 2/16 1.6 2/17 2.5 9/18 3.4 7/19 3.3 3/20 2.5 2/21 2.9 9/22 4.5 10/22 3.1 34.8% free 2/23 2.3  No family history of prostate cancer.  No history of prostatitis or UTIs. He does have lower urinary tract symptoms including frequency, urgency, nocturia, postvoid dribbling, intermittent stream, and hesitancy.  No dysuria or gross hematuria. AUA score = 20. His urinalysis from 10/22 demonstrated 3-10 RBCs. Repeat urinalysis from 12/17/2020 demonstrated 0-2 RBCs.  He is on Xarelto for A. fib.  He returns today for follow-up.  He continues on Hytrin 2 mg nightly.  He continues with symptoms of frequency, intermittent stream, and decreased force of stream.  No dysuria or gross hematuria.  His LUTS are not bothersome.    Portions of the above documentation were copied from a prior visit for review purposes only.   Past Medical History:  Past Medical History:  Diagnosis Date   Aortic atherosclerosis (HCC)    Arthritis    CKD (chronic kidney disease), stage III (HCC)    Coronary artery calcification    Diabetes mellitus    HTN (hypertension)    Hyperlipidemia    Permanent atrial fibrillation (HCC)    Seizure disorder (HCC)    Stroke (HCC)     Past Surgical History:  Past Surgical History:  Procedure Laterality Date   TOTAL KNEE ARTHROPLASTY     Bilateral    Allergies:  No Known Allergies  Family History:  Family History  Problem Relation Age of Onset   Arthritis Other    Diabetes Other     Social History:  Social History   Tobacco  Use   Smoking status: Former    Types: Cigarettes    Start date: 01/31/1963    Quit date: 01/30/1978    Years since quitting: 43.6   Smokeless tobacco: Never   Tobacco comments:    quit 30 years ago  Substance Use Topics   Alcohol use: No    Alcohol/week: 0.0 standard drinks of alcohol   Drug use: No    ROS: Constitutional:  Negative for fever, chills, weight loss CV: Negative for chest pain, previous MI, hypertension Respiratory:  Negative for shortness of breath, wheezing, sleep apnea, frequent cough GI:  Negative for nausea, vomiting, bloody stool, GERD  Physical exam: BP (!) 170/81   Pulse 69   Ht '5\' 5"'$  (1.651 m)   Wt 215 lb (97.5 kg)   BMI 35.78 kg/m  GENERAL APPEARANCE:  Well appearing, well developed, well nourished, NAD HEENT:  Atraumatic, normocephalic, oropharynx clear NECK:  Supple without lymphadenopathy or thyromegaly ABDOMEN:  Soft, non-tender, no masses EXTREMITIES:  Moves all extremities well, without clubbing, cyanosis, or edema NEUROLOGIC:  Alert and oriented x 3, normal gait, CN II-XII grossly intact MENTAL STATUS:  appropriate BACK:  Non-tender to palpation, No CVAT SKIN:  Warm, dry, and intact GU: Prostate: 40 gm, NT, no nodules, difficult to feel base Rectum: Normal tone,  no masses or tenderness   Results: U/A: dipstick negative

## 2021-09-09 ENCOUNTER — Telehealth: Payer: Self-pay

## 2021-09-09 LAB — PSA, TOTAL AND FREE
PSA, Free Pct: 44.1 %
PSA, Free: 0.97 ng/mL
Prostate Specific Ag, Serum: 2.2 ng/mL (ref 0.0–4.0)

## 2021-09-09 NOTE — Telephone Encounter (Signed)
Patient aware of results, will f/u as scheduled.

## 2021-09-09 NOTE — Telephone Encounter (Signed)
-----   Message from Primus Bravo, MD sent at 09/09/2021  8:59 AM EDT ----- Please let patient know that his PSA remains normal.

## 2021-09-09 NOTE — Telephone Encounter (Signed)
Tried calling patient and left phone number with patient wife for patient to give our office a call back. Wife voice understanding

## 2021-09-13 DIAGNOSIS — E1142 Type 2 diabetes mellitus with diabetic polyneuropathy: Secondary | ICD-10-CM | POA: Diagnosis not present

## 2021-09-13 DIAGNOSIS — L84 Corns and callosities: Secondary | ICD-10-CM | POA: Diagnosis not present

## 2021-09-13 DIAGNOSIS — M79676 Pain in unspecified toe(s): Secondary | ICD-10-CM | POA: Diagnosis not present

## 2021-09-13 DIAGNOSIS — B351 Tinea unguium: Secondary | ICD-10-CM | POA: Diagnosis not present

## 2021-09-23 DIAGNOSIS — N183 Chronic kidney disease, stage 3 unspecified: Secondary | ICD-10-CM | POA: Diagnosis not present

## 2021-09-23 DIAGNOSIS — G40109 Localization-related (focal) (partial) symptomatic epilepsy and epileptic syndromes with simple partial seizures, not intractable, without status epilepticus: Secondary | ICD-10-CM | POA: Diagnosis not present

## 2021-09-23 DIAGNOSIS — Z6834 Body mass index (BMI) 34.0-34.9, adult: Secondary | ICD-10-CM | POA: Diagnosis not present

## 2021-09-23 DIAGNOSIS — G894 Chronic pain syndrome: Secondary | ICD-10-CM | POA: Diagnosis not present

## 2021-09-23 DIAGNOSIS — I4891 Unspecified atrial fibrillation: Secondary | ICD-10-CM | POA: Diagnosis not present

## 2021-09-23 DIAGNOSIS — I10 Essential (primary) hypertension: Secondary | ICD-10-CM | POA: Diagnosis not present

## 2021-09-23 DIAGNOSIS — M1991 Primary osteoarthritis, unspecified site: Secondary | ICD-10-CM | POA: Diagnosis not present

## 2021-09-23 DIAGNOSIS — E1159 Type 2 diabetes mellitus with other circulatory complications: Secondary | ICD-10-CM | POA: Diagnosis not present

## 2021-09-23 DIAGNOSIS — E6609 Other obesity due to excess calories: Secondary | ICD-10-CM | POA: Diagnosis not present

## 2021-10-14 NOTE — Progress Notes (Signed)
error 

## 2021-10-28 ENCOUNTER — Ambulatory Visit (HOSPITAL_COMMUNITY)
Admission: RE | Admit: 2021-10-28 | Discharge: 2021-10-28 | Disposition: A | Discharge status: Home / Self Care | Payer: Medicare HMO | Source: Ambulatory Visit | Attending: Internal Medicine | Admitting: Internal Medicine

## 2021-10-28 ENCOUNTER — Other Ambulatory Visit (HOSPITAL_COMMUNITY): Payer: Self-pay | Admitting: Internal Medicine

## 2021-10-28 DIAGNOSIS — R0602 Shortness of breath: Secondary | ICD-10-CM | POA: Diagnosis not present

## 2021-10-28 DIAGNOSIS — R0600 Dyspnea, unspecified: Secondary | ICD-10-CM | POA: Diagnosis not present

## 2021-10-28 DIAGNOSIS — E6609 Other obesity due to excess calories: Secondary | ICD-10-CM | POA: Diagnosis not present

## 2021-10-28 DIAGNOSIS — I10 Essential (primary) hypertension: Secondary | ICD-10-CM | POA: Diagnosis not present

## 2021-10-28 DIAGNOSIS — I4891 Unspecified atrial fibrillation: Secondary | ICD-10-CM | POA: Diagnosis not present

## 2021-10-28 DIAGNOSIS — I209 Angina pectoris, unspecified: Secondary | ICD-10-CM | POA: Diagnosis not present

## 2021-10-28 DIAGNOSIS — Z6835 Body mass index (BMI) 35.0-35.9, adult: Secondary | ICD-10-CM | POA: Diagnosis not present

## 2021-10-28 DIAGNOSIS — Z23 Encounter for immunization: Secondary | ICD-10-CM | POA: Diagnosis not present

## 2021-10-28 DIAGNOSIS — R0609 Other forms of dyspnea: Secondary | ICD-10-CM | POA: Diagnosis not present

## 2021-10-28 DIAGNOSIS — E1159 Type 2 diabetes mellitus with other circulatory complications: Secondary | ICD-10-CM | POA: Diagnosis not present

## 2021-10-31 ENCOUNTER — Emergency Department (HOSPITAL_COMMUNITY)
Admission: EM | Admit: 2021-10-31 | Discharge: 2021-10-31 | Disposition: A | Discharge status: Home / Self Care | Payer: Medicare HMO

## 2021-11-07 ENCOUNTER — Telehealth: Payer: Self-pay | Admitting: *Deleted

## 2021-11-07 ENCOUNTER — Other Ambulatory Visit (HOSPITAL_COMMUNITY): Payer: Self-pay | Admitting: Internal Medicine

## 2021-11-07 DIAGNOSIS — R0600 Dyspnea, unspecified: Secondary | ICD-10-CM

## 2021-11-08 ENCOUNTER — Other Ambulatory Visit: Payer: Self-pay | Admitting: Internal Medicine

## 2021-11-08 DIAGNOSIS — I4891 Unspecified atrial fibrillation: Secondary | ICD-10-CM

## 2021-11-08 NOTE — Telephone Encounter (Signed)
Prescription refill request for Xarelto received.  Indication:Afib  Last office visit: 11/16/20 (Chandrasekar)  Weight: 97.5kg Age: 76 Scr:2.06 (11/16/20)  CrCl: 42.69m/min  Appropriate dose and refill sent to requested pharmacy.

## 2021-11-14 DIAGNOSIS — H35033 Hypertensive retinopathy, bilateral: Secondary | ICD-10-CM | POA: Diagnosis not present

## 2021-11-20 NOTE — Progress Notes (Unsigned)
Office Visit    Patient Name: Brendan Scott Date of Encounter: 11/21/2021  PCP:  Sharilyn Sites, MD   [facility]  Cardiologist:  Werner Lean, MD  Advanced Practice Provider:  No care team member to display Electrophysiologist:  None   HPI    Brendan Scott is a 76 y.o. male with past medical history significant for PAF, CAD, aortic atherosclerosis, hypertension with DM, HLD with DM, COPD, CKD stage IIIa presents today for hospital follow-up appointment.   He was last seen by Dr. Gasper Sells 11/16/2020.  At that time he did not have any chest pain or pressure.  Denied any shortness of breath or DOE.  No weight gain or leg swelling.  No palpitations or syncope.  He did not remember his diagnosis of atrial fibrillation.  Never had tachycardia.  Blood pressure stable.   Today, he states he recently went to the ED but left because the wait time.  He then went to his primary care and was having right-sided shoulder pain.  He was worried this was his heart.  Labs were done and troponin was slightly elevated.  I do not have an EKG from that visit on 10/5.  Since then he has not had any further shoulder pain.  The pain came on when he was mowing the yard.  By the time he got to his primary's office it had resolved.  He was sent home.  He walks almost a mile a day without any pain.  We reviewed his last stress test which was back in 2019.  We also reviewed his last echocardiogram which was back in 2021.  Otherwise, he is doing well from a cardiovascular standpoint.  Reports no shortness of breath nor dyspnea on exertion. Reports no chest pain, pressure, or tightness. No edema, orthopnea, PND. Reports no palpitations.    Past Medical History    Past Medical History:  Diagnosis Date   Aortic atherosclerosis (HCC)    Arthritis    CKD (chronic kidney disease), stage III (HCC)    Coronary artery calcification    Diabetes mellitus    HTN (hypertension)     Hyperlipidemia    Permanent atrial fibrillation (HCC)    Seizure disorder (HCC)    Stroke [facility])    Past Surgical History:  Procedure Laterality Date   TOTAL KNEE ARTHROPLASTY     Bilateral    Allergies  No Known Allergies   EKGs/Labs/Other Studies Reviewed:   The following studies were reviewed today:   Transthoracic Echocardiogram: Date: 01/08/2020 Results: 1. Left ventricular ejection fraction, by estimation, is 60 to 65%. The  left ventricle has normal function. The left ventricle has no regional  wall motion abnormalities. The left ventricular internal cavity size was  mildly dilated. There is mild left  ventricular hypertrophy. Left ventricular diastolic parameters are  indeterminate.   2. Right ventricular systolic function is normal. The right ventricular  size is normal.   3. Left atrial size was severely dilated.   4. Right atrial size was severely dilated.   5. The mitral valve is normal in structure. Moderate mitral valve  regurgitation.   6. Tricuspid valve regurgitation is moderate.   7. The aortic valve is tricuspid. Aortic valve regurgitation is not  visualized.   8. Severe biatrial enlargement with functional MR/TR. Consider  restrictive cardiomyopathy.    CT Aorta Date:08/10/2017 Results: IMPRESSION: 1. No pulmonary emboli. 2. Mild atherosclerotic changes in the thoracic aorta. Coronary artery calcifications.  3. Mild emphysematous changes in the lung apices. 4. No other acute abnormalities.  EKG:  EKG is  ordered today.  The ekg ordered today demonstrates sinus bradycardia, rate 45 bpm.  Recent Labs: No results found for requested labs within last 365 days.  Recent Lipid Panel    Component Value Date/Time   CHOL 184 12/16/2011 0430   TRIG 51 12/16/2011 0430   HDL 60 12/16/2011 0430   CHOLHDL 3.1 12/16/2011 0430   VLDL 10 12/16/2011 0430   LDLCALC 114 (H) 12/16/2011 0430    Risk Assessment/Calculations: CHA2DS2-VASc Score = 6   This  indicates a 9.7% annual risk of stroke. The patient's score is based upon: CHF History: 0 HTN History: 1 Diabetes History: 1 Stroke History: 2 Vascular Disease History: 0 Age Score: 2 Gender Score: 0       Home Medications   Current Meds  Medication Sig   amLODipine (NORVASC) 10 MG tablet Take 10 mg by mouth daily.   aspirin EC 81 MG tablet Take 81 mg by mouth daily. Swallow whole.   atorvastatin (LIPITOR) 40 MG tablet TAKE 1 TABLET BY MOUTH ONCE A DAY.   chlorthalidone (HYGROTON) 25 MG tablet Take 12.5 mg by mouth daily.   glimepiride (AMARYL) 2 MG tablet Take 3 tablets by mouth daily with breakfast.   HYDROcodone-acetaminophen (NORCO/VICODIN) 5-325 MG per tablet Take 1 tablet by mouth every 6 (six) hours as needed for moderate pain.   losartan (COZAAR) 100 MG tablet Take 1 tablet by mouth daily.   Rivaroxaban (XARELTO) 15 MG TABS tablet Take 1 tablet (15 mg total) by mouth daily.   sodium bicarbonate 650 MG tablet Take 1 tablet by mouth in the morning, at noon, and at bedtime.   terazosin (HYTRIN) 2 MG capsule Take 2 mg by mouth at bedtime.     Review of Systems      All other systems reviewed and are otherwise negative except as noted above.  Physical Exam    VS:  BP 122/70   Pulse (!) 45   Ht '5\' 5"'$  (1.651 m)   Wt 213 lb 12.8 oz (97 kg)   SpO2 99%   BMI 35.58 kg/m  , BMI Body mass index is 35.58 kg/m.  Wt Readings from Last 3 Encounters:  11/21/21 213 lb 12.8 oz (97 kg)  09/08/21 215 lb (97.5 kg)  03/03/21 213 lb (96.6 kg)     GEN: Well nourished, well developed, in no acute distress. HEENT: normal. Neck: Supple, no JVD, carotid bruits, or masses. Cardiac: sinus bradycardia, no murmurs, rubs, or gallops. No clubbing, cyanosis, edema.  Radials/PT 2+ and equal bilaterally.  Respiratory:  Respirations regular and unlabored, clear to auscultation bilaterally. GI: Soft, nontender, nondistended. MS: No deformity or atrophy. Skin: Warm and dry, no rash. Neuro:   Strength and sensation are intact. Psych: Normal affect.  Assessment & Plan    Permanent atrial fibrillation/sinus bradycardia -sinus bradycardia today, rate 45 bpm -avoiding nodal agents -asymptomatic -continue xarelto  Right-sided shoulder pain -Troponin was slightly elevated (which can occur for many reasons) -I am not convinced that this was cardiac in nature -It has not reoccurred since 10/5 -If he notices right-sided shoulder pain that increases with exercise and subsides with rest and he is to call our office or if he is to have any chest pain -Would not pursue an ischemic work-up at this time.  We will order an echocardiogram for further work-up  Nonrheumatic mitral valve regurgitation (moderate) -will update echo  Stage IIIa chronic kidney disease -Avoiding nephrotoxic medications -Following with nephrology  Hyperlipidemia -April 2024, labs will be due -Recent labs reviewed and LDL 68  Aortic atherosclerosis -continue lipitor  Hypertension with associated diabetes -7.1 A1C, per PCP    Disposition: Follow up 1 year or sooner if needed with Werner Lean, MD or APP.  Signed, Elgie Collard, PA-C 11/21/2021, 2:15 PM  [facility]

## 2021-11-21 ENCOUNTER — Ambulatory Visit: Payer: Medicare HMO | Attending: Physician Assistant | Admitting: Physician Assistant

## 2021-11-21 ENCOUNTER — Encounter: Payer: Self-pay | Admitting: Physician Assistant

## 2021-11-21 VITALS — BP 122/70 | HR 45 | Ht 65.0 in | Wt 213.8 lb

## 2021-11-21 DIAGNOSIS — I700 Atherosclerosis of aorta: Secondary | ICD-10-CM | POA: Diagnosis not present

## 2021-11-21 DIAGNOSIS — E1159 Type 2 diabetes mellitus with other circulatory complications: Secondary | ICD-10-CM | POA: Diagnosis not present

## 2021-11-21 DIAGNOSIS — J449 Chronic obstructive pulmonary disease, unspecified: Secondary | ICD-10-CM | POA: Diagnosis not present

## 2021-11-21 DIAGNOSIS — E785 Hyperlipidemia, unspecified: Secondary | ICD-10-CM | POA: Diagnosis not present

## 2021-11-21 DIAGNOSIS — I4821 Permanent atrial fibrillation: Secondary | ICD-10-CM | POA: Diagnosis not present

## 2021-11-21 DIAGNOSIS — E1169 Type 2 diabetes mellitus with other specified complication: Secondary | ICD-10-CM | POA: Diagnosis not present

## 2021-11-21 DIAGNOSIS — N1831 Chronic kidney disease, stage 3a: Secondary | ICD-10-CM

## 2021-11-21 DIAGNOSIS — I152 Hypertension secondary to endocrine disorders: Secondary | ICD-10-CM

## 2021-11-21 DIAGNOSIS — I340 Nonrheumatic mitral (valve) insufficiency: Secondary | ICD-10-CM | POA: Diagnosis not present

## 2021-11-21 NOTE — Patient Instructions (Addendum)
Medication Instructions:  Your physician recommends that you continue on your current medications as directed. Please refer to the Current Medication list given to you today.  *If you need a refill on your cardiac medications before your next appointment, please call your pharmacy*   Lab Work: None If you have labs (blood work) drawn today and your tests are completely normal, you will receive your results only by: [place] (if you have MyChart) OR A paper copy in the mail If you have any lab test that is abnormal or we need to change your treatment, we will call you to review the results.  Testing: Your physician has requested that you have an echocardiogram. Echocardiography is a painless test that uses sound waves to create images of your heart. It provides your doctor with information about the size and shape of your heart and how well your heart's chambers and valves are working. This procedure takes approximately one hour. There are no restrictions for this procedure. Please do NOT wear cologne, perfume, aftershave, or lotions (deodorant is allowed). Please arrive 15 minutes prior to your appointment time.    Follow-Up: At [facility], you and your health needs are our priority.  As part of our continuing mission to provide you with exceptional heart care, we have created designated Provider Care Teams.  These Care Teams include your primary Cardiologist (physician) and Advanced Practice Providers (APPs -  Physician Assistants and Nurse Practitioners) who all work together to provide you with the care you need, when you need it.   Your next appointment:   1 year(s)  The format for your next appointment:   In Person  Provider:   Werner Lean, MD    Important Information About Sugar

## 2021-11-22 DIAGNOSIS — M79676 Pain in unspecified toe(s): Secondary | ICD-10-CM | POA: Diagnosis not present

## 2021-11-22 DIAGNOSIS — E1142 Type 2 diabetes mellitus with diabetic polyneuropathy: Secondary | ICD-10-CM | POA: Diagnosis not present

## 2021-11-22 DIAGNOSIS — B351 Tinea unguium: Secondary | ICD-10-CM | POA: Diagnosis not present

## 2021-11-22 DIAGNOSIS — L84 Corns and callosities: Secondary | ICD-10-CM | POA: Diagnosis not present

## 2021-11-28 ENCOUNTER — Ambulatory Visit (HOSPITAL_COMMUNITY)
Admission: RE | Admit: 2021-11-28 | Discharge: 2021-11-28 | Disposition: A | Discharge status: Home / Self Care | Payer: Medicare HMO | Source: Ambulatory Visit | Attending: Physician Assistant | Admitting: Physician Assistant

## 2021-11-28 DIAGNOSIS — I340 Nonrheumatic mitral (valve) insufficiency: Secondary | ICD-10-CM | POA: Insufficient documentation

## 2021-11-28 LAB — ECHOCARDIOGRAM COMPLETE
AR max vel: 2.19 cm2
AV Area VTI: 2.26 cm2
AV Area mean vel: 2.16 cm2
AV Mean grad: 4 mmHg
AV Peak grad: 9.6 mmHg
Ao pk vel: 1.55 m/s
Area-P 1/2: 3.11 cm2
MV VTI: 2.3 cm2
S' Lateral: 3.4 cm

## 2021-11-28 NOTE — Progress Notes (Signed)
*  PRELIMINARY RESULTS* Echocardiogram 2D Echocardiogram has been performed.  Elpidio Anis 11/28/2021, 11:05 AM

## 2021-11-30 ENCOUNTER — Telehealth: Payer: Self-pay | Admitting: Physician Assistant

## 2021-11-30 NOTE — Telephone Encounter (Signed)
Pt spouse calling about echo results

## 2021-11-30 NOTE — Telephone Encounter (Signed)
Pts granddaughter and wife (on [place]) advised his Echo results and verbalized understanding and will keep an eye on his BP and record the readings a few times per week and let us know if he is having any difficulties.

## 2021-12-02 DIAGNOSIS — R809 Proteinuria, unspecified: Secondary | ICD-10-CM | POA: Diagnosis not present

## 2021-12-02 DIAGNOSIS — E1122 Type 2 diabetes mellitus with diabetic chronic kidney disease: Secondary | ICD-10-CM | POA: Diagnosis not present

## 2021-12-02 DIAGNOSIS — E1129 Type 2 diabetes mellitus with other diabetic kidney complication: Secondary | ICD-10-CM | POA: Diagnosis not present

## 2021-12-02 DIAGNOSIS — E8722 Chronic metabolic acidosis: Secondary | ICD-10-CM | POA: Diagnosis not present

## 2021-12-02 DIAGNOSIS — Z6835 Body mass index (BMI) 35.0-35.9, adult: Secondary | ICD-10-CM | POA: Diagnosis not present

## 2021-12-02 DIAGNOSIS — I129 Hypertensive chronic kidney disease with stage 1 through stage 4 chronic kidney disease, or unspecified chronic kidney disease: Secondary | ICD-10-CM | POA: Diagnosis not present

## 2021-12-02 DIAGNOSIS — N189 Chronic kidney disease, unspecified: Secondary | ICD-10-CM | POA: Diagnosis not present

## 2021-12-27 DIAGNOSIS — I4891 Unspecified atrial fibrillation: Secondary | ICD-10-CM | POA: Diagnosis not present

## 2021-12-27 DIAGNOSIS — I10 Essential (primary) hypertension: Secondary | ICD-10-CM | POA: Diagnosis not present

## 2021-12-27 DIAGNOSIS — G894 Chronic pain syndrome: Secondary | ICD-10-CM | POA: Diagnosis not present

## 2021-12-27 DIAGNOSIS — E1159 Type 2 diabetes mellitus with other circulatory complications: Secondary | ICD-10-CM | POA: Diagnosis not present

## 2021-12-27 DIAGNOSIS — N183 Chronic kidney disease, stage 3 unspecified: Secondary | ICD-10-CM | POA: Diagnosis not present

## 2021-12-27 DIAGNOSIS — E6609 Other obesity due to excess calories: Secondary | ICD-10-CM | POA: Diagnosis not present

## 2021-12-27 DIAGNOSIS — Z6835 Body mass index (BMI) 35.0-35.9, adult: Secondary | ICD-10-CM | POA: Diagnosis not present

## 2021-12-27 DIAGNOSIS — M1991 Primary osteoarthritis, unspecified site: Secondary | ICD-10-CM | POA: Diagnosis not present

## 2022-02-06 DIAGNOSIS — E1129 Type 2 diabetes mellitus with other diabetic kidney complication: Secondary | ICD-10-CM | POA: Diagnosis not present

## 2022-02-06 DIAGNOSIS — I129 Hypertensive chronic kidney disease with stage 1 through stage 4 chronic kidney disease, or unspecified chronic kidney disease: Secondary | ICD-10-CM | POA: Diagnosis not present

## 2022-02-06 DIAGNOSIS — R809 Proteinuria, unspecified: Secondary | ICD-10-CM | POA: Diagnosis not present

## 2022-02-06 DIAGNOSIS — N189 Chronic kidney disease, unspecified: Secondary | ICD-10-CM | POA: Diagnosis not present

## 2022-02-06 DIAGNOSIS — E1122 Type 2 diabetes mellitus with diabetic chronic kidney disease: Secondary | ICD-10-CM | POA: Diagnosis not present

## 2022-02-06 DIAGNOSIS — R7303 Prediabetes: Secondary | ICD-10-CM | POA: Diagnosis not present

## 2022-02-10 DIAGNOSIS — N189 Chronic kidney disease, unspecified: Secondary | ICD-10-CM | POA: Diagnosis not present

## 2022-02-10 DIAGNOSIS — E1129 Type 2 diabetes mellitus with other diabetic kidney complication: Secondary | ICD-10-CM | POA: Diagnosis not present

## 2022-02-10 DIAGNOSIS — I129 Hypertensive chronic kidney disease with stage 1 through stage 4 chronic kidney disease, or unspecified chronic kidney disease: Secondary | ICD-10-CM | POA: Diagnosis not present

## 2022-02-10 DIAGNOSIS — D638 Anemia in other chronic diseases classified elsewhere: Secondary | ICD-10-CM | POA: Diagnosis not present

## 2022-02-10 DIAGNOSIS — R809 Proteinuria, unspecified: Secondary | ICD-10-CM | POA: Diagnosis not present

## 2022-02-10 DIAGNOSIS — E1122 Type 2 diabetes mellitus with diabetic chronic kidney disease: Secondary | ICD-10-CM | POA: Diagnosis not present

## 2022-02-21 DIAGNOSIS — G40109 Localization-related (focal) (partial) symptomatic epilepsy and epileptic syndromes with simple partial seizures, not intractable, without status epilepticus: Secondary | ICD-10-CM | POA: Diagnosis not present

## 2022-02-21 DIAGNOSIS — I700 Atherosclerosis of aorta: Secondary | ICD-10-CM | POA: Diagnosis not present

## 2022-02-21 DIAGNOSIS — E1169 Type 2 diabetes mellitus with other specified complication: Secondary | ICD-10-CM | POA: Diagnosis not present

## 2022-02-21 DIAGNOSIS — D329 Benign neoplasm of meninges, unspecified: Secondary | ICD-10-CM | POA: Diagnosis not present

## 2022-02-21 DIAGNOSIS — N1831 Chronic kidney disease, stage 3a: Secondary | ICD-10-CM | POA: Diagnosis not present

## 2022-02-21 DIAGNOSIS — E1129 Type 2 diabetes mellitus with other diabetic kidney complication: Secondary | ICD-10-CM | POA: Diagnosis not present

## 2022-02-21 DIAGNOSIS — E1159 Type 2 diabetes mellitus with other circulatory complications: Secondary | ICD-10-CM | POA: Diagnosis not present

## 2022-02-21 DIAGNOSIS — G8190 Hemiplegia, unspecified affecting unspecified side: Secondary | ICD-10-CM | POA: Diagnosis not present

## 2022-02-21 DIAGNOSIS — I4891 Unspecified atrial fibrillation: Secondary | ICD-10-CM | POA: Diagnosis not present

## 2022-02-28 DIAGNOSIS — E1142 Type 2 diabetes mellitus with diabetic polyneuropathy: Secondary | ICD-10-CM | POA: Diagnosis not present

## 2022-02-28 DIAGNOSIS — M79676 Pain in unspecified toe(s): Secondary | ICD-10-CM | POA: Diagnosis not present

## 2022-02-28 DIAGNOSIS — B351 Tinea unguium: Secondary | ICD-10-CM | POA: Diagnosis not present

## 2022-02-28 DIAGNOSIS — L84 Corns and callosities: Secondary | ICD-10-CM | POA: Diagnosis not present

## 2022-03-13 ENCOUNTER — Ambulatory Visit: Payer: Medicare HMO | Admitting: Urology

## 2022-03-24 ENCOUNTER — Encounter: Payer: Self-pay | Admitting: Urology

## 2022-03-24 ENCOUNTER — Other Ambulatory Visit: Payer: Self-pay

## 2022-03-24 ENCOUNTER — Ambulatory Visit (INDEPENDENT_AMBULATORY_CARE_PROVIDER_SITE_OTHER): Payer: Medicare HMO | Admitting: Urology

## 2022-03-24 VITALS — BP 118/67 | HR 64

## 2022-03-24 DIAGNOSIS — R350 Frequency of micturition: Secondary | ICD-10-CM | POA: Diagnosis not present

## 2022-03-24 DIAGNOSIS — N138 Other obstructive and reflux uropathy: Secondary | ICD-10-CM | POA: Diagnosis not present

## 2022-03-24 DIAGNOSIS — N401 Enlarged prostate with lower urinary tract symptoms: Secondary | ICD-10-CM

## 2022-03-24 DIAGNOSIS — R972 Elevated prostate specific antigen [PSA]: Secondary | ICD-10-CM | POA: Diagnosis not present

## 2022-03-24 LAB — URINALYSIS, ROUTINE W REFLEX MICROSCOPIC
Bilirubin, UA: NEGATIVE
Glucose, UA: NEGATIVE
Ketones, UA: NEGATIVE
Leukocytes,UA: NEGATIVE
Nitrite, UA: NEGATIVE
Protein,UA: NEGATIVE
Specific Gravity, UA: 1.015 (ref 1.005–1.030)
Urobilinogen, Ur: 1 mg/dL (ref 0.2–1.0)
pH, UA: 7 (ref 5.0–7.5)

## 2022-03-24 LAB — MICROSCOPIC EXAMINATION: RBC, Urine: >30 /hpf — AB (ref 0–2)

## 2022-03-24 MED ORDER — TERAZOSIN HCL 2 MG PO CAPS: 2.0000 mg | ORAL_CAPSULE | Freq: Every day | ORAL | 3 refills | Status: DC

## 2022-03-24 NOTE — Patient Instructions (Signed)

## 2022-03-24 NOTE — Progress Notes (Signed)
03/24/2022 9:45 AM   Virgina Organ [birth year] [id number]  Referring provider: Sharilyn Sites, MD [address] [place],  [place] [address]  No chief complaint on file.   HPI: Mr Funaro is a 77yo here for followup for elevated PSA and BPH. IPSS 17 QOl 3 on terazosin. Nocturia 2-3x. Urine stream fair. No recent PSA   PMH: Past Medical History:  Diagnosis Date   Aortic atherosclerosis ([place])    Arthritis    CKD (chronic kidney disease), stage III (HCC)    Coronary artery calcification    Diabetes mellitus    HTN (hypertension)    Hyperlipidemia    Permanent atrial fibrillation (HCC)    Seizure disorder (HCC)    Stroke [facility])     Surgical History: Past Surgical History:  Procedure Laterality Date   TOTAL KNEE ARTHROPLASTY     Bilateral    Home Medications:  Allergies as of 03/24/2022   No Known Allergies      Medication List        Accurate as of March 24, 2022  9:45 AM. If you have any questions, ask your nurse or doctor.          amLODipine 10 MG tablet Commonly known as: NORVASC Take 10 mg by mouth daily.   aspirin EC 81 MG tablet Take 81 mg by mouth daily. Swallow whole.   atorvastatin 40 MG tablet Commonly known as: LIPITOR TAKE 1 TABLET BY MOUTH ONCE A DAY.   chlorthalidone 25 MG tablet Commonly known as: HYGROTON Take 12.5 mg by mouth daily.   glimepiride 2 MG tablet Commonly known as: AMARYL Take 3 tablets by mouth daily with breakfast.   HYDROcodone-acetaminophen 5-325 MG tablet Commonly known as: NORCO/VICODIN Take 1 tablet by mouth every 6 (six) hours as needed for moderate pain.   losartan 100 MG tablet Commonly known as: COZAAR Take 1 tablet by mouth daily.   sodium bicarbonate 650 MG tablet Take 1 tablet by mouth in the morning, at noon, and at bedtime.   terazosin 2 MG capsule Commonly known as: HYTRIN Take 2 mg by mouth at bedtime.   Xarelto 15 MG Tabs tablet Generic drug: Rivaroxaban Take 1 tablet (15 mg total)  by mouth daily.        Allergies: No Known Allergies  Family History: Family History  Problem Relation Age of Onset   Arthritis Other    Diabetes Other     Social History:  reports that he quit smoking about 44 years ago. His smoking use included cigarettes. He started smoking about 59 years ago. He has never used smokeless tobacco. He reports that he does not drink alcohol and does not use drugs.  ROS: All other review of systems were reviewed and are negative except what is noted above in HPI  Physical Exam: BP 118/67   Pulse 64   Constitutional:  Alert and oriented, No acute distress. HEENT: [place] AT, moist mucus membranes.  Trachea midline, no masses. Cardiovascular: No clubbing, cyanosis, or edema. Respiratory: Normal respiratory effort, no increased work of breathing. GI: Abdomen is soft, nontender, nondistended, no abdominal masses GU: No CVA tenderness.  Lymph: No cervical or inguinal lymphadenopathy. Skin: No rashes, bruises or suspicious lesions. Neurologic: Grossly intact, no focal deficits, moving all 4 extremities. Psychiatric: Normal mood and affect.  Laboratory Data: Lab Results  Component Value Date   WBC 5.7 11/16/2020   HGB 13.5 11/16/2020   HCT 43.4 11/16/2020   MCV 86.3 11/16/2020   PLT  156 11/16/2020    Lab Results  Component Value Date   CREATININE 2.06 (H) 11/16/2020    No results found for: "PSA"  No results found for: "TESTOSTERONE"  Lab Results  Component Value Date   HGBA1C 6.9 (H) 12/16/2011    Urinalysis    Component Value Date/Time   COLORURINE YELLOW 12/24/2011 1200   APPEARANCEUR Clear 09/08/2021 1005   LABSPEC 1.016 12/24/2011 1200   PHURINE 8.5 (H) 12/24/2011 1200   GLUCOSEU Negative 09/08/2021 1005   HGBUR NEGATIVE 12/24/2011 1200   BILIRUBINUR Negative 09/08/2021 1005   KETONESUR NEGATIVE 12/24/2011 1200   PROTEINUR Negative 09/08/2021 1005   PROTEINUR NEGATIVE 12/24/2011 1200   UROBILINOGEN 1.0 12/24/2011 1200    NITRITE Negative 09/08/2021 1005   NITRITE NEGATIVE 12/24/2011 1200   LEUKOCYTESUR Negative 09/08/2021 1005    Lab Results  Component Value Date   LABMICR Comment 09/08/2021   WBCUA None seen 12/17/2020   LABEPIT None seen 12/17/2020   MUCUS Present 12/17/2020   BACTERIA None seen 12/17/2020    Pertinent Imaging:  No results found for this or any previous visit.  No results found for this or any previous visit.  No results found for this or any previous visit.  No results found for this or any previous visit.  Results for orders placed during the hospital encounter of 07/29/15  US Renal  Narrative CLINICAL DATA:  Chronic renal insufficiency stage III, history of diabetes, atrial fibrillation  EXAM: RENAL / URINARY TRACT ULTRASOUND COMPLETE  COMPARISON:  None in PACs  FINDINGS: Right Kidney:  Length: 10.8 cm. The renal cortical echotexture is mildly increased but remains lower than that of the adjacent liver. There is no parenchymal mass or hydronephrosis.  Left Kidney:  Length: 10.3 cm. The renal cortical echotexture is similar to that on the right. There is no focal mass or hydronephrosis.  Bladder:  Appears normal for degree of bladder distention. Bilateral ureteral jets are observed.  IMPRESSION: Mild increased renal cortical echotexture consistent with medical renal disease. There is no renal mass or hydronephrosis.   Electronically Signed By: David  Martinique M.D. On: 07/29/2015 12:51  No valid procedures specified. No results found for this or any previous visit.  No results found for this or any previous visit.   Assessment & Plan:    1. BPH with obstruction/lower urinary tract symptoms -continue terazosin '2mg'$  qhs - Urinalysis, Routine w reflex microscopic  2. Elevated PSA -PSa today -followup 6 months with PSA  3. Urinary frequency -continue terazosin '2mg'$  qhs   No follow-ups on file.  Nicolette Bang, MD  [facility]  [place]

## 2022-03-25 LAB — PSA, TOTAL AND FREE
PSA, Free Pct: 43.9 %
PSA, Free: 1.23 ng/mL
Prostate Specific Ag, Serum: 2.8 ng/mL (ref 0.0–4.0)

## 2022-03-29 ENCOUNTER — Telehealth: Payer: Self-pay

## 2022-03-29 NOTE — Telephone Encounter (Signed)
Patients wife called advising her and the patient had questions regarding recent blood work. They would like a call back to discuss the results.   Thank you

## 2022-03-29 NOTE — Telephone Encounter (Signed)
Do you have results from MD on lab work on 02/25?

## 2022-03-30 NOTE — Telephone Encounter (Signed)
Wife called and made aware of PSA and UA results.

## 2022-04-20 DIAGNOSIS — E6609 Other obesity due to excess calories: Secondary | ICD-10-CM | POA: Diagnosis not present

## 2022-04-20 DIAGNOSIS — G894 Chronic pain syndrome: Secondary | ICD-10-CM | POA: Diagnosis not present

## 2022-04-20 DIAGNOSIS — R0600 Dyspnea, unspecified: Secondary | ICD-10-CM | POA: Diagnosis not present

## 2022-04-20 DIAGNOSIS — Z6835 Body mass index (BMI) 35.0-35.9, adult: Secondary | ICD-10-CM | POA: Diagnosis not present

## 2022-05-01 DIAGNOSIS — E1122 Type 2 diabetes mellitus with diabetic chronic kidney disease: Secondary | ICD-10-CM | POA: Diagnosis not present

## 2022-05-01 DIAGNOSIS — N1831 Chronic kidney disease, stage 3a: Secondary | ICD-10-CM | POA: Diagnosis not present

## 2022-05-01 DIAGNOSIS — E1129 Type 2 diabetes mellitus with other diabetic kidney complication: Secondary | ICD-10-CM | POA: Diagnosis not present

## 2022-05-01 DIAGNOSIS — E559 Vitamin D deficiency, unspecified: Secondary | ICD-10-CM | POA: Diagnosis not present

## 2022-05-01 DIAGNOSIS — Z125 Encounter for screening for malignant neoplasm of prostate: Secondary | ICD-10-CM | POA: Diagnosis not present

## 2022-05-01 DIAGNOSIS — I639 Cerebral infarction, unspecified: Secondary | ICD-10-CM | POA: Diagnosis not present

## 2022-05-01 DIAGNOSIS — Z0001 Encounter for general adult medical examination with abnormal findings: Secondary | ICD-10-CM | POA: Diagnosis not present

## 2022-05-01 DIAGNOSIS — Z6835 Body mass index (BMI) 35.0-35.9, adult: Secondary | ICD-10-CM | POA: Diagnosis not present

## 2022-05-01 DIAGNOSIS — Z1331 Encounter for screening for depression: Secondary | ICD-10-CM | POA: Diagnosis not present

## 2022-05-01 DIAGNOSIS — D518 Other vitamin B12 deficiency anemias: Secondary | ICD-10-CM | POA: Diagnosis not present

## 2022-05-01 DIAGNOSIS — G9332 Myalgic encephalomyelitis/chronic fatigue syndrome: Secondary | ICD-10-CM | POA: Diagnosis not present

## 2022-05-01 DIAGNOSIS — E6609 Other obesity due to excess calories: Secondary | ICD-10-CM | POA: Diagnosis not present

## 2022-05-01 DIAGNOSIS — I700 Atherosclerosis of aorta: Secondary | ICD-10-CM | POA: Diagnosis not present

## 2022-05-01 DIAGNOSIS — G8190 Hemiplegia, unspecified affecting unspecified side: Secondary | ICD-10-CM | POA: Diagnosis not present

## 2022-05-09 DIAGNOSIS — L84 Corns and callosities: Secondary | ICD-10-CM | POA: Diagnosis not present

## 2022-05-09 DIAGNOSIS — B351 Tinea unguium: Secondary | ICD-10-CM | POA: Diagnosis not present

## 2022-05-09 DIAGNOSIS — E1142 Type 2 diabetes mellitus with diabetic polyneuropathy: Secondary | ICD-10-CM | POA: Diagnosis not present

## 2022-05-09 DIAGNOSIS — M79676 Pain in unspecified toe(s): Secondary | ICD-10-CM | POA: Diagnosis not present

## 2022-05-29 DIAGNOSIS — I129 Hypertensive chronic kidney disease with stage 1 through stage 4 chronic kidney disease, or unspecified chronic kidney disease: Secondary | ICD-10-CM | POA: Diagnosis not present

## 2022-05-29 DIAGNOSIS — E1122 Type 2 diabetes mellitus with diabetic chronic kidney disease: Secondary | ICD-10-CM | POA: Diagnosis not present

## 2022-05-29 DIAGNOSIS — E1129 Type 2 diabetes mellitus with other diabetic kidney complication: Secondary | ICD-10-CM | POA: Diagnosis not present

## 2022-05-29 DIAGNOSIS — N189 Chronic kidney disease, unspecified: Secondary | ICD-10-CM | POA: Diagnosis not present

## 2022-05-29 DIAGNOSIS — D638 Anemia in other chronic diseases classified elsewhere: Secondary | ICD-10-CM | POA: Diagnosis not present

## 2022-05-29 DIAGNOSIS — R809 Proteinuria, unspecified: Secondary | ICD-10-CM | POA: Diagnosis not present

## 2022-06-05 DIAGNOSIS — D638 Anemia in other chronic diseases classified elsewhere: Secondary | ICD-10-CM | POA: Diagnosis not present

## 2022-06-05 DIAGNOSIS — N2581 Secondary hyperparathyroidism of renal origin: Secondary | ICD-10-CM | POA: Diagnosis not present

## 2022-06-05 DIAGNOSIS — I4891 Unspecified atrial fibrillation: Secondary | ICD-10-CM | POA: Diagnosis not present

## 2022-06-05 DIAGNOSIS — I129 Hypertensive chronic kidney disease with stage 1 through stage 4 chronic kidney disease, or unspecified chronic kidney disease: Secondary | ICD-10-CM | POA: Diagnosis not present

## 2022-06-05 DIAGNOSIS — R809 Proteinuria, unspecified: Secondary | ICD-10-CM | POA: Diagnosis not present

## 2022-06-05 DIAGNOSIS — E1129 Type 2 diabetes mellitus with other diabetic kidney complication: Secondary | ICD-10-CM | POA: Diagnosis not present

## 2022-06-05 DIAGNOSIS — N1832 Chronic kidney disease, stage 3b: Secondary | ICD-10-CM | POA: Diagnosis not present

## 2022-06-23 DIAGNOSIS — Z6835 Body mass index (BMI) 35.0-35.9, adult: Secondary | ICD-10-CM | POA: Diagnosis not present

## 2022-06-23 DIAGNOSIS — G894 Chronic pain syndrome: Secondary | ICD-10-CM | POA: Diagnosis not present

## 2022-06-23 DIAGNOSIS — E6609 Other obesity due to excess calories: Secondary | ICD-10-CM | POA: Diagnosis not present

## 2022-06-23 DIAGNOSIS — J209 Acute bronchitis, unspecified: Secondary | ICD-10-CM | POA: Diagnosis not present

## 2022-06-30 DIAGNOSIS — E1165 Type 2 diabetes mellitus with hyperglycemia: Secondary | ICD-10-CM | POA: Diagnosis not present

## 2022-06-30 DIAGNOSIS — I10 Essential (primary) hypertension: Secondary | ICD-10-CM | POA: Diagnosis not present

## 2022-06-30 DIAGNOSIS — E1159 Type 2 diabetes mellitus with other circulatory complications: Secondary | ICD-10-CM | POA: Diagnosis not present

## 2022-06-30 DIAGNOSIS — E782 Mixed hyperlipidemia: Secondary | ICD-10-CM | POA: Diagnosis not present

## 2022-07-18 DIAGNOSIS — E1142 Type 2 diabetes mellitus with diabetic polyneuropathy: Secondary | ICD-10-CM | POA: Diagnosis not present

## 2022-07-18 DIAGNOSIS — B351 Tinea unguium: Secondary | ICD-10-CM | POA: Diagnosis not present

## 2022-07-18 DIAGNOSIS — M79676 Pain in unspecified toe(s): Secondary | ICD-10-CM | POA: Diagnosis not present

## 2022-07-18 DIAGNOSIS — L84 Corns and callosities: Secondary | ICD-10-CM | POA: Diagnosis not present

## 2022-08-21 ENCOUNTER — Other Ambulatory Visit (HOSPITAL_COMMUNITY): Payer: Self-pay | Admitting: Internal Medicine

## 2022-08-21 ENCOUNTER — Ambulatory Visit (HOSPITAL_COMMUNITY)
Admission: RE | Admit: 2022-08-21 | Discharge: 2022-08-21 | Disposition: A | Discharge status: Home / Self Care | Payer: Medicare HMO | Source: Ambulatory Visit | Attending: Internal Medicine | Admitting: Internal Medicine

## 2022-08-21 DIAGNOSIS — I10 Essential (primary) hypertension: Secondary | ICD-10-CM | POA: Diagnosis not present

## 2022-08-21 DIAGNOSIS — I4891 Unspecified atrial fibrillation: Secondary | ICD-10-CM | POA: Diagnosis not present

## 2022-08-21 DIAGNOSIS — E6609 Other obesity due to excess calories: Secondary | ICD-10-CM | POA: Diagnosis not present

## 2022-08-21 DIAGNOSIS — G40109 Localization-related (focal) (partial) symptomatic epilepsy and epileptic syndromes with simple partial seizures, not intractable, without status epilepticus: Secondary | ICD-10-CM | POA: Diagnosis not present

## 2022-08-21 DIAGNOSIS — N1831 Chronic kidney disease, stage 3a: Secondary | ICD-10-CM | POA: Diagnosis not present

## 2022-08-21 DIAGNOSIS — R0609 Other forms of dyspnea: Secondary | ICD-10-CM | POA: Diagnosis not present

## 2022-08-21 DIAGNOSIS — E1159 Type 2 diabetes mellitus with other circulatory complications: Secondary | ICD-10-CM | POA: Diagnosis not present

## 2022-08-21 DIAGNOSIS — M1991 Primary osteoarthritis, unspecified site: Secondary | ICD-10-CM | POA: Diagnosis not present

## 2022-08-21 DIAGNOSIS — E1122 Type 2 diabetes mellitus with diabetic chronic kidney disease: Secondary | ICD-10-CM | POA: Diagnosis not present

## 2022-09-06 DIAGNOSIS — D631 Anemia in chronic kidney disease: Secondary | ICD-10-CM | POA: Diagnosis not present

## 2022-09-06 DIAGNOSIS — N183 Chronic kidney disease, stage 3 unspecified: Secondary | ICD-10-CM | POA: Diagnosis not present

## 2022-09-06 DIAGNOSIS — N189 Chronic kidney disease, unspecified: Secondary | ICD-10-CM | POA: Diagnosis not present

## 2022-09-06 DIAGNOSIS — R809 Proteinuria, unspecified: Secondary | ICD-10-CM | POA: Diagnosis not present

## 2022-09-11 ENCOUNTER — Other Ambulatory Visit: Payer: Medicare HMO

## 2022-09-15 ENCOUNTER — Other Ambulatory Visit: Payer: Medicare HMO

## 2022-09-15 DIAGNOSIS — R972 Elevated prostate specific antigen [PSA]: Secondary | ICD-10-CM | POA: Diagnosis not present

## 2022-09-18 DIAGNOSIS — R809 Proteinuria, unspecified: Secondary | ICD-10-CM | POA: Diagnosis not present

## 2022-09-18 DIAGNOSIS — D638 Anemia in other chronic diseases classified elsewhere: Secondary | ICD-10-CM | POA: Diagnosis not present

## 2022-09-18 DIAGNOSIS — E1129 Type 2 diabetes mellitus with other diabetic kidney complication: Secondary | ICD-10-CM | POA: Diagnosis not present

## 2022-09-18 DIAGNOSIS — I129 Hypertensive chronic kidney disease with stage 1 through stage 4 chronic kidney disease, or unspecified chronic kidney disease: Secondary | ICD-10-CM | POA: Diagnosis not present

## 2022-09-22 ENCOUNTER — Ambulatory Visit: Payer: Medicare HMO | Admitting: Urology

## 2022-09-22 VITALS — BP 130/62 | HR 60

## 2022-09-22 DIAGNOSIS — R972 Elevated prostate specific antigen [PSA]: Secondary | ICD-10-CM

## 2022-09-22 DIAGNOSIS — N401 Enlarged prostate with lower urinary tract symptoms: Secondary | ICD-10-CM | POA: Diagnosis not present

## 2022-09-22 DIAGNOSIS — N138 Other obstructive and reflux uropathy: Secondary | ICD-10-CM

## 2022-09-22 DIAGNOSIS — R350 Frequency of micturition: Secondary | ICD-10-CM | POA: Diagnosis not present

## 2022-09-22 LAB — MICROSCOPIC EXAMINATION
Bacteria, UA: NONE SEEN
WBC, UA: NONE SEEN /hpf (ref 0–5)

## 2022-09-22 LAB — URINALYSIS, ROUTINE W REFLEX MICROSCOPIC
Bilirubin, UA: NEGATIVE
Glucose, UA: NEGATIVE
Ketones, UA: NEGATIVE
Leukocytes,UA: NEGATIVE
Nitrite, UA: NEGATIVE
Protein,UA: NEGATIVE
Specific Gravity, UA: 1.02 (ref 1.005–1.030)
Urobilinogen, Ur: 1 mg/dL (ref 0.2–1.0)
pH, UA: 6.5 (ref 5.0–7.5)

## 2022-09-22 NOTE — Progress Notes (Unsigned)
09/22/2022 9:49 AM   Brendan Scott 02/19/1945 [id number]  Referring provider: Assunta Found, MD [address] [place],  [place] [address]  No chief complaint on file.   HPI: PSA 3.2. IPSS 10 QOl 2 on terazosin   PMH: Past Medical History:  Diagnosis Date   Aortic atherosclerosis (HCC)    Arthritis    CKD (chronic kidney disease), stage III (HCC)    Coronary artery calcification    Diabetes mellitus    HTN (hypertension)    Hyperlipidemia    Permanent atrial fibrillation (HCC)    Seizure disorder (HCC)    Stroke [facility])     Surgical History: Past Surgical History:  Procedure Laterality Date   TOTAL KNEE ARTHROPLASTY     Bilateral    Home Medications:  Allergies as of 09/22/2022   No Known Allergies      Medication List        Accurate as of September 22, 2022  9:49 AM. If you have any questions, ask your nurse or doctor.          amLODipine 10 MG tablet Commonly known as: NORVASC Take 10 mg by mouth daily.   aspirin EC 81 MG tablet Take 81 mg by mouth daily. Swallow whole.   atorvastatin 40 MG tablet Commonly known as: LIPITOR TAKE 1 TABLET BY MOUTH ONCE A DAY.   chlorthalidone 25 MG tablet Commonly known as: HYGROTON Take 12.5 mg by mouth daily.   glimepiride 2 MG tablet Commonly known as: AMARYL Take 3 tablets by mouth daily with breakfast.   HYDROcodone-acetaminophen 5-325 MG tablet Commonly known as: NORCO/VICODIN Take 1 tablet by mouth every 6 (six) hours as needed for moderate pain.   losartan 100 MG tablet Commonly known as: COZAAR Take 1 tablet by mouth daily.   sodium bicarbonate 650 MG tablet Take 1 tablet by mouth in the morning, at noon, and at bedtime.   terazosin 2 MG capsule Commonly known as: HYTRIN Take 1 capsule (2 mg total) by mouth at bedtime.   Xarelto 15 MG Tabs tablet Generic drug: Rivaroxaban Take 1 tablet (15 mg total) by mouth daily.        Allergies: No Known Allergies  Family History: Family  History  Problem Relation Age of Onset   Arthritis Other    Diabetes Other     Social History:  reports that he quit smoking about 44 years ago. His smoking use included cigarettes. He started smoking about 59 years ago. He has never used smokeless tobacco. He reports that he does not drink alcohol and does not use drugs.  ROS: All other review of systems were reviewed and are negative except what is noted above in HPI  Physical Exam: BP 130/62   Pulse 60   Constitutional:  Alert and oriented, No acute distress. HEENT: [place] AT, moist mucus membranes.  Trachea midline, no masses. Cardiovascular: No clubbing, cyanosis, or edema. Respiratory: Normal respiratory effort, no increased work of breathing. GI: Abdomen is soft, nontender, nondistended, no abdominal masses GU: No CVA tenderness.  Lymph: No cervical or inguinal lymphadenopathy. Skin: No rashes, bruises or suspicious lesions. Neurologic: Grossly intact, no focal deficits, moving all 4 extremities. Psychiatric: Normal mood and affect.  Laboratory Data: Lab Results  Component Value Date   WBC 5.7 11/16/2020   HGB 13.5 11/16/2020   HCT 43.4 11/16/2020   MCV 86.3 11/16/2020   PLT 156 11/16/2020    Lab Results  Component Value Date   CREATININE 2.06 (H)  11/16/2020    No results found for: "PSA"  No results found for: "TESTOSTERONE"  Lab Results  Component Value Date   HGBA1C 6.9 (H) 12/16/2011    Urinalysis    Component Value Date/Time   COLORURINE YELLOW 12/24/2011 1200   APPEARANCEUR Clear 03/24/2022 0849   LABSPEC 1.016 12/24/2011 1200   PHURINE 8.5 (H) 12/24/2011 1200   GLUCOSEU Negative 03/24/2022 0849   HGBUR NEGATIVE 12/24/2011 1200   BILIRUBINUR Negative 03/24/2022 0849   KETONESUR NEGATIVE 12/24/2011 1200   PROTEINUR Negative 03/24/2022 0849   PROTEINUR NEGATIVE 12/24/2011 1200   UROBILINOGEN 1.0 12/24/2011 1200   NITRITE Negative 03/24/2022 0849   NITRITE NEGATIVE 12/24/2011 1200   LEUKOCYTESUR  Negative 03/24/2022 0849    Lab Results  Component Value Date   LABMICR See below: 03/24/2022   WBCUA 0-5 03/24/2022   LABEPIT 0-10 03/24/2022   MUCUS Present 12/17/2020   BACTERIA Few 03/24/2022    Pertinent Imaging: *** No results found for this or any previous visit.  No results found for this or any previous visit.  No results found for this or any previous visit.  No results found for this or any previous visit.  Results for orders placed during the hospital encounter of 07/29/15  US Renal  Narrative CLINICAL DATA:  Chronic renal insufficiency stage III, history of diabetes, atrial fibrillation  EXAM: RENAL / URINARY TRACT ULTRASOUND COMPLETE  COMPARISON:  None in PACs  FINDINGS: Right Kidney:  Length: 10.8 cm. The renal cortical echotexture is mildly increased but remains lower than that of the adjacent liver. There is no parenchymal mass or hydronephrosis.  Left Kidney:  Length: 10.3 cm. The renal cortical echotexture is similar to that on the right. There is no focal mass or hydronephrosis.  Bladder:  Appears normal for degree of bladder distention. Bilateral ureteral jets are observed.  IMPRESSION: Mild increased renal cortical echotexture consistent with medical renal disease. There is no renal mass or hydronephrosis.   Electronically Signed By: David  [place] M.D. On: 07/29/2015 12:51  No valid procedures specified. No results found for this or any previous visit.  No results found for this or any previous visit.   Assessment & Plan:    1. Elevated PSA Followup 1 year with PSA  2. BPH with obstruction/lower urinary tract symptoms -continue terazosin  3. Urinary frequency ***   No follow-ups on file.  Wilkie Aye, MD  [facility] [place]

## 2022-09-26 DIAGNOSIS — B351 Tinea unguium: Secondary | ICD-10-CM | POA: Diagnosis not present

## 2022-09-26 DIAGNOSIS — L84 Corns and callosities: Secondary | ICD-10-CM | POA: Diagnosis not present

## 2022-09-26 DIAGNOSIS — M79676 Pain in unspecified toe(s): Secondary | ICD-10-CM | POA: Diagnosis not present

## 2022-09-26 DIAGNOSIS — E1142 Type 2 diabetes mellitus with diabetic polyneuropathy: Secondary | ICD-10-CM | POA: Diagnosis not present

## 2022-09-27 ENCOUNTER — Encounter: Payer: Self-pay | Admitting: Urology

## 2022-09-27 NOTE — Patient Instructions (Signed)

## 2022-10-04 ENCOUNTER — Ambulatory Visit: Payer: Medicare HMO | Attending: Internal Medicine | Admitting: Internal Medicine

## 2022-10-04 ENCOUNTER — Encounter: Payer: Self-pay | Admitting: Internal Medicine

## 2022-10-04 VITALS — BP 138/71 | HR 45 | Ht 65.0 in | Wt 216.0 lb

## 2022-10-04 DIAGNOSIS — R0600 Dyspnea, unspecified: Secondary | ICD-10-CM

## 2022-10-04 DIAGNOSIS — Z7901 Long term (current) use of anticoagulants: Secondary | ICD-10-CM | POA: Insufficient documentation

## 2022-10-04 DIAGNOSIS — I340 Nonrheumatic mitral (valve) insufficiency: Secondary | ICD-10-CM | POA: Diagnosis not present

## 2022-10-04 MED ORDER — RIVAROXABAN 15 MG PO TABS: 15.0000 mg | ORAL_TABLET | Freq: Every day | ORAL | 3 refills | Status: DC

## 2022-10-04 NOTE — Progress Notes (Signed)
Cardiology Office Note  Date: 10/04/2022   ID: JAYKO QUIGG, DOB 1945-10-31, MRN [id number]  PCP:  Assunta Found, MD  Cardiologist:  Marjo Bicker, MD Electrophysiologist:  None   History of Present Illness: Brendan Scott is a 77 y.o. male known to have permanent A-fib, HTN, HLD, CKD stage IIIb is here for follow-up visit.  METs more than 4, active at baseline.  Performs yard work, cuts grass and gets SOB but otherwise can perform ADLs, household chores with no symptoms of SOB.  No angina, dizziness, presyncope, syncope, leg swelling and palpitations.  Past Medical History:  Diagnosis Date   Aortic atherosclerosis (HCC)    Arthritis    CKD (chronic kidney disease), stage III (HCC)    Coronary artery calcification    Diabetes mellitus    HTN (hypertension)    Hyperlipidemia    Permanent atrial fibrillation (HCC)    Seizure disorder (HCC)    Stroke [facility])     Past Surgical History:  Procedure Laterality Date   TOTAL KNEE ARTHROPLASTY     Bilateral    Current Outpatient Medications  Medication Sig Dispense Refill   amLODipine (NORVASC) 10 MG tablet Take 10 mg by mouth daily.     atorvastatin (LIPITOR) 40 MG tablet TAKE 1 TABLET BY MOUTH ONCE A DAY. 90 tablet 3   chlorthalidone (HYGROTON) 25 MG tablet Take 12.5 mg by mouth daily.     glimepiride (AMARYL) 2 MG tablet Take 3 tablets by mouth daily with breakfast.     HYDROcodone-acetaminophen (NORCO/VICODIN) 5-325 MG per tablet Take 1 tablet by mouth every 6 (six) hours as needed for moderate pain.     losartan (COZAAR) 100 MG tablet Take 1 tablet by mouth daily.     Rivaroxaban (XARELTO) 15 MG TABS tablet Take 1 tablet (15 mg total) by mouth daily with supper. 30 tablet 3   sodium bicarbonate 650 MG tablet Take 1 tablet by mouth in the morning, at noon, and at bedtime.     terazosin (HYTRIN) 2 MG capsule Take 1 capsule (2 mg total) by mouth at bedtime. 90 capsule 3   No current facility-administered medications for  this visit.   Allergies:  Patient has no known allergies.   Social History: The patient  reports that he quit smoking about 44 years ago. His smoking use included cigarettes. He started smoking about 59 years ago. He has never used smokeless tobacco. He reports that he does not drink alcohol and does not use drugs.   Family History: The patient's family history includes Arthritis in an other family member; Diabetes in an other family member.   ROS:  Please see the history of present illness. Otherwise, complete review of systems is positive for none.  All other systems are reviewed and negative.   Physical Exam: VS:  BP 138/71   Pulse (!) 45   Ht 5\' 5"  (1.651 m)   Wt 216 lb (98 kg)   SpO2 99%   BMI 35.94 kg/m , BMI Body mass index is 35.94 kg/m.  Wt Readings from Last 3 Encounters:  10/04/22 216 lb (98 kg)  11/21/21 213 lb 12.8 oz (97 kg)  09/08/21 215 lb (97.5 kg)    General: Patient appears comfortable at rest. HEENT: Conjunctiva and lids normal, oropharynx clear with moist mucosa. Neck: Supple, no elevated JVP or carotid bruits, no thyromegaly. Lungs: Clear to auscultation, nonlabored breathing at rest. Cardiac: Regular rate and rhythm, no S3 or significant systolic murmur, no  pericardial rub. Abdomen: Soft, nontender, no hepatomegaly, bowel sounds present, no guarding or rebound. Extremities: No pitting edema, distal pulses 2+. Skin: Warm and dry. Musculoskeletal: No kyphosis. Neuropsychiatric: Alert and oriented x3, affect grossly appropriate.  Recent Labwork: No results found for requested labs within last 365 days.     Component Value Date/Time   CHOL 184 12/16/2011 0430   TRIG 51 12/16/2011 0430   HDL 60 12/16/2011 0430   CHOLHDL 3.1 12/16/2011 0430   VLDL 10 12/16/2011 0430   LDLCALC 114 (H) 12/16/2011 0430     Assessment and Plan:   Permanent A-fib Chronic anticoagulation History of embolic stroke to MCA in 2013 Mild to moderate MR, functional CKD  stage IIIb HTN, controlled HLD, at goal   -EKG today showed rate controlled atrial fibrillation, HR 45 bpm.  Asymptomatic, no palpitations, SOB (except with strenuous activities) or fatigue.  No dizziness, presyncope or syncope. Physically active at baseline, more than 4 METS.  Not on rate controlling agents due to HR less than 60 bpm. Currently on Xarelto 20 mg which I will cut back to 15 mg due to reduced creatinine clearance.  Discontinue aspirin 81. -Echo from 10/23 showed normal LVEF, indeterminate diastology, moderate LVH, normal RV, moderate to severe enlargement of RA/LA and mild to moderate MR. Will obtain repeat echocardiogram in 1 year.  PYP scan was previously recommended in 2022 by Dr. Izora Ribas to rule out cardiac amyloidosis (based on moderate LVH and biatrial enlargement), patient refused. He continues to refuse and does not want to drive to [facility] to get the scan. -Continue current antihypertensives, amlodipine 10 mg once daily, chlorthalidone 12.5 mg once daily and losartan 100 mg once daily.  Follows up with PCP. -LDL 59 in 05/2022.  Continue atorvastatin 40 mg p.o. at bedtime, goal LDL less than 70.   Medication Adjustments/Labs and Tests Ordered: Current medicines are reviewed at length with the patient today.  Concerns regarding medicines are outlined above.    Disposition:  Follow up 1 year  Signed, Youcef Klas Verne Spurr, MD, 10/04/2022 2:08 PM    [facility] [address] [place], [place] [address]

## 2022-10-04 NOTE — Patient Instructions (Signed)
Medication Instructions:  Your physician has recommended you make the following change in your medication:   -Stop Aspirin -Decrease Xarelto to 15 mg tablet once daily at supper.  *If you need a refill on your cardiac medications before your next appointment, please call your pharmacy*   Lab Work: None If you have labs (blood work) drawn today and your tests are completely normal, you will receive your results only by: MyChart Message (if you have MyChart) OR A paper copy in the mail If you have any lab test that is abnormal or we need to change your treatment, we will call you to review the results.   Testing/Procedures: Your physician has requested that you have an echocardiogram. Echocardiography is a painless test that uses sound waves to create images of your heart. It provides your doctor with information about the size and shape of your heart and how well your heart's chambers and valves are working. This procedure takes approximately one hour. There are no restrictions for this procedure. Please do NOT wear cologne, perfume, aftershave, or lotions (deodorant is allowed). Please arrive 15 minutes prior to your appointment time.    Follow-Up: At [facility], you and your health needs are our priority.  As part of our continuing mission to provide you with exceptional heart care, we have created designated Provider Care Teams.  These Care Teams include your primary Cardiologist (physician) and Advanced Practice Providers (APPs -  Physician Assistants and Nurse Practitioners) who all work together to provide you with the care you need, when you need it.  We recommend signing up for the patient portal called "MyChart".  Sign up information is provided on this After Visit Summary.  MyChart is used to connect with patients for Virtual Visits (Telemedicine).  Patients are able to view lab/test results, encounter notes, upcoming appointments, etc.  Non-urgent messages can be sent  to your provider as well.   To learn more about what you can do with MyChart, go to [email or web address].    Your next appointment:   1 year(s)  Provider:   You may see Vishnu P Mallipeddi, MD or one of the following Advanced Practice Providers on your designated Care Team:   [place], PA-C  Jacolyn Reedy, [place]      Other Instructions

## 2022-10-30 DIAGNOSIS — I10 Essential (primary) hypertension: Secondary | ICD-10-CM | POA: Diagnosis not present

## 2022-10-30 DIAGNOSIS — I4891 Unspecified atrial fibrillation: Secondary | ICD-10-CM | POA: Diagnosis not present

## 2022-10-30 DIAGNOSIS — E6609 Other obesity due to excess calories: Secondary | ICD-10-CM | POA: Diagnosis not present

## 2022-10-30 DIAGNOSIS — E1159 Type 2 diabetes mellitus with other circulatory complications: Secondary | ICD-10-CM | POA: Diagnosis not present

## 2022-10-30 DIAGNOSIS — E1129 Type 2 diabetes mellitus with other diabetic kidney complication: Secondary | ICD-10-CM | POA: Diagnosis not present

## 2022-10-30 DIAGNOSIS — Z23 Encounter for immunization: Secondary | ICD-10-CM | POA: Diagnosis not present

## 2022-10-30 DIAGNOSIS — Z6835 Body mass index (BMI) 35.0-35.9, adult: Secondary | ICD-10-CM | POA: Diagnosis not present

## 2022-10-30 DIAGNOSIS — G40109 Localization-related (focal) (partial) symptomatic epilepsy and epileptic syndromes with simple partial seizures, not intractable, without status epilepticus: Secondary | ICD-10-CM | POA: Diagnosis not present

## 2022-10-30 DIAGNOSIS — N1831 Chronic kidney disease, stage 3a: Secondary | ICD-10-CM | POA: Diagnosis not present

## 2022-10-30 DIAGNOSIS — M1991 Primary osteoarthritis, unspecified site: Secondary | ICD-10-CM | POA: Diagnosis not present

## 2022-11-03 ENCOUNTER — Other Ambulatory Visit (HOSPITAL_COMMUNITY): Payer: Medicare HMO

## 2022-11-28 DIAGNOSIS — H35033 Hypertensive retinopathy, bilateral: Secondary | ICD-10-CM | POA: Diagnosis not present

## 2022-12-05 DIAGNOSIS — L84 Corns and callosities: Secondary | ICD-10-CM | POA: Diagnosis not present

## 2022-12-05 DIAGNOSIS — B351 Tinea unguium: Secondary | ICD-10-CM | POA: Diagnosis not present

## 2022-12-05 DIAGNOSIS — M79676 Pain in unspecified toe(s): Secondary | ICD-10-CM | POA: Diagnosis not present

## 2022-12-05 DIAGNOSIS — E1142 Type 2 diabetes mellitus with diabetic polyneuropathy: Secondary | ICD-10-CM | POA: Diagnosis not present

## 2022-12-18 DIAGNOSIS — E1129 Type 2 diabetes mellitus with other diabetic kidney complication: Secondary | ICD-10-CM | POA: Diagnosis not present

## 2022-12-18 DIAGNOSIS — D638 Anemia in other chronic diseases classified elsewhere: Secondary | ICD-10-CM | POA: Diagnosis not present

## 2022-12-18 DIAGNOSIS — N189 Chronic kidney disease, unspecified: Secondary | ICD-10-CM | POA: Diagnosis not present

## 2022-12-18 DIAGNOSIS — N1832 Chronic kidney disease, stage 3b: Secondary | ICD-10-CM | POA: Diagnosis not present

## 2022-12-18 DIAGNOSIS — E1122 Type 2 diabetes mellitus with diabetic chronic kidney disease: Secondary | ICD-10-CM | POA: Diagnosis not present

## 2022-12-18 DIAGNOSIS — D649 Anemia, unspecified: Secondary | ICD-10-CM | POA: Diagnosis not present

## 2022-12-18 DIAGNOSIS — I129 Hypertensive chronic kidney disease with stage 1 through stage 4 chronic kidney disease, or unspecified chronic kidney disease: Secondary | ICD-10-CM | POA: Diagnosis not present

## 2022-12-22 DIAGNOSIS — N1831 Chronic kidney disease, stage 3a: Secondary | ICD-10-CM | POA: Diagnosis not present

## 2022-12-22 DIAGNOSIS — E1159 Type 2 diabetes mellitus with other circulatory complications: Secondary | ICD-10-CM | POA: Diagnosis not present

## 2022-12-22 DIAGNOSIS — J209 Acute bronchitis, unspecified: Secondary | ICD-10-CM | POA: Diagnosis not present

## 2022-12-22 DIAGNOSIS — I10 Essential (primary) hypertension: Secondary | ICD-10-CM | POA: Diagnosis not present

## 2022-12-22 DIAGNOSIS — I4891 Unspecified atrial fibrillation: Secondary | ICD-10-CM | POA: Diagnosis not present

## 2022-12-22 DIAGNOSIS — G40109 Localization-related (focal) (partial) symptomatic epilepsy and epileptic syndromes with simple partial seizures, not intractable, without status epilepticus: Secondary | ICD-10-CM | POA: Diagnosis not present

## 2022-12-22 DIAGNOSIS — E1122 Type 2 diabetes mellitus with diabetic chronic kidney disease: Secondary | ICD-10-CM | POA: Diagnosis not present

## 2022-12-22 DIAGNOSIS — M1991 Primary osteoarthritis, unspecified site: Secondary | ICD-10-CM | POA: Diagnosis not present

## 2022-12-22 DIAGNOSIS — E1129 Type 2 diabetes mellitus with other diabetic kidney complication: Secondary | ICD-10-CM | POA: Diagnosis not present

## 2022-12-26 DIAGNOSIS — R809 Proteinuria, unspecified: Secondary | ICD-10-CM | POA: Diagnosis not present

## 2022-12-26 DIAGNOSIS — N1832 Chronic kidney disease, stage 3b: Secondary | ICD-10-CM | POA: Diagnosis not present

## 2022-12-26 DIAGNOSIS — I129 Hypertensive chronic kidney disease with stage 1 through stage 4 chronic kidney disease, or unspecified chronic kidney disease: Secondary | ICD-10-CM | POA: Diagnosis not present

## 2022-12-26 DIAGNOSIS — E1129 Type 2 diabetes mellitus with other diabetic kidney complication: Secondary | ICD-10-CM | POA: Diagnosis not present

## 2023-01-24 ENCOUNTER — Other Ambulatory Visit: Payer: Self-pay | Admitting: Internal Medicine

## 2023-01-24 DIAGNOSIS — I4821 Permanent atrial fibrillation: Secondary | ICD-10-CM

## 2023-01-25 NOTE — Telephone Encounter (Signed)
Prescription refill request for Xarelto received.  Indication: Afib  Last office visit: 10/04/22 (Mallipeddi)  Weight: 98kg Age: 77 Scr: 2.17 (05/29/22)  CrCl: 39.34ml/min  Appropriate dose. Refill sent.

## 2023-01-30 DIAGNOSIS — I700 Atherosclerosis of aorta: Secondary | ICD-10-CM | POA: Diagnosis not present

## 2023-01-30 DIAGNOSIS — I4891 Unspecified atrial fibrillation: Secondary | ICD-10-CM | POA: Diagnosis not present

## 2023-01-30 DIAGNOSIS — E1129 Type 2 diabetes mellitus with other diabetic kidney complication: Secondary | ICD-10-CM | POA: Diagnosis not present

## 2023-01-30 DIAGNOSIS — D6869 Other thrombophilia: Secondary | ICD-10-CM | POA: Diagnosis not present

## 2023-02-09 ENCOUNTER — Telehealth: Payer: Self-pay | Admitting: Internal Medicine

## 2023-02-09 NOTE — Telephone Encounter (Signed)
 Pt c/o medication issue:  1. Name of Medication: Rivaroxaban  (XARELTO ) 15 MG TABS tablet   2. How are you currently taking this medication (dosage and times per day)? TAKE 1 TABLET BY MOUTH DAILY WITH SUPPER   3. Are you having a reaction (difficulty breathing--STAT)? No   4. What is your medication issue? Scott from [facility] said that they are reducing patients medication to 15 MG due to renal issues. Please call back

## 2023-02-12 NOTE — Telephone Encounter (Signed)
 Advised Scott as Robbie Lis that Dr. Jenene Slicker was in agreement to reducing Xarelto dosage to 15 mg tablets once daily d/t renal insufficiency.

## 2023-02-27 DIAGNOSIS — L84 Corns and callosities: Secondary | ICD-10-CM | POA: Diagnosis not present

## 2023-02-27 DIAGNOSIS — E1142 Type 2 diabetes mellitus with diabetic polyneuropathy: Secondary | ICD-10-CM | POA: Diagnosis not present

## 2023-02-27 DIAGNOSIS — M79676 Pain in unspecified toe(s): Secondary | ICD-10-CM | POA: Diagnosis not present

## 2023-02-27 DIAGNOSIS — B351 Tinea unguium: Secondary | ICD-10-CM | POA: Diagnosis not present

## 2023-04-11 DIAGNOSIS — E211 Secondary hyperparathyroidism, not elsewhere classified: Secondary | ICD-10-CM | POA: Diagnosis not present

## 2023-04-11 DIAGNOSIS — D631 Anemia in chronic kidney disease: Secondary | ICD-10-CM | POA: Diagnosis not present

## 2023-04-11 DIAGNOSIS — R809 Proteinuria, unspecified: Secondary | ICD-10-CM | POA: Diagnosis not present

## 2023-04-11 DIAGNOSIS — N189 Chronic kidney disease, unspecified: Secondary | ICD-10-CM | POA: Diagnosis not present

## 2023-04-21 DIAGNOSIS — E1129 Type 2 diabetes mellitus with other diabetic kidney complication: Secondary | ICD-10-CM | POA: Diagnosis not present

## 2023-04-21 DIAGNOSIS — N1832 Chronic kidney disease, stage 3b: Secondary | ICD-10-CM | POA: Diagnosis not present

## 2023-04-21 DIAGNOSIS — I129 Hypertensive chronic kidney disease with stage 1 through stage 4 chronic kidney disease, or unspecified chronic kidney disease: Secondary | ICD-10-CM | POA: Diagnosis not present

## 2023-04-21 DIAGNOSIS — R809 Proteinuria, unspecified: Secondary | ICD-10-CM | POA: Diagnosis not present

## 2023-05-08 DIAGNOSIS — B351 Tinea unguium: Secondary | ICD-10-CM | POA: Diagnosis not present

## 2023-05-08 DIAGNOSIS — M79675 Pain in left toe(s): Secondary | ICD-10-CM | POA: Diagnosis not present

## 2023-05-08 DIAGNOSIS — L84 Corns and callosities: Secondary | ICD-10-CM | POA: Diagnosis not present

## 2023-05-08 DIAGNOSIS — M79674 Pain in right toe(s): Secondary | ICD-10-CM | POA: Diagnosis not present

## 2023-05-08 DIAGNOSIS — E1142 Type 2 diabetes mellitus with diabetic polyneuropathy: Secondary | ICD-10-CM | POA: Diagnosis not present

## 2023-05-10 DIAGNOSIS — Z6835 Body mass index (BMI) 35.0-35.9, adult: Secondary | ICD-10-CM | POA: Diagnosis not present

## 2023-05-10 DIAGNOSIS — M1991 Primary osteoarthritis, unspecified site: Secondary | ICD-10-CM | POA: Diagnosis not present

## 2023-05-10 DIAGNOSIS — I10 Essential (primary) hypertension: Secondary | ICD-10-CM | POA: Diagnosis not present

## 2023-05-10 DIAGNOSIS — G40109 Localization-related (focal) (partial) symptomatic epilepsy and epileptic syndromes with simple partial seizures, not intractable, without status epilepticus: Secondary | ICD-10-CM | POA: Diagnosis not present

## 2023-05-10 DIAGNOSIS — E6609 Other obesity due to excess calories: Secondary | ICD-10-CM | POA: Diagnosis not present

## 2023-05-10 DIAGNOSIS — E538 Deficiency of other specified B group vitamins: Secondary | ICD-10-CM | POA: Diagnosis not present

## 2023-05-10 DIAGNOSIS — N1831 Chronic kidney disease, stage 3a: Secondary | ICD-10-CM | POA: Diagnosis not present

## 2023-05-10 DIAGNOSIS — E1129 Type 2 diabetes mellitus with other diabetic kidney complication: Secondary | ICD-10-CM | POA: Diagnosis not present

## 2023-05-10 DIAGNOSIS — Z0001 Encounter for general adult medical examination with abnormal findings: Secondary | ICD-10-CM | POA: Diagnosis not present

## 2023-05-10 DIAGNOSIS — E1159 Type 2 diabetes mellitus with other circulatory complications: Secondary | ICD-10-CM | POA: Diagnosis not present

## 2023-05-10 DIAGNOSIS — Z9229 Personal history of other drug therapy: Secondary | ICD-10-CM | POA: Diagnosis not present

## 2023-05-10 DIAGNOSIS — Z125 Encounter for screening for malignant neoplasm of prostate: Secondary | ICD-10-CM | POA: Diagnosis not present

## 2023-05-10 DIAGNOSIS — Z1331 Encounter for screening for depression: Secondary | ICD-10-CM | POA: Diagnosis not present

## 2023-06-21 DIAGNOSIS — E6609 Other obesity due to excess calories: Secondary | ICD-10-CM | POA: Diagnosis not present

## 2023-06-21 DIAGNOSIS — Z6835 Body mass index (BMI) 35.0-35.9, adult: Secondary | ICD-10-CM | POA: Diagnosis not present

## 2023-06-21 DIAGNOSIS — G894 Chronic pain syndrome: Secondary | ICD-10-CM | POA: Diagnosis not present

## 2023-07-24 DIAGNOSIS — L84 Corns and callosities: Secondary | ICD-10-CM | POA: Diagnosis not present

## 2023-07-24 DIAGNOSIS — E1142 Type 2 diabetes mellitus with diabetic polyneuropathy: Secondary | ICD-10-CM | POA: Diagnosis not present

## 2023-07-24 DIAGNOSIS — M79675 Pain in left toe(s): Secondary | ICD-10-CM | POA: Diagnosis not present

## 2023-07-24 DIAGNOSIS — M79674 Pain in right toe(s): Secondary | ICD-10-CM | POA: Diagnosis not present

## 2023-07-24 DIAGNOSIS — B351 Tinea unguium: Secondary | ICD-10-CM | POA: Diagnosis not present

## 2023-08-20 DIAGNOSIS — I4891 Unspecified atrial fibrillation: Secondary | ICD-10-CM | POA: Diagnosis not present

## 2023-08-20 DIAGNOSIS — R0609 Other forms of dyspnea: Secondary | ICD-10-CM | POA: Diagnosis not present

## 2023-08-20 DIAGNOSIS — G894 Chronic pain syndrome: Secondary | ICD-10-CM | POA: Diagnosis not present

## 2023-08-20 DIAGNOSIS — Z6834 Body mass index (BMI) 34.0-34.9, adult: Secondary | ICD-10-CM | POA: Diagnosis not present

## 2023-08-20 DIAGNOSIS — I10 Essential (primary) hypertension: Secondary | ICD-10-CM | POA: Diagnosis not present

## 2023-08-20 DIAGNOSIS — E1129 Type 2 diabetes mellitus with other diabetic kidney complication: Secondary | ICD-10-CM | POA: Diagnosis not present

## 2023-08-20 DIAGNOSIS — M1991 Primary osteoarthritis, unspecified site: Secondary | ICD-10-CM | POA: Diagnosis not present

## 2023-08-20 DIAGNOSIS — G40109 Localization-related (focal) (partial) symptomatic epilepsy and epileptic syndromes with simple partial seizures, not intractable, without status epilepticus: Secondary | ICD-10-CM | POA: Diagnosis not present

## 2023-08-20 DIAGNOSIS — N1831 Chronic kidney disease, stage 3a: Secondary | ICD-10-CM | POA: Diagnosis not present

## 2023-09-14 DIAGNOSIS — D631 Anemia in chronic kidney disease: Secondary | ICD-10-CM | POA: Diagnosis not present

## 2023-09-14 DIAGNOSIS — R809 Proteinuria, unspecified: Secondary | ICD-10-CM | POA: Diagnosis not present

## 2023-09-14 DIAGNOSIS — N189 Chronic kidney disease, unspecified: Secondary | ICD-10-CM | POA: Diagnosis not present

## 2023-09-17 ENCOUNTER — Other Ambulatory Visit: Payer: Medicare HMO

## 2023-09-17 DIAGNOSIS — R972 Elevated prostate specific antigen [PSA]: Secondary | ICD-10-CM | POA: Diagnosis not present

## 2023-09-18 ENCOUNTER — Ambulatory Visit: Payer: Self-pay | Admitting: Urology

## 2023-09-18 LAB — PSA: Prostate Specific Ag, Serum: 7.3 ng/mL — ABNORMAL HIGH (ref 0.0–4.0)

## 2023-09-21 DIAGNOSIS — I129 Hypertensive chronic kidney disease with stage 1 through stage 4 chronic kidney disease, or unspecified chronic kidney disease: Secondary | ICD-10-CM | POA: Diagnosis not present

## 2023-09-21 DIAGNOSIS — N2581 Secondary hyperparathyroidism of renal origin: Secondary | ICD-10-CM | POA: Diagnosis not present

## 2023-09-21 DIAGNOSIS — N1832 Chronic kidney disease, stage 3b: Secondary | ICD-10-CM | POA: Diagnosis not present

## 2023-09-21 DIAGNOSIS — E1129 Type 2 diabetes mellitus with other diabetic kidney complication: Secondary | ICD-10-CM | POA: Diagnosis not present

## 2023-10-03 ENCOUNTER — Ambulatory Visit: Payer: Medicare HMO | Admitting: Urology

## 2023-10-03 VITALS — BP 149/67 | HR 57

## 2023-10-03 DIAGNOSIS — R350 Frequency of micturition: Secondary | ICD-10-CM | POA: Diagnosis not present

## 2023-10-03 DIAGNOSIS — N138 Other obstructive and reflux uropathy: Secondary | ICD-10-CM | POA: Diagnosis not present

## 2023-10-03 DIAGNOSIS — N401 Enlarged prostate with lower urinary tract symptoms: Secondary | ICD-10-CM

## 2023-10-03 DIAGNOSIS — R972 Elevated prostate specific antigen [PSA]: Secondary | ICD-10-CM

## 2023-10-03 LAB — URINALYSIS, ROUTINE W REFLEX MICROSCOPIC
Bilirubin, UA: NEGATIVE
Glucose, UA: NEGATIVE
Ketones, UA: NEGATIVE
Leukocytes,UA: NEGATIVE
Nitrite, UA: NEGATIVE
Protein,UA: NEGATIVE
RBC, UA: NEGATIVE
Specific Gravity, UA: 1.015 (ref 1.005–1.030)
Urobilinogen, Ur: 1 mg/dL (ref 0.2–1.0)
pH, UA: 7.5 (ref 5.0–7.5)

## 2023-10-03 MED ORDER — TERAZOSIN HCL 2 MG PO CAPS: 2.0000 mg | ORAL_CAPSULE | Freq: Every day | ORAL | 3 refills | Status: AC

## 2023-10-03 NOTE — Progress Notes (Signed)
 10/03/2023 10:40 AM   Brendan Scott 09-Sep-1945 [id number]  Referring provider: Marvine Rush, MD [address] [place],  [place] [address]  Followup elevated PSA and BPH   HPI: Brendan Scott is a 78yo here for followup for elevated PSA and BPH with urinary frequency. PSA increased to 7.3 from 3.2. He denies any dysuria or prostatitis symptoms. IPSS 10 QOL 2 on terazosin  2mg  daily. Urine stream strong. No straining to urinate. Nocturia 1x. Uirnary frequency every 2-4 hours.  no worsening LUTS   PMH: Past Medical History:  Diagnosis Date   Aortic atherosclerosis (HCC)    Arthritis    CKD (chronic kidney disease), stage III (HCC)    Coronary artery calcification    Diabetes mellitus    HTN (hypertension)    Hyperlipidemia    Permanent atrial fibrillation (HCC)    Seizure disorder (HCC)    Stroke [facility])     Surgical History: Past Surgical History:  Procedure Laterality Date   TOTAL KNEE ARTHROPLASTY     Bilateral    Home Medications:  Allergies as of 10/03/2023   No Known Allergies      Medication List        Accurate as of October 03, 2023 10:40 AM. If you have any questions, ask your nurse or doctor.          amLODipine  10 MG tablet Commonly known as: NORVASC  Take 10 mg by mouth daily.   atorvastatin  40 MG tablet Commonly known as: LIPITOR TAKE 1 TABLET BY MOUTH ONCE A DAY.   chlorthalidone 25 MG tablet Commonly known as: HYGROTON Take 12.5 mg by mouth daily.   glimepiride 2 MG tablet Commonly known as: AMARYL Take 3 tablets by mouth daily with breakfast.   HYDROcodone -acetaminophen  5-325 MG tablet Commonly known as: NORCO/VICODIN Take 1 tablet by mouth every 6 (six) hours as needed for moderate pain.   losartan 100 MG tablet Commonly known as: COZAAR Take 1 tablet by mouth daily.   sodium bicarbonate 650 MG tablet Take 1 tablet by mouth in the morning, at noon, and at bedtime.   terazosin  2 MG capsule Commonly known as: HYTRIN  Take 1  capsule (2 mg total) by mouth at bedtime.   Xarelto  15 MG Tabs tablet Generic drug: Rivaroxaban  TAKE 1 TABLET BY MOUTH DAILY WITH SUPPER        Allergies: No Known Allergies  Family History: Family History  Problem Relation Age of Onset   Arthritis Other    Diabetes Other     Social History:  reports that he quit smoking about 45 years ago. His smoking use included cigarettes. He started smoking about 60 years ago. He has never used smokeless tobacco. He reports that he does not drink alcohol  and does not use drugs.  ROS: All other review of systems were reviewed and are negative except what is noted above in HPI  Physical Exam: BP (!) 149/67   Pulse (!) 57   Constitutional:  Alert and oriented, No acute distress. HEENT: [place] AT, moist mucus membranes.  Trachea midline, no masses. Cardiovascular: No clubbing, cyanosis, or edema. Respiratory: Normal respiratory effort, no increased work of breathing. GI: Abdomen is soft, nontender, nondistended, no abdominal masses GU: No CVA tenderness.  Lymph: No cervical or inguinal lymphadenopathy. Skin: No rashes, bruises or suspicious lesions. Neurologic: Grossly intact, no focal deficits, moving all 4 extremities. Psychiatric: Normal mood and affect.  Laboratory Data: Lab Results  Component Value Date   WBC 5.7 11/16/2020  HGB 13.5 11/16/2020   HCT 43.4 11/16/2020   MCV 86.3 11/16/2020   PLT 156 11/16/2020    Lab Results  Component Value Date   CREATININE 2.06 (H) 11/16/2020    No results found for: PSA  No results found for: TESTOSTERONE  Lab Results  Component Value Date   HGBA1C 6.9 (H) 12/16/2011    Urinalysis    Component Value Date/Time   COLORURINE YELLOW 12/24/2011 1200   APPEARANCEUR Clear 09/22/2022 0953   LABSPEC 1.016 12/24/2011 1200   PHURINE 8.5 (H) 12/24/2011 1200   GLUCOSEU Negative 09/22/2022 0953   HGBUR NEGATIVE 12/24/2011 1200   BILIRUBINUR Negative 09/22/2022 0953   KETONESUR  NEGATIVE 12/24/2011 1200   PROTEINUR Negative 09/22/2022 0953   PROTEINUR NEGATIVE 12/24/2011 1200   UROBILINOGEN 1.0 12/24/2011 1200   NITRITE Negative 09/22/2022 0953   NITRITE NEGATIVE 12/24/2011 1200   LEUKOCYTESUR Negative 09/22/2022 0953    Lab Results  Component Value Date   LABMICR See below: 09/22/2022   WBCUA None seen 09/22/2022   LABEPIT 0-10 09/22/2022   MUCUS Present 12/17/2020   BACTERIA None seen 09/22/2022    Pertinent Imaging:  No results found for this or any previous visit.  No results found for this or any previous visit.  No results found for this or any previous visit.  No results found for this or any previous visit.  Results for orders placed during the hospital encounter of 07/29/15  US  Renal  Narrative CLINICAL DATA:  Chronic renal insufficiency stage III, history of diabetes, atrial fibrillation  EXAM: RENAL / URINARY TRACT ULTRASOUND COMPLETE  COMPARISON:  None in PACs  FINDINGS: Right Kidney:  Length: 10.8 cm. The renal cortical echotexture is mildly increased but remains lower than that of the adjacent liver. There is no parenchymal mass or hydronephrosis.  Left Kidney:  Length: 10.3 cm. The renal cortical echotexture is similar to that on the right. There is no focal mass or hydronephrosis.  Bladder:  Appears normal for degree of bladder distention. Bilateral ureteral jets are observed.  IMPRESSION: Mild increased renal cortical echotexture consistent with medical renal disease. There is no renal mass or hydronephrosis.   Electronically Signed By: Brendan  Scott M.D. On: 07/29/2015 12:51  No results found for this or any previous visit.  No results found for this or any previous visit.  No results found for this or any previous visit.   Assessment & Plan:    1. Elevated PSA (Primary) IsoPSA today, will call with results - Urinalysis, Routine w reflex microscopic  2. BPH with obstruction/lower urinary tract  symptoms Continue terazosin   3. Urinary frequency Continue terazosin    No follow-ups on file.  Brendan Clara, MD  [facility] [place]

## 2023-10-04 ENCOUNTER — Ambulatory Visit: Payer: Self-pay | Admitting: Internal Medicine

## 2023-10-04 ENCOUNTER — Ambulatory Visit (HOSPITAL_COMMUNITY)
Admission: RE | Admit: 2023-10-04 | Discharge: 2023-10-04 | Disposition: A | Discharge status: Home / Self Care | Payer: Medicare HMO | Source: Ambulatory Visit | Attending: Internal Medicine | Admitting: Internal Medicine

## 2023-10-04 DIAGNOSIS — I340 Nonrheumatic mitral (valve) insufficiency: Secondary | ICD-10-CM | POA: Diagnosis not present

## 2023-10-04 DIAGNOSIS — I517 Cardiomegaly: Secondary | ICD-10-CM

## 2023-10-04 LAB — ECHOCARDIOGRAM COMPLETE

## 2023-10-04 NOTE — Progress Notes (Signed)
*  PRELIMINARY RESULTS* Echocardiogram 2D Echocardiogram has been performed.  Teresa Aida PARAS 10/04/2023, 1:57 PM

## 2023-10-08 ENCOUNTER — Other Ambulatory Visit (HOSPITAL_COMMUNITY): Payer: Self-pay | Admitting: Neurosurgery

## 2023-10-08 DIAGNOSIS — D329 Benign neoplasm of meninges, unspecified: Secondary | ICD-10-CM

## 2023-10-09 ENCOUNTER — Encounter: Payer: Self-pay | Admitting: Urology

## 2023-10-09 DIAGNOSIS — M79675 Pain in left toe(s): Secondary | ICD-10-CM | POA: Diagnosis not present

## 2023-10-09 DIAGNOSIS — L84 Corns and callosities: Secondary | ICD-10-CM | POA: Diagnosis not present

## 2023-10-09 DIAGNOSIS — B351 Tinea unguium: Secondary | ICD-10-CM | POA: Diagnosis not present

## 2023-10-09 DIAGNOSIS — M79674 Pain in right toe(s): Secondary | ICD-10-CM | POA: Diagnosis not present

## 2023-10-09 DIAGNOSIS — E1142 Type 2 diabetes mellitus with diabetic polyneuropathy: Secondary | ICD-10-CM | POA: Diagnosis not present

## 2023-10-09 NOTE — Patient Instructions (Signed)

## 2023-10-10 NOTE — Telephone Encounter (Signed)
-----   Message from Vishnu P Mallipeddi sent at 10/04/2023  4:30 PM EDT ----- Normal LV function, indeterminate diastology, mildly reduced RV function, severe dilatation of bilateral atrium, no valvular heart disease.  Abnormal GLS with apical sparing concerning for cardiac  amyloidosis.  Patient refused PYP scan (does not want to drive to [facility]).  See if he is willing to, this time.  Obtain SPEP, UPEP (cardiac amyloid screening), if patient is willing. ----- Message ----- From: Interface, Three One Seven Sent: 10/04/2023   4:19 PM EDT To: Vishnu P Mallipeddi, MD

## 2023-10-10 NOTE — Telephone Encounter (Signed)
 I spoke with wife and she stated patient would be wiling to have lab/urine done at [facility]  He has MRI tomorrow and will get labs done then.

## 2023-10-11 ENCOUNTER — Ambulatory Visit (HOSPITAL_COMMUNITY)
Admission: RE | Admit: 2023-10-11 | Discharge: 2023-10-11 | Disposition: A | Discharge status: Home / Self Care | Source: Ambulatory Visit | Attending: Neurosurgery | Admitting: Neurosurgery

## 2023-10-11 DIAGNOSIS — D329 Benign neoplasm of meninges, unspecified: Secondary | ICD-10-CM | POA: Diagnosis not present

## 2023-10-11 MED ORDER — GADOBUTROL 1 MMOL/ML IV SOLN
10.0000 mL | Freq: Once | INTRAVENOUS | Status: AC | PRN
  Administered 2023-10-11: 10 mL via INTRAVENOUS

## 2023-10-15 ENCOUNTER — Other Ambulatory Visit (HOSPITAL_COMMUNITY)
Admission: RE | Admit: 2023-10-15 | Discharge: 2023-10-15 | Disposition: A | Discharge status: Home / Self Care | Source: Ambulatory Visit | Attending: Internal Medicine | Admitting: Internal Medicine

## 2023-10-15 ENCOUNTER — Other Ambulatory Visit (HOSPITAL_COMMUNITY)
Admission: RE | Admit: 2023-10-15 | Discharge: 2023-10-15 | Disposition: A | Discharge status: Home / Self Care | Source: Ambulatory Visit | Attending: Urology | Admitting: Urology

## 2023-10-15 DIAGNOSIS — R972 Elevated prostate specific antigen [PSA]: Secondary | ICD-10-CM | POA: Diagnosis not present

## 2023-10-15 DIAGNOSIS — I517 Cardiomegaly: Secondary | ICD-10-CM | POA: Insufficient documentation

## 2023-10-16 LAB — PSA, TOTAL AND FREE
PSA, Free Pct: 36.5 %
PSA, Free: 1.79 ng/mL
Prostate Specific Ag, Serum: 4.9 ng/mL — ABNORMAL HIGH (ref 0.0–4.0)

## 2023-10-16 LAB — POCT I-STAT CREATININE: Creatinine, Ser: 2.2 mg/dL — ABNORMAL HIGH (ref 0.61–1.24)

## 2023-10-17 LAB — PROTEIN ELECTROPHORESIS, SERUM
A/G Ratio: 1.1 (ref 0.7–1.7)
Albumin ELP: 3.4 g/dL (ref 2.9–4.4)
Alpha-1-Globulin: 0.3 g/dL (ref 0.0–0.4)
Alpha-2-Globulin: 0.6 g/dL (ref 0.4–1.0)
Beta Globulin: 0.7 g/dL (ref 0.7–1.3)
Gamma Globulin: 1.4 g/dL (ref 0.4–1.8)
Globulin, Total: 3 g/dL (ref 2.2–3.9)
M-Spike, %: 0.8 g/dL — ABNORMAL HIGH
Total Protein ELP: 6.4 g/dL (ref 6.0–8.5)

## 2023-10-18 LAB — UPEP/UIFE/LIGHT CHAINS/TP, 24-HR UR
% BETA, Urine: 21.3 %
ALPHA 1 URINE: 5.6 %
Albumin, U: 35 %
Alpha 2, Urine: 18.9 %
Free Kappa Lt Chains,Ur: 11 mg/L (ref 1.17–86.46)
Free Kappa/Lambda Ratio: 4.14 (ref 1.83–14.26)
Free Lambda Lt Chains,Ur: 2.66 mg/L (ref 0.27–15.21)
GAMMA GLOBULIN URINE: 19.3 %
Total Protein, Urine-Ur/day: 76 mg/(24.h) (ref 30–150)
Total Protein, Urine: 8.4 mg/dL
Total Volume: 900

## 2023-10-23 ENCOUNTER — Telehealth: Payer: Self-pay

## 2023-10-23 ENCOUNTER — Ambulatory Visit: Payer: Self-pay | Admitting: Urology

## 2023-10-23 ENCOUNTER — Ambulatory Visit: Attending: Internal Medicine | Admitting: Internal Medicine

## 2023-10-23 ENCOUNTER — Other Ambulatory Visit: Payer: Self-pay | Admitting: Internal Medicine

## 2023-10-23 ENCOUNTER — Encounter: Payer: Self-pay | Admitting: Internal Medicine

## 2023-10-23 ENCOUNTER — Telehealth: Payer: Self-pay | Admitting: Internal Medicine

## 2023-10-23 ENCOUNTER — Ambulatory Visit: Attending: Internal Medicine

## 2023-10-23 VITALS — BP 118/60 | HR 44 | Ht 66.0 in | Wt 209.4 lb

## 2023-10-23 DIAGNOSIS — I480 Paroxysmal atrial fibrillation: Secondary | ICD-10-CM

## 2023-10-23 DIAGNOSIS — Z01810 Encounter for preprocedural cardiovascular examination: Secondary | ICD-10-CM

## 2023-10-23 DIAGNOSIS — R0609 Other forms of dyspnea: Secondary | ICD-10-CM | POA: Diagnosis not present

## 2023-10-23 DIAGNOSIS — E854 Organ-limited amyloidosis: Secondary | ICD-10-CM

## 2023-10-23 DIAGNOSIS — I43 Cardiomyopathy in diseases classified elsewhere: Secondary | ICD-10-CM

## 2023-10-23 DIAGNOSIS — I4821 Permanent atrial fibrillation: Secondary | ICD-10-CM | POA: Diagnosis not present

## 2023-10-23 DIAGNOSIS — R0600 Dyspnea, unspecified: Secondary | ICD-10-CM | POA: Diagnosis not present

## 2023-10-23 DIAGNOSIS — R972 Elevated prostate specific antigen [PSA]: Secondary | ICD-10-CM

## 2023-10-23 NOTE — Telephone Encounter (Signed)
  1. What is your concern with the heart monitor? Pt spouse wanted clarification on how far he can go away from the monitor please advise

## 2023-10-23 NOTE — Telephone Encounter (Signed)
 Patient called and made aware of Iso Psa results and Dr. Little recommendations to f/u in 6 months with a PSA.

## 2023-10-23 NOTE — Telephone Encounter (Signed)
Checking percert on the following patient for    LONG TERM MONITOR

## 2023-10-23 NOTE — Patient Instructions (Addendum)
 Medication Instructions:  Your physician recommends that you continue on your current medications as directed. Please refer to the Current Medication list given to you today.   Labwork: None  Testing/Procedures: Your physician has requested that you have a lexiscan  myoview . For further information please visit [email or web address] Please follow instruction sheet, as given.  Your physician has recommended that you wear a Zio monitor.   This monitor is a medical device that records the heart's electrical activity. Doctors most often use these monitors to diagnose arrhythmias. Arrhythmias are problems with the speed or rhythm of the heartbeat. The monitor is a small device applied to your chest. You can wear one while you do your normal daily activities. While wearing this monitor if you have any symptoms to push the button and record what you felt. Once you have worn this monitor for the period of time provider prescribed (for 14 days), you will return the monitor device in the postage paid box. Once it is returned they will download the data collected and provide us  with a report which the provider will then review and we will call you with those results. Important tips:  Avoid showering during the first 24 hours of wearing the monitor. Avoid excessive sweating to help maximize wear time. Do not submerge the device, no hot tubs, and no swimming pools. Keep any lotions or oils away from the patch. After 24 hours you may shower with the patch on. Take brief showers with your back facing the shower head.  Do not remove patch once it has been placed because that will interrupt data and decrease adhesive wear time. Push the button when you have any symptoms and write down what you were feeling. Once you have completed wearing your monitor, remove and place into box which has postage paid and place in your outgoing mailbox.  If for some reason you have misplaced your box then call our office and we can  provide another box and/or mail it off for you.     You are scheduled for Cardiac MRI at the location below.  Please arrive for your appointment at ______________ . ?  [facility] [address] [place], [place] [address] Please take advantage of the free valet parking available at the [facility] and [employer] (Entrance C).  Proceed to the [facility] Radiology Department (First Floor) for check-in.    Magnetic resonance imaging (MRI) is a painless test that produces images of the inside of the body without using Xrays.  During an MRI, strong magnets and radio waves work together in a Data processing manager to form detailed images.   MRI images may provide more details about a medical condition than X-rays, CT scans, and ultrasounds can provide.  You may be given earphones to listen for instructions.  You may eat a light breakfast and take medications as ordered with the exception of furosemide, hydrochlorothiazide, chlorthalidone or spironolactone (or any other fluid pill). If you are undergoing a stress MRI, please avoid stimulants for 12 hr prior to test. (I.e. Caffeine, nicotine, chocolate, or antihistamine medications)  If your provider has ordered anti-anxiety medications for this test, then you will need a driver.  An IV will be inserted into one of your veins. Contrast material will be injected into your IV. It will leave your body through your urine within a day. You may be told to drink plenty of fluids to help flush the contrast material out of your system.  You will be asked to remove  all metal, including: Watch, jewelry, and other metal objects including hearing aids, hair pieces and dentures. Also wearable glucose monitoring systems (ie. Freestyle Libre and Omnipods) (Braces and fillings normally are not a problem.)   TEST WILL TAKE APPROXIMATELY 1 HOUR  PLEASE NOTIFY SCHEDULING AT LEAST 24 HOURS IN ADVANCE IF YOU ARE UNABLE TO KEEP YOUR  APPOINTMENT. [phone]  For more information and frequently asked questions, please visit our website : [email or web address]  Please call the Cardiac Imaging Nurse Navigators with any questions/concerns. [phone] Office    Follow-Up: Your physician recommends that you schedule a follow-up appointment in: 3 months  Any Other Special Instructions Will Be Listed Below (If Applicable).  If you need a refill on your cardiac medications before your next appointment, please call your pharmacy.

## 2023-10-23 NOTE — Progress Notes (Signed)
 Cardiology Office Note  Date: 10/23/2023   ID: Brendan Scott, DOB 06/11/1945, MRN [id number]  PCP:  [facility]  Cardiologist:  Diannah SHAUNNA Maywood, MD Electrophysiologist:  None   History of Present Illness: Brendan Scott is a 78 y.o. male known to have permanent A-fib, HTN, HLD, CKD stage IIIb is here for follow-up visit.  Accompanied by wife.  METs more than 4, active at baseline.  Wife reported that she noticed him getting short winded with exertional activities for the last couple of years.  Patient acknowledged that.  She also reported he making rattling noises at night during sleep.  Patient denied.  He does not snore or have nighttime apneas according to the wife.  He does not have any angina, dizziness, lightheadedness, syncope, palpitations, leg swelling.  Past Medical History:  Diagnosis Date   Aortic atherosclerosis    Arthritis    CKD (chronic kidney disease), stage III (HCC)    Coronary artery calcification    Diabetes mellitus    HTN (hypertension)    Hyperlipidemia    Permanent atrial fibrillation (HCC)    Seizure disorder (HCC)    Stroke (HCC)     Past Surgical History:  Procedure Laterality Date   TOTAL KNEE ARTHROPLASTY     Bilateral    Current Outpatient Medications  Medication Sig Dispense Refill   atorvastatin  (LIPITOR) 40 MG tablet TAKE 1 TABLET BY MOUTH ONCE A DAY. 90 tablet 3   chlorthalidone (HYGROTON) 25 MG tablet Take 12.5 mg by mouth daily.     cholecalciferol (VITAMIN D3) 25 MCG (1000 UNIT) tablet Take 1,000 Units by mouth daily.     glimepiride (AMARYL) 2 MG tablet Take 3 tablets by mouth daily with breakfast.     HYDROcodone -acetaminophen  (NORCO/VICODIN) 5-325 MG per tablet Take 1 tablet by mouth every 6 (six) hours as needed for moderate pain.     losartan (COZAAR) 100 MG tablet Take 1 tablet by mouth daily.     Omega-3 Fatty Acids (FISH OIL) 1000 MG CAPS Take 1 capsule by mouth daily.     Rivaroxaban  (XARELTO ) 15 MG TABS  tablet TAKE 1 TABLET BY MOUTH DAILY WITH SUPPER 30 tablet 5   sodium bicarbonate 650 MG tablet Take 1 tablet by mouth in the morning, at noon, and at bedtime.     terazosin  (HYTRIN ) 2 MG capsule Take 1 capsule (2 mg total) by mouth at bedtime. 90 capsule 3   amLODipine  (NORVASC ) 10 MG tablet Take 10 mg by mouth daily. (Patient not taking: Reported on 10/23/2023)     No current facility-administered medications for this visit.   Allergies:  Patient has no known allergies.   Social History: The patient  reports that he quit smoking about 45 years ago. His smoking use included cigarettes. He started smoking about 60 years ago. He has never used smokeless tobacco. He reports that he does not drink alcohol  and does not use drugs.   Family History: The patient's family history includes Arthritis in an other family member; Diabetes in an other family member.   ROS:  Please see the history of present illness. Otherwise, complete review of systems is positive for none.  All other systems are reviewed and negative.   Physical Exam: VS:  BP 118/60   Pulse (!) 44   Ht 5' 6 (1.676 m)   Wt 209 lb 6.4 oz (95 kg)   SpO2 99%   BMI 33.80 kg/m , BMI Body mass index is  33.8 kg/m.  Wt Readings from Last 3 Encounters:  10/23/23 209 lb 6.4 oz (95 kg)  10/04/22 216 lb (98 kg)  11/21/21 213 lb 12.8 oz (97 kg)    General: Patient appears comfortable at rest. HEENT: Conjunctiva and lids normal, oropharynx clear with moist mucosa. Neck: Supple, no elevated JVP or carotid bruits, no thyromegaly. Lungs: Clear to auscultation, nonlabored breathing at rest. Cardiac: Regular rate and rhythm, no S3 or significant systolic murmur, no pericardial rub. Abdomen: Soft, nontender, no hepatomegaly, bowel sounds present, no guarding or rebound. Extremities: No pitting edema, distal pulses 2+. Skin: Warm and dry. Musculoskeletal: No kyphosis. Neuropsychiatric: Alert and oriented x3, affect grossly appropriate.  Recent  Labwork: 10/11/2023: Creatinine, Ser 2.20     Component Value Date/Time   CHOL 184 12/16/2011 0430   TRIG 51 12/16/2011 0430   HDL 60 12/16/2011 0430   CHOLHDL 3.1 12/16/2011 0430   VLDL 10 12/16/2011 0430   LDLCALC 114 (H) 12/16/2011 0430     Assessment and Plan:   DOE: Chronic stable DOE for the last couple of years according to the wife.  Patient acknowledged.  No orthopnea, PND, leg swelling.  He has severe left knee arthritis.  Not a candidate for exercise Myoview .  Obtain Lexiscan .  Permanent A-fib: EKG today showed A-fib with slow ventricular response.  HR 44 bpm.  Not on AV nodal agents.  He does not have dizziness/lightheadedness, syncope or severe fatigue.  However he does endorse DOE with exertional activities.  Ideally he will benefit from ETT to assess for chronotropic incompetence however he he has severe left knee arthritis.  Will obtain 2-week event monitor, live instead.  Continue Xarelto  15 mg daily with supper.  Cardiac amyloidosis: Echo in 2025 showed abnormal GLS with apical sparing concerning for cardiac amyloidosis.  SPEP/UPEP within normal limits.  Previously refused to drive to [facility] to get PYP scan or cardiac MRI.  Now both patient and wife agreeable to drive to [place] with the help of their granddaughter.  Will go ahead and order cardiac MRI.  History of embolic stroke to MCA in 2013: He has history of permanent A-fib.  Continue Xarelto  50 mg daily with supper.  Mild to moderate MR, functional: Trivial MR on the recent echocardiogram.  CKD stage IIIb: Stable.  Follows with PCP/nephrology.  HTN, controlled: Continue current antihypertensives.  Management per PCP.  HLD, at goal: Continue atorvastatin  40 mg nightly.   30 minutes spent in review prior records, imaging, test/reports, labs, discussion of the above problems with the patient and his wife, documentation and answering other questions.  Medication Adjustments/Labs and Tests Ordered: Current  medicines are reviewed at length with the patient today.  Concerns regarding medicines are outlined above.    Disposition:  Follow up 3 months  Signed, Ilya Neely Arleta Maywood, MD, 10/23/2023 8:52 AM    [facility] at [facility] [address] [place], [place] [address]

## 2023-10-23 NOTE — Telephone Encounter (Signed)
 Spoke with wife regarding monitor. Wanted to know how far he had to be from the device. Advised that the device needed to be with him at all times at least within 10 feet. Advised that if they have any further questions regarding the monitor will need to call [employer]. Wife verbalized understanding.

## 2023-10-23 NOTE — Telephone Encounter (Signed)
-----   Message from Brendan Scott sent at 10/23/2023  8:46 AM EDT ----- 6 months PSA ----- Message ----- From: Malachy Slice, LPN Sent: [id number]/[id number]   9:10 AM EDT To: Brendan LITTIE Clara, MD  OV notes states IsoPSA today, will call with results

## 2023-10-24 ENCOUNTER — Telehealth: Payer: Self-pay | Admitting: Internal Medicine

## 2023-10-24 NOTE — Telephone Encounter (Signed)
 Danielle from [place] called states that patient had 2 episodes of slow Afib: First one 39 bpm lasting  90 seconds at 12:48 a.m and second 40bpm lasting also 90 seconds at 5:400 a.m. Advised her that will send to provided for further review.

## 2023-10-24 NOTE — Telephone Encounter (Signed)
 [employer] calling with urgent abnormal EKG reading. Please advise.

## 2023-10-24 NOTE — Telephone Encounter (Signed)
 Call transferred

## 2023-10-25 ENCOUNTER — Telehealth: Payer: Self-pay | Admitting: Internal Medicine

## 2023-10-25 DIAGNOSIS — I480 Paroxysmal atrial fibrillation: Secondary | ICD-10-CM | POA: Diagnosis not present

## 2023-10-25 NOTE — Telephone Encounter (Signed)
 [name] from [place] for reading of slow a-fib HR 38 bpm lasting 60 seconds at 3:42 a.m. Advised her that will route to provider for review.

## 2023-10-25 NOTE — Telephone Encounter (Signed)
 Caller ([name]) is reporting abnormal results.

## 2023-10-26 ENCOUNTER — Telehealth: Payer: Self-pay | Admitting: Internal Medicine

## 2023-10-26 NOTE — Telephone Encounter (Signed)
 Patient notified of result.  Please refer to phone note from today for complete details.   Littie CHRISTELLA Croak, CMA 10/26/2023 9:38 AM

## 2023-10-26 NOTE — Telephone Encounter (Signed)
 Calling with abn results from monitor

## 2023-10-26 NOTE — Telephone Encounter (Signed)
 Spoke with [name] from [place] with abnormal results. Slow Afib 39 bpm lasting 60 seconds at 12:03 a.m. Advised [name] of Dr.Mallipeddi's message: Can we change the parameters and let i[employer] know to notify us  if there are any abnormal results in the wakeful hours only.   She states that she will fax over for to fill out and sign to change.

## 2023-10-27 ENCOUNTER — Telehealth: Payer: Self-pay | Admitting: Physician Assistant

## 2023-10-27 NOTE — Telephone Encounter (Signed)
 [employer] call received:  Autotrigger for slow Afib with HR 40 bpm lasting 90 sec at 0805 today.  0806 Afib 40 bpm lasting 60 sec.   I called the patient, he is asymptomatic.   Jon Nat Hails, PA-C 10/27/2023, 11:44 AM [phone] [facility] [address] [place], [place] [address]

## 2023-10-29 ENCOUNTER — Encounter: Payer: Self-pay | Admitting: Internal Medicine

## 2023-11-06 ENCOUNTER — Encounter (HOSPITAL_COMMUNITY): Payer: Self-pay

## 2023-11-06 LAB — HEMOGLOBIN AND HEMATOCRIT, BLOOD
Hematocrit: 42.5 % (ref 37.5–51.0)
Hemoglobin: 13 g/dL (ref 13.0–17.7)

## 2023-11-07 ENCOUNTER — Ambulatory Visit (HOSPITAL_COMMUNITY)
Admission: RE | Admit: 2023-11-07 | Discharge: 2023-11-07 | Disposition: A | Discharge status: Home / Self Care | Source: Ambulatory Visit | Attending: Internal Medicine | Admitting: Internal Medicine

## 2023-11-07 ENCOUNTER — Other Ambulatory Visit: Payer: Self-pay | Admitting: Physician Assistant

## 2023-11-07 ENCOUNTER — Encounter (HOSPITAL_COMMUNITY): Payer: Self-pay

## 2023-11-07 DIAGNOSIS — R0600 Dyspnea, unspecified: Secondary | ICD-10-CM | POA: Diagnosis present

## 2023-11-07 MED ORDER — SODIUM CHLORIDE FLUSH 0.9 % IV SOLN
INTRAVENOUS | Status: AC
  Administered 2023-11-07: 10 mL via INTRAVENOUS
  Filled 2023-11-07: qty 10

## 2023-11-07 MED ORDER — TECHNETIUM TC 99M TETROFOSMIN IV KIT
10.0000 | PACK | Freq: Once | INTRAVENOUS | Status: AC | PRN
  Administered 2023-11-07: 9.9 via INTRAVENOUS

## 2023-11-07 MED ORDER — TECHNETIUM TC 99M TETROFOSMIN IV KIT
30.0000 | PACK | Freq: Once | INTRAVENOUS | Status: AC | PRN
  Administered 2023-11-07: 30.3 via INTRAVENOUS

## 2023-11-07 MED ORDER — REGADENOSON 0.4 MG/5ML IV SOLN
INTRAVENOUS | Status: AC
  Administered 2023-11-07: 0.4 mg via INTRAVENOUS
  Filled 2023-11-07: qty 5

## 2023-11-07 NOTE — Progress Notes (Signed)
     Brendan Scott presented for a Lexiscan  nuclear stress test today.  I Lorette CINDERELLA Kapur, PA-C, provided direct supervision and was present during the stress portion of the study today, which was completed without significant symptoms, immediate complications, or acute ST/T changes on ECG.  Stress imaging is pending at this time.  Preliminary ECG findings may be listed in the chart, but the stress test result will not be finalized until perfusion imaging is complete.  Lorette CINDERELLA Kapur, PA-C  11/07/2023, 9:21 AM

## 2023-11-08 ENCOUNTER — Other Ambulatory Visit

## 2023-11-08 DIAGNOSIS — R972 Elevated prostate specific antigen [PSA]: Secondary | ICD-10-CM

## 2023-11-08 LAB — NM MYOCAR MULTI W/SPECT W/WALL MOTION / EF
Base ST Depression (mm): 0 mm
LV dias vol: 150 mL (ref 62–150)
LV sys vol: 71 mL (ref 4.2–5.8)
MPHR: 142 {beats}/min
Nuc Stress EF: 53 %
Peak HR: 82 {beats}/min
Percent HR: 57 %
RATE: 0.4
Rest HR: 45 {beats}/min
Rest Nuclear Isotope Dose: 9.9 mCi
SDS: 4
SRS: 1
SSS: 5
ST Depression (mm): 0 mm
Stress Nuclear Isotope Dose: 30.3 mCi
TID: 1.01

## 2023-11-09 ENCOUNTER — Ambulatory Visit: Payer: Self-pay | Admitting: Internal Medicine

## 2023-11-09 ENCOUNTER — Telehealth: Payer: Self-pay | Admitting: Internal Medicine

## 2023-11-09 LAB — PSA: Prostate Specific Ag, Serum: 6.6 ng/mL — ABNORMAL HIGH (ref 0.0–4.0)

## 2023-11-09 NOTE — Telephone Encounter (Signed)
 Pt wife calling to say they have received a new heart monitor in the mail. She is confused about this because she mailed in the last one on 10/7 and was not expecting a new one. Please advise.

## 2023-11-09 NOTE — Telephone Encounter (Signed)
 Returned patient call and spoke with spouse. Patient was wearing a Zio monitor and somehow received an extra one. This RN suggested that they follow up with the patient's general cardiologist office in [place] to make sure he doesn't need to still wear one as we do not deal with them here in the device clinic in [place].

## 2023-11-13 NOTE — Telephone Encounter (Signed)
 Spoke to pt's spouse who stated that they had sent monitor back to [facility]. Pt's spouse had no further concerns or questions at this time.

## 2023-11-15 ENCOUNTER — Other Ambulatory Visit

## 2023-11-19 ENCOUNTER — Telehealth: Payer: Self-pay | Admitting: Internal Medicine

## 2023-11-19 NOTE — Telephone Encounter (Signed)
 No one answer and [employer] disconnected. Please see below. Please advise

## 2023-11-19 NOTE — Telephone Encounter (Signed)
 Calling with abnormal findings. Please advise

## 2023-11-19 NOTE — Telephone Encounter (Signed)
 Brady 39-41 bpm for 60 secs.  Sep 25th at 6:49 pm     Cardiac Monitor Alert  Date of alert:  11/19/2023   Patient Name: Brendan Scott  DOB: January 14, 1946  MRN: [id number]   [facility] Cardiologist: Diannah SHAUNNA Maywood, MD  [facility] EP:  None    Monitor Information: Long Term Monitor [ZioXT]  Reason:  A fib  Ordering provider:  Vishnu Mallipeddi     Alert Bradycardia - slowest HR: 39 This is the 1st alert for this rhythm.   Next Cardiology Appointment   Date:  12/23  Provider:  V Mallipeddi   The patient was contacted today.  He is asymptomatic. Arrhythmia, symptoms and history reviewed with Dr. Dorn Lesches.   Plan:  No orders given   Other: Routed to ordering provider.   Akin Yi M Tracy Kinner, RN  11/19/2023 2:43 PM

## 2023-11-21 ENCOUNTER — Encounter (HOSPITAL_COMMUNITY): Payer: Self-pay

## 2023-11-23 ENCOUNTER — Other Ambulatory Visit: Payer: Self-pay | Admitting: Internal Medicine

## 2023-11-23 ENCOUNTER — Ambulatory Visit (HOSPITAL_COMMUNITY)
Admission: RE | Admit: 2023-11-23 | Discharge: 2023-11-23 | Disposition: A | Discharge status: Home / Self Care | Source: Ambulatory Visit | Attending: Internal Medicine | Admitting: Internal Medicine

## 2023-11-23 DIAGNOSIS — E854 Organ-limited amyloidosis: Secondary | ICD-10-CM | POA: Insufficient documentation

## 2023-11-23 DIAGNOSIS — I43 Cardiomyopathy in diseases classified elsewhere: Secondary | ICD-10-CM | POA: Diagnosis present

## 2023-11-23 DIAGNOSIS — I517 Cardiomegaly: Secondary | ICD-10-CM

## 2023-11-23 MED ORDER — GADOBUTROL 1 MMOL/ML IV SOLN
10.0000 mL | Freq: Once | INTRAVENOUS | Status: AC | PRN
  Administered 2023-11-23: 10 mL via INTRAVENOUS

## 2023-12-03 ENCOUNTER — Ambulatory Visit: Payer: Self-pay | Admitting: Internal Medicine

## 2023-12-05 ENCOUNTER — Telehealth: Payer: Self-pay | Admitting: Internal Medicine

## 2023-12-05 NOTE — Telephone Encounter (Signed)
 Called patient wife stated that Granddaughter was no longer there. Patient expressed that he didn't want to go at this time. Will address symptoms and treatment at next visit. Advised wife Designated Party Release will need to be updated so she can speak on his behalf

## 2023-12-05 NOTE — Telephone Encounter (Signed)
-----   Message from Vishnu P Mallipeddi sent at 12/03/2023  3:54 PM EST ----- Findings consistent with cardiac amyloidosis.  Needs to be seen by advanced heart failure for the management of cardiac amyloidosis.  Is patient willing to drive to [facility] or [place] for HF  consultation?  If yes, please place referral.  If not, I will discuss with him importance of treating cardiac amyloidosis in the upcoming office visit. ----- Message ----- From: Interface, Rad Results In Sent: 11/26/2023  12:37 PM EST To: Vishnu P Mallipeddi, MD

## 2023-12-05 NOTE — Telephone Encounter (Signed)
 Pts granddaughter calling to inquire about scheduling heart failure clinic referral. States someone told pts wife, Brendan Scott, she could not schedule for him but she is on release form under documents. Please advise.

## 2023-12-05 NOTE — Telephone Encounter (Signed)
 The patient has been notified of the result and verbalized understanding.  All questions (if any) were answered. Patient refused HF consult at this time. Will discuss further at next office visit Littie CHRISTELLA Croak, CMA 12/05/2023 11:02 AM

## 2023-12-14 DIAGNOSIS — I480 Paroxysmal atrial fibrillation: Secondary | ICD-10-CM | POA: Diagnosis not present

## 2023-12-18 ENCOUNTER — Encounter: Payer: Self-pay | Admitting: Internal Medicine

## 2023-12-24 ENCOUNTER — Ambulatory Visit: Payer: Self-pay | Admitting: Internal Medicine

## 2023-12-24 NOTE — Telephone Encounter (Signed)
 Pt returning call. Please advise.

## 2024-01-22 ENCOUNTER — Ambulatory Visit: Admitting: Internal Medicine

## 2024-01-31 NOTE — Progress Notes (Unsigned)
 " Cardiology Office Note:  .   Date:  02/01/2024  ID:  Brendan Scott, DOB 08-Jul-1945, MRN [id number] PCP: [facility] Providers Cardiologist:  Diannah SHAUNNA Maywood, MD {  History of Present Illness: .   Brendan Scott is a 79 y.o. 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   Pertinent cardiac medical history:  Permanent atrial fibrillation Heart monitor 10/2023: 100% A-fib burden, average HR 58, 3 brief runs of NSVT longest 7 beats. Cardiac amyloidosis Echo in 2025 showed abnormal GLS with apical sparing concerning for cardiac amyloidosis.  SPEP/UPEP within normal limits.  Cardiac MRI 11/23/2023: C/W cardiac amyloidosis.  Previously refused to drive to [facility] to get PYP scan.  Mitral regurgitation Echo 2023: EF 60 to 65%, mild to moderate regurgitation Echo 10/2023: EF 55 to 60%, mildly reduced RV dysfunction, severely dilated LA, severely dilated RA, trivial MV regurgitation Lexiscan  11/07/2023: Intermediate risk, C/W mid inferior wall ischemia and no infarction  Last seen in heartcare 10/23/2023 by Dr. Mallipeddi for follow up.  Reported chronic stable DOE and rattling noise at night during sleep.  Reported not taking amlodipine .  Continued Lipitor 40 mg nightly, chlorthalidone 25 mg daily, losartan 100 mg daily, Xarelto  15 mg nightly. Follow-up Abnormal Lexiscan  as above.  Follow-up heart monitor as above.  Follow-up cardiac MRI as above to evaluate for cardiac amyloidosis.  Recommended referral to the advanced heart failure.  Today, accompanied by wife. Reports improvement in SOB with using albuterol. He gets some SOB with pulling trash can outside up the hill but resolves with albuterol. Denies chest pain, palpitations, syncope, presyncope, dizziness, orthopnea, PND, swelling or significant weight changes, acute bleeding, or claudication.   Reports compliance with medications. He is able to walk around grocery store without any concerns. Denies tobacco use/alcohol /drug use.  Denies any recent hospitalizations or visits to the emergency department. Patient is willing to go to [facility] for further testing and referral to HF.   ROS: 10 point review of system has been reviewed and considered negative except ones been listed in the HPI.   Studies Reviewed: .   Heart monitor 10/2023   Patch wear time was for 14 days.   100% A-fib burden, ranging from 36 to 125 bpm with an average HR 50 bpm.   3 runs of NSVT, fastest interval lasting 4 beats and the longest interval lasting 7 beats.   No evidence of high-grade AV block or pauses. <1% PVC burden.   Patient triggered events correlated with A-fib (38 to 60 bpm).  Lexiscan  11/07/2023 Narrative & Impression      No ST deviation was noted. Pharmacological protocol is used.   LV perfusion is abnormal. There is evidence of ischemia. There is no evidence of infarction. There is a small reversible perfusion defect with moderate reduction in uptake present in the mid inferior wall with normal wall motion in the defect area consistent with ischemia.   Left ventricular function is abnormal. Global function is mildly reduced. Nuclear stress EF: 53%.   Findings are consistent with ischemia and no infarction. The study is intermediate risk.    Cardiac MRI 11/23/2023 IMPRESSION: 1. Severe concentric hypertrophy with biventricular hypertrophy, bi-atrial dilation, myocardial mass index of 101 g/m2, ECV elevation and LGE pattern consistent with cardiac amyloidosis.   2. Mild decrease in left ventricular systolic function (LVEF =45%). Possible  abnormal mid, septal perfusion at rest. Consider stress testing if clinically indicated.   3. Dilated right ventricular size with RVEDVI 82 mL/m2 but basal RVEDD 46 mm and mild dilation of the main pulmonary artery, 29 mm.  CV Studies: Cardiac studies reviewed are  outlined and summarized above. Otherwise please see EMR for full report.   Physical Exam:   VS:  BP 126/62   Pulse (!) 55   Ht 5' 5 (1.651 m)   Wt 210 lb (95.3 kg)   SpO2 99%   BMI 34.95 kg/m    Wt Readings from Last 3 Encounters:  02/01/24 210 lb (95.3 kg)  10/23/23 209 lb 6.4 oz (95 kg)  10/04/22 216 lb (98 kg)    GEN: Well nourished, well developed in no acute distress while sitting in chair. Accompanied by wife.  NECK: No JVD; No carotid bruits CARDIAC: RRR, no murmurs, rubs, gallops RESPIRATORY:  Clear to auscultation without rales, wheezing or rhonchi  ABDOMEN: Soft, non-tender, non-distended EXTREMITIES:  No edema; No deformity   ASSESSMENT AND PLAN: .   DOE (dyspnea on exertion) Reviewed Lexiscan  as above. Order NTG PRN. Discussed ED precautions.  Reports significant improvement in SOB with using albuterol but has been well controlled. Denies angina, LE edema, orthopnea, PND. Euvolemic on exam.   Permanent atrial fibrillation (HCC) Reviewed EKG 10/2023: A-fib with slow ventricular response, HR 44 On exam noted irregularly irregular.  Denies palpitations, dizziness, or chest discomfort. No recurrent dark stools or bleeding. On AC with CBC WNL. Continue on Xarelto  15 mg nightly. (dose appropriate with CrCl 37)   Not on AV nodal agents.     Latest Ref Rng & Units 11/05/2023    8:17 AM 11/16/2020   10:17 AM 01/19/2012    6:00 AM  CBC  WBC 4.0 - 10.5 K/uL  5.7  7.1   Hemoglobin 13.0 - 17.7 g/dL 86.9  86.4  85.0   Hematocrit 37.5 - 51.0 % 42.5  43.4  46.0   Platelets 150 - 400 K/uL  156  200     Cardiac amyloidosis (HCC) Confirmed by echo (apical sparing) and cardiac MRI consistent with infiltrative cardiomyopathy. SPEP/UPEP normal. Order PYP scan.  Refer to Advanced Heart Failure/Amyloid clinic for initiation of disease-modifying therapy. Anticipate initiation of tafamidis pending diagnostic confirmation. Avoid digoxin and non-DHP calcium  channel blockers. Monitor  symptoms, volume status, biomarkers, and serial echocardiography.  Primary hypertension BP this OV well controlled today: 126/62 Continue on chlorthalidone 25 mg daily, losartan 100 mg daily. Encourage physical activity for 150 minutes per week and heart healthy low sodium diet. Discussed limiting sodium intake to < 2 grams daily.    Hyperlipidemia LDL goal <70 LDL 46 10/2023 via [facility] DXA.  Continue Lipitor 40 mg nightly Lipid Panel     Component Value Date/Time   CHOL 184 12/16/2011 0430   TRIG 51 12/16/2011 0430   HDL 60 12/16/2011 0430   CHOLHDL 3.1 12/16/2011 0430   VLDL 10 12/16/2011 0430   LDLCALC 114 (H) 12/16/2011 0430     Mitral valve insufficiency, unspecified etiology Continue to monitor  Stage 3a chronic kidney disease (HCC) Continue to follow-up with nephrology Dr. Rachele every 3 months     Latest Ref Rng & Units 10/11/2023   11:26 AM 11/16/2020   10:17 AM 08/17/2017    3:46 PM  BMP  Glucose 70 - 99 mg/dL  856  892   BUN 8 - 23 mg/dL  29  17   Creatinine 9.38 -  1.24 mg/dL 7.79  7.93  8.39   Sodium 135 - 145 mmol/L  136  138   Potassium 3.5 - 5.1 mmol/L  4.1  4.4   Chloride 98 - 111 mmol/L  103  108   CO2 22 - 32 mmol/L  25  23   Calcium  8.9 - 10.3 mg/dL  9.5  9.4     Cerebrovascular accident (CVA) due to embolism of middle cerebral artery, unspecified blood vessel laterality (HCC) Continue on Xarelto  as above.  Dispo: Refer to HF for cardiac amyloidosis management. Follow up with VM or Scottie in 3 months.    Signed, Lorette CINDERELLA Kapur, PA-C  "

## 2024-02-01 ENCOUNTER — Ambulatory Visit: Attending: Physician Assistant | Admitting: Physician Assistant

## 2024-02-01 ENCOUNTER — Encounter: Payer: Self-pay | Admitting: Physician Assistant

## 2024-02-01 VITALS — BP 126/62 | HR 55 | Ht 65.0 in | Wt 210.0 lb

## 2024-02-01 DIAGNOSIS — E785 Hyperlipidemia, unspecified: Secondary | ICD-10-CM

## 2024-02-01 DIAGNOSIS — I10 Essential (primary) hypertension: Secondary | ICD-10-CM

## 2024-02-01 DIAGNOSIS — I517 Cardiomegaly: Secondary | ICD-10-CM

## 2024-02-01 DIAGNOSIS — I43 Cardiomyopathy in diseases classified elsewhere: Secondary | ICD-10-CM | POA: Diagnosis not present

## 2024-02-01 DIAGNOSIS — I340 Nonrheumatic mitral (valve) insufficiency: Secondary | ICD-10-CM | POA: Diagnosis not present

## 2024-02-01 DIAGNOSIS — I63419 Cerebral infarction due to embolism of unspecified middle cerebral artery: Secondary | ICD-10-CM

## 2024-02-01 DIAGNOSIS — N1831 Chronic kidney disease, stage 3a: Secondary | ICD-10-CM

## 2024-02-01 DIAGNOSIS — I4821 Permanent atrial fibrillation: Secondary | ICD-10-CM | POA: Diagnosis not present

## 2024-02-01 DIAGNOSIS — R0609 Other forms of dyspnea: Secondary | ICD-10-CM

## 2024-02-01 DIAGNOSIS — E854 Organ-limited amyloidosis: Secondary | ICD-10-CM | POA: Diagnosis not present

## 2024-02-01 DIAGNOSIS — R9431 Abnormal electrocardiogram [ECG] [EKG]: Secondary | ICD-10-CM | POA: Diagnosis not present

## 2024-02-01 MED ORDER — NITROGLYCERIN 0.4 MG SL SUBL: 0.4000 mg | SUBLINGUAL_TABLET | SUBLINGUAL | 3 refills | Status: AC | PRN

## 2024-02-01 NOTE — Patient Instructions (Addendum)
 Medication Instructions:  Take nitroglycerin as needed for Chest Pain, refer to instructions below  *If you need a refill on your cardiac medications before your next appointment, please call your pharmacy*  Lab Work: None today If you have labs (blood work) drawn today and your tests are completely normal, you will receive your results only by: MyChart Message (if you have MyChart) OR A paper copy in the mail If you have any lab test that is abnormal or we need to change your treatment, we will call you to review the results.  Testing/Procedures: Schedule Cardiac PYP scan  A PYP scan, or pyrophosphate scan, is a non-invasive nuclear medicine test, primarily used to diagnose and evaluate transthyretin cardiac amyloidosis (ATTR-CM), a serious heart condition where abnormal proteins (amyloid) build up in the heart muscle, causing stiffness and heart failure. The scan involves injecting a radioactive tracer (Technetium-38m PYP) into a vein, which is then absorbed by amyloid deposits, allowing special cameras to capture detailed images showing the extent of the disease.   Follow-Up: At [facility], you and your health needs are our priority.  As part of our continuing mission to provide you with exceptional heart care, our providers are all part of one team.  This team includes your primary Cardiologist (physician) and Advanced Practice Providers or APPs (Physician Assistants and Nurse Practitioners) who all work together to provide you with the care you need, when you need it.  Your next appointment:   3 month(s)  Provider:   You may see Vishnu P Mallipeddi, MD or one of the following Advanced Practice Providers on your designated Care Team:   Brittany Strader, PA-C  Lorette Kapur, PA-C  We recommend signing up for the patient portal called MyChart.  Sign up information is provided on this After Visit Summary.  MyChart is used to connect with patients for Virtual Visits  (Telemedicine).  Patients are able to view lab/test results, encounter notes, upcoming appointments, etc.  Non-urgent messages can be sent to your provider as well.   To learn more about what you can do with MyChart, go to [email or web address].   Other Instructions Referral placed to Advanced Heart Failure Clinic in [place]. They will call you to schedule an appointment        Nitroglycerin Sublingual Tablets What is this medication? NITROGLYCERIN (nye troe GLI ser in) prevents and treats chest pain (angina). It works by relaxing blood vessels, which decreases the amount of work the heart has to do. It belongs to a group of medications called nitrates. How should I use this medication? Take this medication by mouth as needed. Use at the first sign of an angina attack (chest pain or tightness). You can also take this medication 5 to 10 minutes before an event likely to produce chest pain. Follow the directions exactly as written on the prescription label. Place one tablet under your tongue and let it dissolve. Do not swallow whole. Replace the dose if you accidentally swallow it. It will help if your mouth is not dry. Saliva around the tablet will help it to dissolve more quickly. Do not eat or drink, smoke or chew tobacco while a tablet is dissolving. Sit down when taking this medication. In an angina attack, you should feel better within 5 minutes after your first dose. You can take a dose every 5 minutes up to a total of 3 doses. If you do not feel better or feel worse after 1 dose, call 9-1-1 at once. Do not take  more than 3 doses in 15 minutes. Your care team might give you other directions. Follow those directions if they do. Do not take your medication more often than directed.

## 2024-04-17 ENCOUNTER — Other Ambulatory Visit

## 2024-04-21 ENCOUNTER — Ambulatory Visit: Admitting: Urology

## 2024-05-02 ENCOUNTER — Ambulatory Visit: Admitting: Internal Medicine

## 2024-09-22 ENCOUNTER — Other Ambulatory Visit

## 2024-10-01 ENCOUNTER — Ambulatory Visit: Admitting: Urology
